# Patient Record
Sex: Male | Born: 1945 | Race: White | Hispanic: No | Marital: Married | Smoking: Never smoker
Health system: Southern US, Community
[De-identification: ages and names within clinical notes are randomized; demographics above are authoritative.]

## PROBLEM LIST (undated history)

## (undated) ENCOUNTER — Inpatient Hospital Stay: Admission: EM | Payer: Self-pay | Source: Home / Self Care

## (undated) DIAGNOSIS — M109 Gout, unspecified: Secondary | ICD-10-CM

## (undated) DIAGNOSIS — M1990 Unspecified osteoarthritis, unspecified site: Secondary | ICD-10-CM

## (undated) DIAGNOSIS — I499 Cardiac arrhythmia, unspecified: Secondary | ICD-10-CM

## (undated) DIAGNOSIS — M549 Dorsalgia, unspecified: Secondary | ICD-10-CM

## (undated) DIAGNOSIS — L0390 Cellulitis, unspecified: Secondary | ICD-10-CM

## (undated) DIAGNOSIS — C189 Malignant neoplasm of colon, unspecified: Secondary | ICD-10-CM

## (undated) DIAGNOSIS — N189 Chronic kidney disease, unspecified: Secondary | ICD-10-CM

## (undated) DIAGNOSIS — C9110 Chronic lymphocytic leukemia of B-cell type not having achieved remission: Secondary | ICD-10-CM

## (undated) DIAGNOSIS — I4891 Unspecified atrial fibrillation: Secondary | ICD-10-CM

## (undated) DIAGNOSIS — R7881 Bacteremia: Secondary | ICD-10-CM

## (undated) DIAGNOSIS — G8929 Other chronic pain: Secondary | ICD-10-CM

## (undated) DIAGNOSIS — I10 Essential (primary) hypertension: Secondary | ICD-10-CM

## (undated) DIAGNOSIS — M14679 Charcot's joint, unspecified ankle and foot: Secondary | ICD-10-CM

## (undated) DIAGNOSIS — M19079 Primary osteoarthritis, unspecified ankle and foot: Secondary | ICD-10-CM

## (undated) HISTORY — PX: CYSTOSCOPY WITH URETHRAL DILATATION: SHX5125

## (undated) HISTORY — PX: COLON SURGERY: SHX602

## (undated) HISTORY — DX: Chronic lymphocytic leukemia of B-cell type not having achieved remission: C91.10

## (undated) HISTORY — PX: BACK SURGERY: SHX140

## (undated) HISTORY — PX: TONSILLECTOMY: SUR1361

## (undated) HISTORY — PX: DIALYSIS FISTULA CREATION: SHX611

## (undated) HISTORY — DX: Gout, unspecified: M10.9

## (undated) HISTORY — PX: FOOT SURGERY: SHX648

---

## 2003-10-07 ENCOUNTER — Ambulatory Visit (HOSPITAL_COMMUNITY): Admission: RE | Admit: 2003-10-07 | Discharge: 2003-10-07 | Payer: Self-pay | Admitting: Internal Medicine

## 2004-03-05 ENCOUNTER — Encounter: Admission: RE | Admit: 2004-03-05 | Discharge: 2004-03-05 | Payer: Self-pay | Admitting: Orthopaedic Surgery

## 2004-03-17 ENCOUNTER — Encounter (INDEPENDENT_AMBULATORY_CARE_PROVIDER_SITE_OTHER): Payer: Self-pay | Admitting: Specialist

## 2004-03-17 ENCOUNTER — Inpatient Hospital Stay (HOSPITAL_COMMUNITY): Admission: RE | Admit: 2004-03-17 | Discharge: 2004-03-18 | Payer: Self-pay | Admitting: Neurosurgery

## 2005-11-04 ENCOUNTER — Ambulatory Visit (HOSPITAL_COMMUNITY): Admission: RE | Admit: 2005-11-04 | Discharge: 2005-11-04 | Payer: Self-pay | Admitting: Ophthalmology

## 2008-07-22 ENCOUNTER — Ambulatory Visit (HOSPITAL_COMMUNITY): Admission: RE | Admit: 2008-07-22 | Discharge: 2008-07-22 | Payer: Self-pay | Admitting: Nephrology

## 2010-05-29 NOTE — Op Note (Signed)
NAMELANDAN, FEDIE NO.:  [id number]   MEDICAL RECORD NO.:  [id number]          PATIENT TYPE:  INP   LOCATION:  2899                         FACILITY:  [facility]   PHYSICIAN:  Clydene Fake, M.D.  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   OPERATION PERFORMED:  Redo decompression of lamina, foraminotomy at L5-S1,  left side, microdissection with microscope, decompressive laminectomy at L3-  L4, microdissection with microscope.   SURGEON:  Clydene Fake, M.D.   ASSISTANT:  Danae Orleans. Venetia Maxon, M.D.   ANESTHESIA:  General endotracheal.   ESTIMATED BLOOD LOSS:  250 mL.   BLOOD REPLACED:  None.   DRAINS:  None.   COMPLICATIONS:  None.   INDICATIONS FOR PROCEDURE:  Patient is a 65 year old gentleman who has had a  few month history of severe back pain radiating to his legs worse on the  left side but also to the right associated with some weakness.  MRI was done  showing severe stenosis at 3-4 with a large disk herniation and spondylitic  changes and some enhancement around some of this disk.  With his history of  colon cancer and chronic intermittent cellulitis/__________  those  etiologies needed to be ruled out.  Also with chronic numbness top of the  foot on the left with severe left foraminal narrowing at 5-1.  Patient  brought in for decompression at all of the above levels.   DESCRIPTION OF PROCEDURE:  The patient was brought to the operating room.  General anesthesia induced.  The patient was placed in the prone position on  the Wilson frame and all pressure points padded.  The patient was prepped  and draped in  sterile fashion.  The site of incision was injected with 20 cc  of 1% lidocaine with epinephrine. An incision was then made at the side of  previous scar which was extended cephalad.  Incision was made on the fascia,  hemostasis was obtained with Bovie cauterization on the left side.  Subperiosteal dissection was done over the L3, 4, and 5 and ___________  facets markers were placed at the 3-4 space, one marker above and one marker  at level below that just to make sure of the markers and x-rays obtained  showing the middle marker was at the 3-4 interspace.  We then  did  subperiosteal dissection of the right side over L3 and 4 ___________  out to  facets.  Self-retaining retractor was placed exposing the 3-4 area.  The  retractor was placed over the 5-1 area on the left.  Laminectomy was then  performed with Leksell rongeurs, high speed drill and Kerrison punches at 3-  4 removing bottom half of the L3 spinous process and lamina, medial facets  and the top of the L4 lamina  and spinous process decompressing the central  canal.  Explored at the disk space.  Large disk herniation was seen.  Subligamentous herniation under a very thickened ligament  that went up  cephalad disk herniation.  The disk space was incised with a 15 blade.  Diskectomy was performed with pituitary rongeurs and curets.  We swept  fragments that were behind the body of L3 down into the disk space and  removed them and diskectomy done to both sides getting good decompression of  the central canal and the bilateral 3 and 4 roots.  Microdissection and  microscope was used during the diskectomy.  Attention was taken to the left  side at L5 and S1 and as we dissected the area we noted that there was a  pars defect at the facet and a loose piece of bone right at the 5 root.  This was removed with a high speed drill and Kerrison punches after  loosening up from scar tissue with nerve hooks.  Kerrison punches were used  to  continue the laminectomy removing the L5 lamina decompression of the  central canal.  The lamina over the 5 root and foraminotomy was done of the  5 root as it came out.  We explored down to the 5-1 disk space and removed  the medial facet at 5-1 and some scar tissue, loosened up scar tissue at S1  root out to the S1 pedicle.  We did decompression there.  When we were  finished, we had good decompression of the S1 and L5 roots.  Wound was  irrigated with antibiotic solution.  Hemostasis was obtained with Gelfoam  and thrombin.  This was then irrigated out and we had good hemostasis.  Retractors were removed. The fascia closed with 0 Vicryl interrupted suture  and the subcutaneous tissue closed with 0, 2-0 and 3-0 Vicryl interrupted  suture and skin closed with Benzoin and Steri-Strips.  Dressing was placed.  Patient placed back to supine position, awakened from anesthesia,  transferred to recovery room in stable condition.      JRH/MEDQ  D:  03/17/2004  T:  03/17/2004  Job:  [id number]

## 2010-05-29 NOTE — Op Note (Signed)
NAME:  Alex Petty, Alex Petty             ACCOUNT NO.:  [id number]   MEDICAL RECORD NO.:  [id number]          PATIENT TYPE:  AMB   LOCATION:  DAY                           FACILITY:  [facility]   PHYSICIAN:  R. Roetta Sessions, M.D. DATE OF BIRTH:  [birth year]   DATE OF PROCEDURE:  10/07/2003  DATE OF DISCHARGE:                                 OPERATIVE REPORT   PROCEDURE:  Colonoscopy with biopsy.   INDICATION FOR PROCEDURE:  The patient is a 65 year old gentleman referred  out of the courtesy of Dr. Sherril Croon in [place] for colorectal cancer screening.  Mr. Goldring has never had a colonoscopy.  There is no family history of  colorectal neoplasia.  He tells me he has passed a small amount of blood on  occasion.  Colonoscopy is now being done primarily as a screening maneuver.  This approach has been discussed with the patient at length.  Potential  risks, benefits, and alternatives have been reviewed, questions answered.  He is agreeable.  Please see documentation in the medical record.   PROCEDURE NOTE:  O2 saturation, blood pressure, pulse, and respirations were  monitored throughout the entire procedure.   CONSCIOUS SEDATION:  Versed 3 mg IV, Demerol 75 mg IV in divided doses.   INSTRUMENT USED:  Olympus video chip system.   FINDINGS:  Digital exam revealed no abnormalities.   ENDOSCOPIC FINDINGS:  Prep was good.   Rectum:  Examination of the rectal mucosa, including retroflexed view of the  anal verge, revealed no abnormalities.   Colon:  The colonic mucosa was surveyed from the rectosigmoid junction  through the left, transverse, right colon, to the area of the appendiceal  orifice, ileocecal valve, and cecum.  These structures were well-seen and  photographed for the record.  The scope was slowly withdrawn and all  previously-mentioned mucosal surfaces were once again seen.   BEGINNING AT 25 CM, THERE WAS A 5 X 6 CM SEMILUNAR EXOPHYTIC-APPEARING  NEOPLASTIC PROCESS (6 cm in longitudinal  length, please see photos).  It was  hard and biopsy pieces came off in large chunks.  The remainder of the  colonic mucosa appeared normal except for scattered sigmoid diverticula.  The patient tolerated the procedure well and was reacted in endoscopy.   IMPRESSION:  1.  Normal rectum.  2.  Sigmoid diverticula.  3.  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  4.  Remainder of colonic mucosa appeared normal.   RECOMMENDATIONS:  1.  CEA, CBC, CHEM-20.  Follow up on biopsies.  2.  Surgical consultation.      RMR/MEDQ  D:  10/07/2003  T:  10/07/2003  Job:  [id number]   cc:   Doreen Beam  [address]  [place]  [place] [address]  Fax: [phone]

## 2010-10-15 ENCOUNTER — Encounter: Payer: Self-pay | Admitting: *Deleted

## 2010-10-15 ENCOUNTER — Inpatient Hospital Stay (HOSPITAL_COMMUNITY)
Admission: EM | Admit: 2010-10-15 | Discharge: 2010-10-19 | DRG: 603 | Disposition: A | Discharge status: Home / Self Care | Payer: Medicare Other | Attending: Internal Medicine | Admitting: Internal Medicine

## 2010-10-15 DIAGNOSIS — N183 Chronic kidney disease, stage 3 unspecified: Secondary | ICD-10-CM | POA: Diagnosis present

## 2010-10-15 DIAGNOSIS — M1990 Unspecified osteoarthritis, unspecified site: Secondary | ICD-10-CM

## 2010-10-15 DIAGNOSIS — N179 Acute kidney failure, unspecified: Secondary | ICD-10-CM | POA: Diagnosis present

## 2010-10-15 DIAGNOSIS — Z85038 Personal history of other malignant neoplasm of large intestine: Secondary | ICD-10-CM

## 2010-10-15 DIAGNOSIS — L02419 Cutaneous abscess of limb, unspecified: Principal | ICD-10-CM | POA: Diagnosis present

## 2010-10-15 DIAGNOSIS — I890 Lymphedema, not elsewhere classified: Secondary | ICD-10-CM

## 2010-10-15 DIAGNOSIS — A5211 Tabes dorsalis: Secondary | ICD-10-CM | POA: Diagnosis present

## 2010-10-15 DIAGNOSIS — I872 Venous insufficiency (chronic) (peripheral): Secondary | ICD-10-CM | POA: Diagnosis present

## 2010-10-15 DIAGNOSIS — I129 Hypertensive chronic kidney disease with stage 1 through stage 4 chronic kidney disease, or unspecified chronic kidney disease: Secondary | ICD-10-CM | POA: Diagnosis present

## 2010-10-15 DIAGNOSIS — Z6841 Body Mass Index (BMI) 40.0 and over, adult: Secondary | ICD-10-CM

## 2010-10-15 DIAGNOSIS — N19 Unspecified kidney failure: Secondary | ICD-10-CM

## 2010-10-15 DIAGNOSIS — L03116 Cellulitis of left lower limb: Secondary | ICD-10-CM

## 2010-10-15 HISTORY — DX: Unspecified osteoarthritis, unspecified site: M19.90

## 2010-10-15 HISTORY — DX: Chronic kidney disease, unspecified: N18.9

## 2010-10-15 HISTORY — DX: Primary osteoarthritis, unspecified ankle and foot: M19.079

## 2010-10-15 HISTORY — DX: Malignant neoplasm of colon, unspecified: C18.9

## 2010-10-15 HISTORY — DX: Essential (primary) hypertension: I10

## 2010-10-15 HISTORY — DX: Cellulitis, unspecified: L03.90

## 2010-10-15 LAB — CBC
HCT: 40.3 % (ref 39.0–52.0)
MCV: 87 fL (ref 78.0–100.0)
Platelets: 140 10*3/uL — ABNORMAL LOW (ref 150–400)
RDW: 18.2 % — ABNORMAL HIGH (ref 11.5–15.5)

## 2010-10-15 LAB — BASIC METABOLIC PANEL
BUN: 38 mg/dL — ABNORMAL HIGH (ref 6–23)
CO2: 30 mEq/L (ref 19–32)
Creatinine, Ser: 2.68 mg/dL — ABNORMAL HIGH (ref 0.50–1.35)
GFR calc Af Amer: 27 mL/min — ABNORMAL LOW (ref >90)
Glucose, Bld: 134 mg/dL — ABNORMAL HIGH (ref 70–99)

## 2010-10-15 LAB — HEPATIC FUNCTION PANEL
Albumin: 3.6 g/dL (ref 3.5–5.2)
Bilirubin, Direct: 0.4 mg/dL — ABNORMAL HIGH (ref 0.0–0.3)
Total Bilirubin: 1.1 mg/dL (ref 0.3–1.2)

## 2010-10-15 LAB — DIFFERENTIAL
Eosinophils Relative: 0 % (ref 0–5)
Lymphocytes Relative: 15 % (ref 12–46)
Lymphs Abs: 1.9 10*3/uL (ref 0.7–4.0)
Monocytes Relative: 3 % (ref 3–12)
Neutro Abs: 10.6 10*3/uL — ABNORMAL HIGH (ref 1.7–7.7)
Neutrophils Relative %: 83 % — ABNORMAL HIGH (ref 43–77)

## 2010-10-15 LAB — URINALYSIS, ROUTINE W REFLEX MICROSCOPIC
Glucose, UA: NEGATIVE mg/dL
Hgb urine dipstick: NEGATIVE
Specific Gravity, Urine: >1.03 — ABNORMAL HIGH (ref 1.005–1.030)

## 2010-10-15 LAB — PHOSPHORUS: Phosphorus: 2.9 mg/dL (ref 2.3–4.6)

## 2010-10-15 LAB — PROTIME-INR: Prothrombin Time: 16.2 seconds — ABNORMAL HIGH (ref 11.6–15.2)

## 2010-10-15 LAB — APTT: aPTT: 35 seconds (ref 24–37)

## 2010-10-15 MED ORDER — VANCOMYCIN HCL IN DEXTROSE 1-5 GM/200ML-% IV SOLN
1000.0000 mg | Freq: Once | INTRAVENOUS | Status: AC
  Administered 2010-10-15: 1000 mg via INTRAVENOUS
  Filled 2010-10-15: qty 200

## 2010-10-15 MED ORDER — BISACODYL 10 MG RE SUPP: 10.0000 mg | RECTAL | Status: DC | PRN

## 2010-10-15 MED ORDER — SODIUM CHLORIDE 0.9 % IV SOLN
INTRAVENOUS | Status: DC
  Administered 2010-10-15 – 2010-10-17 (×3): via INTRAVENOUS
  Filled 2010-10-15 (×8): qty 1000

## 2010-10-15 MED ORDER — HYDROMORPHONE HCL 1 MG/ML IJ SOLN
0.5000 mg | INTRAMUSCULAR | Status: DC | PRN
  Administered 2010-10-15 (×2): 0.5 mg via INTRAVENOUS
  Filled 2010-10-15 (×2): qty 1

## 2010-10-15 MED ORDER — ENOXAPARIN SODIUM 30 MG/0.3ML ~~LOC~~ SOLN
30.0000 mg | Freq: Every day | SUBCUTANEOUS | Status: DC
  Administered 2010-10-15: 30 mg via SUBCUTANEOUS
  Filled 2010-10-15: qty 0.3

## 2010-10-15 MED ORDER — OXYCODONE HCL 5 MG PO TABS
5.0000 mg | ORAL_TABLET | ORAL | Status: DC | PRN
  Administered 2010-10-15 – 2010-10-18 (×3): 5 mg via ORAL
  Filled 2010-10-15 (×3): qty 1

## 2010-10-15 MED ORDER — FENOFIBRATE 54 MG PO TABS
54.0000 mg | ORAL_TABLET | Freq: Every day | ORAL | Status: DC
  Administered 2010-10-15 – 2010-10-19 (×5): 54 mg via ORAL
  Filled 2010-10-15 (×10): qty 1

## 2010-10-15 MED ORDER — FEBUXOSTAT 40 MG PO TABS
40.0000 mg | ORAL_TABLET | Freq: Every day | ORAL | Status: DC
  Administered 2010-10-15 – 2010-10-19 (×5): 40 mg via ORAL
  Filled 2010-10-15 (×7): qty 1

## 2010-10-15 MED ORDER — FOLIC ACID 1 MG PO TABS
1.0000 mg | ORAL_TABLET | Freq: Every day | ORAL | Status: DC
  Administered 2010-10-15 – 2010-10-19 (×5): 1 mg via ORAL
  Filled 2010-10-15 (×5): qty 1

## 2010-10-15 MED ORDER — POLYETHYLENE GLYCOL 3350 17 G PO PACK: 17.0000 g | PACK | Freq: Every day | ORAL | Status: DC | PRN

## 2010-10-15 MED ORDER — SODIUM CHLORIDE 0.9 % IJ SOLN
INTRAMUSCULAR | Status: AC
  Administered 2010-10-15: 15:00:00
  Filled 2010-10-15: qty 10

## 2010-10-15 MED ORDER — ACETAMINOPHEN 650 MG RE SUPP: 650.0000 mg | Freq: Four times a day (QID) | RECTAL | Status: DC | PRN

## 2010-10-15 MED ORDER — DILTIAZEM HCL ER COATED BEADS 120 MG PO CP24
120.0000 mg | ORAL_CAPSULE | Freq: Every day | ORAL | Status: DC
  Administered 2010-10-15 – 2010-10-17 (×3): 120 mg via ORAL
  Filled 2010-10-15 (×4): qty 1

## 2010-10-15 MED ORDER — TRAZODONE HCL 50 MG PO TABS: 25.0000 mg | ORAL_TABLET | Freq: Every evening | ORAL | Status: DC | PRN

## 2010-10-15 MED ORDER — DOXAZOSIN MESYLATE 2 MG PO TABS
4.0000 mg | ORAL_TABLET | Freq: Every day | ORAL | Status: DC
  Administered 2010-10-15 – 2010-10-18 (×4): 4 mg via ORAL
  Filled 2010-10-15 (×4): qty 2

## 2010-10-15 MED ORDER — ONDANSETRON HCL 4 MG PO TABS: 4.0000 mg | ORAL_TABLET | Freq: Four times a day (QID) | ORAL | Status: DC | PRN

## 2010-10-15 MED ORDER — ASPIRIN EC 81 MG PO TBEC
81.0000 mg | DELAYED_RELEASE_TABLET | Freq: Every day | ORAL | Status: DC
  Administered 2010-10-15 – 2010-10-19 (×5): 81 mg via ORAL
  Filled 2010-10-15 (×5): qty 1

## 2010-10-15 MED ORDER — ACETAMINOPHEN 325 MG PO TABS
650.0000 mg | ORAL_TABLET | Freq: Four times a day (QID) | ORAL | Status: DC | PRN
  Administered 2010-10-15 – 2010-10-19 (×2): 650 mg via ORAL
  Filled 2010-10-15 (×3): qty 2

## 2010-10-15 MED ORDER — PNEUMOCOCCAL VAC POLYVALENT 25 MCG/0.5ML IJ INJ
0.5000 mL | INJECTION | INTRAMUSCULAR | Status: AC
  Administered 2010-10-16: 0.5 mL via INTRAMUSCULAR
  Filled 2010-10-15: qty 0.5

## 2010-10-15 MED ORDER — PROMETHAZINE HCL 25 MG/ML IJ SOLN: 12.5000 mg | Freq: Four times a day (QID) | INTRAMUSCULAR | Status: DC | PRN

## 2010-10-15 MED ORDER — ONDANSETRON HCL 4 MG/2ML IJ SOLN
4.0000 mg | Freq: Four times a day (QID) | INTRAMUSCULAR | Status: DC | PRN
  Administered 2010-10-15 – 2010-10-18 (×3): 4 mg via INTRAVENOUS
  Filled 2010-10-15 (×3): qty 2

## 2010-10-15 MED ORDER — PIPERACILLIN-TAZOBACTAM 3.375 G IVPB
3.3750 g | Freq: Three times a day (TID) | INTRAVENOUS | Status: DC
  Administered 2010-10-15 – 2010-10-19 (×12): 3.375 g via INTRAVENOUS
  Filled 2010-10-15 (×13): qty 50

## 2010-10-15 MED ORDER — PROMETHAZINE HCL 12.5 MG PO TABS: 12.5000 mg | ORAL_TABLET | Freq: Four times a day (QID) | ORAL | Status: DC | PRN

## 2010-10-15 MED ORDER — TORSEMIDE 20 MG PO TABS
20.0000 mg | ORAL_TABLET | Freq: Two times a day (BID) | ORAL | Status: DC
  Administered 2010-10-15 – 2010-10-16 (×2): 20 mg via ORAL
  Filled 2010-10-15 (×2): qty 1

## 2010-10-15 MED ORDER — VANCOMYCIN HCL 1000 MG IV SOLR
1500.0000 mg | INTRAVENOUS | Status: DC
  Administered 2010-10-15 – 2010-10-18 (×4): 1500 mg via INTRAVENOUS
  Filled 2010-10-15 (×5): qty 1500

## 2010-10-15 MED ORDER — SODIUM CHLORIDE 0.9 % IV SOLN
INTRAVENOUS | Status: AC
  Administered 2010-10-15: 75 mL via INTRAVENOUS

## 2010-10-15 MED ORDER — FLORA-Q PO CAPS
2.0000 | ORAL_CAPSULE | Freq: Two times a day (BID) | ORAL | Status: DC
  Administered 2010-10-15 – 2010-10-19 (×9): 2 via ORAL
  Filled 2010-10-15 (×2): qty 2
  Filled 2010-10-15: qty 1
  Filled 2010-10-15 (×3): qty 2
  Filled 2010-10-15: qty 1
  Filled 2010-10-15 (×3): qty 2

## 2010-10-15 NOTE — ED Notes (Signed)
Pt has severe edema and redness on left lower leg.  Moderate edema on right.  Reports that he was to begin at wound care clinic, but PCP office hasn't scheduled yet.  Reports cramps in left leg, radiating up to groin area.  Reports only one episode of nausea, no vomiting.  Cap refill in toes <3 sec.  Pt did have one episode of diarrhea upon arriving to ED.  Reports he's been on Keflex for past 3 weeks.

## 2010-10-15 NOTE — ED Provider Notes (Signed)
History     CSN: [id number] Arrival date & time: 10/15/2010  4:34 AM  Chief Complaint  Patient presents with  . Leg Pain    (Consider location/radiation/quality/duration/timing/severity/associated sxs/prior treatment) HPI Comments: Patient is a pleasant 65 year old male with a history of hypertension, Charcot joints, recurrent cellulitis in his lower extremities who presents with several days of worsening redness and swelling in the left lower extremity. He states that he has just finished a prescription of Keflex medicine for an infection in that same leg. In the last 24 hours she has developed fevers and chills and has been sweating profusely. Symptoms are constant, gradually getting worse, nothing makes better or worse. He denies vomiting but has had nausea overnight.  Patient is a 65 y.o. male presenting with leg pain. The history is provided by the patient, the spouse and a relative.  Leg Pain     Past Medical History  Diagnosis Date  . Arthritis   . Hypertension   . Colon cancer     Past Surgical History  Procedure Date  . Back surgery   . Foot surgery     History reviewed. No pertinent family history.  History  Substance Use Topics  . Smoking status: Never Smoker   . Smokeless tobacco: Not on file  . Alcohol Use: No      Review of Systems  All other systems reviewed and are negative.    Allergies  Ace inhibitors; Beta adrenergic blockers; and Rocephin  Home Medications   Current Outpatient Rx  Name Route Sig Dispense Refill  . CHONDROITIN SULFATE 250 MG PO CAPS Oral Take by mouth.      . DICLOFENAC SODIUM 50 MG PO TBEC Oral Take 100 mg by mouth 2 (two) times daily.      Marland Kitchen DILTIAZEM HCL COATED BEADS 120 MG PO CP24 Oral Take 120 mg by mouth daily.      Marland Kitchen DOXAZOSIN MESYLATE 4 MG PO TABS Oral Take 4 mg by mouth at bedtime.      . FEBUXOSTAT 40 MG PO TABS Oral Take 40 mg by mouth daily.      . FENOFIBRATE 54 MG PO TABS Oral Take 54 mg by mouth daily.        Marland Kitchen GLUCOSAMINE 1500 COMPLEX PO Oral Take by mouth.      . INDOMETHACIN 50 MG PO CAPS Oral Take 50 mg by mouth 2 (two) times daily with a meal.      . MULTIVITAMINS PO CAPS Oral Take 1 capsule by mouth daily.      . TORSEMIDE 20 MG PO TABS Oral Take 20 mg by mouth daily.        BP 117/61  Pulse 87  Temp(Src) 99.1 F (37.3 C) (Oral)  Resp 20  Ht 6\' 3"  (1.905 m)  Wt 360 lb (163.295 kg)  BMI 45.00 kg/m2  SpO2 93%  Physical Exam  Nursing note and vitals reviewed. Constitutional: He appears well-developed and well-nourished.       Uncomfortable appearing  HENT:  Head: Normocephalic and atraumatic.  Mouth/Throat: Oropharynx is clear and moist.       Mucous membranes mildly dehydrated  Eyes: Conjunctivae and EOM are normal. Pupils are equal, round, and reactive to light. Right eye exhibits no discharge. Left eye exhibits no discharge. No scleral icterus.  Neck: Normal range of motion. Neck supple. No JVD present. No thyromegaly present.  Cardiovascular: Normal rate, regular rhythm, normal heart sounds and intact distal pulses.  Exam reveals no gallop  and no friction rub.   No murmur heard. Pulmonary/Chest: Effort normal and breath sounds normal. No respiratory distress. He has no wheezes. He has no rales.  Abdominal: Soft. Bowel sounds are normal. He exhibits no distension and no mass. There is no tenderness.  Musculoskeletal: He exhibits edema and tenderness (Left lower extremity redness, induration, swelling, warmth from the knee down. Erythema extends all the way to the hip).       Bilateral lower extremity edema left greater than right..  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  Neurological: He is alert. Coordination normal.  Skin: Skin is warm and dry. Rash noted. There is erythema.  Psychiatric: He has a normal mood and affect. His behavior is normal.    ED Course  Procedures (including critical care  time)     MDM  Significant cellulitis of the left lower extremity extending from the foot all the way up to the hip. Primarily involving the lower extremity below the knee. Temperature is 99.1 orally, pulse 87, blood pressure 117/61. Will start IV antibiotics and anticipate admission.  Discussed with Dr. Orvan Falconer of [facility] - the CBC shows elevated WBC and the BMP shows Cr of 2.5+.  Willa dmit.  Results for orders placed during the hospital encounter of 10/15/10  CBC      Component Value Range   WBC 12.8 (*) 4.0 - 10.5 (K/uL)   RBC 4.63  4.22 - 5.81 (MIL/uL)   Hemoglobin 12.8 (*) 13.0 - 17.0 (g/dL)   HCT 16.1  09.6 - 04.5 (%)   MCV 87.0  78.0 - 100.0 (fL)   MCH 27.6  26.0 - 34.0 (pg)   MCHC 31.8  30.0 - 36.0 (g/dL)   RDW 40.9 (*) 81.1 - 15.5 (%)   Platelets 140 (*) 150 - 400 (K/uL)  DIFFERENTIAL      Component Value Range   Neutrophils Relative 83 (*) 43 - 77 (%)   Neutro Abs 10.6 (*) 1.7 - 7.7 (K/uL)   Lymphocytes Relative 15  12 - 46 (%)   Lymphs Abs 1.9  0.7 - 4.0 (K/uL)   Monocytes Relative 3  3 - 12 (%)   Monocytes Absolute 0.3  0.1 - 1.0 (K/uL)   Eosinophils Relative 0  0 - 5 (%)   Eosinophils Absolute 0.0  0.0 - 0.7 (K/uL)   Basophils Relative 0  0 - 1 (%)   Basophils Absolute 0.0  0.0 - 0.1 (K/uL)  BASIC METABOLIC PANEL      Component Value Range   Sodium 138  135 - 145 (mEq/L)   Potassium 4.2  3.5 - 5.1 (mEq/L)   Chloride 98  96 - 112 (mEq/L)   CO2 30  19 - 32 (mEq/L)   Glucose, Bld 134 (*) 70 - 99 (mg/dL)   BUN 38 (*) 6 - 23 (mg/dL)   Creatinine, Ser 9.14 (*) 0.50 - 1.35 (mg/dL)   Calcium 8.9  8.4 - 78.2 (mg/dL)   GFR calc non Af Amer 23 (*) >90 (mL/min)   GFR calc Af Amer 27 (*) >90 (mL/min)   No results found.         Vida Roller, MD 10/15/10 [phone]

## 2010-10-15 NOTE — Consult Note (Signed)
Reason for Consult: Chronic renal failure Referring Physician: [facility]osptalist group  Alex Petty is an 65 y.o. male.  HPI: He is a patient with chronic renal failure baseline creatinine about 2.3 for the last 2 years and presently had came because of swelling of the legs and the also cellulitis. Patient was initially seen by his primary care physician and that he was treated with Keflex without much improvement. Presently patient complains of still swelling of the legs which has been there for many years secondary to live edema and also nonhealing cellulitis. At this moment patient doesn't have any nausea vomiting. He has occasional difficulty breathing mainly exertional. The etiology for his renal failure was thought to be a combination of nonsteroidal-induced renal failure class focal segmental nephrosclerosis related to kidney stone and possibly also obstructive uropathy related to urethral stricture. Since patient has elevated ANA double-stranded DNA was checked which was high however patient lost for followup he is now sure whether he has underlying lupus nephritis also.  Past Medical History  Diagnosis Date  . Arthritis   . Hypertension   . Colon cancer   . Chronic kidney disease   . DJD (degenerative joint disease)   . DJD (degenerative joint disease), ankle and foot   . Arthritis   . Cellulitis   . Arthritis     Past Surgical History  Procedure Date  . Back surgery   . Foot surgery   . Colon surgery   . Tonsillectomy   . Cystoscopy with urethral dilatation     History reviewed. No pertinent family history.  Social History:  reports that he has never smoked. He does not have any smokeless tobacco history on file. He reports that he does not drink alcohol or use illicit drugs.  Allergies:  Allergies  Allergen Reactions  . Ace Inhibitors Other (See Comments)    cough  . Beta Adrenergic Blockers Other (See Comments)    cough  . Rocephin (Ceftriaxone Sodium) Hives     Medications: I have reviewed the patient's current medications.  Results for orders placed during the hospital encounter of 10/15/10 (from the past 48 hour(s))  CBC     Status: Abnormal   Collection Time   10/15/10  4:54 AM      Component Value Range Comment   WBC 12.8 (*) 4.0 - 10.5 (K/uL)    RBC 4.63  4.22 - 5.81 (MIL/uL)    Hemoglobin 12.8 (*) 13.0 - 17.0 (g/dL)    HCT 82.9  56.2 - 13.0 (%)    MCV 87.0  78.0 - 100.0 (fL)    MCH 27.6  26.0 - 34.0 (pg)    MCHC 31.8  30.0 - 36.0 (g/dL)    RDW 86.5 (*) 78.4 - 15.5 (%)    Platelets 140 (*) 150 - 400 (K/uL)   DIFFERENTIAL     Status: Abnormal   Collection Time   10/15/10  4:54 AM      Component Value Range Comment   Neutrophils Relative 83 (*) 43 - 77 (%)    Neutro Abs 10.6 (*) 1.7 - 7.7 (K/uL)    Lymphocytes Relative 15  12 - 46 (%)    Lymphs Abs 1.9  0.7 - 4.0 (K/uL)    Monocytes Relative 3  3 - 12 (%)    Monocytes Absolute 0.3  0.1 - 1.0 (K/uL)    Eosinophils Relative 0  0 - 5 (%)    Eosinophils Absolute 0.0  0.0 - 0.7 (K/uL)  Basophils Relative 0  0 - 1 (%)    Basophils Absolute 0.0  0.0 - 0.1 (K/uL)   BASIC METABOLIC PANEL     Status: Abnormal   Collection Time   10/15/10  4:54 AM      Component Value Range Comment   Sodium 138  135 - 145 (mEq/L)    Potassium 4.2  3.5 - 5.1 (mEq/L)    Chloride 98  96 - 112 (mEq/L)    CO2 30  19 - 32 (mEq/L)    Glucose, Bld 134 (*) 70 - 99 (mg/dL)    BUN 38 (*) 6 - 23 (mg/dL)    Creatinine, Ser 1.61 (*) 0.50 - 1.35 (mg/dL)    Calcium 8.9  8.4 - 10.5 (mg/dL)    GFR calc non Af Amer 23 (*) >90 (mL/min)    GFR calc Af Amer 27 (*) >90 (mL/min)   HEPATIC FUNCTION PANEL     Status: Abnormal   Collection Time   10/15/10  4:54 AM      Component Value Range Comment   Total Protein 6.9  6.0 - 8.3 (g/dL)    Albumin 3.6  3.5 - 5.2 (g/dL)    AST 21  0 - 37 (U/L)    ALT 21  0 - 53 (U/L)    Alkaline Phosphatase 49  39 - 117 (U/L)    Total Bilirubin 1.1  0.3 - 1.2 (mg/dL)    Bilirubin,  Direct 0.4 (*) 0.0 - 0.3 (mg/dL)    Indirect Bilirubin 0.7  0.3 - 0.9 (mg/dL)   PHOSPHORUS     Status: Normal   Collection Time   10/15/10  4:54 AM      Component Value Range Comment   Phosphorus 2.9  2.3 - 4.6 (mg/dL)   APTT     Status: Normal   Collection Time   10/15/10  4:54 AM      Component Value Range Comment   aPTT 35  24 - 37 (seconds)   PROTIME-INR     Status: Abnormal   Collection Time   10/15/10  4:54 AM      Component Value Range Comment   Prothrombin Time 16.2 (*) 11.6 - 15.2 (seconds)    INR 1.27  0.00 - 1.49    MRSA PCR SCREENING     Status: Normal   Collection Time   10/15/10  8:18 AM      Component Value Range Comment   MRSA by PCR NEGATIVE  NEGATIVE      No results found.  Review of Systems  Constitutional: Positive for weight loss.  Respiratory: Negative for cough, sputum production and shortness of breath.   Cardiovascular: Positive for leg swelling. Negative for chest pain, orthopnea and PND.  Gastrointestinal: Negative for nausea, vomiting and abdominal pain.  Genitourinary: Positive for frequency. Negative for urgency.  Musculoskeletal: Positive for back pain.  Neurological: Positive for weakness.   Blood pressure 127/68, pulse 80, temperature 98.7 F (37.1 C), temperature source Oral, resp. rate 20, height 6\' 3"  (1.905 m), weight 163.295 kg (360 lb), SpO2 94.00%. Physical Exam  Constitutional: He is oriented to person, place, and time.  Eyes: No scleral icterus.  Neck: No JVD present.  Cardiovascular: Normal rate, regular rhythm and normal heart sounds.  Exam reveals no gallop.   No murmur heard. Respiratory: No respiratory distress. He has no wheezes.  GI: He exhibits distension. There is no guarding.  Neurological: He is alert and oriented to person, place, and time.  Skin: There is erythema. No pallor.    Assessment/Plan: Problem #1 renal failure chronic at this moment seems to be stable. Patient doesn't have any uremic sinus symptoms. The  etiology as stated above seems to be multifactorial including secondary to nonsteroidal[ Indocin] versus a focal segmental nephrosclerosis related to obstructive uropathy and kidney stone. Problem #2 history of cellulitis presently patient is on antibiotics Problem #3 history of  degenerative joint disease status post back surgery x2. Problem #4 history of gout patient has been treated with his disorder for many years and presently he has been taking indomethacin. 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  Recommendation I agree with the discontinuation of his indomethacin We'll repeat his ANA, anti-double-stranded DNA, and anti-Smith antibodies  We'll continue his diuretics We'll check her his present metabolic panel and follow patient. His renal function gets worse probably would do further workup.  Dayzha Pogosyan S 10/15/2010, 12:16 PM    and

## 2010-10-15 NOTE — Progress Notes (Signed)
Subjective: This man, who is morbidly obese, was admitted with left leg cellulitis and also renal failure. Unfortunately, we do not know his baseline creatinine but he does tell me he has seen Dr Alex Petty in the past.           Physical Exam: Blood pressure 127/68, pulse 80, temperature 98.7 F (37.1 C), temperature source Oral, resp. rate 20, height 6\' 3"  (1.905 m), weight 163.295 kg (360 lb), SpO2 94.00%. He looks systemically well. Is not toxic or septic. He is morbidly obese. He clearly has left leg cellulitis tracking up to his mid thigh on the left side. Heart sounds are present and normal without murmurs. Lung fields are clear. He is alert and orientated without any focal neurological signs.   Investigations: Results for orders placed during the hospital encounter of 10/15/10 (from the past 48 hour(s))  CBC     Status: Abnormal   Collection Time   10/15/10  4:54 AM      Component Value Range Comment   WBC 12.8 (*) 4.0 - 10.5 (K/uL)    RBC 4.63  4.22 - 5.81 (MIL/uL)    Hemoglobin 12.8 (*) 13.0 - 17.0 (g/dL)    HCT 19.1  47.8 - 29.5 (%)    MCV 87.0  78.0 - 100.0 (fL)    MCH 27.6  26.0 - 34.0 (pg)    MCHC 31.8  30.0 - 36.0 (g/dL)    RDW 62.1 (*) 30.8 - 15.5 (%)    Platelets 140 (*) 150 - 400 (K/uL)   DIFFERENTIAL     Status: Abnormal   Collection Time   10/15/10  4:54 AM      Component Value Range Comment   Neutrophils Relative 83 (*) 43 - 77 (%)    Neutro Abs 10.6 (*) 1.7 - 7.7 (K/uL)    Lymphocytes Relative 15  12 - 46 (%)    Lymphs Abs 1.9  0.7 - 4.0 (K/uL)    Monocytes Relative 3  3 - 12 (%)    Monocytes Absolute 0.3  0.1 - 1.0 (K/uL)    Eosinophils Relative 0  0 - 5 (%)    Eosinophils Absolute 0.0  0.0 - 0.7 (K/uL)    Basophils Relative 0  0 - 1 (%)    Basophils Absolute 0.0  0.0 - 0.1 (K/uL)   BASIC METABOLIC PANEL     Status: Abnormal   Collection Time   10/15/10  4:54 AM      Component Value Range Comment   Sodium 138  135 - 145 (mEq/L)    Potassium 4.2   3.5 - 5.1 (mEq/L)    Chloride 98  96 - 112 (mEq/L)    CO2 30  19 - 32 (mEq/L)    Glucose, Bld 134 (*) 70 - 99 (mg/dL)    BUN 38 (*) 6 - 23 (mg/dL)    Creatinine, Ser 6.57 (*) 0.50 - 1.35 (mg/dL)    Calcium 8.9  8.4 - 10.5 (mg/dL)    GFR calc non Af Amer 23 (*) >90 (mL/min)    GFR calc Af Amer 27 (*) >90 (mL/min)   HEPATIC FUNCTION PANEL     Status: Abnormal   Collection Time   10/15/10  4:54 AM      Component Value Range Comment   Total Protein 6.9  6.0 - 8.3 (g/dL)    Albumin 3.6  3.5 - 5.2 (g/dL)    AST 21  0 - 37 (U/L)    ALT 21  0 - 53 (U/L)    Alkaline Phosphatase 49  39 - 117 (U/L)    Total Bilirubin 1.1  0.3 - 1.2 (mg/dL)    Bilirubin, Direct 0.4 (*) 0.0 - 0.3 (mg/dL)    Indirect Bilirubin 0.7  0.3 - 0.9 (mg/dL)   PHOSPHORUS     Status: Normal   Collection Time   10/15/10  4:54 AM      Component Value Range Comment   Phosphorus 2.9  2.3 - 4.6 (mg/dL)   APTT     Status: Normal   Collection Time   10/15/10  4:54 AM      Component Value Range Comment   aPTT 35  24 - 37 (seconds)   PROTIME-INR     Status: Abnormal   Collection Time   10/15/10  4:54 AM      Component Value Range Comment   Prothrombin Time 16.2 (*) 11.6 - 15.2 (seconds)    INR 1.27  0.00 - 1.49    MRSA PCR SCREENING     Status: Normal   Collection Time   10/15/10  8:18 AM      Component Value Range Comment   MRSA by PCR NEGATIVE  NEGATIVE     Recent Results (from the past 240 hour(s))  MRSA PCR SCREENING     Status: Normal   Collection Time   10/15/10  8:18 AM      Component Value Range Status Comment   MRSA by PCR NEGATIVE  NEGATIVE  Final         Medications: I have reviewed the patient's current medications.  Impression: 1. Left leg cellulitis. 2. Acute on chronic kidney disease. 3. Morbid obesity. 4. Lymphedema of the lower extremity. 5. Remote history of colon cancer.     Plan: 1. Continue intravenous fluids and intravenous antibiotics. 2. Monitor renal function.     LOS: 0 days    Alex Petty C 10/15/2010, 11:33 AM

## 2010-10-15 NOTE — Progress Notes (Signed)
Physical Therapy Evaluation Patient Name: Alex Petty Date: 10/15/2010 Time: 13:05-13:29 Charges: 1 EV  Problem List:  Patient Active Problem List  Diagnoses  . Cellulitis of leg, left  . Acute on chronic kidney disease, stage 3  . Morbid obesity  . DJD (degenerative joint disease)  . Lymphedema of lower extremity   Past Medical History:  Past Medical History  Diagnosis Date  . Arthritis   . Hypertension   . Colon cancer   . Chronic kidney disease   . DJD (degenerative joint disease)   . DJD (degenerative joint disease), ankle and foot   . Arthritis   . Cellulitis   . Arthritis    Past Surgical History:  Past Surgical History  Procedure Date  . Back surgery   . Foot surgery   . Colon surgery   . Tonsillectomy   . Cystoscopy with urethral dilatation     Precautions/Restrictions    Prior Functioning  Home Living Lives With: Spouse Receives Help From: Family (wife) Prior Function Level of Independence: Requires assistive device for independence Cognition   Sensation/Coordination   Extremity Assessment RLE Assessment RLE Assessment: Exceptions to [facility] RLE Strength RLE Overall Strength: Deficits RLE Overall Strength Comments: 4/5 LLE Assessment LLE Assessment: Exceptions to [facility] LLE Strength LLE Overall Strength: Deficits LLE Overall Strength Comments: 4/5 Mobility (including Balance) Bed Mobility Bed Mobility: Yes Supine to Sit: 6: Modified independent (Device/Increase time) (using bedrail. ) Sitting - Scoot to Edge of Bed: 7: Independent Sit to Supine - Right: 6: Modified independent (Device/Increase time) (using bedrail.  ) Transfers Transfers: Yes Sit to Stand: 5: Supervision Sit to Stand Details (indicate cue type and reason): for safety secondary to patient using momentum to get up OOB.   Stand to Sit: 5: Supervision Stand to Sit Details: uncontrolled "plop" descent to sit.   Ambulation/Gait Ambulation/Gait: Yes Ambulation/Gait  Assistance: 4: Min assist Ambulation/Gait Assistance Details (indicate cue type and reason): min gard assist for safety secondary to patient using both his cane and the rail in the hallway for support.  He states that he can't use a RW because his feet which are both externally rotated hit the walker and trips him.   Ambulation Distance (Feet): 80 Feet Assistive device: Straight cane (and hallway rail  ) Gait Pattern:  (bilateral feet externally rotated, staggering gait pattern. )    Exercise     End of Session PT - End of Session Equipment Utilized During Treatment: Gait belt;Other (comment) (cane, and patient's orthopedic shoes.  ) Activity Tolerance: Patient limited by fatigue;Other (comment) (limited by fatigue and dyspnea.  ) Patient left: in bed;with call bell in reach;with family/visitor present (wife) Nurse Communication: Mobility status for ambulation General Behavior During Session: [facility] for tasks performed Cognition: [facility] for tasks performed PT Assessment/Plan/Recommendation PT Assessment Clinical Impression Statement: 65 y.o. male presents to [facility] with L leg cellulitis, fevers, chills, and cramps in the left leg.  The patient demonstrates decreased strength, endurance, balance and decreased mobililty.  He would benefit from skilled PT to maximize independence, functional mobility and safety so that he may return home safely at discharge with wife's supervision.   PT Recommendation/Assessment: Patient will need skilled PT in the acute care venue PT Problem List: Decreased strength;Decreased activity tolerance;Decreased balance;Decreased mobility;Decreased knowledge of use of DME PT Therapy Diagnosis : Difficulty walking;Abnormality of gait;Generalized weakness PT Plan PT Frequency: Min 3X/week PT Treatment/Interventions: DME instruction;Gait training;Stair training;Functional mobility training;Therapeutic exercise;Balance training;Neuromuscular re-education;Patient/family  education PT Recommendation Follow Up  Recommendations: None Equipment Recommended: None recommended by PT PT Goals  Acute Rehab PT Goals PT Goal Formulation: With patient/family Time For Goal Achievement: 2 weeks Pt will go Supine/Side to Sit: Independently Pt will go Sit to Supine/Side: Independently Pt will Transfer Sit to Stand/Stand to Sit: with modified independence Pt will Transfer Bed to Chair/Chair to Bed: with modified independence Pt will Ambulate: 51 - 150 feet;with supervision Alex Petty, Claretta Fraise 10/15/2010, 3:00 PM

## 2010-10-15 NOTE — ED Notes (Signed)
Patient here for leg wound and leg cramps

## 2010-10-15 NOTE — Plan of Care (Signed)
Problem: Phase II Progression Outcomes Goal: Progress activity as tolerated unless otherwise ordered Outcome: Completed/Met Date Met:  10/15/10 See PT evaluation and progress notes for details.    Goal: Discharge plan established No PT f/u recommended at d/c.  Patient is close to his normal baseline.

## 2010-10-15 NOTE — Consult Note (Signed)
ANTIBIOTIC CONSULT NOTE - INITIAL  Pharmacy Consult for vancomycin and zosyn Indication: cellulitis  Allergies  Allergen Reactions  . Ace Inhibitors   . Beta Adrenergic Blockers   . Rocephin (Ceftriaxone Sodium)    Patient Measurements: Height: 6\' 3"  (190.5 cm) Weight: 360 lb (163.295 kg) IBW/kg (Calculated) : 84.5   Vital Signs: Temp: 99.1 F (37.3 C) (10/04 0425) Temp src: Oral (10/04 0425) BP: 110/64 mmHg (10/04 0617) Pulse Rate: 78  (10/04 0617) Intake/Output from previous day:   Intake/Output from this shift:    Labs:  Basename 10/15/10 0454  WBC 12.8*  HGB 12.8*  PLT 140*  LABCREA --  CREATININE 2.68*   Estimated Creatinine Clearance: 45.1 ml/min (by C-G formula based on Cr of 2.68). No results found for this basename: VANCOTROUGH:2,VANCOPEAK:2,VANCORANDOM:2,GENTTROUGH:2,GENTPEAK:2,GENTRANDOM:2,TOBRATROUGH:2,TOBRAPEAK:2,TOBRARND:2,AMIKACINPEAK:2,AMIKACINTROU:2,AMIKACIN:2, in the last 72 hours   Microbiology: No results found for this or any previous visit (from the past 720 hour(s)).  Medical History: Past Medical History  Diagnosis Date  . Arthritis   . Hypertension   . Colon cancer   . Chronic kidney disease   . DJD (degenerative joint disease)   . DJD (degenerative joint disease), ankle and foot   . Arthritis   . Cellulitis   . Arthritis    Medications:  Scheduled:    . sodium chloride   Intravenous STAT  . aspirin EC  81 mg Oral Daily  . diltiazem  120 mg Oral Daily  . doxazosin  4 mg Oral QHS  . enoxaparin  30 mg Subcutaneous Daily  . febuxostat  40 mg Oral Daily  . fenofibrate  54 mg Oral Daily  . Flora-Q  2 capsule Oral BID  . folic acid  1 mg Oral Daily  . piperacillin-tazobactam (ZOSYN)  IV  3.375 g Intravenous Q8H  . vancomycin  1,500 mg Intravenous Q24H  . vancomycin  1,000 mg Intravenous Once   Assessment: Acute on chronic renal insufficiency obesity  Goal of Therapy:  Vancomycin trough level 10-15 mcg/ml Eradicate  infection.  Plan: Zosyn 3.375gm iv q8hrs Vancomycin 1500mg  iv q24hrs Check trough at steady state Monitor renal fxn and cultures   Valrie Hart A 10/15/2010,9:01 AM

## 2010-10-15 NOTE — ED Notes (Signed)
Report given to [facility], pt taken upstairs

## 2010-10-15 NOTE — H&P (Signed)
PCP:   Dwana Melena, MD   Chief Complaint:  Pain in the left leg for the past 2 days  HPI: Alex Petty is an 65 y.o. Caucasian gentleman with a history of morbid obesity, lymphedema of the lower extremity for the past 25 years, reports that she's had a weeping from the left leg for the past few eventually decided to visit his primary care physician a few weeks ago cultured and started him on Keflex. Despite this leg has become red and he is not having pain in the legs spreading up to his thigh and is having sweating and chills. Suspected cellulitis since he had a bolt of this in 2005 and came to the emergency room for further evaluation.  He denies chest pain shortness of breath headaches blurred vision. He denies nausea or vomiting. He denies an awareness of chronic kidney disease but says he does visit Dr.Befakadu regularly.  Denies tobacco alcohol or illicit drug use; he is a retired Engineer, petroleum; It is also hemicolectomy for colon cancer in 2005; status post lumbar disc surgery with Dr. Phoebe Perch in 1989 2006; assess for cellulitis of the lower extremity in 2005. 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  Past Medical History  Diagnosis Date  . Arthritis   . Hypertension   . Colon cancer     Past Surgical History  Procedure Date  . Back surgery   . Foot surgery     Medications: He is referred to the medical chart for his home medication which apparently include Voltaren tablets.  Allergies:  Allergies  Allergen Reactions  . Ace Inhibitors   . Beta Adrenergic Blockers   . Rocephin (Ceftriaxone Sodium)     Social History:   reports that he has never smoked. He does not have any smokeless tobacco history on file. He reports that he does not drink alcohol or use illicit drugs.  Family History: History reviewed. No pertinent family history.  Rewiew of  Systems:  The patient denies anorexia, fever, weight loss,, vision loss, decreased hearing, hoarseness, chest pain, syncope, dyspnea on exertion, peripheral edema, balance deficits, hemoptysis, abdominal pain, melena, hematochezia, severe indigestion/heartburn, hematuria, incontinence, genital sores, muscle weakness, suspicious skin lesions, transient blindness, difficulty walking, depression, unusual weight change, abnormal bleeding, enlarged lymph nodes, angioedema, and breast masses.  Physical Exam: Filed Vitals:   10/15/10 0425 10/15/10 0617  BP: 117/61 110/64  Pulse: 87 78  Temp: 99.1 F (37.3 C)   TempSrc: Oral   Resp: 20 20  Height: 6\' 3"  (1.905 m)   Weight: 163.295 kg (360 lb)   SpO2: 93% 91%   Blood pressure 110/64, pulse 78, temperature 99.1 F (37.3 C), temperature source Oral, resp. rate 20, height 6\' 3"  (1.905 m), weight 163.295 kg (360 lb), SpO2 91.00%.  GEN: Morbidly obese elderly Caucasian gentleman lying in the stretcher lying in the stretcher;cooperative with exam PSYCH: He is alert and oriented x4; does not appear anxious does not appear depressed; affect is normal HEENT: Mucous membranes pink, very dry and and anicteric; PERRLA; EOM intact; very thick neck, limiting the exam, no cervical lymphadenopathy nor thyromegaly or carotid bruit; no JVD; CHEST WALL: No tenderness CHEST: Normal respiration, clear to auscultation bilaterally HEART: Regular rate and rhythm; 3/6 systolic murmur no gallop ABDOMEN: Massively obese, soft non-tender; no masses, no organomegaly, normal abdominal bowel sounds; large pannus; intertriginous rash Rectal Exam:  Not done EXTREMITIES: Disorganized and her disorganization and deformity of both ankles bilateral external rotation deformity deformity of knees marked deformity of joints of the hands; ; 2-3+ edema of both no edema; vesicular erythematous rash in the left leg with weeping. He has extensive cellulitis of the left leg left thigh his home  and he has tender lymphadenopathy of the groin and femoral nodes on the left side. Toes are warm bilaterally Genitalia: not examined  SKIN: Other than the cellulitis described above,  no rash or ulceration CNS: Cranial nerves 2-12 grossly intact no focal neurologic deficit   Labs & Imaging Results for orders placed during the hospital encounter of 10/15/10 (from the past 48 hour(s))  CBC     Status: Abnormal   Collection Time   10/15/10  4:54 AM      Component Value Range Comment   WBC 12.8 (*) 4.0 - 10.5 (K/uL)    RBC 4.63  4.22 - 5.81 (MIL/uL)    Hemoglobin 12.8 (*) 13.0 - 17.0 (g/dL)    HCT 16.1  09.6 - 04.5 (%)    MCV 87.0  78.0 - 100.0 (fL)    MCH 27.6  26.0 - 34.0 (pg)    MCHC 31.8  30.0 - 36.0 (g/dL)    RDW 40.9 (*) 81.1 - 15.5 (%)    Platelets 140 (*) 150 - 400 (K/uL)   DIFFERENTIAL     Status: Abnormal   Collection Time   10/15/10  4:54 AM      Component Value Range Comment   Neutrophils Relative 83 (*) 43 - 77 (%)    Neutro Abs 10.6 (*) 1.7 - 7.7 (K/uL)    Lymphocytes Relative 15  12 - 46 (%)    Lymphs Abs 1.9  0.7 - 4.0 (K/uL)    Monocytes Relative 3  3 - 12 (%)    Monocytes Absolute 0.3  0.1 - 1.0 (K/uL)    Eosinophils Relative 0  0 - 5 (%)    Eosinophils Absolute 0.0  0.0 - 0.7 (K/uL)    Basophils Relative 0  0 - 1 (%)    Basophils Absolute 0.0  0.0 - 0.1 (K/uL)   BASIC METABOLIC PANEL     Status: Abnormal   Collection Time   10/15/10  4:54 AM      Component Value Range Comment   Sodium 138  135 - 145 (mEq/L)    Potassium 4.2  3.5 - 5.1 (mEq/L)    Chloride 98  96 - 112 (mEq/L)    CO2 30  19 - 32 (mEq/L)    Glucose, Bld 134 (*) 70 - 99 (mg/dL)    BUN 38 (*) 6 - 23 (mg/dL)    Creatinine, Ser 9.14 (*) 0.50 - 1.35 (mg/dL)    Calcium 8.9  8.4 - 10.5 (mg/dL)    GFR calc non Af Amer 23 (*) >90 (mL/min)    GFR calc Af Amer 27 (*) >90 (mL/min)    No results found.    Assessment Present on Admission:  .Cellulitis of leg, left .Acute on chronic kidney disease,  stage 3 .Morbid obesity .DJD (degenerative joint disease) .Lymphedema of lower extremity   PLAN: Admit this gentleman for hydration and intravenous antibiotic therapy; discontinued the use of any seeds in this gentleman who likely has chronic kidney disease and will consult his nephrologist for assistance with management of his kidney disease since his baseline his truly not known to [place].  His blood glucose is slightly elevated we'll  check a hemoglobin A1c and monitor at the hospital  Other plans as per orders.   Sony Schlarb 10/15/2010, 6:29 AM

## 2010-10-16 ENCOUNTER — Inpatient Hospital Stay (HOSPITAL_COMMUNITY): Payer: Medicare Other

## 2010-10-16 LAB — CBC
HCT: 37.6 % — ABNORMAL LOW (ref 39.0–52.0)
Hemoglobin: 11.7 g/dL — ABNORMAL LOW (ref 13.0–17.0)
RBC: 4.21 MIL/uL — ABNORMAL LOW (ref 4.22–5.81)
RDW: 18.7 % — ABNORMAL HIGH (ref 11.5–15.5)
WBC: 13.7 10*3/uL — ABNORMAL HIGH (ref 4.0–10.5)

## 2010-10-16 LAB — COMPREHENSIVE METABOLIC PANEL
Albumin: 2.9 g/dL — ABNORMAL LOW (ref 3.5–5.2)
Alkaline Phosphatase: 71 U/L (ref 39–117)
BUN: 44 mg/dL — ABNORMAL HIGH (ref 6–23)
Chloride: 100 mEq/L (ref 96–112)
Potassium: 4.4 mEq/L (ref 3.5–5.1)
Total Bilirubin: 0.8 mg/dL (ref 0.3–1.2)

## 2010-10-16 LAB — ANA: Anti Nuclear Antibody(ANA): NEGATIVE

## 2010-10-16 LAB — C3 COMPLEMENT: C3 Complement: 111 mg/dL (ref 90–180)

## 2010-10-16 MED ORDER — ENOXAPARIN SODIUM 80 MG/0.8ML ~~LOC~~ SOLN
80.0000 mg | Freq: Every day | SUBCUTANEOUS | Status: DC
  Administered 2010-10-16 – 2010-10-19 (×4): 80 mg via SUBCUTANEOUS
  Filled 2010-10-16 (×4): qty 0.8

## 2010-10-16 MED ORDER — FUROSEMIDE 10 MG/ML IJ SOLN
40.0000 mg | Freq: Every day | INTRAMUSCULAR | Status: DC
  Administered 2010-10-16 – 2010-10-17 (×2): 40 mg via INTRAVENOUS
  Filled 2010-10-16 (×2): qty 4

## 2010-10-16 NOTE — Progress Notes (Signed)
Subjective: This man, who is morbidly obese, was admitted with left leg cellulitis and also renal failure. Unfortunately, we do not know his baseline creatinine but he does tell me he has seen Dr Fausto Skillern in the past. Dr Fausto Skillern has seen him now in the hospital and he is adjusting his medications and ordering further investigations. His left leg seems to be improving as far as his cellulitis is concerned.           Physical Exam: Blood pressure 100/58, pulse 73, temperature 98.1 F (36.7 C), temperature source Oral, resp. rate 18, height 6\' 3"  (1.905 m), weight 163.9 kg (361 lb 5.3 oz), SpO2 91.00%. He looks systemically well. Is not toxic or septic. He is morbidly obese. He clearly has left leg cellulitis which is clinically improving also. Heart sounds are present and normal without murmurs. Lung fields are clear. He is alert and orientated without any focal neurological signs.   Investigations: Results for orders placed during the hospital encounter of 10/15/10 (from the past 48 hour(s))  CBC     Status: Abnormal   Collection Time   10/15/10  4:54 AM      Component Value Range Comment   WBC 12.8 (*) 4.0 - 10.5 (K/uL)    RBC 4.63  4.22 - 5.81 (MIL/uL)    Hemoglobin 12.8 (*) 13.0 - 17.0 (g/dL)    HCT 16.1  09.6 - 04.5 (%)    MCV 87.0  78.0 - 100.0 (fL)    MCH 27.6  26.0 - 34.0 (pg)    MCHC 31.8  30.0 - 36.0 (g/dL)    RDW 40.9 (*) 81.1 - 15.5 (%)    Platelets 140 (*) 150 - 400 (K/uL)   DIFFERENTIAL     Status: Abnormal   Collection Time   10/15/10  4:54 AM      Component Value Range Comment   Neutrophils Relative 83 (*) 43 - 77 (%)    Neutro Abs 10.6 (*) 1.7 - 7.7 (K/uL)    Lymphocytes Relative 15  12 - 46 (%)    Lymphs Abs 1.9  0.7 - 4.0 (K/uL)    Monocytes Relative 3  3 - 12 (%)    Monocytes Absolute 0.3  0.1 - 1.0 (K/uL)    Eosinophils Relative 0  0 - 5 (%)    Eosinophils Absolute 0.0  0.0 - 0.7 (K/uL)    Basophils Relative 0  0 - 1 (%)    Basophils Absolute 0.0  0.0 -  0.1 (K/uL)   BASIC METABOLIC PANEL     Status: Abnormal   Collection Time   10/15/10  4:54 AM      Component Value Range Comment   Sodium 138  135 - 145 (mEq/L)    Potassium 4.2  3.5 - 5.1 (mEq/L)    Chloride 98  96 - 112 (mEq/L)    CO2 30  19 - 32 (mEq/L)    Glucose, Bld 134 (*) 70 - 99 (mg/dL)    BUN 38 (*) 6 - 23 (mg/dL)    Creatinine, Ser 9.14 (*) 0.50 - 1.35 (mg/dL)    Calcium 8.9  8.4 - 10.5 (mg/dL)    GFR calc non Af Amer 23 (*) >90 (mL/min)    GFR calc Af Amer 27 (*) >90 (mL/min)   HEPATIC FUNCTION PANEL     Status: Abnormal   Collection Time   10/15/10  4:54 AM      Component Value Range Comment   Total Protein 6.9  6.0 - 8.3 (g/dL)    Albumin 3.6  3.5 - 5.2 (g/dL)    AST 21  0 - 37 (U/L)    ALT 21  0 - 53 (U/L)    Alkaline Phosphatase 49  39 - 117 (U/L)    Total Bilirubin 1.1  0.3 - 1.2 (mg/dL)    Bilirubin, Direct 0.4 (*) 0.0 - 0.3 (mg/dL)    Indirect Bilirubin 0.7  0.3 - 0.9 (mg/dL)   PHOSPHORUS     Status: Normal   Collection Time   10/15/10  4:54 AM      Component Value Range Comment   Phosphorus 2.9  2.3 - 4.6 (mg/dL)   APTT     Status: Normal   Collection Time   10/15/10  4:54 AM      Component Value Range Comment   aPTT 35  24 - 37 (seconds)   PROTIME-INR     Status: Abnormal   Collection Time   10/15/10  4:54 AM      Component Value Range Comment   Prothrombin Time 16.2 (*) 11.6 - 15.2 (seconds)    INR 1.27  0.00 - 1.49    TSH     Status: Normal   Collection Time   10/15/10  4:54 AM      Component Value Range Comment   TSH 2.311     HEMOGLOBIN A1C     Status: Abnormal   Collection Time   10/15/10  4:54 AM      Component Value Range Comment   Hemoglobin A1C 5.7 (*) <5.7 (%)    Mean Plasma Glucose 117 (*) <117 (mg/dL)   MRSA PCR SCREENING     Status: Normal   Collection Time   10/15/10  8:18 AM      Component Value Range Comment   MRSA by PCR NEGATIVE  NEGATIVE    C3 COMPLEMENT     Status: Normal   Collection Time   10/15/10 12:58 PM      Component  Value Range Comment   C3 Complement 111  90 - 180 (mg/dL)   C4 COMPLEMENT     Status: Normal   Collection Time   10/15/10 12:58 PM      Component Value Range Comment   Complement C4, Body Fluid 29  10 - 40 (mg/dL)   ANA     Status: Normal   Collection Time   10/15/10 12:58 PM      Component Value Range Comment   ANA NEGATIVE  NEGATIVE    URINALYSIS, ROUTINE W REFLEX MICROSCOPIC     Status: Abnormal   Collection Time   10/15/10 10:30 PM      Component Value Range Comment   Color, Urine YELLOW  YELLOW     Appearance CLEAR  CLEAR     Specific Gravity, Urine >1.030 (*) 1.005 - 1.030     pH 6.0  5.0 - 8.0     Glucose, UA NEGATIVE  NEGATIVE (mg/dL)    Hgb urine dipstick NEGATIVE  NEGATIVE     Bilirubin Urine NEGATIVE  NEGATIVE     Ketones, ur NEGATIVE  NEGATIVE (mg/dL)    Protein, ur NEGATIVE  NEGATIVE (mg/dL)    Urobilinogen, UA 0.2  0.0 - 1.0 (mg/dL)    Nitrite NEGATIVE  NEGATIVE     Leukocytes, UA NEGATIVE  NEGATIVE  MICROSCOPIC NOT DONE ON URINES WITH NEGATIVE PROTEIN, BLOOD, LEUKOCYTES, NITRITE, OR GLUCOSE <1000 mg/dL.  CBC     Status: Abnormal  Collection Time   10/16/10  3:54 AM      Component Value Range Comment   WBC 13.7 (*) 4.0 - 10.5 (K/uL)    RBC 4.21 (*) 4.22 - 5.81 (MIL/uL)    Hemoglobin 11.7 (*) 13.0 - 17.0 (g/dL)    HCT 16.1 (*) 09.6 - 52.0 (%)    MCV 89.3  78.0 - 100.0 (fL)    MCH 27.8  26.0 - 34.0 (pg)    MCHC 31.1  30.0 - 36.0 (g/dL)    RDW 04.5 (*) 40.9 - 15.5 (%)    Platelets 134 (*) 150 - 400 (K/uL)   COMPREHENSIVE METABOLIC PANEL     Status: Abnormal   Collection Time   10/16/10  3:54 AM      Component Value Range Comment   Sodium 137  135 - 145 (mEq/L)    Potassium 4.4  3.5 - 5.1 (mEq/L)    Chloride 100  96 - 112 (mEq/L)    CO2 30  19 - 32 (mEq/L)    Glucose, Bld 142 (*) 70 - 99 (mg/dL)    BUN 44 (*) 6 - 23 (mg/dL)    Creatinine, Ser 8.11 (*) 0.50 - 1.35 (mg/dL)    Calcium 8.3 (*) 8.4 - 10.5 (mg/dL)    Total Protein 6.3  6.0 - 8.3 (g/dL)    Albumin  2.9 (*) 3.5 - 5.2 (g/dL)    AST 13  0 - 37 (U/L)    ALT 15  0 - 53 (U/L)    Alkaline Phosphatase 71  39 - 117 (U/L)    Total Bilirubin 0.8  0.3 - 1.2 (mg/dL)    GFR calc non Af Amer 20 (*) >90 (mL/min)    GFR calc Af Amer 23 (*) >90 (mL/min)    Recent Results (from the past 240 hour(s))  MRSA PCR SCREENING     Status: Normal   Collection Time   10/15/10  8:18 AM      Component Value Range Status Comment   MRSA by PCR NEGATIVE  NEGATIVE  Final         Medications: I have reviewed the patient's current medications.  Impression: 1. Left leg cellulitis. 2. Acute on chronic kidney disease. Worsening creatinine. 3. Morbid obesity. 4. Lymphedema of the lower extremity. 5. Remote history of colon cancer.     Plan: 1. Continue intravenous fluids and intravenous antibiotics. 2. Monitor renal function. Adjustments to diuretics are being made by Dr Fausto Skillern. He has also ordered an ultrasound of the kidneys.     LOS: 1 day   Ioana Louks C 10/16/2010, 11:47 AM

## 2010-10-16 NOTE — Progress Notes (Signed)
Physical Therapy Treatment Patient Name: Alex Petty [id number] Date: 10/16/2010  TIME: 1200-1220/ 1 TE Problem List:  Patient Active Problem List  Diagnoses  . Cellulitis of leg, left  . Acute on chronic kidney disease, stage 3  . Morbid obesity  . DJD (degenerative joint disease)  . Lymphedema of lower extremity   Past Medical History:  Past Medical History  Diagnosis Date  . Arthritis   . Hypertension   . Colon cancer   . Chronic kidney disease   . DJD (degenerative joint disease)   . DJD (degenerative joint disease), ankle and foot   . Arthritis   . Cellulitis   . Arthritis    Past Surgical History:  Past Surgical History  Procedure Date  . Back surgery   . Foot surgery   . Colon surgery   . Tonsillectomy   . Cystoscopy with urethral dilatation    Precautions/Restrictions  Restrictions Weight Bearing Restrictions: No Mobility (including Balance) Bed Mobility Supine to Sit: 6: Modified independent (Device/Increase time) Sitting - Scoot to Edge of Bed: 6: Modified independent (Device/Increase time) Transfers Transfers: Yes Sit to Stand: 5: Supervision Ambulation/Gait Ambulation/Gait: Yes Ambulation/Gait Assistance: 5: Supervision Ambulation/Gait Assistance Details (indicate cue type and reason): pt using cane and furniture in room from bed to bathroom Ambulation Distance (Feet): 10 Feet Assistive device: Straight cane Gait velocity: slow Stairs: No Wheelchair Mobility Wheelchair Mobility: No    Exercise  Total Joint Exercises Ankle Circles/Pumps: Both;20 reps Quad Sets: Both;10 reps Heel Slides: Both;10 reps Hip ABduction/ADduction: Both;10 reps Long Arc Quad: Both;10 reps General Exercises - Lower Extremity Ankle Circles/Pumps: Both;20 reps Quad Sets: Both;10 reps Long Arc Quad: Both;10 reps Heel Slides: Both;10 reps Hip ABduction/ADduction: Both;10 reps Toe Raises: Both;10 reps Heel Raises: 10 reps;Both Low Level/ICU Exercises Ankle  Circles/Pumps: Both;20 reps Quad Sets: Both;10 reps Hip ABduction/ADduction: Both;10 reps Heel Slides: Both;10 reps  End of Session PT - End of Session Equipment Utilized During Treatment: Gait belt (cane) Activity Tolerance: Patient limited by fatigue;Treatment limited secondary to medical complications (Comment) (nausea) Patient left: with family/visitor present (pt with daughter in bathrrom) General Behavior During Session: [facility] for tasks performed Cognition: [facility] for tasks performed PT Assessment/Plan  PT - Assessment/Plan Comments on Treatment Session: Pt complete and tolerate all bed/EOB exercises today;pt experience nausea at EOB but was able to amb to bathroom 10' using cane and room furniture for support;pt became too nauseous to continue with PT and nursing nofited for meds PT Goals  Acute Rehab PT Goals PT Transfer Goal: Sit to Stand/Stand to Sit - Progress: Progressing toward goal PT Goal: Ambulate - Progress: Progressing toward goal  Ariauna Farabee ATKINSO 10/16/2010, 12:34 PM

## 2010-10-16 NOTE — Progress Notes (Signed)
Subjective: Interval History: has no complaint of nausea. Patient states that his appetite is not that good. He denies any chest pain but he has exertional dyspnea and some orthopnea. He claims has no significant difference for him since he came to the hospital..  Objective: Vital signs in last 24 hours: Temp:  [97.2 F (36.2 C)-99.1 F (37.3 C)] 98.1 F (36.7 C) (10/05 0548) Pulse Rate:  [67-91] 73  (10/05 0548) Resp:  [18-24] 18  (10/05 0548) BP: (97-118)/(58-68) 100/58 mmHg (10/05 0548) SpO2:  [91 %-95 %] 91 % (10/05 0548) Weight:  [163.295 kg (360 lb)-163.9 kg (361 lb 5.3 oz)] 361 lb 5.3 oz (163.9 kg) (10/05 0548) Weight change: 0 kg (0 lb)  Intake/Output from previous day: 10/04 0701 - 10/05 0700 In: 1373.3 [P.O.:240; I.V.:581.3; IV Piggyback:552] Out: 350 [Urine:350] Intake/Output this shift:    General appearance: alert and no distress Resp: diminished breath sounds bibasilar and bilaterally Cardio: regular rate and rhythm, S1, S2 normal, no murmur, click, rub or gallop GI: soft, non-tender; bowel sounds normal; no masses,  no organomegaly Extremities: venous stasis dermatitis noted and He has bilateral edema and the also her some cellulitis of her his left leg.  Lab Results:  Basename 10/16/10 0354 10/15/10 0454  WBC 13.7* 12.8*  HGB 11.7* 12.8*  HCT 37.6* 40.3  PLT 134* 140*   BMET:  Basename 10/16/10 0354 10/15/10 0454  NA 137 138  K 4.4 4.2  CL 100 98  CO2 30 30  GLUCOSE 142* 134*  BUN 44* 38*  CREATININE 3.10* 2.68*  CALCIUM 8.3* 8.9   No results found for this basename: PTH:2 in the last 72 hours Iron Studies: No results found for this basename: IRON,TIBC,TRANSFERRIN,FERRITIN in the last 72 hours  Studies/Results: No results found.  I have reviewed the patient's current medications.  Assessment/Plan: Problem #1 renal failure at this moment  BUN and creatinine seems to be increasing. Patient has underlying chronic renal failure stage III. The  present increasing BUN and creatinine could be secondary to ATN versus interstitial nephritis versus progression of her his kidney disease. Problem #2 history of anemia mild Problem #3 history of cellulitis is on antibiotics his a febrile his white blood cell count seems to be increasing. Problem #4 history of her pedis pulses and lymphedema overall seems to be better than before but still he has significant edema. Problem #5 history of degenerative joint disease with back surgery complaints of back pain Problem #6 history of morbid obesity Problem #7 history of hypertension blood pressure seems to be reasonable Problem #8 history of  proteinuria Problem #9 history of  exertional dyspnea multifactorial possibly from sleep apnea versus CHF versus possibly cardiac. Recommendation we'll DC her Demadex We'll start him on Lasix 100 mg IV twice a day We'll follow his basic metabolic panel and phosphorus in the morning We'll do ultrasound of the kidneys. His renal function continued to tolerate patient may possibly require dialysis in the future.     LOS: 1 day   Audi Conover S 10/16/2010,8:42 AM

## 2010-10-17 LAB — URINE CULTURE
Colony Count: NO GROWTH
Culture  Setup Time: 201210051324
Culture: NO GROWTH

## 2010-10-17 LAB — BASIC METABOLIC PANEL
BUN: 50 mg/dL — ABNORMAL HIGH (ref 6–23)
Calcium: 7.7 mg/dL — ABNORMAL LOW (ref 8.4–10.5)
Chloride: 97 mEq/L (ref 96–112)
Creatinine, Ser: 3.06 mg/dL — ABNORMAL HIGH (ref 0.50–1.35)
GFR calc Af Amer: 23 mL/min — ABNORMAL LOW (ref >90)

## 2010-10-17 MED ORDER — DILTIAZEM HCL 60 MG PO TABS
60.0000 mg | ORAL_TABLET | Freq: Two times a day (BID) | ORAL | Status: DC
  Administered 2010-10-17 – 2010-10-19 (×4): 60 mg via ORAL
  Filled 2010-10-17 (×4): qty 1

## 2010-10-17 NOTE — Progress Notes (Signed)
Subjective: This man, who is morbidly obese, was admitted with left leg cellulitis and also renal failure. Dr Fausto Skillern, nephrology, is following. His left leg is much improved today and the redness is now from the knee downwards as opposed to being in the thigh also.          Physical Exam: Blood pressure 117/70, pulse 55, temperature 97.9 F (36.6 C), temperature source Oral, resp. rate 24, height 6\' 3"  (1.905 m), weight 163.9 kg (361 lb 5.3 oz), SpO2 93.00%. He looks systemically well. Is not toxic or septic. He is morbidly obese. He clearly has left leg cellulitis which is clinically improving also. Heart sounds are present and normal without murmurs. Lung fields are clear. He is alert and orientated without any focal neurological signs.   Investigations: Results for orders placed during the hospital encounter of 10/15/10 (from the past 48 hour(s))  CBC     Status: Abnormal   Collection Time   10/15/10  4:54 AM      Component Value Range Comment   WBC 12.8 (*) 4.0 - 10.5 (K/uL)    RBC 4.63  4.22 - 5.81 (MIL/uL)    Hemoglobin 12.8 (*) 13.0 - 17.0 (g/dL)    HCT 16.1  09.6 - 04.5 (%)    MCV 87.0  78.0 - 100.0 (fL)    MCH 27.6  26.0 - 34.0 (pg)    MCHC 31.8  30.0 - 36.0 (g/dL)    RDW 40.9 (*) 81.1 - 15.5 (%)    Platelets 140 (*) 150 - 400 (K/uL)   DIFFERENTIAL     Status: Abnormal   Collection Time   10/15/10  4:54 AM      Component Value Range Comment   Neutrophils Relative 83 (*) 43 - 77 (%)    Neutro Abs 10.6 (*) 1.7 - 7.7 (K/uL)    Lymphocytes Relative 15  12 - 46 (%)    Lymphs Abs 1.9  0.7 - 4.0 (K/uL)    Monocytes Relative 3  3 - 12 (%)    Monocytes Absolute 0.3  0.1 - 1.0 (K/uL)    Eosinophils Relative 0  0 - 5 (%)    Eosinophils Absolute 0.0  0.0 - 0.7 (K/uL)    Basophils Relative 0  0 - 1 (%)    Basophils Absolute 0.0  0.0 - 0.1 (K/uL)   BASIC METABOLIC PANEL     Status: Abnormal   Collection Time   10/15/10  4:54 AM      Component Value Range Comment   Sodium  138  135 - 145 (mEq/L)    Potassium 4.2  3.5 - 5.1 (mEq/L)    Chloride 98  96 - 112 (mEq/L)    CO2 30  19 - 32 (mEq/L)    Glucose, Bld 134 (*) 70 - 99 (mg/dL)    BUN 38 (*) 6 - 23 (mg/dL)    Creatinine, Ser 9.14 (*) 0.50 - 1.35 (mg/dL)    Calcium 8.9  8.4 - 10.5 (mg/dL)    GFR calc non Af Amer 23 (*) >90 (mL/min)    GFR calc Af Amer 27 (*) >90 (mL/min)   HEPATIC FUNCTION PANEL     Status: Abnormal   Collection Time   10/15/10  4:54 AM      Component Value Range Comment   Total Protein 6.9  6.0 - 8.3 (g/dL)    Albumin 3.6  3.5 - 5.2 (g/dL)    AST 21  0 - 37 (U/L)  ALT 21  0 - 53 (U/L)    Alkaline Phosphatase 49  39 - 117 (U/L)    Total Bilirubin 1.1  0.3 - 1.2 (mg/dL)    Bilirubin, Direct 0.4 (*) 0.0 - 0.3 (mg/dL)    Indirect Bilirubin 0.7  0.3 - 0.9 (mg/dL)   PHOSPHORUS     Status: Normal   Collection Time   10/15/10  4:54 AM      Component Value Range Comment   Phosphorus 2.9  2.3 - 4.6 (mg/dL)   APTT     Status: Normal   Collection Time   10/15/10  4:54 AM      Component Value Range Comment   aPTT 35  24 - 37 (seconds)   PROTIME-INR     Status: Abnormal   Collection Time   10/15/10  4:54 AM      Component Value Range Comment   Prothrombin Time 16.2 (*) 11.6 - 15.2 (seconds)    INR 1.27  0.00 - 1.49    TSH     Status: Normal   Collection Time   10/15/10  4:54 AM      Component Value Range Comment   TSH 2.311     HEMOGLOBIN A1C     Status: Abnormal   Collection Time   10/15/10  4:54 AM      Component Value Range Comment   Hemoglobin A1C 5.7 (*) <5.7 (%)    Mean Plasma Glucose 117 (*) <117 (mg/dL)   MRSA PCR SCREENING     Status: Normal   Collection Time   10/15/10  8:18 AM      Component Value Range Comment   MRSA by PCR NEGATIVE  NEGATIVE    C3 COMPLEMENT     Status: Normal   Collection Time   10/15/10 12:58 PM      Component Value Range Comment   C3 Complement 111  90 - 180 (mg/dL)   C4 COMPLEMENT     Status: Normal   Collection Time   10/15/10 12:58 PM       Component Value Range Comment   Complement C4, Body Fluid 29  10 - 40 (mg/dL)   ANA     Status: Normal   Collection Time   10/15/10 12:58 PM      Component Value Range Comment   ANA NEGATIVE  NEGATIVE    URINALYSIS, ROUTINE W REFLEX MICROSCOPIC     Status: Abnormal   Collection Time   10/15/10 10:30 PM      Component Value Range Comment   Color, Urine YELLOW  YELLOW     Appearance CLEAR  CLEAR     Specific Gravity, Urine >1.030 (*) 1.005 - 1.030     pH 6.0  5.0 - 8.0     Glucose, UA NEGATIVE  NEGATIVE (mg/dL)    Hgb urine dipstick NEGATIVE  NEGATIVE     Bilirubin Urine NEGATIVE  NEGATIVE     Ketones, ur NEGATIVE  NEGATIVE (mg/dL)    Protein, ur NEGATIVE  NEGATIVE (mg/dL)    Urobilinogen, UA 0.2  0.0 - 1.0 (mg/dL)    Nitrite NEGATIVE  NEGATIVE     Leukocytes, UA NEGATIVE  NEGATIVE  MICROSCOPIC NOT DONE ON URINES WITH NEGATIVE PROTEIN, BLOOD, LEUKOCYTES, NITRITE, OR GLUCOSE <1000 mg/dL.  CBC     Status: Abnormal   Collection Time   10/16/10  3:54 AM      Component Value Range Comment   WBC 13.7 (*) 4.0 - 10.5 (K/uL)  RBC 4.21 (*) 4.22 - 5.81 (MIL/uL)    Hemoglobin 11.7 (*) 13.0 - 17.0 (g/dL)    HCT 16.1 (*) 09.6 - 52.0 (%)    MCV 89.3  78.0 - 100.0 (fL)    MCH 27.8  26.0 - 34.0 (pg)    MCHC 31.1  30.0 - 36.0 (g/dL)    RDW 04.5 (*) 40.9 - 15.5 (%)    Platelets 134 (*) 150 - 400 (K/uL)   COMPREHENSIVE METABOLIC PANEL     Status: Abnormal   Collection Time   10/16/10  3:54 AM      Component Value Range Comment   Sodium 137  135 - 145 (mEq/L)    Potassium 4.4  3.5 - 5.1 (mEq/L)    Chloride 100  96 - 112 (mEq/L)    CO2 30  19 - 32 (mEq/L)    Glucose, Bld 142 (*) 70 - 99 (mg/dL)    BUN 44 (*) 6 - 23 (mg/dL)    Creatinine, Ser 8.11 (*) 0.50 - 1.35 (mg/dL)    Calcium 8.3 (*) 8.4 - 10.5 (mg/dL)    Total Protein 6.3  6.0 - 8.3 (g/dL)    Albumin 2.9 (*) 3.5 - 5.2 (g/dL)    AST 13  0 - 37 (U/L)    ALT 15  0 - 53 (U/L)    Alkaline Phosphatase 71  39 - 117 (U/L)    Total Bilirubin  0.8  0.3 - 1.2 (mg/dL)    GFR calc non Af Amer 20 (*) >90 (mL/min)    GFR calc Af Amer 23 (*) >90 (mL/min)    Recent Results (from the past 240 hour(s))  MRSA PCR SCREENING     Status: Normal   Collection Time   10/15/10  8:18 AM      Component Value Range Status Comment   MRSA by PCR NEGATIVE  NEGATIVE  Final   URINE CULTURE     Status: Normal   Collection Time   10/15/10 10:30 PM      Component Value Range Status Comment   Specimen Description URINE, CLEAN CATCH   Final    Special Requests NONE   Final    Setup Time [id number]   Final    Colony Count NO GROWTH   Final    Culture NO GROWTH   Final    Report Status 10/17/2010 FINAL   Final         Medications: I have reviewed the patient's current medications.  Impression: 1. Left leg cellulitis. 2. Acute on chronic kidney disease. Ultrasound of the kidneys shows findings consistent with medical renal disease. 3. Morbid obesity. 4. Lymphedema of the lower extremity. 5. Remote history of colon cancer.     Plan: 1. Continue intravenous fluids and intravenous antibiotics. 2. Monitor renal function. Adjustments to diuretics are being made by Dr Fausto Skillern.  3. Possible discharge home soon based on improving cellulitis. I suspect he will need a further one to 2 days of intravenous antibiotics. The rest of his treatment can be carried out as an outpatient with oral antibiotics.     LOS: 2 days   GOSRANI,NIMISH C 10/17/2010, 12:33 PM

## 2010-10-17 NOTE — Progress Notes (Signed)
Subjective: Interval History: has no complaint of shortness of breath or nausea or vomiting..  Objective: Vital signs in last 24 hours: Temp:  [97.6 F (36.4 C)-98.8 F (37.1 C)] 97.9 F (36.6 C) (10/06 0547) Pulse Rate:  [55-71] 55  (10/06 0547) Resp:  [16-24] 24  (10/06 0547) BP: (111-117)/(70-72) 117/70 mmHg (10/06 0547) SpO2:  [91 %-93 %] 93 % (10/06 0811) Weight change:   Intake/Output from previous day: 10/05 0701 - 10/06 0700 In: 2996.3 [P.O.:460; I.V.:2286.3; IV Piggyback:250] Out: 300 [Urine:300] Intake/Output this shift:    General appearance: alert Resp: clear to auscultation bilaterally Cardio: regular rate and rhythm, S1, S2 normal, no murmur, click, rub or gallop GI: Obese difficult to palpate organomegaly nontender Extremities: venous stasis dermatitis noted and Erythema  Lab Results:  [facility] 10/16/10 0354 10/15/10 0454  WBC 13.7* 12.8*  HGB 11.7* 12.8*  HCT 37.6* 40.3  PLT 134* 140*   BMET:  Basename 10/17/10 0622 10/16/10 0354  NA 136 137  K 4.2 4.4  CL 97 100  CO2 30 30  GLUCOSE 127* 142*  BUN 50* 44*  CREATININE 3.06* 3.10*  CALCIUM 7.7* 8.3*   No results found for this basename: PTH:2 in the last 72 hours Iron Studies: No results found for this basename: IRON,TIBC,TRANSFERRIN,FERRITIN in the last 72 hours  Studies/Results: US Renal  10/16/2010  *RADIOLOGY REPORT*  Clinical Data: Elevated BUN and creatinine.  Renal failure.  RENAL/URINARY TRACT ULTRASOUND COMPLETE  Comparison:  07/22/2008.  Findings:  Right Kidney:  13 cm.  Echogenic renal cortex with renal cortical thinning.  No obstruction or hydronephrosis.  No calculi.  Left Kidney:  11.6 cm.  Echogenic renal cortex with renal cortical thinning.  Anechoic simple cyst is present in the inferior pole measuring 80 mm x 17 mm x 20 mm.  Bladder:  Decompressed urinary bladder.  As such, not evaluated.  IMPRESSION:  1.  Bilateral renal cortical thinning with echogenic cortex compatible with  medical renal disease. 2.  Simple left inferior pole renal cyst measuring 20 mm. 3.  Decompressed urinary bladder.  Original Report Authenticated By: Andreas Newport, M.D.    I have reviewed the patient's current medications.  Assessment/Plan: Problem #1 acute kidney injury superimposed on chronic pending creatinine at this moment seems to be stable. Patient has some nausea but no vomiting. Today is feeling better. Problem #2 history of anemia mild Problem #3 history of cellulitis of his left leg at this moment seems to be improving Problem #4 history of  lymphedema of the lower leg that also seems to be looking better. Problem #5 history of obesity Problem #6 history of degenerative joint disease. Problem #7 history of hypertension blood pressure seems to be controlled very well Recommendation we'll continue his present management We'll check his basic metabolic panel in the morning. And will continue his other medication as before.    LOS: 2 days   Sisto Granillo S 10/17/2010,9:48 AM

## 2010-10-17 NOTE — Progress Notes (Signed)
ANTIBIOTIC CONSULT NOTE - FOLLOW UP  Pharmacy Consult for Vancomycin and Zosyn  Indication: cellulitis  Allergies  Allergen Reactions  . Ace Inhibitors Other (See Comments)    cough  . Beta Adrenergic Blockers Other (See Comments)    cough  . Rocephin (Ceftriaxone Sodium) Hives    Patient Measurements: Height: 6\' 3"  (190.5 cm) Weight: 361 lb 5.3 oz (163.9 kg) IBW/kg (Calculated) : 84.5    Vital Signs: Temp: 97.9 F (36.6 C) (10/06 0547) Temp src: Oral (10/06 0547) BP: 117/70 mmHg (10/06 0547) Pulse Rate: 55  (10/06 0547) Intake/Output from previous day: 10/05 0701 - 10/06 0700 In: 2996.3 [P.O.:460; I.V.:2286.3; IV Piggyback:250] Out: 300 [Urine:300] Intake/Output from this shift: Total I/O In: -  Out: 600 [Urine:600]  Labs:  [facility] 10/17/10 0622 10/16/10 0354 10/15/10 0454  WBC -- 13.7* 12.8*  HGB -- 11.7* 12.8*  PLT -- 134* 140*  LABCREA -- -- --  CREATININE 3.06* 3.10* 2.68*   Estimated Creatinine Clearance: 39.6 ml/min (by C-G formula based on Cr of 3.06).  Basename 10/17/10 0900  VANCOTROUGH 17.1  VANCOPEAK --  VANCORANDOM --  GENTTROUGH --  GENTPEAK --  GENTRANDOM --  TOBRATROUGH --  TOBRAPEAK --  TOBRARND --  AMIKACINPEAK --  AMIKACINTROU --  AMIKACIN --     Microbiology: Recent Results (from the past 720 hour(s))  MRSA PCR SCREENING     Status: Normal   Collection Time   10/15/10  8:18 AM      Component Value Range Status Comment   MRSA by PCR NEGATIVE  NEGATIVE  Final   URINE CULTURE     Status: Normal   Collection Time   10/15/10 10:30 PM      Component Value Range Status Comment   Specimen Description URINE, CLEAN CATCH   Final    Special Requests NONE   Final    Setup Time [id number]   Final    Colony Count NO GROWTH   Final    Culture NO GROWTH   Final    Report Status 10/17/2010 FINAL   Final     Anti-infectives     Start     Dose/Rate Route Frequency Ordered Stop   10/15/10 0500   vancomycin (VANCOCIN) IVPB 1000 mg/200  mL premix        1,000 mg 200 mL/hr over 60 Minutes Intravenous  Once 10/15/10 0454 10/15/10 9562          Assessment: Vancomycin and Zosyn protocol   Goal of Therapy:  Vancomycin trough level 10-15 mcg/ml  Plan:  Vancomycin trough acceptable Continue Vancomycin 1500 mg every 24 hours Continue Zosyn 3.375 GM every 8 hours  Akeira Lahm [employer] 10/17/2010,10:32 AM

## 2010-10-18 LAB — BASIC METABOLIC PANEL
BUN: 48 mg/dL — ABNORMAL HIGH (ref 6–23)
Chloride: 100 mEq/L (ref 96–112)
GFR calc non Af Amer: 25 mL/min — ABNORMAL LOW (ref >90)
Glucose, Bld: 131 mg/dL — ABNORMAL HIGH (ref 70–99)
Potassium: 3.8 mEq/L (ref 3.5–5.1)

## 2010-10-18 MED ORDER — TORSEMIDE 20 MG PO TABS
50.0000 mg | ORAL_TABLET | Freq: Every day | ORAL | Status: DC
  Administered 2010-10-18 – 2010-10-19 (×2): 50 mg via ORAL
  Filled 2010-10-18: qty 2
  Filled 2010-10-18: qty 3
  Filled 2010-10-18: qty 1

## 2010-10-18 NOTE — Progress Notes (Signed)
Subjective: Interval History: has no complaint of shortness of breath or orthopnea . He denies any nausea vomiting. Overall  patient seems to be getting better..  Objective: Vital signs in last 24 hours: Temp:  [98 F (36.7 C)-98.9 F (37.2 C)] 98 F (36.7 C) (10/07 0600) Pulse Rate:  [66-76] 67  (10/07 0600) Resp:  [16-20] 20  (10/07 0600) BP: (91-120)/(55-66) 112/63 mmHg (10/07 0600) SpO2:  [89 %-94 %] 92 % (10/07 0807) Weight change:   Intake/Output from previous day: 10/06 0701 - 10/07 0700 In: 2061.3 [P.O.:720; I.V.:1191.3; IV Piggyback:150] Out: 1850 [Urine:1850] Intake/Output this shift:    patient  generally alert in no apparent distress. Chest had decreased breath sound otherwise seems to be clear no rales or rhonchi or egophony. His heart exam revealed a regular rate and rhythm no murmur S3 Abdomen obese difficult to palpate organomegaly Extremities patient with lymphedema seems iproving    Lab Results:  Basename 10/16/10 0354  WBC 13.7*  HGB 11.7*  HCT 37.6*  PLT 134*   BMET:  Basename 10/18/10 0626 10/17/10 0622  NA 138 136  K 3.8 4.2  CL 100 97  CO2 30 30  GLUCOSE 131* 127*  BUN 48* 50*  CREATININE 2.54* 3.06*  CALCIUM 7.5* 7.7*   No results found for this basename: PTH:2 in the last 72 hours Iron Studies: No results found for this basename: IRON,TIBC,TRANSFERRIN,FERRITIN in the last 72 hours  Studies/Results: US Renal  10/16/2010  *RADIOLOGY REPORT*  Clinical Data: Elevated BUN and creatinine.  Renal failure.  RENAL/URINARY TRACT ULTRASOUND COMPLETE  Comparison:  07/22/2008.  Findings:  Right Kidney:  13 cm.  Echogenic renal cortex with renal cortical thinning.  No obstruction or hydronephrosis.  No calculi.  Left Kidney:  11.6 cm.  Echogenic renal cortex with renal cortical thinning.  Anechoic simple cyst is present in the inferior pole measuring 80 mm x 17 mm x 20 mm.  Bladder:  Decompressed urinary bladder.  As such, not evaluated.  IMPRESSION:  1.   Bilateral renal cortical thinning with echogenic cortex compatible with medical renal disease. 2.  Simple left inferior pole renal cyst measuring 20 mm. 3.  Decompressed urinary bladder.  Original Report Authenticated By: Andreas Newport, M.D.    I have reviewed the patient's current medications.  Assessment/Plan: Problem #1 acute kidney injury superimposed on chronic pending creatinine this moment seems to be improving. BUN and  creatinine has returned to baseline. Problem #2 history of cellulitis leg swelling and erythema seems to be improving Her problem #3  history of  morbid obesity Her problem #4 history of  degenerative joint disease  Her problem #5  history of lymph edema long-standing her husband has seems to be looking better.  Her problem #6 history of  anemia and this is anemia of chronic disease H&H is stable . Problem #7 history of hypertension blood pressure seems to be controlled very well. Recommendation we'll DC IV Lasix and will put him on the Demadex 40 mg by mouth once a day We'll decrease his IV fluid to 2 by mouth We'll check his basic metabolic panel in the morning. We'll continue his other medications and we'll follow patient.    LOS: 3 days   Alex Petty S 10/18/2010,9:05 AM

## 2010-10-18 NOTE — Progress Notes (Signed)
Subjective: Cellulitis is improving. He has no acute complaints today.  Objective: Vital signs in last 24 hours: Temp:  [98 F (36.7 C)-98.9 F (37.2 C)] 98 F (36.7 C) (10/07 0600) Pulse Rate:  [66-76] 67  (10/07 0600) Resp:  [16-20] 20  (10/07 0600) BP: (91-120)/(55-66) 112/63 mmHg (10/07 0600) SpO2:  [89 %-94 %] 92 % (10/07 0807) Weight change:  Last BM Date: 10/18/10  Intake/Output from previous day: 10/06 0701 - 10/07 0700 In: 2061.3 [P.O.:720; I.V.:1191.3; IV Piggyback:150] Out: 1850 [Urine:1850]     Physical Exam: General: Alert, awake, oriented x3, in no acute distress. HEENT: No bruits, no goiter. Heart: Regular rate and rhythm, without murmurs, rubs, gallops. Lungs: Clear to auscultation bilaterally. Abdomen: Soft, nontender, nondistended, positive bowel sounds. Extremities: Bilateral lymphedema. On the left he has erythema to about 2 inches below his knee down to his foot. Neuro: Grossly intact, nonfocal.    Lab Results: Basic Metabolic Panel:  Basename 10/18/10 0626 10/17/10 0622  NA 138 136  K 3.8 4.2  CL 100 97  CO2 30 30  GLUCOSE 131* 127*  BUN 48* 50*  CREATININE 2.54* 3.06*  CALCIUM 7.5* 7.7*  MG -- --  PHOS -- 4.3   Liver Function Tests:  Basename 10/16/10 0354  AST 13  ALT 15  ALKPHOS 71  BILITOT 0.8  PROT 6.3  ALBUMIN 2.9*     CBC:  Basename 10/16/10 0354  WBC 13.7*  NEUTROABS --  HGB 11.7*  HCT 37.6*  MCV 89.3  PLT 134*    Recent Results (from the past 240 hour(s))  MRSA PCR SCREENING     Status: Normal   Collection Time   10/15/10  8:18 AM      Component Value Range Status Comment   MRSA by PCR NEGATIVE  NEGATIVE  Final   URINE CULTURE     Status: Normal   Collection Time   10/15/10 10:30 PM      Component Value Range Status Comment   Specimen Description URINE, CLEAN CATCH   Final    Special Requests NONE   Final    Setup Time [id number]   Final    Colony Count NO GROWTH   Final    Culture NO GROWTH   Final      Report Status 10/17/2010 FINAL   Final     Studies/Results: US Renal  10/16/2010  *RADIOLOGY REPORT*  Clinical Data: Elevated BUN and creatinine.  Renal failure.  RENAL/URINARY TRACT ULTRASOUND COMPLETE  Comparison:  07/22/2008.  Findings:  Right Kidney:  13 cm.  Echogenic renal cortex with renal cortical thinning.  No obstruction or hydronephrosis.  No calculi.  Left Kidney:  11.6 cm.  Echogenic renal cortex with renal cortical thinning.  Anechoic simple cyst is present in the inferior pole measuring 80 mm x 17 mm x 20 mm.  Bladder:  Decompressed urinary bladder.  As such, not evaluated.  IMPRESSION:  1.  Bilateral renal cortical thinning with echogenic cortex compatible with medical renal disease. 2.  Simple left inferior pole renal cyst measuring 20 mm. 3.  Decompressed urinary bladder.  Original Report Authenticated By: Andreas Newport, M.D.    Medications: Scheduled Meds:   . aspirin EC  81 mg Oral Daily  . diltiazem  60 mg Oral BID  . doxazosin  4 mg Oral QHS  . enoxaparin (LOVENOX) injection  80 mg Subcutaneous Daily  . febuxostat  40 mg Oral Daily  . fenofibrate  54 mg Oral Daily  .  Flora-Q  2 capsule Oral BID  . folic acid  1 mg Oral Daily  . piperacillin-tazobactam (ZOSYN)  IV  3.375 g Intravenous Q8H  . torsemide  50 mg Oral Daily  . vancomycin  1,500 mg Intravenous Q24H  . DISCONTD: diltiazem  120 mg Oral Daily  . DISCONTD: furosemide  40 mg Intravenous Daily   Continuous Infusions:   . DISCONTD: sodium chloride 0.9 % 1,000 mL infusion 75 mL/hr at 10/17/10 2205   PRN Meds:.acetaminophen, acetaminophen, bisacodyl, HYDROmorphone, ondansetron (ZOFRAN) IV, ondansetron, oxyCODONE, polyethylene glycol, promethazine, promethazine, traZODone  Assessment/Plan:  Principal Problem:  *Cellulitis of leg, left Active Problems:  Acute on chronic kidney disease, stage 3  Morbid obesity  DJD (degenerative joint disease)  Lymphedema of lower extremity  #1 left leg cellulitis:  Improving. We'll give 24-48 more hours of IV antibiotics. Then consider transitioning to an oral agent such as doxycycline for an extra 10-14 days upon discharge. #2 acute on chronic kidney disease stage III: Creatinine is now back at baseline. Dr. Fausto Skillern is following and adjusting diuretics. He has been transitioned to oral Demadex today. #3 disposition: We'll plan on going home possibly tomorrow if no overnight events and cellulitis appears improved.   LOS: 3 days   Alex Petty,Alex Petty 10/18/2010, 9:59 AM

## 2010-10-19 LAB — CBC
HCT: 34.4 % — ABNORMAL LOW (ref 39.0–52.0)
MCH: 27.5 pg (ref 26.0–34.0)
MCHC: 30.8 g/dL (ref 30.0–36.0)
MCV: 89.4 fL (ref 78.0–100.0)
RDW: 18.4 % — ABNORMAL HIGH (ref 11.5–15.5)

## 2010-10-19 LAB — BASIC METABOLIC PANEL
BUN: 43 mg/dL — ABNORMAL HIGH (ref 6–23)
CO2: 28 mEq/L (ref 19–32)
Chloride: 98 mEq/L (ref 96–112)
Creatinine, Ser: 2.35 mg/dL — ABNORMAL HIGH (ref 0.50–1.35)

## 2010-10-19 MED ORDER — ASPIRIN 81 MG PO TBEC: 81.0000 mg | DELAYED_RELEASE_TABLET | Freq: Every day | ORAL | Status: AC

## 2010-10-19 MED ORDER — OXYCODONE HCL 5 MG PO TABS: 5.0000 mg | ORAL_TABLET | ORAL | Status: AC | PRN

## 2010-10-19 MED ORDER — TORSEMIDE 10 MG PO TABS: 50.0000 mg | ORAL_TABLET | Freq: Every day | ORAL | Status: DC

## 2010-10-19 MED ORDER — DOXYCYCLINE HYCLATE 100 MG PO TABS
100.0000 mg | ORAL_TABLET | Freq: Two times a day (BID) | ORAL | Status: DC
  Administered 2010-10-19: 100 mg via ORAL
  Filled 2010-10-19: qty 1

## 2010-10-19 MED ORDER — DOXYCYCLINE HYCLATE 100 MG PO TABS: 100.0000 mg | ORAL_TABLET | Freq: Two times a day (BID) | ORAL | Status: AC

## 2010-10-19 NOTE — Progress Notes (Signed)
#  1. Left leg cellulitis: much improved. No weeping, less pain. Vanc and Zosyn day #5. Will dc vanc and zosyn and start doxycycline for 14 days.  #2. Acute on chronic kidney disease stage III. Creatinine at baseline. Dr. Fausto Skillern following.  #3. Pt. Probable discharge to home today.

## 2010-10-19 NOTE — Progress Notes (Signed)
Pt d/c'd with instructions, prescriptions, and carenotes.  Pt and wife verbalizes understanding and voices no further complaints or concerns at this time. Pt left the floor with staff and family in stable condition.

## 2010-10-19 NOTE — Discharge Summary (Signed)
Physician Discharge Summary  Patient ID: Alex Petty MRN: [id number] DOB/AGE: 66-Nov-1947 66 y.o.  Admit date: 10/15/2010 Discharge date: 10/19/2010  Primary Care Physician:  Dwana Melena, MD   Discharge Diagnoses:    Present on Admission:  .Cellulitis of leg, left .Acute on chronic kidney disease, stage 3 .Morbid obesity .DJD (degenerative joint disease) .Lymphedema of lower extremity  Current Discharge Medication List    START taking these medications   Details  aspirin EC 81 MG EC tablet Take 1 tablet (81 mg total) by mouth daily. Qty: 30 tablet, Refills: 3    doxycycline (VIBRA-TABS) 100 MG tablet Take 1 tablet (100 mg total) by mouth every 12 (twelve) hours. Qty: 28 tablet, Refills: 0    oxyCODONE (OXY IR/ROXICODONE) 5 MG immediate release tablet Take 1 tablet (5 mg total) by mouth every 4 (four) hours as needed. Qty: 30 tablet, Refills: 0      CONTINUE these medications which have CHANGED   Details  torsemide (DEMADEX) 10 MG tablet Take 5 tablets (50 mg total) by mouth daily. Qty: 150 tablet, Refills: 0      CONTINUE these medications which have NOT CHANGED   Details  Chondroitin Sulfate 250 MG CAPS Take 1 capsule by mouth daily.      diltiazem (CARDIZEM CD) 120 MG 24 hr capsule Take 120 mg by mouth daily.      doxazosin (CARDURA) 4 MG tablet Take 4 mg by mouth at bedtime.      febuxostat (ULORIC) 40 MG tablet Take 40 mg by mouth daily.      fenofibrate 54 MG tablet Take 54 mg by mouth daily.      Glucosamine-Chondroit-Vit C-Mn (GLUCOSAMINE 1500 COMPLEX PO) Take by mouth.      Multiple Vitamins-Minerals (MULTIVITAMINS THER. W/MINERALS) TABS Take 1 tablet by mouth daily.        STOP taking these medications     diclofenac (VOLTAREN) 50 MG EC tablet      Glucosamine-Chondroit-Vit C-Mn (GLUCOSAMINE CHONDR 1500 COMPLX PO)      indomethacin (INDOCIN) 50 MG capsule      indomethacin (INDOCIN) 50 MG capsule      Multiple Vitamin (MULTIVITAMIN)  capsule      naproxen sodium (ANAPROX) 220 MG tablet          Disposition and Follow-up: Patient will be discharged home today in stable and improved condition. He has been asked to call his primary care physician Dr. Margo Aye and schedule followup appointment in no more than 2 weeks.  Consults:  nephrology Dr. Fausto Skillern   Significant Diagnostic Studies:  No results found.  Brief H and P: For complete details please refer to admission H and P, but in brief patient is a pleasant morbidly obese 65 year old white man with a history of chronic kidney disease who presented to the hospital with left leg pain and discharge. Had been treated with an outpatient course of Keflex and failed because of this was admitted to our service for further evaluation and management.    Hospital Course:  Principal Problem:  *Cellulitis of leg, left Active Problems:  Acute on chronic kidney disease, stage 3  Morbid obesity  DJD (degenerative joint disease)  Lymphedema of lower extremity   #1 left leg cellulitis: Placed on IV vancomycin and Zosyn. He has responded well to antibiotic therapy. I believe he is ready for discharge home today. We'll give him a 14 day course of doxycycline and he will need to followup with his PCP in 2 weeks. #2  acute on chronic kidney disease stage III:  He follows up outpatient with Dr. Fausto Skillern.we have asked him to refrain from NSAID use, his creatinine is back to his baseline at time of discharge.   Time spent on Discharge: Greater than 30 minutes   Signed: HERNANDEZ ACOSTA,ESTELA 10/19/2010, 11:02 AM

## 2010-10-19 NOTE — Progress Notes (Signed)
Subjective: Interval History: has no complaint of nausea or vomiting. He denies also any shortness of breath. Overall he's feeling better..  Objective: Vital signs in last 24 hours: Temp:  [97.9 F (36.6 C)-98.8 F (37.1 C)] 98.2 F (36.8 C) (10/08 0545) Pulse Rate:  [60-70] 60  (10/08 0545) Resp:  [18-20] 20  (10/08 0545) BP: (113-129)/(63-75) 122/63 mmHg (10/08 0545) SpO2:  [86 %-96 %] 86 % (10/08 0918) Weight change:   Intake/Output from previous day: 10/07 0701 - 10/08 0700 In: 2724 [P.O.:1120; IV Piggyback:1604] Out: 1600 [Urine:1600] Intake/Output this shift:    General appearance: alert Resp: clear to auscultation bilaterally Cardio: regular rate and rhythm, S1, S2 normal, no murmur, click, rub or gallop GI: soft, non-tender; bowel sounds normal; no masses,  no organomegaly Extremities: extremities normal, atraumatic, no cyanosis or edema  Lab Results:  [facility] 10/19/10 0548  WBC 9.6  HGB 10.6*  HCT 34.4*  PLT 139*   BMET:  Basename 10/19/10 0548 10/18/10 0626  NA 135 138  K 3.8 3.8  CL 98 100  CO2 28 30  GLUCOSE 121* 131*  BUN 43* 48*  CREATININE 2.35* 2.54*  CALCIUM 7.7* 7.5*   No results found for this basename: PTH:2 in the last 72 hours Iron Studies: No results found for this basename: IRON,TIBC,TRANSFERRIN,FERRITIN in the last 72 hours  Studies/Results: No results found.  I have reviewed the patient's current medications.  Assessment/Plan: Problem #1 acute kidney injury superimposed on chronic his pending creatinine has returned to his baseline. And patient doesn't have any uremic sinus symptoms. Problem #2 history of anemia H&H stable Problem #3 history of cellulitis of the leg patient febrile with normal white blood cell count Problem #4 history of hypertension blood pressures seems to be controlled very well Problem 5 history of obesity Problem #6 history of degenerative joint disease Recommendation we'll follow patient in 3 to 4  weeks    LOS: 4 days   Alex Petty S 10/19/2010,11:09 AM

## 2010-12-28 ENCOUNTER — Encounter (HOSPITAL_COMMUNITY): Payer: Self-pay | Admitting: Pharmacy Technician

## 2011-01-06 ENCOUNTER — Other Ambulatory Visit: Payer: Self-pay

## 2011-01-06 ENCOUNTER — Encounter (HOSPITAL_COMMUNITY)
Admission: RE | Admit: 2011-01-06 | Discharge: 2011-01-06 | Disposition: A | Discharge status: Home / Self Care | Payer: Medicare Other | Source: Ambulatory Visit | Attending: Ophthalmology | Admitting: Ophthalmology

## 2011-01-06 ENCOUNTER — Encounter (HOSPITAL_COMMUNITY): Payer: Self-pay

## 2011-01-06 HISTORY — DX: Dorsalgia, unspecified: M54.9

## 2011-01-06 HISTORY — DX: Other chronic pain: G89.29

## 2011-01-06 LAB — BASIC METABOLIC PANEL
Calcium: 9.6 mg/dL (ref 8.4–10.5)
GFR calc non Af Amer: 32 mL/min — ABNORMAL LOW (ref >90)
Glucose, Bld: 105 mg/dL — ABNORMAL HIGH (ref 70–99)
Sodium: 141 mEq/L (ref 135–145)

## 2011-01-06 LAB — CBC
MCH: 27.5 pg (ref 26.0–34.0)
MCHC: 31.2 g/dL (ref 30.0–36.0)
Platelets: 158 10*3/uL (ref 150–400)
RDW: 17.8 % — ABNORMAL HIGH (ref 11.5–15.5)

## 2011-01-06 NOTE — Patient Instructions (Addendum)
Cataract A cataract is a clouding of the lens of the eye. It is most often related to aging. A cataract is not a "film" over the surface of the eye. The lens is inside the eye and changes size of the pupil. The lens can enlarge to let more light enter the eye in dark environments and contract the size of the pupil to let in bright light. The lens is the part of the eye that helps focus light on the retina. The retina is the eye's light-sensitive layer. It is in the back of the eye that sends visual signals to the brain. In a normal eye, light passes through the lens and gets focused on the retina. To help produce a sharp image, the lens must remain clear. When a lens becomes cloudy, vision is compromised by the degree and nature of the clouding. Certain cataracts make people more near-sighted as they develop, others increase glare, and all reduce vision to some degree or another. A cataract that is so dense that it becomes milky white and a white opacity can be seen through the pupil. When the white color is seen, it is called a "mature" or "hyper-mature cataract." Such cataracts cause total blindness in the affected eye. The cataract must be removed to prevent damage to the eye itself. Some types of cataracts can cause a secondary disease of the eye, such as certain types of glaucoma. In the early stages, better lighting and eyeglasses may lessen vision problems caused by cataracts. At a certain point, surgery may be needed to improve vision. CAUSES   Aging. However, cataracts may occur at any age, even in newborns.   Certain drugs.   Trauma to the eye.   Certain diseases (such as diabetes).   Inherited or acquired medical syndromes.  SYMPTOMS   Gradual, progressive drop in vision in the affected eye. Cataracts may develop at different rates in each eye. Cataracts may even be in just one eye with the other unaffected.   Cataracts due to trauma may develop quickly, sometimes over a matter or days  or even hours. The result is severe and rapid visual loss.  DIAGNOSIS  To detect a cataract, an eye doctor examines the lens. A well developed cataract can be diagnosed without dilating the pupil. Early cataracts and others of a specific nature are best diagnosed with an exam of the eyes with the pupils dilated by drops. TREATMENT   For an early cataract, vision may improve by using different eyeglasses or stronger lighting.   If the above measures do not help, surgery is the only effective treatment. This treatment removes the cloudy lens and replaces it with a substitute lens (Intraocular lens, or IOL). Newly developed IOL technology allows the implanted lens to improve vision both at a distance and up close. Discuss with your eye surgeon about the possibility of still needing glasses. Also discuss how visual coordination between both eyes will be affected.  A cataract needs to be removed only when vision loss interferes with your everyday activities such as driving, reading or watching TV. You and your eye doctor can make that decision together. In most cases, waiting until you are ready to have cataract surgery will not harm your eye. If you have cataracts in both eyes, only one should be removed at a time. This allows the operated eye to heal and be out of danger from serious problems (such as infection or poor wound healing) before having the other eye undergo surgery.  Sometimes, a cataract should be removed even if it does not cause problems with your vision. For example, a cataract should be removed if it prevents examination or treatment of another eye problem. Just as you cannot see out of the affected eye well, your doctor cannot see into your eye well through a cataract. The vast majority of people who have cataract surgery have better vision afterward. CATARACT REMOVAL There are two primary ways to remove a cataract. Your doctor can explain the differences and help determine which is best  for you:  Phacoemulsification (small incision cataract surgery). This involves making a small cut (incision) on the edge of the clear, dome-shaped surface that covers the front of the eye (the cornea). An injection behind the eye or eye drops are given to make this a painless procedure. The doctor then inserts a tiny probe into the eye. This device emits ultrasound waves that soften and break up the cloudy center of the lens so it can be removed by suction. Most cataract surgery is done this way. The cuts are usually so small and performed in such a manner that often no sutures are needed to keep it closed.   Extracapsular surgery. Your doctor makes a slightly longer incision on the side of the cornea. The doctor removes the hard center of the lens. The remainder of the lens is then removed by suction. In some cases, extremely fine sutures are needed which the doctor may, or may not remove in the office after the surgery.  When an IOL is implanted, it needs no care. It becomes a permanent part of your eye and cannot be seen or felt.  Some people cannot have an IOL. They may have problems during surgery, or maybe they have another eye disease. For these people, a soft contact lens may be suggested. If an IOL or contact lens cannot be used, very powerful and thick glasses are required after surgery. Since vision is very different through such thick glasses, it is important to have your doctor discuss the impact on20 Alex Petty  01/06/2011   Your procedure is scheduled on:  01/14/2011  Report to [facility] at  1030  AM.  Call this number if you have problems the morning of surgery: [phone]   Remember:   Do not eat food:After Midnight.  May have clear liquids:until Midnight .  Clear liquids include soda, tea, black coffee, apple or grape juice, broth.  Take these medicines the morning of surgery with A SIP OF WATER: diltizem,doxizosin,indomethacin,uloric   Do not wear jewelry, make-up or nail  polish.  Do not wear lotions, powders, or perfumes. You may wear deodorant.  Do not shave 48 hours prior to surgery.  Do not bring valuables to the hospital.  Contacts, dentures or bridgework may not be worn into surgery.  Leave suitcase in the car. After surgery it may be brought to your room.  For patients admitted to the hospital, checkout time is 11:00 AM the day of discharge.   Patients discharged the day of surgery will not be allowed to drive home.  Name and phone number of your driver: family  Special Instructions: N/A   Please read over the following fact sheets that you were given: Pain Booklet, Surgical Site Infection Prevention, Anesthesia Post-op Instructions and Care and Recovery After Surgery  your vision after any cataract surgery where there is no plan to implant an IOL. The normal lens of the eye is covered by a clear capsule. Both  phacoemulsification and extracapsular surgery require that the back surface of this lens capsule be left in place. This helps support IOLs and prevents the IOL from dislocating and falling back into the deeper interior of the eye. Right after surgery, and often permanently this "posterior capsule" remains clear. In some cases however, it can become cloudy, presenting the same type of visual compromise that the original cataract did since light is again obstructed as it passes through the clear IOL. This condition is often referred to as an "after-cataract." Fortunately, after-cataracts are easily treated using a painless and very fast laser treatment that is performed without anesthesia or incisions. It is done in a matter of minutes in an outpatient environment. Visual improvement is often immediate.  HOME CARE INSTRUCTIONS   Your surgeon will discuss pre and post operative care with you prior to surgery. The majority of people are able to do almost all normal activities right away. Although, it is often advised to avoid strenuous activity for a period  of time.   Postoperative drops and careful avoidance of infection will be needed. Many surgeons suggest the use of a protective shield during the first few days after surgery.   There is a very small incidence of complication from modern cataract surgery, but it can happen. Infection that spreads to the inside of the eye (endophthalmitis) can result in total visual loss and even loss of the eye itself. In extremely rare instances, the inflammation of endophthalmitis can spread to both eyes (sympathetic ophthalmia). Appropriate post-operative care under the close observation of your surgeon is essential to a successful outcome.  SEEK IMMEDIATE MEDICAL CARE IF:   You have any sudden drop of vision in the operated eye.   You have pain in the operated eye.   You see a large number of floating dots in the field of vision in the operated eye.   You see flashing lights, or if a portion of your side vision in any direction appears black (like a curtain being drawn into your field of vision) in the operated eye.  Document Released: 12/28/2004 Document Revised: 09/09/2010 Document Reviewed: 02/13/2007 [facility] Patient Information 2012 [place], [place].PATIENT INSTRUCTIONS POST-ANESTHESIA  IMMEDIATELY FOLLOWING SURGERY:  Do not drive or operate machinery for the first twenty four hours after surgery.  Do not make any important decisions for twenty four hours after surgery or while taking narcotic pain medications or sedatives.  If you develop intractable nausea and vomiting or a severe headache please notify your doctor immediately.  FOLLOW-UP:  Please make an appointment with your surgeon as instructed. You do not need to follow up with anesthesia unless specifically instructed to do so.  WOUND CARE INSTRUCTIONS (if applicable):  Keep a dry clean dressing on the anesthesia/puncture wound site if there is drainage.  Once the wound has quit draining you may leave it open to air.  Generally you should leave  the bandage intact for twenty four hours unless there is drainage.  If the epidural site drains for more than 36-48 hours please call the anesthesia department.  QUESTIONS?:  Please feel free to call your physician or the hospital operator if you have any questions, and they will be happy to assist you.     [facility] Anesthesia Department [address] [place] [phone]

## 2011-01-14 ENCOUNTER — Encounter (HOSPITAL_COMMUNITY): Payer: Self-pay | Admitting: Ophthalmology

## 2011-01-14 ENCOUNTER — Encounter (HOSPITAL_COMMUNITY)
Admission: RE | Disposition: A | Discharge status: Home / Self Care | Payer: Self-pay | Source: Ambulatory Visit | Attending: Ophthalmology

## 2011-01-14 ENCOUNTER — Encounter (HOSPITAL_COMMUNITY): Payer: Self-pay | Admitting: Anesthesiology

## 2011-01-14 ENCOUNTER — Ambulatory Visit (HOSPITAL_COMMUNITY)
Admission: RE | Admit: 2011-01-14 | Discharge: 2011-01-14 | Disposition: A | Discharge status: Home / Self Care | Payer: Medicare Other | Source: Ambulatory Visit | Attending: Ophthalmology | Admitting: Ophthalmology

## 2011-01-14 ENCOUNTER — Ambulatory Visit (HOSPITAL_COMMUNITY): Payer: Medicare Other | Admitting: Anesthesiology

## 2011-01-14 ENCOUNTER — Encounter (HOSPITAL_COMMUNITY): Payer: Self-pay | Admitting: *Deleted

## 2011-01-14 DIAGNOSIS — H2589 Other age-related cataract: Secondary | ICD-10-CM | POA: Insufficient documentation

## 2011-01-14 DIAGNOSIS — I10 Essential (primary) hypertension: Secondary | ICD-10-CM | POA: Insufficient documentation

## 2011-01-14 DIAGNOSIS — Z01812 Encounter for preprocedural laboratory examination: Secondary | ICD-10-CM | POA: Insufficient documentation

## 2011-01-14 DIAGNOSIS — Z79899 Other long term (current) drug therapy: Secondary | ICD-10-CM | POA: Insufficient documentation

## 2011-01-14 DIAGNOSIS — Z01810 Encounter for preprocedural cardiovascular examination: Secondary | ICD-10-CM | POA: Insufficient documentation

## 2011-01-14 HISTORY — PX: CATARACT EXTRACTION W/PHACO: SHX586

## 2011-01-14 SURGERY — PHACOEMULSIFICATION, CATARACT, WITH IOL INSERTION
Anesthesia: Monitor Anesthesia Care | Site: Eye | Laterality: Left | Wound class: Clean

## 2011-01-14 MED ORDER — LIDOCAINE HCL 3.5 % OP GEL
1.0000 "application " | Freq: Once | OPHTHALMIC | Status: AC
  Administered 2011-01-14: 1 via OPHTHALMIC

## 2011-01-14 MED ORDER — LIDOCAINE HCL (PF) 1 % IJ SOLN
INTRAMUSCULAR | Status: AC
  Filled 2011-01-14: qty 2

## 2011-01-14 MED ORDER — STERILE WATER FOR IRRIGATION IR SOLN
Status: DC | PRN
  Administered 2011-01-14: 100 mL

## 2011-01-14 MED ORDER — PHENYLEPHRINE HCL 2.5 % OP SOLN
1.0000 [drp] | OPHTHALMIC | Status: AC
  Administered 2011-01-14 (×3): 1 [drp] via OPHTHALMIC

## 2011-01-14 MED ORDER — BSS IO SOLN
INTRAOCULAR | Status: DC | PRN
  Administered 2011-01-14: 15 mL via INTRAOCULAR

## 2011-01-14 MED ORDER — LIDOCAINE HCL (PF) 1 % IJ SOLN
INTRAOCULAR | Status: DC | PRN
  Administered 2011-01-14: 12:00:00 via OPHTHALMIC

## 2011-01-14 MED ORDER — ONDANSETRON HCL 4 MG/2ML IJ SOLN: 4.0000 mg | Freq: Once | INTRAMUSCULAR | Status: DC | PRN

## 2011-01-14 MED ORDER — EPINEPHRINE HCL 1 MG/ML IJ SOLN
INTRAOCULAR | Status: DC | PRN
  Administered 2011-01-14: 13:00:00

## 2011-01-14 MED ORDER — FENTANYL CITRATE 0.05 MG/ML IJ SOLN: 25.0000 ug | INTRAMUSCULAR | Status: DC | PRN

## 2011-01-14 MED ORDER — TETRACAINE HCL 0.5 % OP SOLN
1.0000 [drp] | OPHTHALMIC | Status: AC
  Administered 2011-01-14 (×3): 1 [drp] via OPHTHALMIC

## 2011-01-14 MED ORDER — LACTATED RINGERS IV SOLN
INTRAVENOUS | Status: DC
  Administered 2011-01-14: 1000 mL via INTRAVENOUS

## 2011-01-14 MED ORDER — TETRACAINE HCL 0.5 % OP SOLN
OPHTHALMIC | Status: AC
  Administered 2011-01-14: 1 [drp] via OPHTHALMIC
  Filled 2011-01-14: qty 2

## 2011-01-14 MED ORDER — MIDAZOLAM HCL 2 MG/2ML IJ SOLN
1.0000 mg | INTRAMUSCULAR | Status: DC | PRN
Start: 2011-01-14 — End: 2011-01-14
  Administered 2011-01-14: 2 mg via INTRAVENOUS

## 2011-01-14 MED ORDER — CYCLOPENTOLATE-PHENYLEPHRINE 0.2-1 % OP SOLN
OPHTHALMIC | Status: AC
  Administered 2011-01-14: 1 [drp] via OPHTHALMIC
  Filled 2011-01-14: qty 2

## 2011-01-14 MED ORDER — LIDOCAINE HCL 3.5 % OP GEL
OPHTHALMIC | Status: AC
  Administered 2011-01-14: 1 via OPHTHALMIC
  Filled 2011-01-14: qty 5

## 2011-01-14 MED ORDER — NEOMYCIN-POLYMYXIN-DEXAMETH 0.1 % OP OINT
TOPICAL_OINTMENT | OPHTHALMIC | Status: DC | PRN
  Administered 2011-01-14: 1 via OPHTHALMIC

## 2011-01-14 MED ORDER — PROVISC 10 MG/ML IO SOLN
INTRAOCULAR | Status: DC | PRN
  Administered 2011-01-14: 8.5 mg via INTRAOCULAR

## 2011-01-14 MED ORDER — LIDOCAINE 3.5 % OP GEL OPTIME - NO CHARGE
OPHTHALMIC | Status: DC | PRN
  Administered 2011-01-14: 1 [drp] via OPHTHALMIC

## 2011-01-14 MED ORDER — MIDAZOLAM HCL 2 MG/2ML IJ SOLN
INTRAMUSCULAR | Status: AC
  Administered 2011-01-14: 2 mg via INTRAVENOUS
  Filled 2011-01-14: qty 2

## 2011-01-14 MED ORDER — NEOMYCIN-POLYMYXIN-DEXAMETH 3.5-10000-0.1 OP OINT
TOPICAL_OINTMENT | OPHTHALMIC | Status: AC
  Filled 2011-01-14: qty 3.5

## 2011-01-14 MED ORDER — POVIDONE-IODINE 5 % OP SOLN
OPHTHALMIC | Status: DC | PRN
  Administered 2011-01-14: 1 via OPHTHALMIC

## 2011-01-14 MED ORDER — CYCLOPENTOLATE-PHENYLEPHRINE 0.2-1 % OP SOLN
1.0000 [drp] | OPHTHALMIC | Status: AC
  Administered 2011-01-14 (×3): 1 [drp] via OPHTHALMIC

## 2011-01-14 MED ORDER — PHENYLEPHRINE HCL 2.5 % OP SOLN
OPHTHALMIC | Status: AC
  Administered 2011-01-14: 1 [drp] via OPHTHALMIC
  Filled 2011-01-14: qty 2

## 2011-01-14 MED ORDER — EPINEPHRINE HCL 1 MG/ML IJ SOLN
INTRAMUSCULAR | Status: AC
  Filled 2011-01-14: qty 1

## 2011-01-14 SURGICAL SUPPLY — 32 items

## 2011-01-14 NOTE — Transfer of Care (Signed)
Immediate Anesthesia Transfer of Care Note  Patient: Alex Petty  Procedure(s) Performed:  CATARACT EXTRACTION PHACO AND INTRAOCULAR LENS PLACEMENT (IOC) - CDE=16.27  Patient Location: PACU and Short Stay  Anesthesia Type: MAC  Level of Consciousness: awake, alert , oriented and patient cooperative  Airway & Oxygen Therapy: Patient Spontanous Breathing  Post-op Assessment: Report given to PACU RN, Post -op Vital signs reviewed and stable and Patient moving all extremities  Post vital signs: Reviewed and stable  Complications: No apparent anesthesia complications

## 2011-01-14 NOTE — Anesthesia Postprocedure Evaluation (Signed)
  Anesthesia Post-op Note  Patient: Alex Petty  Procedure(s) Performed:  CATARACT EXTRACTION PHACO AND INTRAOCULAR LENS PLACEMENT (IOC) - CDE=16.27  Patient Location: PACU and Short Stay  Anesthesia Type: MAC  Level of Consciousness: awake, alert , oriented and patient cooperative  Airway and Oxygen Therapy: Patient Spontanous Breathing  Post-op Pain: none  Post-op Assessment: Post-op Vital signs reviewed, Patient's Cardiovascular Status Stable, Respiratory Function Stable, Patent Airway and No signs of Nausea or vomiting  Post-op Vital Signs: Reviewed and stable  Complications: No apparent anesthesia complications

## 2011-01-14 NOTE — Brief Op Note (Signed)
Pre-Op Dx: Cataract OS Post-Op Dx: Cataract OS Surgeon: Amiyrah Lamere Anesthesia: Topical with MAC Implant: Alcon [id number] Specimen: None Complications: None 

## 2011-01-14 NOTE — H&P (Signed)
I have reviewed the H&P, the patient was re-examined, and I have identified no interval changes in medical condition and plan of care since the history and physical of record  

## 2011-01-14 NOTE — Anesthesia Preprocedure Evaluation (Addendum)
Anesthesia Evaluation  Patient identified by MRN, date of birth, ID band Patient awake    Reviewed: Allergy & Precautions, H&P , NPO status , Patient's Chart, lab work & pertinent test results  Airway Mallampati: III      Dental  (+) Teeth Intact   Pulmonary  clear to auscultation        Cardiovascular hypertension, + dysrhythmias (irreg HR hx) Regular Normal    Neuro/Psych    GI/Hepatic GERD-  ,  Endo/Other    Renal/GU      Musculoskeletal  (+) Arthritis - (Gout),   Abdominal   Peds  Hematology   Anesthesia Other Findings   Reproductive/Obstetrics                           Anesthesia Physical Anesthesia Plan  ASA: III  Anesthesia Plan: MAC   Post-op Pain Management:    Induction: Intravenous  Airway Management Planned: Nasal Cannula  Additional Equipment:   Intra-op Plan:   Post-operative Plan:   Informed Consent: I have reviewed the patients History and Physical, chart, labs and discussed the procedure including the risks, benefits and alternatives for the proposed anesthesia with the patient or authorized representative who has indicated his/her understanding and acceptance.     Plan Discussed with:   Anesthesia Plan Comments:         Anesthesia Quick Evaluation

## 2011-01-15 NOTE — Op Note (Signed)
NAME:  Alex Petty, Alex Petty             ACCOUNT NO.:  [id number]  MEDICAL RECORD NO.:  [id number]  LOCATION:  [facility]                          FACILITY:  [facility]  PHYSICIAN:  Susanne Greenhouse, MD       DATE OF BIRTH:  [birth year]  DATE OF PROCEDURE:  01/14/2011 DATE OF DISCHARGE:  01/14/2011                              OPERATIVE REPORT   PREOPERATIVE DIAGNOSIS:  Combined cataract, left eye, diagnosis code 366.19.  POSTOPERATIVE DIAGNOSIS:  Combined cataract, left eye, diagnosis code 366.19.  OPERATION PERFORMED:  Phacoemulsification with posterior chamber intraocular lens implantation, left eye.  SURGEON:  Bonne Dolores. Darek Eifler, MD  ANESTHESIA:  General endotracheal anesthesia.  OPERATIVE SUMMARY:  In the preoperative area, dilating drops were placed into the left eye.  The patient was then brought into the operating room where he was placed under general anesthesia.  The eye was then prepped and draped.  Beginning with a 75 blade, a paracentesis port was made at the surgeon's 2 o'clock position.  The anterior chamber was then filled with a 1% nonpreserved lidocaine solution with epinephrine.  This was followed by Viscoat to deepen the chamber.  A small fornix-based peritomy was performed superiorly.  Next, a single iris hook was placed through the limbus superiorly.  A 2.4-mm keratome blade was then used to make a clear corneal incision over the iris hook.  A bent cystotome needle and Utrata forceps were used to create a continuous tear capsulotomy.  Hydrodissection was performed using balanced salt solution on a fine cannula.  The lens nucleus was then removed using phacoemulsification in a quadrant cracking technique.  The cortical material was then removed with irrigation and aspiration.  The capsular bag and anterior chamber were refilled with Provisc.  The wound was widened to approximately 3 mm and a posterior chamber intraocular lens was placed into the capsular bag without difficulty using  an [employer] lens injecting system.  A single 10-0 nylon suture was then used to close the incision as well as stromal hydration.  The Provisc was removed from the anterior chamber and capsular bag with irrigation and aspiration.  At this point, the wounds were tested for leak, which were negative.  The anterior chamber remained deep and stable.  The patient tolerated the procedure well.  There were no operative complications, and he awoke from general anesthesia without problem.  No surgical specimens.  Prosthetic device used is an [employer]con AcrySof, posterior chamber lens, model SN60WS, power of 22.0, serial number is [id number].          ______________________________ Susanne Greenhouse, MD     KEH/MEDQ  D:  01/14/2011  T:  01/15/2011  Job:  [id number]

## 2011-01-18 ENCOUNTER — Encounter (HOSPITAL_COMMUNITY): Payer: Self-pay | Admitting: Ophthalmology

## 2012-05-30 ENCOUNTER — Other Ambulatory Visit (HOSPITAL_COMMUNITY): Payer: Self-pay | Admitting: Internal Medicine

## 2012-05-31 ENCOUNTER — Ambulatory Visit (HOSPITAL_COMMUNITY): Payer: Medicare Other

## 2012-06-01 ENCOUNTER — Ambulatory Visit (HOSPITAL_COMMUNITY)
Admit: 2012-06-01 | Discharge: 2012-06-01 | Disposition: A | Discharge status: Home / Self Care | Payer: Medicare Other | Source: Ambulatory Visit | Attending: Internal Medicine | Admitting: Internal Medicine

## 2012-06-01 ENCOUNTER — Ambulatory Visit (HOSPITAL_COMMUNITY): Admit: 2012-06-01 | Payer: Medicare Other

## 2012-06-01 DIAGNOSIS — R220 Localized swelling, mass and lump, head: Secondary | ICD-10-CM | POA: Insufficient documentation

## 2012-06-01 DIAGNOSIS — R599 Enlarged lymph nodes, unspecified: Secondary | ICD-10-CM | POA: Insufficient documentation

## 2012-06-09 ENCOUNTER — Other Ambulatory Visit (HOSPITAL_COMMUNITY): Payer: Self-pay | Admitting: Internal Medicine

## 2012-06-09 ENCOUNTER — Encounter (HOSPITAL_COMMUNITY): Payer: Medicare Other | Attending: Oncology | Admitting: Oncology

## 2012-06-09 ENCOUNTER — Encounter (HOSPITAL_COMMUNITY): Payer: Self-pay | Admitting: Oncology

## 2012-06-09 ENCOUNTER — Telehealth (HOSPITAL_COMMUNITY): Payer: Self-pay

## 2012-06-09 VITALS — BP 113/70 | HR 97 | Temp 97.6°F | Resp 18 | Ht 69.25 in | Wt 320.3 lb

## 2012-06-09 DIAGNOSIS — R591 Generalized enlarged lymph nodes: Secondary | ICD-10-CM

## 2012-06-09 DIAGNOSIS — R599 Enlarged lymph nodes, unspecified: Secondary | ICD-10-CM | POA: Insufficient documentation

## 2012-06-09 DIAGNOSIS — R161 Splenomegaly, not elsewhere classified: Secondary | ICD-10-CM | POA: Insufficient documentation

## 2012-06-09 DIAGNOSIS — D72829 Elevated white blood cell count, unspecified: Secondary | ICD-10-CM

## 2012-06-09 LAB — CBC WITH DIFFERENTIAL/PLATELET
Band Neutrophils: 0 % (ref 0–10)
Basophils Absolute: 0 10*3/uL (ref 0.0–0.1)
Basophils Relative: 0 % (ref 0–1)
HCT: 33.9 % — ABNORMAL LOW (ref 39.0–52.0)
Hemoglobin: 10.5 g/dL — ABNORMAL LOW (ref 13.0–17.0)
Lymphocytes Relative: 99 % — ABNORMAL HIGH (ref 12–46)
Lymphs Abs: 67.1 10*3/uL — ABNORMAL HIGH (ref 0.7–4.0)
MCHC: 31 g/dL (ref 30.0–36.0)
Monocytes Absolute: 0 10*3/uL — ABNORMAL LOW (ref 0.1–1.0)
Monocytes Relative: 0 % — ABNORMAL LOW (ref 3–12)
WBC: 67.8 10*3/uL (ref 4.0–10.5)

## 2012-06-09 LAB — COMPREHENSIVE METABOLIC PANEL
ALT: 5 U/L (ref 0–53)
AST: 13 U/L (ref 0–37)
Calcium: 9 mg/dL (ref 8.4–10.5)
Sodium: 143 mEq/L (ref 135–145)
Total Protein: 6.6 g/dL (ref 6.0–8.3)

## 2012-06-09 LAB — PROTIME-INR: INR: 1.04 (ref 0.00–1.49)

## 2012-06-09 LAB — LACTATE DEHYDROGENASE: LDH: 183 U/L (ref 94–250)

## 2012-06-09 NOTE — Addendum Note (Signed)
Addended by: Glenford Peers S on: 06/09/2012 02:06 PM   Modules accepted: Orders

## 2012-06-09 NOTE — Patient Instructions (Addendum)
[facility] Discharge Instructions  RECOMMENDATIONS MADE BY THE CONSULTANT AND ANY TEST RESULTS WILL BE SENT TO YOUR REFERRING PHYSICIAN.  EXAM FINDINGS BY THE PHYSICIAN TODAY AND SIGNS OR SYMPTOMS TO REPORT TO CLINIC OR PRIMARY PHYSICIAN: Exam and discussion by MD.  We need to get a biopsy & PET scan to see what's going on.  MEDICATIONS PRESCRIBED:  none  INSTRUCTIONS GIVEN AND DISCUSSED: PET scan will be at [facility].  Don't eat or drink anything 6 hours prior to the scan.  Nothing with sugar - no chewing gum, mints, hard candy etc.  SPECIAL INSTRUCTIONS/FOLLOW-UP: Follow-up in 3 weeks with PA.  Thank you for choosing [facility] to provide your oncology and hematology care.  To afford each patient quality time with our providers, please arrive at least 15 minutes before your scheduled appointment time.  With your help, our goal is to use those 15 minutes to complete the necessary work-up to ensure our physicians have the information they need to help with your evaluation and healthcare recommendations.    Effective January 1st, 2014, we ask that you re-schedule your appointment with our physicians should you arrive 10 or more minutes late for your appointment.  We strive to give you quality time with our providers, and arriving late affects you and other patients whose appointments are after yours.    Again, thank you for choosing [facility].  Our hope is that these requests will decrease the amount of time that you wait before being seen by our physicians.       _____________________________________________________________  Should you have questions after your visit to [facility], please contact our office at [phone] between the hours of 8:30 a.m. and 5:00 p.m.  Voicemails left after 4:30 p.m. will not be returned until the following business day.  For prescription refill requests, have your pharmacy contact  our office with your prescription refill request.

## 2012-06-09 NOTE — Progress Notes (Signed)
#1 diffuse adenopathy in the left cervical, left supra-clavicular, right cervical, right supraclavicular, right and left axillary areas as well as a palpably enlarged spleen.  #2 massive obesity #3 chronic bilateral leg lymphedema with significant skin changes on the left leg in particular. 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 He has not had night sweats, fevers, chills, or 10% weight loss within the last 6 months. This enlargement in his left neck does not cause pain. He is not aware swelling anywhere else. He does have a recent boil in the right axilla treated locally. He is accompanied today by his daughter Foye Clock who works here and his wife. He has only 1 child. He is not a smoker. He is not a drinker.  He is on disability because of his lymphedema and severe degenerative joint disease of his feet for which he wears very special shoes and can hardly walk.  He is retired from working in [employer].  BP 113/70  Pulse 97  Temp(Src) 97.6 F (36.4 C) (Oral)  Resp 18  Ht 5' 9.25" (1.759 m)  Wt 320 lb 4.8 oz (145.287 kg)  BMI 46.96 kg/m2  He has excessive weight. He is in no acute distress. He is alert and oriented. Facial symmetry is intact his left neck is more protuberant in the supraclavicular area in particular but the right also is slightly prominent. He has a large 5 cm to 6 cm cystic-like lesion on his left elbow. He has clear lung fields. His heart is not reveal a murmur rub or  gallop. He does not have to gynecomastia. His massive edema of both legs with significant skin changes of chronic edema in the left leg. He has crepitations over his right knee joint and some decreased range of motion of his left knee joint as well as the right knee joint. He does not have inguinal adenopathy but he does have bilateral axillary, cervical, and supraclavicular adenopathy. The largest masses on the left which is at least 5 cm at the base of the left neck in the supraclavicular fossa but he has other lymph nodes extend up the left neck towards the posterior portion of the ear which measure approximately 3 cm in size. In the right neck there at least 3 cm to 2 cm lymph nodes in several areas including cervical and subclavicular areas. He has 2 right axilla nodes and a 2 x 2 centimeters and 3 x 3 cm respectively. He has at least 2 nodes in the left axilla one of which is at least 3 x 2 cm. He has a palpable spleen 2-3 status post costal margin on the left. I cannot feel his liver. Bowel sounds are diminished but present. His lungs are clear. He does not have any obvious skin lesions that are suspicious for tumor. He has upper and lower dental plates. Throat is clear without enlargement of a tonsillar tissue. I said show changes of cataract surgery bilaterally. He has joint dysfunction and disfigurement and both pains more  the right than the left. These are either DJD in nature or possibly a combination DJD and possibly uric acid disease.  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 He needs a biopsy of the lymph node or 2 with excision rather than needle biopsy. He needs a PET scan and extensive laboratory work. He will probably need rasburicase should he require therapy. With his massive obesity if we can avoid steroids potentially we should.  We of course need to see histology first to determine therapy and the need for therapy but I suspect he will require it in the near future

## 2012-06-09 NOTE — Telephone Encounter (Signed)
CRITICAL VALUE ALERT Critical value received:  WBC 67.8 Date of notification:  06/09/12 Time of notification: 11:10 Critical value read back:  yes Nurse who received alert:  Tobie Lords, RN MD notified :  Dr. Mariel Sleet @ 11:15am

## 2012-06-09 NOTE — Progress Notes (Signed)
ADD: His PBS shows somewhat intermediate sized lymphocytes that are numerous and perhaps shows "Butt" cells suggestive of a CLL picture with more worrisome differentiation or this is a NHL with leukemic involvement. He also has nucleated RBCs We will send off for Flow.

## 2012-06-12 ENCOUNTER — Encounter (HOSPITAL_COMMUNITY): Payer: Medicare Other | Attending: Oncology

## 2012-06-12 ENCOUNTER — Other Ambulatory Visit (HOSPITAL_COMMUNITY)
Admission: RE | Admit: 2012-06-12 | Discharge: 2012-06-12 | Disposition: A | Discharge status: Home / Self Care | Payer: Medicare Other | Source: Ambulatory Visit | Attending: Oncology | Admitting: Oncology

## 2012-06-12 DIAGNOSIS — R599 Enlarged lymph nodes, unspecified: Secondary | ICD-10-CM

## 2012-06-12 DIAGNOSIS — R591 Generalized enlarged lymph nodes: Secondary | ICD-10-CM

## 2012-06-12 DIAGNOSIS — D72829 Elevated white blood cell count, unspecified: Secondary | ICD-10-CM | POA: Insufficient documentation

## 2012-06-12 LAB — BETA 2 MICROGLOBULIN, SERUM: Beta-2 Microglobulin: 9.14 mg/L — ABNORMAL HIGH (ref 1.01–1.73)

## 2012-06-12 NOTE — Progress Notes (Signed)
Labs drawn today for flow cytometry

## 2012-06-13 ENCOUNTER — Ambulatory Visit (HOSPITAL_COMMUNITY): Payer: Medicare Other

## 2012-06-13 ENCOUNTER — Encounter (HOSPITAL_COMMUNITY): Payer: Self-pay

## 2012-06-13 ENCOUNTER — Encounter (HOSPITAL_COMMUNITY)
Admission: RE | Admit: 2012-06-13 | Discharge: 2012-06-13 | Disposition: A | Discharge status: Home / Self Care | Payer: Medicare Other | Source: Ambulatory Visit | Attending: Oncology | Admitting: Oncology

## 2012-06-13 DIAGNOSIS — C8588 Other specified types of non-Hodgkin lymphoma, lymph nodes of multiple sites: Secondary | ICD-10-CM | POA: Insufficient documentation

## 2012-06-13 DIAGNOSIS — Z85038 Personal history of other malignant neoplasm of large intestine: Secondary | ICD-10-CM | POA: Insufficient documentation

## 2012-06-13 DIAGNOSIS — D72829 Elevated white blood cell count, unspecified: Secondary | ICD-10-CM | POA: Insufficient documentation

## 2012-06-13 DIAGNOSIS — R599 Enlarged lymph nodes, unspecified: Secondary | ICD-10-CM | POA: Insufficient documentation

## 2012-06-13 DIAGNOSIS — R591 Generalized enlarged lymph nodes: Secondary | ICD-10-CM

## 2012-06-13 LAB — IMMUNOFIXATION ELECTROPHORESIS
IgA: 75 mg/dL (ref 68–379)
IgM, Serum: 44 mg/dL — ABNORMAL LOW (ref 41–251)

## 2012-06-13 LAB — PROTEIN ELECTROPHORESIS, SERUM
Beta Globulin: 5.9 % (ref 4.7–7.2)
M-Spike, %: NOT DETECTED g/dL
Total Protein ELP: 6.4 g/dL (ref 6.0–8.3)

## 2012-06-13 MED ORDER — FLUDEOXYGLUCOSE F - 18 (FDG) INJECTION
18.2000 | Freq: Once | INTRAVENOUS | Status: AC | PRN
  Administered 2012-06-13: 18.2 via INTRAVENOUS

## 2012-06-14 ENCOUNTER — Ambulatory Visit (HOSPITAL_COMMUNITY): Payer: Medicare Other | Admitting: Oncology

## 2012-06-27 ENCOUNTER — Encounter: Payer: Self-pay | Admitting: *Deleted

## 2012-06-28 ENCOUNTER — Encounter: Payer: Self-pay | Admitting: Cardiovascular Disease

## 2012-06-28 ENCOUNTER — Ambulatory Visit (INDEPENDENT_AMBULATORY_CARE_PROVIDER_SITE_OTHER): Payer: Medicare Other | Admitting: Cardiovascular Disease

## 2012-06-28 VITALS — BP 121/82 | HR 90 | Ht 69.0 in | Wt 323.0 lb

## 2012-06-28 DIAGNOSIS — R609 Edema, unspecified: Secondary | ICD-10-CM

## 2012-06-28 DIAGNOSIS — I4891 Unspecified atrial fibrillation: Secondary | ICD-10-CM | POA: Insufficient documentation

## 2012-06-28 DIAGNOSIS — I10 Essential (primary) hypertension: Secondary | ICD-10-CM

## 2012-06-28 DIAGNOSIS — Z01810 Encounter for preprocedural cardiovascular examination: Secondary | ICD-10-CM | POA: Insufficient documentation

## 2012-06-28 NOTE — Patient Instructions (Addendum)
Your physician recommends that you schedule a follow-up appointment in: 3-4 WEEKS AFTER BIOPSY WITH [facility]  Your physician has requested that you have an echocardiogram. Echocardiography is a painless test that uses sound waves to create images of your heart. It provides your doctor with information about the size and shape of your heart and how well your heart's chambers and valves are working. This procedure takes approximately one hour. There are no restrictions for this procedure.  Your physician has recommended you make the following change in your medication:   1) RE-START TAKING YOUR CARDIZEM 120MG  ONCE DAILY

## 2012-06-28 NOTE — Assessment & Plan Note (Signed)
Resume cardizem for rate control Will need consideration for anticoagulation after biopsy but will depend on tissue diagnosis and need for chemo that could make him cytopenic

## 2012-06-28 NOTE — Assessment & Plan Note (Signed)
Will clear for biopsy so long as EF normal by echo.  Instructed to take cardizem even the morning of biopsy.  No chest pain or history of CAD and I think the biggest issue regarding afib is rate control and establishing an EF before biopsy

## 2012-06-28 NOTE — Assessment & Plan Note (Signed)
Continue lasix and f/u wound care center.  Stable

## 2012-06-28 NOTE — Progress Notes (Signed)
Alex Pizza, MD Alex Pet[place] [address]  Lymphadenopathy  CURRENT THERAPY: Work-up for diagnosis  INTERVAL HISTORY: Alex Petty 67 y.o. male returns for  regular  visit for followup of diffuse adenopathy in the left cervical, left supra-clavicular, right cervical, right supraclavicular, right and left axillary areas as well as a palpably enlarged spleen.  The patient saw a cardiologist on 06/28/2012 for cardiac clearance.  This referral was placed by Dr. Malvin Johns who will perform an excisional lymph node biopsy once cardiac clearance is ascertained.  A 2-D echocardiogram was performed today and that report is pending.  Once we have a diagnosis, we will need to discuss future therapy and intervention.    We spent a lot of time discussing general things such as chemotherapy in general side effects of chemotherapy. He asked me what kind of chemotherapy we were considering and at this point time I will wait until we have diagnosis to discuss specific chemotherapy regimens and specific side effects associated with chemotherapy.  I provided education regarding cancer in elementary terms. He was thankful this education.  They're planning a family vacation the weekend/week of July 19. It will require a nonrefundable down payment. I've recommended that a avoid this vacation at this point time because I suspect he will be on therapy by then and potentially approaching a nadir point during this time.   I personally reviewed and went over laboratory results with the patient.  I reviewed the patient's laboratory work with him.  I personally reviewed and went over radiographic studies with the patient.  I reviewed the patient's PET scan report with him. I printed a copy of the report for him to go home with. I also brought the images up in [facility] and we reviewed the images together.  All questions were answered. The patient is call the clinic with any problems, questions, or concerns.  Our visit  today was consumed with discussion and therefore physical examination was deferred.   Past Medical History  Diagnosis Date  . Arthritis   . Hypertension   . Colon cancer     2005  . Chronic kidney disease   . DJD (degenerative joint disease)   . DJD (degenerative joint disease), ankle and foot   . Arthritis   . Cellulitis   . Arthritis   . Chronic back pain   . Gout     has Cellulitis of leg, left; Acute on chronic kidney disease, stage 3; Morbid obesity; DJD (degenerative joint disease); Lymphedema of lower extremity; A-fib; Edema; and Preop cardiovascular exam on his problem list.     is allergic to Petty inhibitors; rocephin; and beta adrenergic blockers.  Alex Petty does not currently have medications on file.  Past Surgical History  Procedure Laterality Date  . Foot surgery      right foot-  . Colon surgery    . Tonsillectomy    . Cystoscopy with urethral dilatation    . Back surgery      x2  . Cataract extraction w/phaco  01/14/2011    Procedure: CATARACT EXTRACTION PHACO AND INTRAOCULAR LENS PLACEMENT (IOC);  Surgeon: Alex Petty;  Location: [facility];  Service: Ophthalmology;  Laterality: Left;  CDE=16.27    Denies any headaches, dizziness, double vision, fevers, chills, night sweats, nausea, vomiting, diarrhea, constipation, chest pain, heart palpitations, shortness of breath, blood in stool, black tarry stool, urinary pain, urinary burning, urinary frequency, hematuria.   PHYSICAL EXAMINATION  ECOG PERFORMANCE STATUS: 3 - Symptomatic, >50% confined  to bed  Filed Vitals:   06/29/12 1401  BP: 105/71  Pulse: 76  Temp: 97.9 F (36.6 C)  Resp: 18    GENERAL:alert, no distress, well nourished, well developed, comfortable, cooperative and obese SKIN: skin color, texture, turgor are normal, no rashes or significant lesions HEAD: Normocephalic, No masses, lesions, tenderness or abnormalities EYES: normal, Conjunctiva are pink and non-injected EARS: External ears  normal OROPHARYNX:mucous membranes are moist  NECK: Obvious adenopathy appreciated NEURO: alert & oriented x 3 with fluent speech, no focal motor/sensory deficits, in wheelchair   LABORATORY DATA: CBC    Component Value Date/Time   WBC 67.8* 06/09/2012 1044   RBC 3.45* 06/09/2012 1044   HGB 10.5* 06/09/2012 1044   HCT 33.9* 06/09/2012 1044   PLT 104* 06/09/2012 1044   MCV 98.3 06/09/2012 1044   MCH 30.4 06/09/2012 1044   MCHC 31.0 06/09/2012 1044   RDW 19.2* 06/09/2012 1044   LYMPHSABS 67.1* 06/09/2012 1044   MONOABS 0.0* 06/09/2012 1044   EOSABS 0.0 06/09/2012 1044   BASOSABS 0.0 06/09/2012 1044      Chemistry      Component Value Date/Time   NA 143 06/09/2012 1044   K 4.7 06/09/2012 1044   CL 104 06/09/2012 1044   CO2 28 06/09/2012 1044   BUN 41* 06/09/2012 1044   CREATININE 2.35* 06/09/2012 1044      Component Value Date/Time   CALCIUM 9.0 06/09/2012 1044   ALKPHOS 65 06/09/2012 1044   AST 13 06/09/2012 1044   ALT 5 06/09/2012 1044   BILITOT 0.4 06/09/2012 1044     Results for Alex Petty, Alex Petty (MRN [id number]) as of 06/28/2012 11:22  Ref. Range 06/09/2012 10:44  LDH Latest Range: 94-250 U/L 183   Results for Alex Petty, Alex Petty (MRN [id number]) as of 06/28/2012 11:22  Ref. Range 06/09/2012 10:44  Sed Rate Latest Range: 0-16 mm/hr 12   Results for Alex Petty, Alex Petty (MRN [id number]) as of 06/28/2012 11:22  Ref. Range 06/09/2012 10:43  Beta 2 Latest Range: 3.2-6.5 % 3.3     RADIOGRAPHIC STUDIES:  06/13/2012  *RADIOLOGY REPORT*  Clinical Data: Initial treatment strategy for lymphoma. Patient  with diffuse cervical and supraclavicular adenopathy. Massive  obesity. History of gout and therapy.  NUCLEAR MEDICINE PET SKULL BASE TO THIGH  Fasting Blood Glucose: 93  Technique: 18.2 mCi F-18 FDG was injected intravenously. CT data  was obtained and used for attenuation correction and anatomic  localization only. (This was not acquired as a diagnostic CT  examination.) Additional exam  technical data entered on  technologist worksheet.  Comparison: None  Findings:  Neck: There is bulky bilateral cervical adenopathy along the  jugular chains involving level II, level III, level IV, level V  lymph nodes. For example 3.5 cm left level III lymph node (image  53) with SUV max = 8.0  Chest: There is bulky hypermetabolic axillary lymphadenopathy on  the left and right. Exemplary 2.8 cm node on the left with SUV max  = 6.6. There is smaller mediastinal and hilar lymphadenopathy  which are also hypermetabolic. No pericardial effusion. No  pulmonary parenchymal lesions.  Abdomen/Pelvis: The spleen is enlarged with a calculated volume of  1300 ml. The spleen measures 17 cm in craniocaudad dimension and  is mildly hypermetabolic with SUV max = 6.8.  There are hypermetabolic periaortic retroperitoneal lymph nodes.  For example lymph node between the IVC and aorta measures 3.6 cm  (image 180) with SUV max = 9.4.  There is  bulky hypermetabolic bilateral internal and external iliac  adenopathy as well as inguinal adenopathy. Right inguinal lymph  node measures 2.8 cm ( SUV max = 5.9).  Skeleton: No focal hypermetabolic activity to suggest skeletal  metastasis.  IMPRESSION:  1. Hypermetabolic bulky adenopathy involving the cervical lymph  nodes, axillary lymph nodes, mediastinal lymph nodes,  retroperitoneal periaortic lymph nodes and pelvic lymph nodes  consistent with lymphoma.  2. Enlarged hypermetabolic spleen.  3. No evidence of bone metastasis.  Original Report Authenticated By: Genevive Bi, M.D.    PATHOLOGY:  06/12/2012  Interpretation Peripheral Blood Flow Cytometry - MONOCLONAL B-CELL POPULATION IDENTIFIED, SEE COMMENT. Diagnosis Comment: Examination of peripheral blood shows predominance of small and medium sized lymphoid cells with irregular / clefted nuclei, dense chromatin and inconspicuous nucleoli. Based on the morphologic features and the phenotypic  pattern, particularly the CD5 positivity, the differential diagnosis includes chronic lymphocytic leukemia and leukemic phase of mantle cell lymphoma. FISH studies may be helpful in differentiating the two entities. Clinical correlation is recommended. (BNS:caf 06/13/12) Guerry Bruin MD Pathologist, Electronic Signature (Case signed 06/20/2012)     ASSESSMENT:  1. Diffuse adenopathy in the left cervical, left supra-clavicular, right cervical, right supraclavicular, right and left axillary areas as well as a palpably enlarged spleen. The lymph nodes and spleen are hypermetabolic on PET scan on 06/13/2012.  Flow cytometry via Peripheral blood shows a monoclonal B-Cell Population and the differential diagnosis inclued CLL and leukemic phase of mantel cell lymphoma.  Patient Active Problem List   Diagnosis Date Noted  . A-fib 06/28/2012  . Edema 06/28/2012  . Preop cardiovascular exam 06/28/2012  . Cellulitis of leg, left 10/15/2010  . Acute on chronic kidney disease, stage 3 10/15/2010  . Morbid obesity 10/15/2010  . DJD (degenerative joint disease) 10/15/2010  . Lymphedema of lower extremity 10/15/2010     PLAN:  1. I personally reviewed and went over laboratory results with the patient. 2. I personally reviewed and went over radiographic studies with the patient. 3. Patient asked to contact our office Monday if he has not heard from Dr. Malvin Johns.  4. Prior to chemotherapy, he will need a port placed and will require chemotherapy teaching once development of chemotherapy regimen takes place.  5. Patient education regarding cancer provided.  This was very elementary. 6. Return in 2 weeks for follow-up.   THERAPY PLAN:  The patient will undergo an excisional lymph node biopsy by Dr. Malvin Johns and the very near future. As we have a diagnosis, he will need a port placed and chemotherapy teaching. Once we have a definitive diagnosis, we can develop a treatment plan and we will discuss this in  much more detail in the future with the patient.  All questions were answered. The patient knows to call the clinic with any problems, questions or concerns. We can certainly see the patient much sooner if necessary.  Patient and plan will be discussed with Dr. Mariel Sleet within the next 24 hours.    Norissa Bartee

## 2012-06-28 NOTE — Progress Notes (Signed)
Patient ID: Alex Petty, male   DOB: [birth year], 67 y.o.   MRN: [id number] 67 yo referred by Dr Malvin Johns for preop clearence. Noted lymphadenopathy starting last Fall but ignored it. Has multiple areas of bulky cervical adenopathy and needs open biopsy.  ECG noted to be in afib.  Started on cardizem but not compliant. Has chronic obesity, LE edema and LE ulcers that are frequently cared for at wound center. No history of cardiac issues, SSCP , CHF palpitations or syncope. Does not know he is in afib.  Colon resection 2005 and back surgery 1989 and 2006 with no complications   ROS: Denies fever, malais, weight loss, blurry vision, decreased visual acuity, cough, sputum, SOB, hemoptysis, pleuritic pain, palpitaitons, heartburn, abdominal pain, melena, lower extremity edema, claudication, or rash.  All other systems reviewed and negative   General: Affect appropriate Healthy:  appears stated age HEENT: normal Neck supple with no adenopathy JVP normal no bruits no thyromegaly Lungs clear with no wheezing and good diaphragmatic motion Heart:  S1/S2 no murmur,rub, gallop or click PMI normal Abdomen: benighn, BS positve, no tenderness, no AAA no bruit.  No HSM or HJR Distal pulses intact with no bruits No edema Neuro non-focal Skin warm and dry No muscular weakness  Medications Current Outpatient Prescriptions  Medication Sig Dispense Refill  . BOSWELLIA SERRATA PO Take 1 capsule by mouth continuous dialysis. With Vitamin D3      . doxazosin (CARDURA) 4 MG tablet Take 4 mg by mouth every morning.       . febuxostat (ULORIC) 40 MG tablet Take 40 mg by mouth every morning.       . fenofibrate 54 MG tablet Take 54 mg by mouth every morning.       . Glucosamine-Chondroit-Vit C-Mn (GLUCOSAMINE 1500 COMPLEX PO) Take 1 tablet by mouth every morning.       Marland Kitchen HYDROcodone-acetaminophen (NORCO) 7.5-325 MG per tablet Take 1 tablet by mouth every 6 (six) hours as needed for pain.      . indomethacin  (INDOCIN) 50 MG capsule Take 50 mg by mouth every 8 (eight) hours as needed. For gout       . Multiple Vitamins-Minerals (MULTIVITAMINS THER. W/MINERALS) TABS Take 1 tablet by mouth every morning.       . torsemide (DEMADEX) 20 MG tablet Take 20-40 mg by mouth daily. Take 2 tablets if fluid retention is severe       . diltiazem (CARDIZEM CD) 120 MG 24 hr capsule Take 120 mg by mouth every morning.        No current facility-administered medications for this visit.    Allergies Ace inhibitors; Rocephin; and Beta adrenergic blockers  Family History: Family History  Problem Relation Age of Onset  . Anesthesia problems Neg Hx   . Hypotension Neg Hx   . Malignant hyperthermia Neg Hx   . Pseudochol deficiency Neg Hx     Social History: History   Social History  . Marital Status: Married    Spouse Name: N/A    Number of Children: N/A  . Years of Education: N/A   Occupational History  . Not on file.   Social History Main Topics  . Smoking status: Never Smoker   . Smokeless tobacco: Never Used  . Alcohol Use: No  . Drug Use: No  . Sexually Active: Yes    Birth Control/ Protection: None   Other Topics Concern  . Not on file   Social History Narrative  . No  narrative on file    Electrocardiogram:  afib rate 84 LAD low voltage PVC  Assessment and Plan

## 2012-06-29 ENCOUNTER — Encounter (HOSPITAL_BASED_OUTPATIENT_CLINIC_OR_DEPARTMENT_OTHER): Payer: Medicare Other | Admitting: Oncology

## 2012-06-29 ENCOUNTER — Encounter (HOSPITAL_COMMUNITY): Payer: Self-pay | Admitting: Oncology

## 2012-06-29 ENCOUNTER — Ambulatory Visit (HOSPITAL_COMMUNITY)
Admission: RE | Admit: 2012-06-29 | Discharge: 2012-06-29 | Disposition: A | Discharge status: Home / Self Care | Payer: Medicare Other | Source: Ambulatory Visit | Attending: Cardiovascular Disease | Admitting: Cardiovascular Disease

## 2012-06-29 VITALS — BP 105/71 | HR 76 | Temp 97.9°F | Resp 18

## 2012-06-29 DIAGNOSIS — I319 Disease of pericardium, unspecified: Secondary | ICD-10-CM

## 2012-06-29 DIAGNOSIS — R599 Enlarged lymph nodes, unspecified: Secondary | ICD-10-CM

## 2012-06-29 DIAGNOSIS — R591 Generalized enlarged lymph nodes: Secondary | ICD-10-CM

## 2012-06-29 DIAGNOSIS — I4891 Unspecified atrial fibrillation: Secondary | ICD-10-CM | POA: Insufficient documentation

## 2012-06-29 DIAGNOSIS — Z01810 Encounter for preprocedural cardiovascular examination: Secondary | ICD-10-CM | POA: Insufficient documentation

## 2012-06-29 NOTE — Progress Notes (Signed)
*  PRELIMINARY RESULTS* Echocardiogram 2D Echocardiogram has been performed.  Alex Petty 06/29/2012, 2:49 PM

## 2012-06-29 NOTE — Patient Instructions (Signed)
[facility] Discharge Instructions  RECOMMENDATIONS MADE BY THE CONSULTANT AND ANY TEST RESULTS WILL BE SENT TO YOUR REFERRING PHYSICIAN.  EXAM FINDINGS BY THE PHYSICIAN TODAY AND SIGNS OR SYMPTOMS TO REPORT TO CLINIC OR PRIMARY PHYSICIAN: Exam findings as  SPECIAL INSTRUCTIONS/FOLLOW-UP: 1.  Please proceed as your biopsy as discussed with Dr. Malvin Johns, not that Cardiology has cleared you. 2.  Pending your biopsy results, we will proceed with treatment from there. 3.  Return in 2-3 weeks to see T. Kefalas, PA-C or Dr. Mariel Sleet to discuss biopsy findings and plan of care moving forward.  Thank you for choosing [facility] to provide your oncology and hematology care.  To afford each patient quality time with our providers, please arrive at least 15 minutes before your scheduled appointment time.  With your help, our goal is to use those 15 minutes to complete the necessary work-up to ensure our physicians have the information they need to help with your evaluation and healthcare recommendations.    Effective January 1st, 2014, we ask that you re-schedule your appointment with our physicians should you arrive 10 or more minutes late for your appointment.  We strive to give you quality time with our providers, and arriving late affects you and other patients whose appointments are after yours.    Again, thank you for choosing [facility].  Our hope is that these requests will decrease the amount of time that you wait before being seen by our physicians.       _____________________________________________________________  Should you have questions after your visit to [facility], please contact our office at [phone] between the hours of 8:30 a.m. and 5:00 p.m.  Voicemails left after 4:30 p.m. will not be returned until the following business day.  For prescription refill requests, have your pharmacy contact our office with your  prescription refill request.

## 2012-07-03 ENCOUNTER — Other Ambulatory Visit (HOSPITAL_COMMUNITY): Payer: Self-pay | Admitting: Oncology

## 2012-07-03 ENCOUNTER — Telehealth (HOSPITAL_COMMUNITY): Payer: Self-pay

## 2012-07-03 DIAGNOSIS — Z9189 Other specified personal risk factors, not elsewhere classified: Secondary | ICD-10-CM

## 2012-07-03 NOTE — Telephone Encounter (Signed)
Message left for patient to call back regarding status of biopsy.

## 2012-07-04 ENCOUNTER — Telehealth: Payer: Self-pay | Admitting: *Deleted

## 2012-07-04 NOTE — Telephone Encounter (Signed)
Please advise 

## 2012-07-04 NOTE — Telephone Encounter (Signed)
PT WIFE STOPPED IN TO CHECK ON CLEARANCE FOR DR BRADFORD'S OFFICE. PT WAS SEEN BY DR Eden Emms AND SENT FOR ECHO-RESULTS COME BACK AND PT IS SET UP FOR F/U WITH [facility] ON 07/19/12.  DR ZOXWRUEA'V OFFICE ALSO CALLED TO SEE IF CLEARANCE HAD BEEN GIVEN YET.  CAN WE DO CLEARANCE LETTER YET OR ARE WE WAITING ON NEXT F/U BEFORE WE CLEAR PATIENT?

## 2012-07-05 ENCOUNTER — Encounter (HOSPITAL_COMMUNITY): Payer: Self-pay | Admitting: Pharmacy Technician

## 2012-07-06 ENCOUNTER — Encounter: Payer: Self-pay | Admitting: *Deleted

## 2012-07-06 ENCOUNTER — Telehealth: Payer: Self-pay | Admitting: Cardiovascular Disease

## 2012-07-06 NOTE — Telephone Encounter (Signed)
New problem  Pt said he received a call from you and he is returning your call.

## 2012-07-06 NOTE — Telephone Encounter (Signed)
Follow-up: ° ° ° °Patient called in returning your call.  Please call back. °

## 2012-07-06 NOTE — Telephone Encounter (Signed)
EF normal OK for open lymph node biopsy

## 2012-07-06 NOTE — Telephone Encounter (Signed)
SEE OTHER PHONE NOTE./CY 

## 2012-07-06 NOTE — Telephone Encounter (Signed)
MAY PT PROCEED WITH SURGERY BASED ON ECHO RESULTS .Zack Seal

## 2012-07-06 NOTE — Telephone Encounter (Signed)
CLEARANCE NOTE FAXED TO DR Malvin Johns  AT   [phone]  PT AWARE .Alex Petty

## 2012-07-10 ENCOUNTER — Encounter (HOSPITAL_COMMUNITY)
Admission: RE | Admit: 2012-07-10 | Discharge: 2012-07-10 | Disposition: A | Discharge status: Home / Self Care | Payer: Medicare Other | Source: Ambulatory Visit | Attending: General Surgery | Admitting: General Surgery

## 2012-07-10 ENCOUNTER — Encounter (HOSPITAL_COMMUNITY): Payer: Self-pay

## 2012-07-10 HISTORY — DX: Cardiac arrhythmia, unspecified: I49.9

## 2012-07-10 LAB — CBC WITH DIFFERENTIAL/PLATELET
Band Neutrophils: 0 % (ref 0–10)
Basophils Absolute: 0 10*3/uL (ref 0.0–0.1)
Basophils Relative: 0 % (ref 0–1)
Eosinophils Absolute: 0 10*3/uL (ref 0.0–0.7)
Eosinophils Relative: 0 % (ref 0–5)
HCT: 31.4 % — ABNORMAL LOW (ref 39.0–52.0)
Hemoglobin: 9.6 g/dL — ABNORMAL LOW (ref 13.0–17.0)
MCH: 30.5 pg (ref 26.0–34.0)
MCHC: 30.6 g/dL (ref 30.0–36.0)
MCV: 99.7 fL (ref 78.0–100.0)
Myelocytes: 0 %
Neutro Abs: 2.1 10*3/uL (ref 1.7–7.7)
Neutrophils Relative %: 5 % — ABNORMAL LOW (ref 43–77)
Promyelocytes Absolute: 0 %
RBC: 3.15 MIL/uL — ABNORMAL LOW (ref 4.22–5.81)

## 2012-07-10 LAB — BASIC METABOLIC PANEL
CO2: 28 mEq/L (ref 19–32)
Calcium: 9.1 mg/dL (ref 8.4–10.5)
Creatinine, Ser: 2.27 mg/dL — ABNORMAL HIGH (ref 0.50–1.35)
GFR calc non Af Amer: 28 mL/min — ABNORMAL LOW (ref >90)
Glucose, Bld: 102 mg/dL — ABNORMAL HIGH (ref 70–99)
Sodium: 143 mEq/L (ref 135–145)

## 2012-07-10 MED ORDER — CIPROFLOXACIN IN D5W 200 MG/100ML IV SOLN: 200.0000 mg | Freq: Two times a day (BID) | INTRAVENOUS | Status: DC

## 2012-07-10 NOTE — Progress Notes (Signed)
07/10/12 1421  OBSTRUCTIVE SLEEP APNEA  Have you ever been diagnosed with sleep apnea through a sleep study? No  Do you snore loudly (loud enough to be heard through closed doors)?  0  Do you often feel tired, fatigued, or sleepy during the daytime? 1  Has anyone observed you stop breathing during your sleep? 0  Do you have, or are you being treated for high blood pressure? 1  BMI more than 35 kg/m2? 1  Age over 68 years old? 1  Neck circumference greater than 40 cm/18 inches? 1  Gender: 1  Obstructive Sleep Apnea Score 6  Score 4 or greater  Results sent to PCP

## 2012-07-10 NOTE — Patient Instructions (Addendum)
    Alex Petty  07/10/2012   Your procedure is scheduled on:   07/11/2012  Report to [facility] at 730  AM.  Call this number if you have problems the morning of surgery: [phone]   Remember:   Do not eat food or drink liquids after midnight.   Take these medicines the morning of surgery with A SIP OF WATER: diltiazem, doxazosin, uloric, hydrocodone   Do not wear jewelry, make-up or nail polish.  Do not wear lotions, powders, or perfumes.   Do not shave 48 hours prior to surgery. Men may shave face and neck.  Do not bring valuables to the hospital.  [facility] is not responsible for any belongings or valuables.  Contacts, dentures or bridgework may not be worn into surgery.  Leave suitcase in the car. After surgery it may be brought to your room.  For patients admitted to the hospital, checkout time is 11:00 AM the day of discharge.   Patients discharged the day of surgery will not be allowed to drive  home.  Name and phone number of your driver: family  Special Instructions: Shower using CHG 2 nights before surgery and the night before surgery.  If you shower the day of surgery use CHG.  Use special wash - you have one bottle of CHG for all showers.  You should use approximately 1/3 of the bottle for each shower.   Please read over the following fact sheets that you were given: Pain Booklet, Coughing and Deep Breathing, MRSA Information, Surgical Site Infection Prevention, Anesthesia Post-op Instructions and Care and Recovery After Surgery PATIENT INSTRUCTIONS POST-ANESTHESIA  IMMEDIATELY FOLLOWING SURGERY:  Do not drive or operate machinery for the first twenty four hours after surgery.  Do not make any important decisions for twenty four hours after surgery or while taking narcotic pain medications or sedatives.  If you develop intractable nausea and vomiting or a severe headache please notify your doctor immediately.  FOLLOW-UP:  Please make an appointment with your surgeon as  instructed. You do not need to follow up with anesthesia unless specifically instructed to do so.  WOUND CARE INSTRUCTIONS (if applicable):  Keep a dry clean dressing on the anesthesia/puncture wound site if there is drainage.  Once the wound has quit draining you may leave it open to air.  Generally you should leave the bandage intact for twenty four hours unless there is drainage.  If the epidural site drains for more than 36-48 hours please call the anesthesia department.  QUESTIONS?:  Please feel free to call your physician or the hospital operator if you have any questions, and they will be happy to assist you.

## 2012-07-11 ENCOUNTER — Ambulatory Visit (HOSPITAL_COMMUNITY)
Admission: RE | Admit: 2012-07-11 | Discharge: 2012-07-11 | Disposition: A | Discharge status: Home / Self Care | Payer: Medicare Other | Source: Ambulatory Visit | Attending: General Surgery | Admitting: General Surgery

## 2012-07-11 ENCOUNTER — Encounter (HOSPITAL_COMMUNITY): Payer: Self-pay | Admitting: Anesthesiology

## 2012-07-11 ENCOUNTER — Ambulatory Visit (HOSPITAL_COMMUNITY): Payer: Medicare Other | Admitting: Anesthesiology

## 2012-07-11 ENCOUNTER — Encounter (HOSPITAL_COMMUNITY): Payer: Self-pay | Admitting: *Deleted

## 2012-07-11 ENCOUNTER — Ambulatory Visit (HOSPITAL_COMMUNITY): Payer: Medicare Other

## 2012-07-11 ENCOUNTER — Encounter (HOSPITAL_COMMUNITY)
Admission: RE | Disposition: A | Discharge status: Home / Self Care | Payer: Self-pay | Source: Ambulatory Visit | Attending: General Surgery

## 2012-07-11 DIAGNOSIS — E669 Obesity, unspecified: Secondary | ICD-10-CM | POA: Insufficient documentation

## 2012-07-11 DIAGNOSIS — Z881 Allergy status to other antibiotic agents status: Secondary | ICD-10-CM | POA: Insufficient documentation

## 2012-07-11 DIAGNOSIS — I4891 Unspecified atrial fibrillation: Secondary | ICD-10-CM | POA: Insufficient documentation

## 2012-07-11 DIAGNOSIS — K219 Gastro-esophageal reflux disease without esophagitis: Secondary | ICD-10-CM | POA: Insufficient documentation

## 2012-07-11 DIAGNOSIS — N289 Disorder of kidney and ureter, unspecified: Secondary | ICD-10-CM | POA: Insufficient documentation

## 2012-07-11 DIAGNOSIS — Z888 Allergy status to other drugs, medicaments and biological substances status: Secondary | ICD-10-CM | POA: Insufficient documentation

## 2012-07-11 DIAGNOSIS — G4730 Sleep apnea, unspecified: Secondary | ICD-10-CM | POA: Insufficient documentation

## 2012-07-11 DIAGNOSIS — Z6841 Body Mass Index (BMI) 40.0 and over, adult: Secondary | ICD-10-CM | POA: Insufficient documentation

## 2012-07-11 DIAGNOSIS — M109 Gout, unspecified: Secondary | ICD-10-CM | POA: Insufficient documentation

## 2012-07-11 DIAGNOSIS — I10 Essential (primary) hypertension: Secondary | ICD-10-CM | POA: Insufficient documentation

## 2012-07-11 DIAGNOSIS — C9110 Chronic lymphocytic leukemia of B-cell type not having achieved remission: Secondary | ICD-10-CM | POA: Insufficient documentation

## 2012-07-11 DIAGNOSIS — M129 Arthropathy, unspecified: Secondary | ICD-10-CM | POA: Insufficient documentation

## 2012-07-11 DIAGNOSIS — I890 Lymphedema, not elsewhere classified: Secondary | ICD-10-CM | POA: Insufficient documentation

## 2012-07-11 HISTORY — PX: LYMPH NODE BIOPSY: SHX201

## 2012-07-11 HISTORY — PX: PORTACATH PLACEMENT: SHX2246

## 2012-07-11 SURGERY — LYMPH NODE BIOPSY
Anesthesia: General | Site: Neck | Laterality: Right | Wound class: Clean

## 2012-07-11 MED ORDER — FENTANYL CITRATE 0.05 MG/ML IJ SOLN
INTRAMUSCULAR | Status: AC
  Filled 2012-07-11: qty 2

## 2012-07-11 MED ORDER — EPHEDRINE SULFATE 50 MG/ML IJ SOLN
INTRAMUSCULAR | Status: AC
  Filled 2012-07-11: qty 1

## 2012-07-11 MED ORDER — ROCURONIUM BROMIDE 100 MG/10ML IV SOLN
INTRAVENOUS | Status: DC | PRN
  Administered 2012-07-11: 10 mg via INTRAVENOUS

## 2012-07-11 MED ORDER — CIPROFLOXACIN IN D5W 200 MG/100ML IV SOLN
200.0000 mg | Freq: Once | INTRAVENOUS | Status: DC
  Administered 2012-07-11: 200 mg via INTRAVENOUS

## 2012-07-11 MED ORDER — FENTANYL CITRATE 0.05 MG/ML IJ SOLN: 25.0000 ug | INTRAMUSCULAR | Status: DC | PRN

## 2012-07-11 MED ORDER — ONDANSETRON HCL 4 MG/2ML IJ SOLN
INTRAMUSCULAR | Status: AC
  Filled 2012-07-11: qty 2

## 2012-07-11 MED ORDER — PROPOFOL 10 MG/ML IV BOLUS
INTRAVENOUS | Status: DC | PRN
  Administered 2012-07-11: 200 mg via INTRAVENOUS

## 2012-07-11 MED ORDER — ONDANSETRON HCL 4 MG/2ML IJ SOLN: 4.0000 mg | Freq: Once | INTRAMUSCULAR | Status: DC | PRN

## 2012-07-11 MED ORDER — EPHEDRINE SULFATE 50 MG/ML IJ SOLN
INTRAMUSCULAR | Status: DC | PRN
  Administered 2012-07-11 (×2): 10 mg via INTRAVENOUS
  Administered 2012-07-11: 5 mg via INTRAVENOUS

## 2012-07-11 MED ORDER — FENTANYL CITRATE 0.05 MG/ML IJ SOLN
INTRAMUSCULAR | Status: DC | PRN
  Administered 2012-07-11: 50 ug via INTRAVENOUS
  Administered 2012-07-11: 25 ug via INTRAVENOUS
  Administered 2012-07-11: 50 ug via INTRAVENOUS
  Administered 2012-07-11: 25 ug via INTRAVENOUS

## 2012-07-11 MED ORDER — LIDOCAINE HCL (PF) 1 % IJ SOLN
INTRAMUSCULAR | Status: AC
  Filled 2012-07-11: qty 5

## 2012-07-11 MED ORDER — LACTATED RINGERS IV SOLN
INTRAVENOUS | Status: DC
  Administered 2012-07-11 (×2): via INTRAVENOUS

## 2012-07-11 MED ORDER — PHENYLEPHRINE HCL 10 MG/ML IJ SOLN
INTRAMUSCULAR | Status: AC
  Filled 2012-07-11: qty 1

## 2012-07-11 MED ORDER — SUCCINYLCHOLINE CHLORIDE 20 MG/ML IJ SOLN
INTRAMUSCULAR | Status: AC
  Filled 2012-07-11: qty 1

## 2012-07-11 MED ORDER — SODIUM CHLORIDE 0.9 % IR SOLN
Status: DC | PRN
  Administered 2012-07-11: 500 mL

## 2012-07-11 MED ORDER — PHENYLEPHRINE HCL 10 MG/ML IJ SOLN
INTRAMUSCULAR | Status: DC | PRN
  Administered 2012-07-11: 100 ug via INTRAVENOUS
  Administered 2012-07-11: 50 ug via INTRAVENOUS
  Administered 2012-07-11 (×3): 100 ug via INTRAVENOUS

## 2012-07-11 MED ORDER — LIDOCAINE HCL (CARDIAC) 20 MG/ML IV SOLN
INTRAVENOUS | Status: DC | PRN
  Administered 2012-07-11: 30 mg via INTRAVENOUS

## 2012-07-11 MED ORDER — HEPARIN SOD (PORK) LOCK FLUSH 100 UNIT/ML IV SOLN
INTRAVENOUS | Status: DC | PRN
  Administered 2012-07-11: 500 [IU] via INTRAVENOUS

## 2012-07-11 MED ORDER — GLYCOPYRROLATE 0.2 MG/ML IJ SOLN
INTRAMUSCULAR | Status: AC
  Filled 2012-07-11: qty 1

## 2012-07-11 MED ORDER — BACITRACIN-NEOMYCIN-POLYMYXIN 400-5-5000 EX OINT
TOPICAL_OINTMENT | CUTANEOUS | Status: DC | PRN
  Administered 2012-07-11: 1 via TOPICAL

## 2012-07-11 MED ORDER — CIPROFLOXACIN IN D5W 200 MG/100ML IV SOLN
INTRAVENOUS | Status: AC
  Filled 2012-07-11: qty 100

## 2012-07-11 MED ORDER — MIDAZOLAM HCL 2 MG/2ML IJ SOLN
1.0000 mg | INTRAMUSCULAR | Status: DC | PRN
  Administered 2012-07-11: 2 mg via INTRAVENOUS

## 2012-07-11 MED ORDER — PROPOFOL 10 MG/ML IV EMUL
INTRAVENOUS | Status: AC
  Filled 2012-07-11: qty 20

## 2012-07-11 MED ORDER — MIDAZOLAM HCL 2 MG/2ML IJ SOLN
INTRAMUSCULAR | Status: AC
  Filled 2012-07-11: qty 2

## 2012-07-11 MED ORDER — GLYCOPYRROLATE 0.2 MG/ML IJ SOLN
0.2000 mg | Freq: Once | INTRAMUSCULAR | Status: AC
  Administered 2012-07-11: 0.2 mg via INTRAVENOUS

## 2012-07-11 MED ORDER — ONDANSETRON HCL 4 MG/2ML IJ SOLN
4.0000 mg | Freq: Once | INTRAMUSCULAR | Status: AC
  Administered 2012-07-11: 4 mg via INTRAVENOUS

## 2012-07-11 MED ORDER — ROCURONIUM BROMIDE 50 MG/5ML IV SOLN
INTRAVENOUS | Status: AC
  Filled 2012-07-11: qty 1

## 2012-07-11 MED ORDER — 0.9 % SODIUM CHLORIDE (POUR BTL) OPTIME
TOPICAL | Status: DC | PRN
  Administered 2012-07-11: 1000 mL

## 2012-07-11 MED ORDER — SUCCINYLCHOLINE CHLORIDE 20 MG/ML IJ SOLN
INTRAMUSCULAR | Status: DC | PRN
  Administered 2012-07-11: 140 mg via INTRAVENOUS

## 2012-07-11 MED ORDER — HEPARIN SOD (PORK) LOCK FLUSH 100 UNIT/ML IV SOLN
INTRAVENOUS | Status: AC
  Filled 2012-07-11: qty 5

## 2012-07-11 MED ORDER — BUPIVACAINE HCL (PF) 0.5 % IJ SOLN
INTRAMUSCULAR | Status: AC
  Filled 2012-07-11: qty 30

## 2012-07-11 MED ORDER — HEPARIN SODIUM (PORCINE) 1000 UNIT/ML IJ SOLN
INTRAMUSCULAR | Status: AC
  Filled 2012-07-11: qty 1

## 2012-07-11 MED ORDER — STERILE WATER FOR IRRIGATION IR SOLN
Status: DC | PRN
  Administered 2012-07-11: 2000 mL

## 2012-07-11 MED ORDER — BACITRACIN-NEOMYCIN-POLYMYXIN 400-5-5000 EX OINT
TOPICAL_OINTMENT | CUTANEOUS | Status: AC
  Filled 2012-07-11: qty 2

## 2012-07-11 SURGICAL SUPPLY — 63 items
APPLIER CLIP 9.375 SM OPEN (CLIP) ×3
APR CLP SM 9.3 20 MLT OPN (CLIP) ×2
ATTRACTOMAT 16X20 MAGNETIC DRP (DRAPES) ×3 IMPLANT
BAG DECANTER FOR FLEXI CONT (MISCELLANEOUS) ×3 IMPLANT
BAG HAMPER (MISCELLANEOUS) ×3 IMPLANT
CLIP APPLIE 9.375 SM OPEN (CLIP) IMPLANT
CLOTH BEACON ORANGE TIMEOUT ST (SAFETY) ×3 IMPLANT
COVER LIGHT HANDLE STERIS (MISCELLANEOUS) ×6 IMPLANT
DECANTER SPIKE VIAL GLASS SM (MISCELLANEOUS) ×3 IMPLANT
DRAPE C-ARM FOLDED MOBILE STRL (DRAPES) ×3 IMPLANT
DRAPE PROXIMA HALF (DRAPES) ×3 IMPLANT
DRSG TEGADERM 2-3/8X2-3/4 SM (GAUZE/BANDAGES/DRESSINGS) ×4 IMPLANT
DURAPREP 26ML APPLICATOR (WOUND CARE) ×3 IMPLANT
ELECT REM PT RETURN 9FT ADLT (ELECTROSURGICAL) ×3
ELECTRODE REM PT RTRN 9FT ADLT (ELECTROSURGICAL) ×2 IMPLANT
FORMALIN 10 PREFIL 480ML (MISCELLANEOUS) ×2 IMPLANT
GLOVE BIOGEL PI IND STRL 7.0 (GLOVE) IMPLANT
GLOVE BIOGEL PI IND STRL 7.5 (GLOVE) IMPLANT
GLOVE BIOGEL PI INDICATOR 7.0 (GLOVE) ×1
GLOVE BIOGEL PI INDICATOR 7.5 (GLOVE) ×1
GLOVE ECLIPSE 6.5 STRL STRAW (GLOVE) ×3 IMPLANT
GLOVE ECLIPSE 7.0 STRL STRAW (GLOVE) ×1 IMPLANT
GLOVE SKINSENSE NS SZ7.0 (GLOVE) ×1
GLOVE SKINSENSE STRL SZ7.0 (GLOVE) ×2 IMPLANT
GOWN STRL REIN XL XLG (GOWN DISPOSABLE) ×8 IMPLANT
IV NS 500ML (IV SOLUTION) ×3
IV NS 500ML BAXH (IV SOLUTION) ×2 IMPLANT
KIT PORT POWER 8FR ISP MRI (CATHETERS) ×3 IMPLANT
KIT ROOM TURNOVER APOR (KITS) ×3 IMPLANT
MANIFOLD NEPTUNE II (INSTRUMENTS) ×3 IMPLANT
MARKER SKIN DUAL TIP RULER LAB (MISCELLANEOUS) ×3 IMPLANT
NDL HYPO 18GX1.5 BLUNT FILL (NEEDLE) ×2 IMPLANT
NDL HYPO 25X1 1.5 SAFETY (NEEDLE) ×2 IMPLANT
NEEDLE HYPO 18GX1.5 BLUNT FILL (NEEDLE) ×3 IMPLANT
NEEDLE HYPO 25X1 1.5 SAFETY (NEEDLE) ×3 IMPLANT
PACK MINOR (CUSTOM PROCEDURE TRAY) ×4 IMPLANT
PAD ARMBOARD 7.5X6 YLW CONV (MISCELLANEOUS) ×3 IMPLANT
PAD TELFA 3X4 1S STER (GAUZE/BANDAGES/DRESSINGS) ×3 IMPLANT
SET BASIN LINEN APH (SET/KITS/TRAYS/PACK) ×4 IMPLANT
SHEATH COOK PEEL AWAY SET 8F (SHEATH) IMPLANT
SOL PREP PROV IODINE SCRUB 4OZ (MISCELLANEOUS) ×3 IMPLANT
SPONGE GAUZE 2X2 8PLY STRL LF (GAUZE/BANDAGES/DRESSINGS) ×4 IMPLANT
SPONGE GAUZE 4X4 12PLY (GAUZE/BANDAGES/DRESSINGS) ×3 IMPLANT
SPONGE INTESTINAL PEANUT (DISPOSABLE) ×3 IMPLANT
STAPLER VISISTAT 35W (STAPLE) IMPLANT
STRIP CLOSURE SKIN 1/4X3 (GAUZE/BANDAGES/DRESSINGS) ×3 IMPLANT
SUT ETHILON NAB P3 5-0 18IN (SUTURE) ×1 IMPLANT
SUT SILK 2 0 (SUTURE) ×3
SUT SILK 2-0 18XBRD TIE 12 (SUTURE) IMPLANT
SUT SILK 3 0 (SUTURE)
SUT SILK 3-0 18XBRD TIE 12 (SUTURE) IMPLANT
SUT VIC AB 3-0 SH 27 (SUTURE) ×3
SUT VIC AB 3-0 SH 27X BRD (SUTURE) ×4 IMPLANT
SUT VIC AB 4-0 SH 27 (SUTURE) ×6
SUT VIC AB 4-0 SH 27XBRD (SUTURE) ×2 IMPLANT
SUT VIC AB 5-0 P-3 18X BRD (SUTURE) ×2 IMPLANT
SUT VIC AB 5-0 P3 18 (SUTURE) ×6
SYR 20CC LL (SYRINGE) ×3 IMPLANT
SYR 5ML LL (SYRINGE) ×6 IMPLANT
SYR BULB IRRIGATION 50ML (SYRINGE) ×3 IMPLANT
SYR CONTROL 10ML LL (SYRINGE) ×3 IMPLANT
TOWEL OR 17X26 4PK STRL BLUE (TOWEL DISPOSABLE) ×4 IMPLANT
WATER STERILE IRR 1000ML POUR (IV SOLUTION) ×6 IMPLANT

## 2012-07-11 NOTE — Consult Note (Signed)
NAME:  Alex Petty, Alex Petty NO.:  [id number]  MEDICAL RECORD NO.:  [id number]  LOCATION:  DOIB                          FACILITY:  [facility]  PHYSICIAN:  Barbaraann Barthel, M.D. DATE OF BIRTH:  [birth year]  DATE OF CONSULTATION:  07/10/2012 DATE OF DISCHARGE:                                CONSULTATION   REFERRING PHYSICIAN:  [name], MD.  HISTORY OF PRESENT ILLNESS:  This is a 67 year old white male who presented to the Oncology Department with massive lymphadenopathy in the left neck and other adenopathy in the right neck as well.  He was referred for biopsy for the presumptive diagnosis of lymphoma.  He has multiple medical problems, history of gout,  history of severe lymphedema, and a history of hypertension.  He was seen preoperatively by the Cardiology Department and cleared for surgery.  PAST MEDICAL HISTORY:  T and A in childhood and a colon resection in 2005, back surgery in 1989 and then in 2006 he had foot surgery for hammertoe, and right foot surgery in 2011, and then he had bilateral cataract surgery.  ALLERGIES:  He has some intolerance to beta-blockers, ACE inhibitors, and Rocephin which causes a rash.  MEDICATIONS:  For his medication list, consult medication list.  SOCIAL HISTORY:  He is a nondrinker and nonsmoker.  PHYSICAL EXAMINATION:  GENERAL:  He is in no acute distress.  He has an obvious asymmetry of his neck.  He is 5 feet and 9 inches, weighs 320 pounds.  His temperature is 98.5, his pulse is 80 per minute, respirations 14, blood pressure 113/70. HEENT:  Head is normocephalic.  Eyes, extraocular movements are intact. Pupils were round react to light and accommodation.  There is noted bilateral conjunctive pallor.  Nose, oral mucosa are moist.  Pupils are equal and reactive to light and accommodation.  He has bilateral cervical adenopathy.  It is greater on the left side.  There no bruits are appreciated. CHEST:  Fairly clear both  anterior and posterior on auscultation. HEART:  Regular rhythm. ABDOMEN:  Soft.  Bowel sounds are normoactive.  There is no splenomegaly.  No obvious hernias are appreciated. EXTREMITIES:  He has extreme bilateral lymphadenopathy and he has bilateral bursitis with fluid in the both elbows, this is likely related to his gout.  REVIEW OF SYSTEMS:  NEURO SYSTEM:  No history of migraines or seizures. The patient walks with a cane.  ENDOCRINE SYSTEM:  No history of diabetes or thyroid disease or adrenal problems.  CARDIOPULMONARY SYSTEM:  History of hypertension.  He has he has been cleared for surgery by Dr. Eden Emms.  MUSCULOSKELETAL SYSTEM:  Obesity, gout, gouty bursitis of both elbows, severe lymphedema in previous foot surgeries bilaterally.  The patient uses corrective shoes and walks with a cane. GI SYSTEM:  History of GERD.  No history of hepatitis.  No recent history of constipation or diarrhea or bright red rectal bleeding or black, tarry stools.  No history of inflammatory bowel disease or irritable bowel syndrome.  No recent history of weight loss.  He had a colonoscopy done in 2006.  GU SYSTEM:  No history of frequency, dysuria, or kidney stones.  ASSESSMENT AND PLAN:  Mr. Meiner  has been referred for his massive lymphadenopathy, which is likely related to lymphoma.  We will biopsy this and send this as a fresh specimen, so that the Pathology can make the appropriate studies and diagnostic tests.  We will also try to place a Port-A-Cath at the same surgery.  We discussed complications not limited to, but including bleeding, infection, and also with the added complications of pneumothorax and thrombosis and catheter embolization was also discussed.  Informed consent was obtained.  We will pre proceed with surgery as soon as possible.  We will repeat blood work, etc. and make sure that he is cleared for surgery.     Barbaraann Barthel, M.D.     WB/MEDQ  D:  07/10/2012  T:   07/11/2012  Job:  [id number]  cc:   Noralyn Pick. Eden Emms, MD, [facility]  [name], M.D. Fax: [phone]

## 2012-07-11 NOTE — Progress Notes (Signed)
67  yr old W. Male for biopsy of cervical lymph node and placement of porta cath for lymphoma.  Procedure and risks explained and informed consent obtained.  Pt. Has been cleared for surgery by cardiology service pre Op.  No clinical changes since H&P.  Filed Vitals:   07/11/12 0955  BP: 106/71  Pulse:   Temp:   Resp: 20  pulse 89/min, temp 97.8, O2 sat 100 on 15% non re breather mask

## 2012-07-11 NOTE — Anesthesia Procedure Notes (Addendum)
Procedure Name: Intubation Date/Time: 07/11/2012 10:06 AM Performed by: Glynn Octave E Pre-anesthesia Checklist: Patient identified, Patient being monitored, Timeout performed, Emergency Drugs available and Suction available Patient Re-evaluated:Patient Re-evaluated prior to inductionOxygen Delivery Method: Circle System Utilized Preoxygenation: Pre-oxygenation with 100% oxygen Intubation Type: IV induction Ventilation: Mask ventilation without difficulty and Oral airway inserted - appropriate to patient size Laryngoscope Size: Mac and 3 Grade View: Grade I Tube type: Oral Tube size: 8.0 mm Number of attempts: 1 Airway Equipment and Method: stylet Placement Confirmation: ETT inserted through vocal cords under direct vision,  positive ETCO2 and breath sounds checked- equal and bilateral Secured at: 24 cm Tube secured with: Tape Dental Injury: Teeth and Oropharynx as per pre-operative assessment

## 2012-07-11 NOTE — OR Nursing (Deleted)
Surgery cancelled due to abnormal blood pressures, HTN.  By dr. Jayme Cloud and Dr. Emelda Fear.  Pt. Rescheduled for 07-18-2012  0900, to arrive to preop at 0730. Hibiclens wash given to patient. Reinforced that she will need to redo the Mag Citrate prep as prescribed by Dr. Emelda Fear. Instructed on how to use wash. To take meds listed on preop instructions on morning of surgery with a sip of water. Verbalized understanding . IV discontinued , pressure dressing Applied . Escorted at time of discharge.

## 2012-07-11 NOTE — Progress Notes (Signed)
Post OP Check  Pt. awake  And extubated and breathing well.  Dressings dry and in tact.  CXR shows catheter tip in SVC without pneumothorax.  Discharge and follow up will be arranged.  Filed Vitals:   07/11/12 1209  BP: 111/76  Pulse: 69  Temp: 97.8 F (36.6 C)  Resp: 18  O2 sat 98% on 8 L/ min non re breather.  Pt will be discharged when stable from anesthesia point of view.Marland Kitchen

## 2012-07-11 NOTE — Anesthesia Postprocedure Evaluation (Signed)
  Anesthesia Post-op Note  Patient: Alex Petty  Procedure(s) Performed: Procedure(s) with comments: CERVICAL LYMPH NODE BIOPSY (Right) - end @ 1101 INSERTION PORT-A-CATH (Right)  Patient Location: PACU  Anesthesia Type:General  Level of Consciousness: awake and oriented  Airway and Oxygen Therapy: Patient Spontanous Breathing and Patient connected to face mask oxygen  Post-op Pain: none  Post-op Assessment: Post-op Vital signs reviewed, Patient's Cardiovascular Status Stable, Respiratory Function Stable, Patent Airway and No signs of Nausea or vomiting  Post-op Vital Signs: Reviewed and stable  Complications: No apparent anesthesia complications

## 2012-07-11 NOTE — OR Nursing (Signed)
sats keep dropping from 2 versed  o2 placed at 2 liters per min. Continues to drop, placed pt. On nonrebreather mask at 15 %  Still dropping sat  And goes back up to 100%.  Dr. Jayme Cloud  Notified.

## 2012-07-11 NOTE — Brief Op Note (Signed)
07/11/2012  11:58 AM  PATIENT:  Alex Petty  67 y.o. male  PRE-OPERATIVE DIAGNOSIS:  lymphadenopathy  POST-OPERATIVE DIAGNOSIS:  lymphadenopathy  PROCEDURE:  Procedure(s) with comments: CERVICAL LYMPH NODE BIOPSY (Right) - end @ 1101 INSERTION PORT-A-CATH (Right)  SURGEON:  Surgeon(s) and Role:    * Marlane Hatcher, MD - Primary  PHYSICIAN ASSISTANT:   ASSISTANTS: none   ANESTHESIA:   general  EBL:  Total I/O In: 1000 [I.V.:1000] Out: 20 [Blood:20]  BLOOD ADMINISTERED:none  DRAINS: none   LOCAL MEDICATIONS USED:  NONE  SPECIMEN:  Source of Specimen:  Right neck cervical nodes X 2.  DISPOSITION OF SPECIMEN:  PATHOLOGY  COUNTS:  YES  TOURNIQUET:  * No tourniquets in log *  DICTATION: .Other Dictation: Dictation Number OR dict. #  [id number].  PLAN OF CARE: Discharge to home after PACU  PATIENT DISPOSITION:  PACU - hemodynamically stable.   Delay start of Pharmacological VTE agent (>24hrs) due to surgical blood loss or risk of bleeding: not applicable

## 2012-07-11 NOTE — Anesthesia Preprocedure Evaluation (Addendum)
Anesthesia Evaluation  Patient identified by MRN, date of birth, ID band Patient awake    Reviewed: Allergy & Precautions, H&P , NPO status , Patient's Chart, lab work & pertinent test results  Airway Mallampati: III     Comment: Large neck Dental  (+) Teeth Intact   Pulmonary sleep apnea (undiagnosed) ,  breath sounds clear to auscultation        Cardiovascular hypertension, + dysrhythmias Atrial Fibrillation Rhythm:Regular Rate:Normal     Neuro/Psych    GI/Hepatic GERD-  ,  Endo/Other  Morbid obesity  Renal/GU Renal InsufficiencyRenal disease     Musculoskeletal  (+) Arthritis - (Gout   chronic LBP),   Abdominal   Peds  Hematology   Anesthesia Other Findings   Reproductive/Obstetrics                         Anesthesia Physical Anesthesia Plan  ASA: III  Anesthesia Plan: General   Post-op Pain Management:    Induction: Intravenous, Rapid sequence and Cricoid pressure planned  Airway Management Planned: Oral ETT  Additional Equipment:   Intra-op Plan:   Post-operative Plan: Extubation in OR  Informed Consent: I have reviewed the patients History and Physical, chart, labs and discussed the procedure including the risks, benefits and alternatives for the proposed anesthesia with the patient or authorized representative who has indicated his/her understanding and acceptance.     Plan Discussed with:   Anesthesia Plan Comments:       Anesthesia Quick Evaluation

## 2012-07-11 NOTE — Transfer of Care (Signed)
Immediate Anesthesia Transfer of Care Note  Patient: Alex Petty  Procedure(s) Performed: Procedure(s) with comments: CERVICAL LYMPH NODE BIOPSY (Right) - end @ 1101 INSERTION PORT-A-CATH (Right)  Patient Location: PACU  Anesthesia Type:General  Level of Consciousness: awake, alert  and oriented  Airway & Oxygen Therapy: Patient Spontanous Breathing  Post-op Assessment: Report given to PACU RN  Post vital signs: Reviewed and stable  Complications: No apparent anesthesia complications

## 2012-07-12 NOTE — Op Note (Signed)
NAME:  Alex Petty, Alex Petty NO.:  [id number]  MEDICAL RECORD NO.:  [id number]  LOCATION:  APPO                          FACILITY:  [facility]  PHYSICIAN:  Barbaraann Barthel, M.D. DATE OF BIRTH:  1945-09-16  DATE OF PROCEDURE:  07/11/2012 DATE OF DISCHARGE:                              OPERATIVE REPORT   SURGEON:  Barbaraann Barthel, MD.  PREOPERATIVE DIAGNOSIS:  Massive cervical adenopathy (presumptive diagnosis of lymphoma).  POSTOPERATIVE DIAGNOSIS:  Massive cervical adenopathy (presumptive diagnosis of lymphoma).  PROCEDURE: 1. Right cervical lymph node biopsy x2. 2. Placement of right subclavian Port-A-Cath under fluoroscopy.  NOTE:  This is a 67 year old white male with multiple medical problems, who was noted to have massive lymphadenopathy in his left neck.  He was cleared for surgery by the Cardiology Department and referred from the Oncology Department for lymph node biopsy.  These lymph nodes will be sent in saline soaked gauze only, as a fresh specimen.  GROSS OPERATIVE FINDINGS: 1. The patient had large hard enlarged lymph nodes in the right neck. 2. The Port-A-Cath insertion under fluoro was uneventful.  WOUND CLASSIFICATION:  Clean.  TECHNIQUE:  The patient was placed in the supine position.  After adequate administration of general anesthesia by endotracheal intubation, his right neck and upper torso were prepped with Betadine solution and draped in usual manner.  A transverse incision was carried out over some palpated lymph nodes that had been previously marked with a sterile marking pen.  Skin, subcutaneous tissue, cervical fascia, and platysma were incised.  The large lymph nodes were approximately the size of a quarter were dissected free and their blood supply was clipped with hemoclips.  We then irrigated with normal saline solution and closed the incision in this area with 3-0 Polysorb for the subcutaneous space and the skin was closed with  5-0 nylon vertical sutures in the neck and sterile dressing with OpSite and Tegaderm was applied.  We then re-prepped and draped and I chose to stay on the right side the subclavian vein placement for the Port-A-Cath as this patient had very massive adenopathy on the left side.  An 18-gauge needle then was placed in the subclavian vein and then with a guidewire and then the venous dilator was used and then we were able to place the elastic catheter of the silastic catheter over the guidewire and under fluoroscopy this was advanced in appropriate position to what appeared to be the second intercostal space in the superior vena cava.  After checking for hemostasis, the wound was then irrigated and we then attached the infusion device which we first aspirated and then flushed with heparinized saline.  We then placed this in the subcutaneous pocket and then closed the subcutaneous space with 4-0 Polysorb and the skin with a subcuticular suture of 5-0 Polysorb with Steri-Strips, Neosporin, 2x2, and Tegaderm as a dressing.  No drains were placed.  No complications. Total blood loss was maybe 25 mL.  No drains were placed and there were no complications.     Barbaraann Barthel, M.D.     WB/MEDQ  D:  07/11/2012  T:  07/11/2012  Job:  [id number]  cc:   [name], M.D. Fax: [phone]  Gaston Islam. Tressie Stalker, MD Fax: [phone]

## 2012-07-13 ENCOUNTER — Encounter (HOSPITAL_COMMUNITY): Payer: Self-pay | Admitting: General Surgery

## 2012-07-14 NOTE — Addendum Note (Signed)
Addendum created 07/14/12 8295 by Moshe Salisbury, CRNA   Modules edited: Anesthesia Medication Administration

## 2012-07-19 ENCOUNTER — Ambulatory Visit (INDEPENDENT_AMBULATORY_CARE_PROVIDER_SITE_OTHER): Payer: Medicare Other | Admitting: Adult Health

## 2012-07-19 ENCOUNTER — Other Ambulatory Visit (HOSPITAL_COMMUNITY): Payer: Self-pay | Admitting: Oncology

## 2012-07-19 ENCOUNTER — Encounter (HOSPITAL_COMMUNITY): Payer: Self-pay | Admitting: Oncology

## 2012-07-19 ENCOUNTER — Encounter: Payer: Self-pay | Admitting: Adult Health

## 2012-07-19 ENCOUNTER — Encounter (HOSPITAL_COMMUNITY): Payer: Medicare Other | Attending: Oncology | Admitting: Oncology

## 2012-07-19 VITALS — BP 128/76 | HR 80 | Ht 69.0 in | Wt 326.0 lb

## 2012-07-19 DIAGNOSIS — C9110 Chronic lymphocytic leukemia of B-cell type not having achieved remission: Secondary | ICD-10-CM | POA: Insufficient documentation

## 2012-07-19 DIAGNOSIS — N189 Chronic kidney disease, unspecified: Secondary | ICD-10-CM | POA: Insufficient documentation

## 2012-07-19 DIAGNOSIS — L02419 Cutaneous abscess of limb, unspecified: Secondary | ICD-10-CM | POA: Insufficient documentation

## 2012-07-19 DIAGNOSIS — Z85038 Personal history of other malignant neoplasm of large intestine: Secondary | ICD-10-CM | POA: Insufficient documentation

## 2012-07-19 DIAGNOSIS — M549 Dorsalgia, unspecified: Secondary | ICD-10-CM | POA: Insufficient documentation

## 2012-07-19 DIAGNOSIS — R21 Rash and other nonspecific skin eruption: Secondary | ICD-10-CM | POA: Insufficient documentation

## 2012-07-19 DIAGNOSIS — D72829 Elevated white blood cell count, unspecified: Secondary | ICD-10-CM | POA: Insufficient documentation

## 2012-07-19 DIAGNOSIS — M109 Gout, unspecified: Secondary | ICD-10-CM | POA: Insufficient documentation

## 2012-07-19 DIAGNOSIS — I129 Hypertensive chronic kidney disease with stage 1 through stage 4 chronic kidney disease, or unspecified chronic kidney disease: Secondary | ICD-10-CM | POA: Insufficient documentation

## 2012-07-19 DIAGNOSIS — N179 Acute kidney failure, unspecified: Secondary | ICD-10-CM

## 2012-07-19 DIAGNOSIS — R110 Nausea: Secondary | ICD-10-CM | POA: Insufficient documentation

## 2012-07-19 DIAGNOSIS — I4891 Unspecified atrial fibrillation: Secondary | ICD-10-CM | POA: Insufficient documentation

## 2012-07-19 DIAGNOSIS — R161 Splenomegaly, not elsewhere classified: Secondary | ICD-10-CM | POA: Insufficient documentation

## 2012-07-19 HISTORY — DX: Chronic lymphocytic leukemia of B-cell type not having achieved remission: C91.10

## 2012-07-19 LAB — CBC WITH DIFFERENTIAL/PLATELET
Band Neutrophils: 0 % (ref 0–10)
Basophils Absolute: 0 10*3/uL (ref 0.0–0.1)
Basophils Relative: 0 % (ref 0–1)
Blasts: 0 %
HCT: 29 % — ABNORMAL LOW (ref 39.0–52.0)
Hemoglobin: 9 g/dL — ABNORMAL LOW (ref 13.0–17.0)
Lymphs Abs: 30.1 10*3/uL — ABNORMAL HIGH (ref 0.7–4.0)
MCH: 30.9 pg (ref 26.0–34.0)
MCHC: 31 g/dL (ref 30.0–36.0)
MCV: 99.7 fL (ref 78.0–100.0)
Metamyelocytes Relative: 0 %
Myelocytes: 0 %
Promyelocytes Absolute: 0 %
RDW: 18.6 % — ABNORMAL HIGH (ref 11.5–15.5)

## 2012-07-19 LAB — COMPREHENSIVE METABOLIC PANEL
ALT: 5 U/L (ref 0–53)
Albumin: 3.8 g/dL (ref 3.5–5.2)
Alkaline Phosphatase: 73 U/L (ref 39–117)
Calcium: 9.1 mg/dL (ref 8.4–10.5)
GFR calc Af Amer: 31 mL/min — ABNORMAL LOW (ref >90)
Glucose, Bld: 84 mg/dL (ref 70–99)
Potassium: 4.5 mEq/L (ref 3.5–5.1)
Sodium: 140 mEq/L (ref 135–145)
Total Protein: 6.2 g/dL (ref 6.0–8.3)

## 2012-07-19 MED ORDER — ACYCLOVIR 400 MG PO TABS: 400.0000 mg | ORAL_TABLET | Freq: Two times a day (BID) | ORAL | Status: DC

## 2012-07-19 MED ORDER — DEXAMETHASONE 4 MG PO TABS: ORAL_TABLET | ORAL | Status: DC

## 2012-07-19 MED ORDER — LIDOCAINE-PRILOCAINE 2.5-2.5 % EX CREA: TOPICAL_CREAM | CUTANEOUS | Status: DC

## 2012-07-19 MED ORDER — ALLOPURINOL 300 MG PO TABS: 300.0000 mg | ORAL_TABLET | Freq: Every day | ORAL | Status: DC

## 2012-07-19 MED ORDER — WARFARIN SODIUM 5 MG PO TABS: 5.0000 mg | ORAL_TABLET | Freq: Every day | ORAL | Status: DC

## 2012-07-19 MED ORDER — ONDANSETRON HCL 8 MG PO TABS
ORAL_TABLET | ORAL | Status: DC
Start: 2012-07-19 — End: 2012-08-17

## 2012-07-19 MED ORDER — SULFAMETHOXAZOLE-TRIMETHOPRIM 800-160 MG PO TABS: 1.0000 | ORAL_TABLET | ORAL | Status: DC

## 2012-07-19 NOTE — Progress Notes (Signed)
Alex Petty presented for labwork. Labs per MD order drawn via Peripheral Line 25 gauge needle inserted in lt ac.  Good blood return present. Procedure without incident.  Needle removed intact. Patient tolerated procedure well.

## 2012-07-19 NOTE — Progress Notes (Signed)
HPI: Alex Petty is a 67 year old patient of Dr. Charlton Haws we are following for ongoing assessment and management of atrial fibrillation, hypertension, who was last seen in the office on 06/28/2012 for preoperative evaluation for open biopsy of a cervical adenopathy. He has a history of chronic obesity and medical noncompliance. I last visit Cardizem was resumed for rate control, anticoagulation was not restarted until after biopsy was completed.    Since last visit he has had the biopsy with no results discussed with the patient at this time. Appt today. He has had a port-a-cath placed.  Will repeat EKG on this visit He is without complaints at this time. He has chronic LEE. Last creatinine on 07/11/2012 2.27.   Allergies  Allergen Reactions  . Ace Inhibitors Other (See Comments)    cough  . Rocephin (Ceftriaxone Sodium) Hives  . Beta Adrenergic Blockers Rash    Current Outpatient Prescriptions  Medication Sig Dispense Refill  . BOSWELLIA SERRATA PO Take 1 capsule by mouth daily. With Vitamin D3      . diltiazem (CARDIZEM CD) 120 MG 24 hr capsule Take 120 mg by mouth every morning.       Marland Kitchen doxazosin (CARDURA) 4 MG tablet Take 4 mg by mouth every morning.       . febuxostat (ULORIC) 40 MG tablet Take 40 mg by mouth every morning.       . fenofibrate 54 MG tablet Take 54 mg by mouth every morning.       . Glucosamine-Chondroit-Vit C-Mn (GLUCOSAMINE 1500 COMPLEX PO) Take 1 tablet by mouth every morning.       Marland Kitchen HYDROcodone-acetaminophen (NORCO) 7.5-325 MG per tablet Take 1 tablet by mouth every 6 (six) hours as needed for pain.      . indomethacin (INDOCIN) 50 MG capsule Take 50 mg by mouth every 8 (eight) hours as needed. For gout      . Multiple Vitamins-Minerals (MULTIVITAMINS THER. W/MINERALS) TABS Take 1 tablet by mouth every morning.       . torsemide (DEMADEX) 20 MG tablet Take 20-40 mg by mouth daily. Take 2 tablets if fluid retention is severe       . vitamin C (ASCORBIC ACID) 500  MG tablet Take 500 mg by mouth daily.       No current facility-administered medications for this visit.    Past Medical History  Diagnosis Date  . Arthritis   . Hypertension   . Colon cancer     2005  . Chronic kidney disease   . DJD (degenerative joint disease)   . DJD (degenerative joint disease), ankle and foot   . Arthritis   . Cellulitis   . Arthritis   . Chronic back pain   . Gout   . Dysrhythmia     afibrillation    Past Surgical History  Procedure Laterality Date  . Foot surgery      right foot-  . Colon surgery    . Tonsillectomy    . Cystoscopy with urethral dilatation    . Back surgery      x2  . Cataract extraction w/phaco  01/14/2011    Procedure: CATARACT EXTRACTION PHACO AND INTRAOCULAR LENS PLACEMENT (IOC);  Surgeon: Gemma Payor;  Location: [facility];  Service: Ophthalmology;  Laterality: Left;  CDE=16.27  . Lymph node biopsy Right 07/11/2012    Procedure: CERVICAL LYMPH NODE BIOPSY;  Surgeon: Marlane Hatcher, MD;  Location: [facility];  Service: General;  Laterality: Right;  end @  1101  . Portacath placement Right 07/11/2012    Procedure: INSERTION PORT-A-CATH;  Surgeon: Marlane Hatcher, MD;  Location: [facility];  Service: General;  Laterality: Right;    AVW:UJWJXB of systems complete and found to be negative unless listed above  PHYSICAL EXAM BP 128/76  Pulse 80  Ht 5\' 9"  (1.753 m)  Wt 326 lb (147.873 kg)  BMI 48.12 kg/m2  . General: Well developed, well nourished, morbidly obese, in no acute distress Head: Eyes PERRLA, No xanthomas.   Normal cephalic and atramatic  Lungs: Clear bilaterally to auscultation . Heart: HRRR S1 S2, with occasional extra systole. .  Pulses are 2+ & equal.            No carotid bruit. No JVD.  No abdominal bruits. Abdomen: Bowel sounds are positive, abdomen soft, obese, and non-tender without masses or                  Hernia's noted. Msk:  Back normal, normal lumbering  gait. Normal strength and tone for age. Extremities:  No clubbing, cyanosis, he has 2+ edema with woody skin thickening, dressing to the left leg due to mild skin irritation.  DP diminished due to edema. Neuro: Alert and oriented X 3. Psych:  Good affect, responds appropriately  JYN:WGNFAO fibrillation rate of 85 bpm.   ASSESSMENT AND PLAN

## 2012-07-19 NOTE — Progress Notes (Deleted)
Name: Alex Petty    DOB: [birth year]  Age: 67 y.o.  MR#: [id number]       PCP:  [name], MD      Insurance: Payor: MEDICARE / Plan: MEDICARE PART A AND B / Product Type: *No Product type* /   CC:    Chief Complaint  Patient presents with  . Hypertension  . Atrial Fibrillation    VS Filed Vitals:   07/19/12 1330  BP: 128/76  Pulse: 80  Height: 5\' 9"  (1.753 m)  Weight: 326 lb (147.873 kg)    Weights Current Weight  07/19/12 326 lb (147.873 kg)  07/11/12 322 lb (146.058 kg)  07/11/12 322 lb (146.058 kg)    Blood Pressure  BP Readings from Last 3 Encounters:  07/19/12 128/76  07/11/12 101/67  07/11/12 101/67     Admit date:  (Not on file) Last encounter with RMR:  Visit date not found   Allergy Ace inhibitors; Rocephin; and Beta adrenergic blockers  Current Outpatient Prescriptions  Medication Sig Dispense Refill  . BOSWELLIA SERRATA PO Take 1 capsule by mouth daily. With Vitamin D3      . diltiazem (CARDIZEM CD) 120 MG 24 hr capsule Take 120 mg by mouth every morning.       Marland Kitchen doxazosin (CARDURA) 4 MG tablet Take 4 mg by mouth every morning.       . febuxostat (ULORIC) 40 MG tablet Take 40 mg by mouth every morning.       . fenofibrate 54 MG tablet Take 54 mg by mouth every morning.       . Glucosamine-Chondroit-Vit C-Mn (GLUCOSAMINE 1500 COMPLEX PO) Take 1 tablet by mouth every morning.       Marland Kitchen HYDROcodone-acetaminophen (NORCO) 7.5-325 MG per tablet Take 1 tablet by mouth every 6 (six) hours as needed for pain.      . indomethacin (INDOCIN) 50 MG capsule Take 50 mg by mouth every 8 (eight) hours as needed. For gout      . Multiple Vitamins-Minerals (MULTIVITAMINS THER. W/MINERALS) TABS Take 1 tablet by mouth every morning.       . torsemide (DEMADEX) 20 MG tablet Take 20-40 mg by mouth daily. Take 2 tablets if fluid retention is severe       . vitamin C (ASCORBIC ACID) 500 MG tablet Take 500 mg by mouth daily.       No current facility-administered medications  for this visit.    Discontinued Meds:   There are no discontinued medications.  Patient Active Problem List   Diagnosis Date Noted  . A-fib 06/28/2012  . Edema 06/28/2012  . Preop cardiovascular exam 06/28/2012  . Cellulitis of leg, left 10/15/2010  . Acute on chronic kidney disease, stage 3 10/15/2010  . Morbid obesity 10/15/2010  . DJD (degenerative joint disease) 10/15/2010  . Lymphedema of lower extremity 10/15/2010    LABS    Component Value Date/Time   NA 143 07/10/2012 1418   NA 143 06/09/2012 1044   NA 141 01/06/2011 0900   K 4.6 07/10/2012 1418   K 4.7 06/09/2012 1044   K 4.5 01/06/2011 0900   CL 102 07/10/2012 1418   CL 104 06/09/2012 1044   CL 102 01/06/2011 0900   CO2 28 07/10/2012 1418   CO2 28 06/09/2012 1044   CO2 32 01/06/2011 0900   GLUCOSE 102* 07/10/2012 1418   GLUCOSE 97 06/09/2012 1044   GLUCOSE 105* 01/06/2011 0900   BUN 33* 07/10/2012 1418   BUN  41* 06/09/2012 1044   BUN 25* 01/06/2011 0900   CREATININE 2.27* 07/10/2012 1418   CREATININE 2.35* 06/09/2012 1044   CREATININE 2.06* 01/06/2011 0900   CALCIUM 9.1 07/10/2012 1418   CALCIUM 9.0 06/09/2012 1044   CALCIUM 9.6 01/06/2011 0900   GFRNONAA 28* 07/10/2012 1418   GFRNONAA 27* 06/09/2012 1044   GFRNONAA 32* 01/06/2011 0900   GFRAA 33* 07/10/2012 1418   GFRAA 31* 06/09/2012 1044   GFRAA 37* 01/06/2011 0900   CMP     Component Value Date/Time   NA 143 07/10/2012 1418   K 4.6 07/10/2012 1418   CL 102 07/10/2012 1418   CO2 28 07/10/2012 1418   GLUCOSE 102* 07/10/2012 1418   BUN 33* 07/10/2012 1418   CREATININE 2.27* 07/10/2012 1418   CALCIUM 9.1 07/10/2012 1418   PROT 6.6 06/09/2012 1044   ALBUMIN 4.0 06/09/2012 1044   AST 13 06/09/2012 1044   ALT 5 06/09/2012 1044   ALKPHOS 65 06/09/2012 1044   BILITOT 0.4 06/09/2012 1044   GFRNONAA 28* 07/10/2012 1418   GFRAA 33* 07/10/2012 1418       Component Value Date/Time   WBC 41.5* 07/10/2012 1418   WBC 67.8* 06/09/2012 1044   WBC 6.8 01/06/2011 0900   HGB 9.6*  07/10/2012 1418   HGB 10.5* 06/09/2012 1044   HGB 12.4* 01/06/2011 0900   HCT 31.4* 07/10/2012 1418   HCT 33.9* 06/09/2012 1044   HCT 39.8 01/06/2011 0900   MCV 99.7 07/10/2012 1418   MCV 98.3 06/09/2012 1044   MCV 88.2 01/06/2011 0900    Lipid Panel  No results found for this basename: chol, trig, hdl, cholhdl, vldl, ldlcalc    ABG No results found for this basename: phart, pco2, pco2art, po2, po2art, hco3, tco2, acidbasedef, o2sat     Lab Results  Component Value Date   TSH 2.311 10/15/2010   BNP (last 3 results) No results found for this basename: PROBNP,  in the last 8760 hours Cardiac Panel (last 3 results) No results found for this basename: CKTOTAL, CKMB, TROPONINI, RELINDX,  in the last 72 hours  Iron/TIBC/Ferritin No results found for this basename: iron, tibc, ferritin     EKG Orders placed in visit on 06/28/12  . EKG 12-LEAD     Prior Assessment and Plan Problem List as of 07/19/2012     Cardiovascular and Mediastinum   A-fib   Last Assessment & Plan   06/28/2012 Office Visit Written 06/28/2012  3:00 PM by Wendall Stade, MD     Resume cardizem for rate control Will need consideration for anticoagulation after biopsy but will depend on tissue diagnosis and need for chemo that could make him cytopenic      Musculoskeletal and Integument   DJD (degenerative joint disease)     Genitourinary   Acute on chronic kidney disease, stage 3     Other   Morbid obesity   Lymphedema of lower extremity   Cellulitis of leg, left   Edema   Last Assessment & Plan   06/28/2012 Office Visit Written 06/28/2012  3:01 PM by Wendall Stade, MD     Continue lasix and f/u wound care center.  Stable    Preop cardiovascular exam   Last Assessment & Plan   06/28/2012 Office Visit Written 06/28/2012  3:02 PM by Wendall Stade, MD     Will clear for biopsy so long as EF normal by echo.  Instructed to take cardizem even the morning of biopsy.  No chest pain or history of CAD and I think  the biggest issue regarding afib is rate control and establishing an EF before biopsy        Imaging: Dg Chest Portable 1 View  07/11/2012   *RADIOLOGY REPORT*  Clinical Data: Port-A-Cath placement, lymphoma  PORTABLE CHEST - 1 VIEW  Comparison: Portable exam 1205 hours without priors for comparison  Findings: Right subclavian Port-A-Cath with tip projecting over SVC. Marked enlargement of cardiac silhouette. Tortuous aorta. Pulmonary vascularity normal. Atelectasis mid-to-lower left lung. No gross pleural effusion or pneumothorax. Right glenohumeral degenerative changes.  IMPRESSION: No pneumothorax following right subclavian Port-A-Cath insertion. Enlargement of cardiac silhouette. Atelectasis at mid-to-lower left lung.  Findings discussed with Dr. Malvin Johns at 1220 hours on 07/11/2012.   Original Report Authenticated By: Ulyses Southward, M.D.   Dg C-arm 1-60 Min-no Report  07/11/2012   CLINICAL DATA: port-a-cath insertion   C-ARM 1-60 MINUTES  Fluoroscopy was utilized by the requesting physician.  No radiographic  interpretation.

## 2012-07-19 NOTE — Progress Notes (Signed)
[name], MD [name] [address]  CLL (chronic lymphocytic leukemia) - Plan: dexamethasone (DECADRON) 4 MG tablet, ondansetron (ZOFRAN) 8 MG tablet, lidocaine-prilocaine (EMLA) cream, acetaminophen (TYLENOL) 325 MG tablet, diphenhydrAMINE (BENADRYL) 25 MG tablet, PHYSICIAN COMMUNICATION ORDER, CBC with Differential, Comprehensive metabolic panel, Lactate dehydrogenase, Sedimentation rate, C-reactive protein, CBC with Differential, Comprehensive metabolic panel, Lactate dehydrogenase, Sedimentation rate, Zap-70, CBC with Differential, Comprehensive metabolic panel, Lactate dehydrogenase, Sedimentation rate, C-reactive protein, sulfamethoxazole-trimethoprim (BACTRIM DS,SEPTRA DS) 800-160 MG per tablet, allopurinol (ZYLOPRIM) 300 MG tablet, acyclovir (ZOVIRAX) 400 MG tablet  CURRENT THERAPY: To start therapy consisting of Bendamustine/Rituxan shortly.  INTERVAL HISTORY: OBE AHLERS 67 y.o. male returns for  regular  visit for followup of newly diagnosed CD20+ CLL with bulky lymphadenopathy in the left cervical, left supra-clavicular, right cervical, right supraclavicular, right and left axillary areas as well as a palpably enlarged spleen.  I personally reviewed and went over pathology results with the patient.  Pathology shows a CLL with CD 20 positivity.  I personally reviewed and went over radiographic studies with the patient.  PET scan last month on 06/13/2012 showed hypermetabolic off he adenopathy involving the cervical lymph nodes, axillary lymph nodes, mediastinal lymph nodes, retroperitoneal periaortic lymph nodes and pelvic lymph nodes.  Also noted was a large hypermetabolic spleen. There is no evidence of bone metastasis.  I spent some time with the patient reviewing his pathology and scan results. We discussed the next intervention which would be systemic chemotherapy.  He has a port placed by Dr. Malvin Johns. As result, we are able to move forward with chemotherapy.  The bulk  of our time visit discussing chemotherapy. We have recommended a regimen of bendamustine and Rituxan days 1 and 2. I spent some time discussing the risks, benefits, alternatives, and side effects of the aforementioned therapy. Day 1 of therapy would consist of bendamustine and Rituxan and date 2 would be bendamustine alone. This will be on an every 28 day cycle.  We discussed some of the side effects including nausea, vomiting, diarrhea, constipation, decreasing blood counts, increased risk for infection, death, etc.  Patient education was provided regarding his diagnosis of CLL. He understands that CLL stance for chronic lymphocytic leukemia and is an abnormality of the lymphocyte cells. As a result, he has a predominance of lymphocytes causing an elevated white blood cell count. Because of his bulky adenopathy, he will require treatment as mentioned above.  We broached topic of tumor lysis syndrome. He is presently on antigout medicine, but this is not allopurinol. As a result, we will discontinue his antigout medicine and switch him to allopurinol to prevent tumor lysis syndrome. This is to be started tomorrow. This medication was E. scribed to his pharmacy. As his adenopathy decreases in size, we can discontinue the allopurinol and he may go on his original antigout medication. As result of the bulkiness of his disease and lack of initiation of anti-tumor lysis syndrome treatment, rasburicase was added to day 1 of cycle 1 of chemotherapy.  I broached topic of antiviral prophylaxis which would require a cycle of your. This medication was also E. scribed to his pharmacy.  We also discussed PCP prophylaxis with Bactrim DS on Mondays, Wednesdays, and Fridays. Review of patient's allergies does not reveal allergies or intolerances to sulfonamide.  The patient denies any complaints and ROS questioning is negative. Today was mostly spent discussing diagnosis and treatment options. Patient education was  provided today as well  Past Medical History  Diagnosis Date  .  Arthritis   . Hypertension   . Colon cancer     2005  . Chronic kidney disease   . DJD (degenerative joint disease)   . DJD (degenerative joint disease), ankle and foot   . Arthritis   . Cellulitis   . Arthritis   . Chronic back pain   . Gout   . Dysrhythmia     afibrillation  . CLL (chronic lymphocytic leukemia) 07/19/2012    has Cellulitis of leg, left; Acute on chronic kidney disease, stage 3; Morbid obesity; DJD (degenerative joint disease); Lymphedema of lower extremity; A-fib; Edema; Preop cardiovascular exam; and CLL (chronic lymphocytic leukemia) on his problem list.     is allergic to ace inhibitors; rocephin; and beta adrenergic blockers.  Mr. Halley does not currently have medications on file.  Past Surgical History  Procedure Laterality Date  . Foot surgery      right foot-  . Colon surgery    . Tonsillectomy    . Cystoscopy with urethral dilatation    . Back surgery      x2  . Cataract extraction w/phaco  01/14/2011    Procedure: CATARACT EXTRACTION PHACO AND INTRAOCULAR LENS PLACEMENT (IOC);  Surgeon: Gemma Payor;  Location: [facility];  Service: Ophthalmology;  Laterality: Left;  CDE=16.27  . Lymph node biopsy Right 07/11/2012    Procedure: CERVICAL LYMPH NODE BIOPSY;  Surgeon: Marlane Hatcher, MD;  Location: [facility];  Service: General;  Laterality: Right;  end @ 1101  . Portacath placement Right 07/11/2012    Procedure: INSERTION PORT-A-CATH;  Surgeon: Marlane Hatcher, MD;  Location: [facility];  Service: General;  Laterality: Right;    Denies any headaches, dizziness, double vision, fevers, chills, night sweats, nausea, vomiting, diarrhea, constipation, chest pain, heart palpitations, shortness of breath, blood in stool, black tarry stool, urinary pain, urinary burning, urinary frequency, hematuria.   PHYSICAL EXAMINATION  ECOG PERFORMANCE STATUS: 2 - Symptomatic, <50% confined to bed  There were  no vitals filed for this visit.  GENERAL:alert, no distress, well nourished, well developed, comfortable, cooperative, obese, smiling and in wheelchair SKIN: skin color, texture, turgor are normal, no rashes or significant lesions HEAD: Normocephalic, No masses, lesions, tenderness or abnormalities EYES: normal, PERRLA, EOMI, Conjunctiva are pink and non-injected EARS: External ears normal OROPHARYNX:mucous membranes are moist  NECK: supple, trachea midline, large bulky adenopathy LYMPH:  not examined BREAST:not examined LUNGS: not examined HEART: not examined ABDOMEN:obese BACK: No CVA tenderness EXTREMITIES:less then 2 second capillary refill, no skin discoloration, no cyanosis, positive findings:  Deformities associated with gout  NEURO: alert & oriented x 3 with fluent speech, no focal motor/sensory deficits, in wheelchair   LABORATORY DATA: CBC    Component Value Date/Time   WBC 41.5* 07/10/2012 1418   RBC 3.15* 07/10/2012 1418   HGB 9.6* 07/10/2012 1418   HCT 31.4* 07/10/2012 1418   PLT 114* 07/10/2012 1418   MCV 99.7 07/10/2012 1418   MCH 30.5 07/10/2012 1418   MCHC 30.6 07/10/2012 1418   RDW 18.8* 07/10/2012 1418   LYMPHSABS 34.0* 07/10/2012 1418   MONOABS 5.4* 07/10/2012 1418   EOSABS 0.0 07/10/2012 1418   BASOSABS 0.0 07/10/2012 1418      Chemistry      Component Value Date/Time   NA 143 07/10/2012 1418   K 4.6 07/10/2012 1418   CL 102 07/10/2012 1418   CO2 28 07/10/2012 1418   BUN 33* 07/10/2012 1418   CREATININE 2.27* 07/10/2012 1418  Component Value Date/Time   CALCIUM 9.1 07/10/2012 1418   ALKPHOS 65 06/09/2012 1044   AST 13 06/09/2012 1044   ALT 5 06/09/2012 1044   BILITOT 0.4 06/09/2012 1044       PENDING LABS: CBC diff, CMET, LDH, ESR, CRP, Hep B testing   RADIOGRAPHIC STUDIES:  06/13/2012  *RADIOLOGY REPORT*  Clinical Data: Initial treatment strategy for lymphoma. Patient  with diffuse cervical and supraclavicular adenopathy. Massive  obesity. History  of gout and therapy.  NUCLEAR MEDICINE PET SKULL BASE TO THIGH  Fasting Blood Glucose: 93  Technique: 18.2 mCi F-18 FDG was injected intravenously. CT data  was obtained and used for attenuation correction and anatomic  localization only. (This was not acquired as a diagnostic CT  examination.) Additional exam technical data entered on  technologist worksheet.  Comparison: None  Findings:  Neck: There is bulky bilateral cervical adenopathy along the  jugular chains involving level II, level III, level IV, level V  lymph nodes. For example 3.5 cm left level III lymph node (image  53) with SUV max = 8.0  Chest: There is bulky hypermetabolic axillary lymphadenopathy on  the left and right. Exemplary 2.8 cm node on the left with SUV max  = 6.6. There is smaller mediastinal and hilar lymphadenopathy  which are also hypermetabolic. No pericardial effusion. No  pulmonary parenchymal lesions.  Abdomen/Pelvis: The spleen is enlarged with a calculated volume of  1300 ml. The spleen measures 17 cm in craniocaudad dimension and  is mildly hypermetabolic with SUV max = 6.8.  There are hypermetabolic periaortic retroperitoneal lymph nodes.  For example lymph node between the IVC and aorta measures 3.6 cm  (image 180) with SUV max = 9.4.  There is bulky hypermetabolic bilateral internal and external iliac  adenopathy as well as inguinal adenopathy. Right inguinal lymph  node measures 2.8 cm ( SUV max = 5.9).  Skeleton: No focal hypermetabolic activity to suggest skeletal  metastasis.  IMPRESSION:  1. Hypermetabolic bulky adenopathy involving the cervical lymph  nodes, axillary lymph nodes, mediastinal lymph nodes,  retroperitoneal periaortic lymph nodes and pelvic lymph nodes  consistent with lymphoma.  2. Enlarged hypermetabolic spleen.  3. No evidence of bone metastasis.  Original Report Authenticated By: Genevive Bi, M.D.    PATHOLOGY:  07/11/2012  Diagnosis Lymph node for  lymphoma, right cervical - CHRONIC LYMPHOCYTIC LEUKEMIA/SMALL LYMPHOCYTIC LYMPHOMA - PLEASE SEE COMMENT. Microscopic Comment LYMPHOMA Histologic type: Chronic lymphocytic leukemia/small lymphocytic lymphoma Grade (if applicable): Low grade Flow cytometry: Please correlate with concurrent flow cytometry report [id number] Immunohistochemical stains: Immunohistochemical stains were performed and the neoplastic cells are strongly positive for CD20, CD79a, CD5, CD43, and negative for CD3, CD10, with appropriate controls. Touch preps/imprints: Chronic lymphocytic leukemia/small lymphocytic lymphoma Comments: The sections of the lymph node show completely effaced lymph node with sheets of small uniform atypical lymphocytes. The flow cytometry analysis demonstrates a monoclonal B cell population pan B cell markers CD19, CD20, CD22 and CD23 with coexpression of CD5. Immunohistochemical stains demonstrate that the tumor cells are completely negative for Cyclin D-1. The overall morphologic features and immunohistochemical pattern are diagnostic for chronic lymphocytic leukemia/small lymphocytic lymphoma. Clinical correlation is highly recommended. (HCL:kh 07-13-12) Abigail Miyamoto MD Pathologist, Electronic Signature (Case signed 07/13/2012)      Interpretation Tissue-Flow Cytometry FLOW CYTOMETRY ANALYSIS DEMONSTRATES A KAPPA LIGHT CHAIN RESTRICTED MONOCLONAL B CELL POPULATION EXPRESSING PAN B CELL MARKERS CD19, CD20, CD21, CD22 AND CD23 WITH CO-EXPRESSION OF CD5. THE OVERALL MORPHOLOGIC FEATURES ARE MOSTLY  CONSISTENT WITH CHRONIC LYMPHOCYTIC LEUKEMIA/SMALL LYMPHOCYTIC LYMPHOMA. Gwendolyn Grant LI MD Pathologist, Electronic Signature (Case signed 07/13/2012)     ASSESSMENT:  1. Newly diagnosed CD20+ CLL with bulky lymphadenopathy in the left cervical, left supra-clavicular, right cervical, right supraclavicular, right and left axillary areas as well as a palpably enlarged spleen. 2. Gout 3.  Atrial fibrillation, on Coumadin managed by Cardiology  Patient Active Problem List   Diagnosis Date Noted  . 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 A-fib 06/28/2012  . Edema 06/28/2012  . Preop cardiovascular exam 06/28/2012  . Cellulitis of leg, left 10/15/2010  . Acute on chronic kidney disease, stage 3 10/15/2010  . Morbid obesity 10/15/2010  . DJD (degenerative joint disease) 10/15/2010  . Lymphedema of lower extremity 10/15/2010    PLAN:  1. I personally reviewed and went over laboratory results with the patient. 2. I personally reviewed and went over radiographic studies with the patient. 3. I personally reviewed and went over pathology results with the patient. 4. Pre-chemo labs today: CBC diff, CMET, LDH, ESR, CRP, Hep B testing 5. Treatment plan created: Bendamustine D1 + D2, Rituxan D1 every 28 days. 6. Ulorik, on hold x 6-8 weeks or until adenopathy has decreased significantly.  May be restarted at that time. 7. Allopurinol 300 mg daily to prevent tumor lysis syndrome.  May be discontinued and Ulorik restarted after bulky adenopathy improves.  This medication is escribed 8. Bactrim DS for PCP prophylaxis.  M-W-F.  This is escribed to pharmacy 9. Acyclovir for anti-viral prophylaxis.  400 mg BID 10. Rasburicase added to Day 1 of Cycle 1. 11. Patient education regarding diagnosis 12. Patient education regarding treatment recommendations. 13. Risks, benefits, alternatives, and side effects of therapy discussed. 14. Return in 3-4 weeks for follow-up.   THERAPY PLAN:  We will push on with therapy and began that tomorrow. Goal is to perform 3 cycles of chemotherapy and restage him with repeat scans. In that we would push onto 6 cycles total with the opportunity to add 2 more cycles of needed. Could develop a maintenance program following completion of aforementioned chemotherapy plan consisting of Rituxan every 90 days depending on attendings treatment plan/opinion.  We'll get started with chemotherapy, his first cycle, and see him back following.  All questions were answered. The patient knows to call the clinic with any problems, questions or concerns. We can certainly see the patient much sooner if necessary.  Patient and plan discussed with Dr. Gerarda Fraction and he is in agreement with the aforementioned.  Cheray Pardi

## 2012-07-19 NOTE — Assessment & Plan Note (Signed)
His last labs demonstrate a creatinine of 2.27.  He is on demedex 20 mg daily and continues to have dependent edema of the lower extremities. It is not worse than on previous labs. Will change him to demedex 20 mg every other day. Follow up labs will be completed by oncology.

## 2012-07-19 NOTE — Patient Instructions (Addendum)
Your physician recommends that you schedule a follow-up appointment in: 1 month with Dr Eden Emms  Your physician has recommended you make the following change in your medication: 1. Start Coumadin 5 mg daily.

## 2012-07-19 NOTE — Assessment & Plan Note (Addendum)
He remains in atrial fibrillation rate controlled on diltiazem. He is not on anticoagulation at this time. He has had recent biopsy with lymph node evaluation in June with pathology pending results. I am reluctant to begin anticoagulation until speaking with oncology to ascertain if they are planning more surgery or other treatments that would prohibit use of anticoagulation. I am contacting Jenita Seashore PA for oncology to discuss. He is fine with use of coumadin. He will be started on 5 mg daily. He will need to be followed in one week for PT-INR. The patient wishes to follow with oncology for dosing as he is having so many lab draws done during chemo. If he cannot have labs completed in one week per oncology we will make appt with our coumadin clinic for one week. Most recent INR 1.09 in May of 2014.

## 2012-07-19 NOTE — Patient Instructions (Addendum)
[facility]   CHEMOTHERAPY INSTRUCTIONS  Rituxan - Before taking Rituxan you need to take Tylenol 650mg  and Benadryl 50mg  1 hour before the Rituxan. You can take this at home. This reduces your risk of having an allergic reaction to the Rituxan. You will do this each time prior to Rituxan. Side Effects: during infusion - itching, low blood pressure, low oxygen, bronchospasm, rash, trouble breathing - we need to know immediately if any of this happens. The first time you receive this drug, it takes a long time to infuse because we titrate the drug very slowly. With each Rituxan infusion, the likelihood of developing an infusion reaction decreases. You may also experience fever, chills, shaking chills, headaches, muscle aches, nausea, rash, and a low white blood cell count. We need to be sure that you are drinking plenty of fluids - preferably 64oz of decaff fluids/water daily. It is best to start drinking fluids 2 days prior to treatment and for up to 4-5 days after treatment. As your tumor breaks down, it leaves behind uric acid and the extra fluid that you drink helps to flush this out of your body. You will also be on a medication called Allopurinol while taking Rituxan which help rid your body of the uric acid. It is important that you take this medication daily as prescribed.   Bendamustine - low white blood cell count, low platelet count, fever, nausea, vomiting. This infuses over 30-60 minutes.   Rasburicase - given to protect the kidneys from the breakdown of the tumor in the body. The tumor breaks down into uric acid. The Rasburicase protects the kidneys from uric acid accumulation which can lead to kidney failure. You will only receive this before the first cycle of chemo.   POTENTIAL SIDE EFFECTS OF TREATMENT: Increased Susceptibility to Infection, Constipation, Hair Thinning, Changes in Character of Skin and Nails (brittleness, dryness,etc.), Bone Marrow  Suppression and Nausea   EDUCATIONAL MATERIALS GIVEN AND REVIEWED: Chemotherapy and You  Specific Instructions Sheets on Rituxan, Bendamustine, Rasburicase, Zofran, Dexamethasone, Bactrim, Allopurinol, Acyclovir, EMLA cream   SELF CARE ACTIVITIES WHILE ON CHEMOTHERAPY: Increase your fluid intake 48 hours prior to treatment and drink at least 2 quarts per day after treatment., No alcohol intake., No aspirin or other medications unless approved by your oncologist., Eat foods that are light and easy to digest., Eat foods at cold or room temperature., No fried, fatty, or spicy foods immediately before or after treatment., Have teeth cleaned professionally before starting treatment. Keep dentures and partial plates clean., Use soft toothbrush and do not use mouthwashes that contain alcohol. Biotene is a good mouthwash that is available at most pharmacies or may be ordered by calling [phone]., Use warm salt water gargles (1 teaspoon salt per 1 quart warm water) before and after meals and at bedtime. Or you may rinse with 2 tablespoons of three -percent hydrogen peroxide mixed in eight ounces of water., Always use sunscreen with SPF (Sun Protection Factor) of 30 or higher., Use your nausea medication as directed to prevent nausea., Use your stool softener or laxative as directed to prevent constipation. and Use your anti-diarrheal medication as directed to stop diarrhea.  Please wash your hands for at least 30 seconds using warm soapy water. Handwashing is the #1 way to prevent the spread of germs. Stay away from sick people or people who are getting over a cold. If you develop respiratory systems such as green/yellow mucus production or productive cough or  persistent cough let us know and we will see if you need an antibiotic. It is a good idea to keep a pair of gloves on when going into grocery stores/[employer] to decrease your risk of coming into contact with germs on the carts, etc. Carry alcohol hand  gel with you at all times and use it frequently if out in public. All foods need to be cooked thoroughly. No raw foods. No medium or undercooked meats, eggs. If your food is cooked medium well, it does not need to be hot pink or saturated with bloody liquid at all. Vegetables and fruits need to be washed/rinsed under the faucet with a dish detergent before being consumed. You can eat raw fruits and vegetables unless we tell you otherwise but it would be best if you cooked them or bought frozen. Do not eat off of salad bars or hot bars unless you really trust the cleanliness of the restaurant. If you need dental work, please let Dr. Mariel Sleet know before you go for your appointment so that we can coordinate the best possible time for you in regards to your chemo regimen. You need to also let your dentist know that you are actively taking chemo. We may need to do labs prior to your dental appointment. We also want your bowels moving at least every other day. If this is not happening, we need to know so that we can get you on a bowel regimen to help you go.    MEDICATIONS: You have been given prescriptions for the following medications:  Dexamethasone 4mg  tablet. Starting the day after chemo, take 2 tabs in the am and 2 tabs in the pm for 2 days. Then Stop. Take med with food.   Zofran/Ondansetron 8mg  tablet. Starting the day after chemo, take 1 tab in the am and 1 tab in the pm for 2 days. Then may take 1 tab two times a day IF needed for nausea/vomiting.   EMLA Cream.  Apply quarter size glob to port site 1 hour prior to chemotherapy. Do NOT rub in. Cover with plastic wrap.  Acyclovir 400mg  tablet. Take 1 tablet two times a day throughout length of chemotherapy.  Bactrim DS. Take 1 tablet on Mondays, Wednesdays, and Fridays throughout length of chemotherapy.   Allopurinol 300mg  tablet. Take 1 tablet daily throughout length of chemotherapy.  Tylenol 325mg  tablet. Take 2 tablets 1 hour prior to  Rituxan treatment.   Benadryl 25mg  tablet. Take 2 tablets 1 hour prior to Rituxan treatment.   Stop Urolic for now (while taking allopurinol). We will tell you when to stop taking Allopurinol and start taking Uloric again.    Over-the-Counter Meds:  Colace - this is a stool softener. Take 100mg  capsule 2-6 times a day as needed. If you have to take more than 6 capsules of Colace a day call the Cancer Center.  Senna - this is a mild laxative used to treat mild constipation. May take 2 tabs by mouth daily or up to twice a day as needed for mild constipation.  Milk of Magnesia - this is a laxative used to treat moderate to severe constipation. May take 2-4 tablespoons every 8 hours as needed. May increase to 8 tablespoons x 1 dose and if no bowel movement call the Cancer Center.  Imodium - this is for diarrhea. Take 2 tabs after 1st loose stool and then 1 tab after each loose stool until you go a total of 12 hours without a loose stool. Call  Cancer Center if loose stools continue.    SYMPTOMS TO REPORT AS SOON AS POSSIBLE AFTER TREATMENT:  FEVER GREATER THAN 100.5 F  CHILLS WITH OR WITHOUT FEVER  NAUSEA AND VOMITING THAT IS NOT CONTROLLED WITH YOUR NAUSEA MEDICATION  UNUSUAL SHORTNESS OF BREATH  UNUSUAL BRUISING OR BLEEDING  TENDERNESS IN MOUTH AND THROAT WITH OR WITHOUT PRESENCE OF ULCERS  URINARY PROBLEMS  BOWEL PROBLEMS  UNUSUAL RASH    Wear comfortable clothing and clothing appropriate for easy access to any Portacath or PICC line. Let us know if there is anything that we can do to make your therapy better!      I have been informed and understand all of the instructions given to me and have received a copy. I have been instructed to call the clinic [phone] or my family physician as soon as possible for continued medical care, if indicated. I do not have any more questions at this time but understand that I may call the Cancer Center or the Patient Navigator at  [phone] during office hours should I have questions or need assistance in obtaining follow-up care.      _________________________________________      _______________     __________ Signature of Patient or Authorized Representative        Date                            Time      _________________________________________ Nurse's Signature

## 2012-07-20 ENCOUNTER — Telehealth: Payer: Self-pay | Admitting: Cardiology

## 2012-07-20 ENCOUNTER — Encounter (HOSPITAL_BASED_OUTPATIENT_CLINIC_OR_DEPARTMENT_OTHER): Payer: Medicare Other

## 2012-07-20 VITALS — BP 104/61 | HR 95 | Temp 97.1°F | Resp 20 | Wt 326.0 lb

## 2012-07-20 DIAGNOSIS — Z5111 Encounter for antineoplastic chemotherapy: Secondary | ICD-10-CM

## 2012-07-20 DIAGNOSIS — T7840XA Allergy, unspecified, initial encounter: Secondary | ICD-10-CM

## 2012-07-20 DIAGNOSIS — C9110 Chronic lymphocytic leukemia of B-cell type not having achieved remission: Secondary | ICD-10-CM

## 2012-07-20 DIAGNOSIS — Z7901 Long term (current) use of anticoagulants: Secondary | ICD-10-CM

## 2012-07-20 LAB — C-REACTIVE PROTEIN: CRP: 1.3 mg/dL — ABNORMAL HIGH (ref <0.60)

## 2012-07-20 MED ORDER — SODIUM CHLORIDE 0.9 % IV SOLN
Freq: Once | INTRAVENOUS | Status: AC
  Administered 2012-07-20: 8 mg via INTRAVENOUS
  Filled 2012-07-20: qty 4

## 2012-07-20 MED ORDER — SODIUM CHLORIDE 0.9 % IV SOLN
Freq: Once | INTRAVENOUS | Status: AC
  Administered 2012-07-20: 09:00:00 via INTRAVENOUS

## 2012-07-20 MED ORDER — SODIUM CHLORIDE 0.9 % IV SOLN
375.0000 mg/m2 | Freq: Once | INTRAVENOUS | Status: AC
  Administered 2012-07-20: 1000 mg via INTRAVENOUS
  Filled 2012-07-20: qty 100

## 2012-07-20 MED ORDER — SODIUM CHLORIDE 0.9 % IV SOLN: 8.0000 mg | Freq: Once | INTRAVENOUS | Status: DC

## 2012-07-20 MED ORDER — HEPARIN SOD (PORK) LOCK FLUSH 100 UNIT/ML IV SOLN
500.0000 [IU] | Freq: Once | INTRAVENOUS | Status: AC | PRN
  Administered 2012-07-20: 500 [IU]
  Filled 2012-07-20: qty 5

## 2012-07-20 MED ORDER — HYDROCORTISONE SOD SUCCINATE 100 MG IJ SOLR
100.0000 mg | Freq: Once | INTRAMUSCULAR | Status: AC
  Administered 2012-07-20: 100 mg via INTRAVENOUS

## 2012-07-20 MED ORDER — DIPHENHYDRAMINE HCL 50 MG/ML IJ SOLN
INTRAMUSCULAR | Status: AC
  Filled 2012-07-20: qty 1

## 2012-07-20 MED ORDER — SODIUM CHLORIDE 0.9 % IJ SOLN
10.0000 mL | INTRAMUSCULAR | Status: DC | PRN
  Administered 2012-07-20: 10 mL
  Filled 2012-07-20: qty 10

## 2012-07-20 MED ORDER — HYDROCORTISONE SOD SUCCINATE 100 MG IJ SOLR
INTRAMUSCULAR | Status: AC
  Filled 2012-07-20: qty 2

## 2012-07-20 MED ORDER — SODIUM CHLORIDE 0.9 % IV SOLN
6.0000 mg | Freq: Once | INTRAVENOUS | Status: AC
  Administered 2012-07-20: 6 mg via INTRAVENOUS
  Filled 2012-07-20: qty 4

## 2012-07-20 MED ORDER — SODIUM CHLORIDE 0.9 % IV SOLN
100.0000 mg/m2 | Freq: Once | INTRAVENOUS | Status: AC
  Administered 2012-07-20: 270 mg via INTRAVENOUS
  Filled 2012-07-20: qty 54

## 2012-07-20 MED ORDER — FAMOTIDINE IN NACL 20-0.9 MG/50ML-% IV SOLN
INTRAVENOUS | Status: AC
  Filled 2012-07-20: qty 50

## 2012-07-20 MED ORDER — DEXAMETHASONE SODIUM PHOSPHATE 10 MG/ML IJ SOLN: 10.0000 mg | Freq: Once | INTRAMUSCULAR | Status: DC

## 2012-07-20 NOTE — Progress Notes (Signed)
1152 patient c/o tightness in chest and trouble breathing. rituxan stopped. vss and Dr. Doree Barthel notified. Some relief of symptoms noted. Solu cortef given for some residual shortness of breath and complete resolution of symptoms obtained. Rituxan restarted at 100 mg/hr.  1255 tolerating without problems.  1610 tolerated remainder of rituxan without problems.  1805 escorted out per w/. No complaints voiced.

## 2012-07-20 NOTE — Telephone Encounter (Signed)
Please advise if this can be done for pt per policy with our office in absence of PN

## 2012-07-20 NOTE — Telephone Encounter (Signed)
[facility] is [facility].  Can we put in order for them to draw weekly PT's with this blood? / tgs

## 2012-07-21 ENCOUNTER — Encounter (HOSPITAL_BASED_OUTPATIENT_CLINIC_OR_DEPARTMENT_OTHER): Payer: Medicare Other

## 2012-07-21 VITALS — BP 119/67 | HR 84 | Temp 98.2°F | Resp 18

## 2012-07-21 DIAGNOSIS — Z5111 Encounter for antineoplastic chemotherapy: Secondary | ICD-10-CM

## 2012-07-21 DIAGNOSIS — C9110 Chronic lymphocytic leukemia of B-cell type not having achieved remission: Secondary | ICD-10-CM

## 2012-07-21 MED ORDER — HEPARIN SOD (PORK) LOCK FLUSH 100 UNIT/ML IV SOLN
INTRAVENOUS | Status: AC
  Filled 2012-07-21: qty 5

## 2012-07-21 MED ORDER — SODIUM CHLORIDE 0.9 % IJ SOLN
10.0000 mL | INTRAMUSCULAR | Status: DC | PRN
  Administered 2012-07-21: 10 mL
  Filled 2012-07-21: qty 10

## 2012-07-21 MED ORDER — SODIUM CHLORIDE 0.9 % IV SOLN
Freq: Once | INTRAVENOUS | Status: AC
  Administered 2012-07-21: 8 mg via INTRAVENOUS
  Filled 2012-07-21: qty 4

## 2012-07-21 MED ORDER — HEPARIN SOD (PORK) LOCK FLUSH 100 UNIT/ML IV SOLN
500.0000 [IU] | Freq: Once | INTRAVENOUS | Status: AC | PRN
  Administered 2012-07-21: 500 [IU]
  Filled 2012-07-21: qty 5

## 2012-07-21 MED ORDER — SODIUM CHLORIDE 0.9 % IV SOLN
100.0000 mg/m2 | Freq: Once | INTRAVENOUS | Status: AC
  Administered 2012-07-21: 270 mg via INTRAVENOUS
  Filled 2012-07-21: qty 54

## 2012-07-21 MED ORDER — SODIUM CHLORIDE 0.9 % IV SOLN
Freq: Once | INTRAVENOUS | Status: AC
  Administered 2012-07-21: 10:00:00 via INTRAVENOUS

## 2012-07-21 NOTE — Telephone Encounter (Signed)
.  left message to have patient return my call.  

## 2012-07-21 NOTE — Telephone Encounter (Signed)
Yes, PT INR can be drawn with other labs.

## 2012-07-21 NOTE — Progress Notes (Signed)
Tolerated chemo well. 

## 2012-07-23 LAB — ZAP-70: ZAP-70: 25 % — ABNORMAL HIGH (ref <10)

## 2012-07-24 ENCOUNTER — Encounter (HOSPITAL_BASED_OUTPATIENT_CLINIC_OR_DEPARTMENT_OTHER): Payer: Medicare Other

## 2012-07-24 ENCOUNTER — Other Ambulatory Visit: Payer: Self-pay | Admitting: *Deleted

## 2012-07-24 DIAGNOSIS — I4891 Unspecified atrial fibrillation: Secondary | ICD-10-CM

## 2012-07-24 DIAGNOSIS — C9110 Chronic lymphocytic leukemia of B-cell type not having achieved remission: Secondary | ICD-10-CM

## 2012-07-24 LAB — CBC
Hemoglobin: 8 g/dL — ABNORMAL LOW (ref 13.0–17.0)
Platelets: 85 10*3/uL — ABNORMAL LOW (ref 150–400)
RBC: 2.53 MIL/uL — ABNORMAL LOW (ref 4.22–5.81)
WBC: 2.2 10*3/uL — ABNORMAL LOW (ref 4.0–10.5)

## 2012-07-24 LAB — ABO/RH: ABO/RH(D): A POS

## 2012-07-24 LAB — PROTIME-INR
INR: 1.29 (ref 0.00–1.49)
Prothrombin Time: 15.8 seconds — ABNORMAL HIGH (ref 11.6–15.2)

## 2012-07-24 LAB — PREPARE RBC (CROSSMATCH)

## 2012-07-24 NOTE — Progress Notes (Signed)
Alex Petty's reason for visit today are for labs as scheduled per MD orders.  Venipuncture performed with a 23 gauge butterfly needle to L Antecubital.  Alex Petty tolerated venipuncture well and without incident; questions were answered and patient was discharged.

## 2012-07-24 NOTE — Addendum Note (Signed)
Addended by: Dennie Maizes on: 07/24/2012 01:44 PM   Modules accepted: Orders, SmartSet

## 2012-07-25 ENCOUNTER — Other Ambulatory Visit (HOSPITAL_COMMUNITY): Payer: Self-pay | Admitting: Oncology

## 2012-07-25 ENCOUNTER — Encounter (HOSPITAL_BASED_OUTPATIENT_CLINIC_OR_DEPARTMENT_OTHER): Payer: Medicare Other

## 2012-07-25 VITALS — BP 125/71 | HR 75 | Temp 97.7°F | Resp 20

## 2012-07-25 DIAGNOSIS — I4891 Unspecified atrial fibrillation: Secondary | ICD-10-CM

## 2012-07-25 DIAGNOSIS — C9110 Chronic lymphocytic leukemia of B-cell type not having achieved remission: Secondary | ICD-10-CM

## 2012-07-25 MED ORDER — SODIUM CHLORIDE 0.9 % IJ SOLN
10.0000 mL | INTRAMUSCULAR | Status: AC | PRN
  Administered 2012-07-25: 10 mL
  Filled 2012-07-25: qty 10

## 2012-07-25 MED ORDER — SODIUM CHLORIDE 0.9 % IV SOLN
250.0000 mL | Freq: Once | INTRAVENOUS | Status: AC
  Administered 2012-07-25: 250 mL via INTRAVENOUS

## 2012-07-25 MED ORDER — HEPARIN SOD (PORK) LOCK FLUSH 100 UNIT/ML IV SOLN
500.0000 [IU] | Freq: Every day | INTRAVENOUS | Status: AC | PRN
  Administered 2012-07-25: 500 [IU]
  Filled 2012-07-25: qty 5

## 2012-07-25 MED ORDER — HEPARIN SOD (PORK) LOCK FLUSH 100 UNIT/ML IV SOLN
INTRAVENOUS | Status: AC
  Filled 2012-07-25: qty 5

## 2012-07-25 NOTE — Progress Notes (Signed)
Tolerated  RBC transfusion well.

## 2012-07-26 ENCOUNTER — Other Ambulatory Visit (HOSPITAL_COMMUNITY): Payer: Medicare Other

## 2012-07-26 LAB — TYPE AND SCREEN
ABO/RH(D): A POS
Antibody Screen: NEGATIVE
Unit division: 0
Unit division: 0

## 2012-07-27 NOTE — Telephone Encounter (Signed)
Ok

## 2012-07-27 NOTE — Telephone Encounter (Signed)
Called pt and he advised that the cancer center informed him that he needs to have his Pt/INR checked weekly while they are drawing his other labs they can do this for him at the same time, standing order was placed in chart per notation from [facility] NP, advised pt that I will inform DR PN about the instructions from chemo concerning PT checks and that we will contact him back about the blood draw, pt understood, please advise per noted pt was started on coumadin at last OV on 07-19-12

## 2012-07-28 ENCOUNTER — Encounter (HOSPITAL_BASED_OUTPATIENT_CLINIC_OR_DEPARTMENT_OTHER): Payer: Medicare Other | Admitting: Oncology

## 2012-07-28 ENCOUNTER — Telehealth (HOSPITAL_COMMUNITY): Payer: Self-pay

## 2012-07-28 VITALS — BP 94/56 | HR 93 | Temp 98.3°F | Resp 20

## 2012-07-28 DIAGNOSIS — R110 Nausea: Secondary | ICD-10-CM

## 2012-07-28 DIAGNOSIS — M549 Dorsalgia, unspecified: Secondary | ICD-10-CM

## 2012-07-28 DIAGNOSIS — R21 Rash and other nonspecific skin eruption: Secondary | ICD-10-CM

## 2012-07-28 LAB — CBC WITH DIFFERENTIAL/PLATELET
Basophils Relative: 0 % (ref 0–1)
Eosinophils Relative: 1 % (ref 0–5)
Hemoglobin: 8.6 g/dL — ABNORMAL LOW (ref 13.0–17.0)
Lymphocytes Relative: 12 % (ref 12–46)
MCH: 31.9 pg (ref 26.0–34.0)
Monocytes Absolute: 0 10*3/uL — ABNORMAL LOW (ref 0.1–1.0)
Neutrophils Relative %: 85 % — ABNORMAL HIGH (ref 43–77)
Platelets: 97 10*3/uL — ABNORMAL LOW (ref 150–400)
RBC: 2.7 MIL/uL — ABNORMAL LOW (ref 4.22–5.81)
WBC: 1.1 10*3/uL — CL (ref 4.0–10.5)

## 2012-07-28 LAB — BASIC METABOLIC PANEL
BUN: 54 mg/dL — ABNORMAL HIGH (ref 6–23)
Chloride: 98 mEq/L (ref 96–112)
Creatinine, Ser: 2.23 mg/dL — ABNORMAL HIGH (ref 0.50–1.35)
GFR calc Af Amer: 33 mL/min — ABNORMAL LOW (ref >90)
GFR calc non Af Amer: 29 mL/min — ABNORMAL LOW (ref >90)
Potassium: 5.2 mEq/L — ABNORMAL HIGH (ref 3.5–5.1)

## 2012-07-28 MED ORDER — HEPARIN SOD (PORK) LOCK FLUSH 100 UNIT/ML IV SOLN
INTRAVENOUS | Status: AC
  Filled 2012-07-28: qty 5

## 2012-07-28 MED ORDER — MORPHINE SULFATE 2 MG/ML IJ SOLN: 2.0000 mg | Freq: Once | INTRAMUSCULAR | Status: DC

## 2012-07-28 MED ORDER — SODIUM CHLORIDE 0.9 % IJ SOLN
10.0000 mL | INTRAMUSCULAR | Status: DC | PRN
  Administered 2012-07-28: 10 mL via INTRAVENOUS
  Filled 2012-07-28: qty 10

## 2012-07-28 MED ORDER — SODIUM CHLORIDE 0.9 % IV SOLN
INTRAVENOUS | Status: DC
  Administered 2012-07-28: 13:00:00 via INTRAVENOUS

## 2012-07-28 MED ORDER — DEXAMETHASONE SODIUM PHOSPHATE 10 MG/ML IJ SOLN
INTRAMUSCULAR | Status: AC
  Filled 2012-07-28: qty 1

## 2012-07-28 MED ORDER — MUPIROCIN 2 % EX OINT: TOPICAL_OINTMENT | Freq: Three times a day (TID) | CUTANEOUS | Status: AC

## 2012-07-28 MED ORDER — MORPHINE SULFATE 2 MG/ML IJ SOLN
INTRAMUSCULAR | Status: AC
  Filled 2012-07-28: qty 1

## 2012-07-28 MED ORDER — CYCLOBENZAPRINE HCL 10 MG PO TABS: 10.0000 mg | ORAL_TABLET | Freq: Three times a day (TID) | ORAL | Status: DC | PRN

## 2012-07-28 MED ORDER — SODIUM CHLORIDE 0.9 % IV SOLN
8.0000 mg | Freq: Once | INTRAVENOUS | Status: AC
  Administered 2012-07-28: 8 mg via INTRAVENOUS
  Filled 2012-07-28: qty 4

## 2012-07-28 MED ORDER — MORPHINE SULFATE 2 MG/ML IJ SOLN
2.0000 mg | Freq: Once | INTRAMUSCULAR | Status: AC
  Administered 2012-07-28: 2 mg via INTRAVENOUS

## 2012-07-28 MED ORDER — DEXAMETHASONE SODIUM PHOSPHATE 10 MG/ML IJ SOLN
10.0000 mg | Freq: Once | INTRAMUSCULAR | Status: AC
  Administered 2012-07-28: 10 mg via INTRAVENOUS

## 2012-07-28 MED ORDER — HEPARIN SOD (PORK) LOCK FLUSH 100 UNIT/ML IV SOLN
500.0000 [IU] | Freq: Once | INTRAVENOUS | Status: AC
  Administered 2012-07-28: 500 [IU] via INTRAVENOUS
  Filled 2012-07-28: qty 5

## 2012-07-28 MED ORDER — VALACYCLOVIR HCL 1 G PO TABS: 1000.0000 mg | ORAL_TABLET | Freq: Three times a day (TID) | ORAL | Status: AC

## 2012-07-28 NOTE — Patient Instructions (Signed)
[facility] Discharge Instructions  RECOMMENDATIONS MADE BY THE CONSULTANT AND ANY TEST RESULTS WILL BE SENT TO YOUR REFERRING PHYSICIAN.  EXAM FINDINGS BY THE PHYSICIAN TODAY AND SIGNS OR SYMPTOMS TO REPORT TO CLINIC OR PRIMARY PHYSICIAN: Exam and discussion by PA.  Think you could either have shingles or bacterial infections  MEDICATIONS PRESCRIBED:  Acyclovir 400 mg take twice daily Flexeril 10 mg every 8 hours as needed for muscle spasm Bactroban ointment - apply to area three times daily.  INSTRUCTIONS GIVEN AND DISCUSSED: Report fevers, chills itc.  SPECIAL INSTRUCTIONS/FOLLOW-UP: As scheduled.  Thank you for choosing [facility] to provide your oncology and hematology care.  To afford each patient quality time with our providers, please arrive at least 15 minutes before your scheduled appointment time.  With your help, our goal is to use those 15 minutes to complete the necessary work-up to ensure our physicians have the information they need to help with your evaluation and healthcare recommendations.    Effective January 1st, 2014, we ask that you re-schedule your appointment with our physicians should you arrive 10 or more minutes late for your appointment.  We strive to give you quality time with our providers, and arriving late affects you and other patients whose appointments are after yours.    Again, thank you for choosing [facility].  Our hope is that these requests will decrease the amount of time that you wait before being seen by our physicians.       _____________________________________________________________  Should you have questions after your visit to [facility], please contact our office at [phone] between the hours of 8:30 a.m. and 5:00 p.m.  Voicemails left after 4:30 p.m. will not be returned until the following business day.  For prescription refill requests, have your pharmacy contact our  office with your prescription refill request.

## 2012-07-28 NOTE — Progress Notes (Signed)
Patient is dizzy, lightheaded, nauseated, and has a back pain level 10. Painful rash to rt groin.

## 2012-07-28 NOTE — Telephone Encounter (Signed)
CRITICAL VALUE ALERT Critical value received:  WBC 1.1 Date of notification:  07/28/12  Time of notification: 1545 Critical value read back:  yes Nurse who received alert:  Tobie Lords, RN Dellis Anes, PA-C notified.

## 2012-07-28 NOTE — Progress Notes (Signed)
Patient called with a rash of legs and therefore I asked the nurses to bring him into the clinic for potential drug rash.   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  He also reported nausea.  Unfortunately, his back pain precludes [place] from evaluating his rash because his rash is on the right thigh because he is unable to stand to lower his pants.  As a result, I will give him 2 mg of IV morphine.  Hopefully this will ease his pain and thus allow me to examine his rash.  He reports that the rash began yesterday.  It is tender.  Nonpruritic. He explains that it has a burning and tingling pain.   As a result of his back pain, I have asked the nurse to take him to the chemo area and administer 2 mg IV morphine plus 8 mg IV Zofran.   Once his pain was better controlled, he was escorted to a chemo bed and his pants were able to be lowered.   BP 94/56  Pulse 93  Temp(Src) 98.3 F (36.8 C) (Oral)  Resp 20 Gen: Obviously in pain due to back discomfort.  Nauseated HEENT: Atraumatic, normocephalic Port: Intact, no erythema Extremities: Disfigured from gout.  + B/L LE edema\ Skin: Right L2 dermatome rash that has pustular lesions measuring less than 3 mm with erythematous base.  Erythema in the area is noted medially as well. Pustules noted in lines and is limited to the anterior section of thigh.   Neck: Less bulky from adenopathy on inspection Neuro: A and O x 3.   CBC    Component Value Date/Time   WBC 1.1* 07/28/2012 1334   RBC 2.70* 07/28/2012 1334   HGB 8.6* 07/28/2012 1334   HCT 26.1* 07/28/2012 1334   PLT 97* 07/28/2012 1334   MCV 96.7 07/28/2012 1334   MCH 31.9 07/28/2012 1334   MCHC 33.0 07/28/2012 1334   RDW 18.4* 07/28/2012 1334   LYMPHSABS 0.1* 07/28/2012 1334   MONOABS 0.0* 07/28/2012 1334   EOSABS 0.0 07/28/2012 1334   BASOSABS 0.0 07/28/2012 1334      Chemistry      Component Value Date/Time   NA  133* 07/28/2012 1334   K 5.2* 07/28/2012 1334   CL 98 07/28/2012 1334   CO2 25 07/28/2012 1334   BUN 54* 07/28/2012 1334   CREATININE 2.23* 07/28/2012 1334      Component Value Date/Time   CALCIUM 8.0* 07/28/2012 1334   ALKPHOS 73 07/19/2012 1611   AST 10 07/19/2012 1611   ALT 5 07/19/2012 1611   BILITOT 0.5 07/19/2012 1611       Assessment: 1. Bacterial infection versus Herpes Zoster 2. CLL, S/P cycle 1 of Bendamustine/Rituxan on 07/26/2012 3. Leukopenia, ANC 1.0  Plan: 1. 2 mg IV Morphine 2. 8 mg IV Zofran 3. 10 mg Of IV Dexamethasone 4. Rx for Flexeril 10 mg TID PRN #30 5. Bactoban ointment 2% apply TID 6. Valtrex 1000 mg TID x 14 days 7. CBC diff, BMET 8. Patient informed to increase PO fluids.  9. Labs on Monday as scheduled 10. Return as scheduled.   Patient seen and examined by Dr. Erline Hau as well.  He believes the infection is bacterial.  I tend to believe it appears like shingles with tingling and burning.  Will treat patient for both bacteria and as shingles.   Lizanne Erker

## 2012-07-30 ENCOUNTER — Encounter (HOSPITAL_COMMUNITY): Payer: Self-pay | Admitting: *Deleted

## 2012-07-30 ENCOUNTER — Emergency Department (HOSPITAL_COMMUNITY)
Admission: EM | Admit: 2012-07-30 | Discharge: 2012-07-30 | Disposition: A | Discharge status: Home / Self Care | Payer: Medicare Other | Source: Home / Self Care | Attending: Emergency Medicine | Admitting: Emergency Medicine

## 2012-07-30 DIAGNOSIS — K560 Paralytic ileus: Secondary | ICD-10-CM | POA: Diagnosis not present

## 2012-07-30 DIAGNOSIS — I129 Hypertensive chronic kidney disease with stage 1 through stage 4 chronic kidney disease, or unspecified chronic kidney disease: Secondary | ICD-10-CM | POA: Diagnosis present

## 2012-07-30 DIAGNOSIS — Z7901 Long term (current) use of anticoagulants: Secondary | ICD-10-CM

## 2012-07-30 DIAGNOSIS — G8929 Other chronic pain: Secondary | ICD-10-CM | POA: Insufficient documentation

## 2012-07-30 DIAGNOSIS — R6521 Severe sepsis with septic shock: Secondary | ICD-10-CM | POA: Diagnosis present

## 2012-07-30 DIAGNOSIS — T451X5A Adverse effect of antineoplastic and immunosuppressive drugs, initial encounter: Secondary | ICD-10-CM | POA: Diagnosis present

## 2012-07-30 DIAGNOSIS — R609 Edema, unspecified: Secondary | ICD-10-CM | POA: Diagnosis not present

## 2012-07-30 DIAGNOSIS — I4891 Unspecified atrial fibrillation: Secondary | ICD-10-CM | POA: Diagnosis present

## 2012-07-30 DIAGNOSIS — N189 Chronic kidney disease, unspecified: Secondary | ICD-10-CM | POA: Insufficient documentation

## 2012-07-30 DIAGNOSIS — G92 Toxic encephalopathy: Secondary | ICD-10-CM | POA: Diagnosis present

## 2012-07-30 DIAGNOSIS — A419 Sepsis, unspecified organism: Secondary | ICD-10-CM | POA: Diagnosis present

## 2012-07-30 DIAGNOSIS — Z7902 Long term (current) use of antithrombotics/antiplatelets: Secondary | ICD-10-CM | POA: Insufficient documentation

## 2012-07-30 DIAGNOSIS — A4101 Sepsis due to Methicillin susceptible Staphylococcus aureus: Principal | ICD-10-CM | POA: Diagnosis present

## 2012-07-30 DIAGNOSIS — Z85038 Personal history of other malignant neoplasm of large intestine: Secondary | ICD-10-CM | POA: Insufficient documentation

## 2012-07-30 DIAGNOSIS — Z8679 Personal history of other diseases of the circulatory system: Secondary | ICD-10-CM | POA: Insufficient documentation

## 2012-07-30 DIAGNOSIS — Z856 Personal history of leukemia: Secondary | ICD-10-CM | POA: Insufficient documentation

## 2012-07-30 DIAGNOSIS — M79609 Pain in unspecified limb: Secondary | ICD-10-CM | POA: Insufficient documentation

## 2012-07-30 DIAGNOSIS — G929 Unspecified toxic encephalopathy: Secondary | ICD-10-CM | POA: Diagnosis present

## 2012-07-30 DIAGNOSIS — N179 Acute kidney failure, unspecified: Secondary | ICD-10-CM | POA: Diagnosis present

## 2012-07-30 DIAGNOSIS — C9110 Chronic lymphocytic leukemia of B-cell type not having achieved remission: Secondary | ICD-10-CM | POA: Diagnosis present

## 2012-07-30 DIAGNOSIS — L02419 Cutaneous abscess of limb, unspecified: Secondary | ICD-10-CM | POA: Diagnosis present

## 2012-07-30 DIAGNOSIS — L738 Other specified follicular disorders: Secondary | ICD-10-CM | POA: Diagnosis present

## 2012-07-30 DIAGNOSIS — M109 Gout, unspecified: Secondary | ICD-10-CM | POA: Insufficient documentation

## 2012-07-30 DIAGNOSIS — C189 Malignant neoplasm of colon, unspecified: Secondary | ICD-10-CM | POA: Diagnosis present

## 2012-07-30 DIAGNOSIS — E872 Acidosis, unspecified: Secondary | ICD-10-CM | POA: Diagnosis present

## 2012-07-30 DIAGNOSIS — E876 Hypokalemia: Secondary | ICD-10-CM | POA: Diagnosis not present

## 2012-07-30 DIAGNOSIS — Z9221 Personal history of antineoplastic chemotherapy: Secondary | ICD-10-CM

## 2012-07-30 DIAGNOSIS — M1990 Unspecified osteoarthritis, unspecified site: Secondary | ICD-10-CM | POA: Diagnosis present

## 2012-07-30 DIAGNOSIS — R791 Abnormal coagulation profile: Secondary | ICD-10-CM | POA: Diagnosis present

## 2012-07-30 DIAGNOSIS — E875 Hyperkalemia: Secondary | ICD-10-CM | POA: Diagnosis not present

## 2012-07-30 DIAGNOSIS — I872 Venous insufficiency (chronic) (peripheral): Secondary | ICD-10-CM | POA: Diagnosis present

## 2012-07-30 DIAGNOSIS — K226 Gastro-esophageal laceration-hemorrhage syndrome: Secondary | ICD-10-CM | POA: Diagnosis present

## 2012-07-30 DIAGNOSIS — R6520 Severe sepsis without septic shock: Secondary | ICD-10-CM | POA: Diagnosis present

## 2012-07-30 DIAGNOSIS — Y849 Medical procedure, unspecified as the cause of abnormal reaction of the patient, or of later complication, without mention of misadventure at the time of the procedure: Secondary | ICD-10-CM | POA: Diagnosis present

## 2012-07-30 DIAGNOSIS — R5381 Other malaise: Secondary | ICD-10-CM | POA: Insufficient documentation

## 2012-07-30 DIAGNOSIS — Z79899 Other long term (current) drug therapy: Secondary | ICD-10-CM | POA: Insufficient documentation

## 2012-07-30 DIAGNOSIS — D703 Neutropenia due to infection: Secondary | ICD-10-CM | POA: Diagnosis present

## 2012-07-30 DIAGNOSIS — M519 Unspecified thoracic, thoracolumbar and lumbosacral intervertebral disc disorder: Secondary | ICD-10-CM | POA: Diagnosis present

## 2012-07-30 DIAGNOSIS — N183 Chronic kidney disease, stage 3 unspecified: Secondary | ICD-10-CM | POA: Diagnosis present

## 2012-07-30 DIAGNOSIS — M545 Low back pain, unspecified: Secondary | ICD-10-CM | POA: Insufficient documentation

## 2012-07-30 DIAGNOSIS — T80218A Other infection due to central venous catheter, initial encounter: Secondary | ICD-10-CM | POA: Diagnosis present

## 2012-07-30 DIAGNOSIS — Z872 Personal history of diseases of the skin and subcutaneous tissue: Secondary | ICD-10-CM | POA: Insufficient documentation

## 2012-07-30 DIAGNOSIS — M549 Dorsalgia, unspecified: Secondary | ICD-10-CM

## 2012-07-30 DIAGNOSIS — K6812 Psoas muscle abscess: Secondary | ICD-10-CM | POA: Diagnosis present

## 2012-07-30 DIAGNOSIS — Z8739 Personal history of other diseases of the musculoskeletal system and connective tissue: Secondary | ICD-10-CM | POA: Insufficient documentation

## 2012-07-30 DIAGNOSIS — R5081 Fever presenting with conditions classified elsewhere: Secondary | ICD-10-CM | POA: Diagnosis present

## 2012-07-30 DIAGNOSIS — B029 Zoster without complications: Secondary | ICD-10-CM | POA: Diagnosis present

## 2012-07-30 DIAGNOSIS — R21 Rash and other nonspecific skin eruption: Secondary | ICD-10-CM | POA: Insufficient documentation

## 2012-07-30 HISTORY — DX: Unspecified atrial fibrillation: I48.91

## 2012-07-30 MED ORDER — HYDROMORPHONE HCL PF 2 MG/ML IJ SOLN
2.0000 mg | Freq: Once | INTRAMUSCULAR | Status: AC
  Administered 2012-07-30: 2 mg via INTRAMUSCULAR
  Filled 2012-07-30: qty 1

## 2012-07-30 MED ORDER — HYDROMORPHONE HCL 4 MG PO TABS: 4.0000 mg | ORAL_TABLET | Freq: Four times a day (QID) | ORAL | Status: DC | PRN

## 2012-07-30 NOTE — ED Notes (Signed)
Pt pulse ox would drop to 89-90%, pt placed on oxygen at 2 lpm with increase in oxygen to 97%

## 2012-07-30 NOTE — ED Notes (Signed)
Pt presents to er with family with c/o lower back, bilateral leg pain, decreased white cell count, rash to right thigh area, pt was seen at oncology clinic on Friday, given hydrocodone, flexeril with no improvement in pain, pt was diagnosed with ?shingles vs infection on Friday, area with some improvement in rash., family also reports that pt';s white count was 1.1 on Friday.

## 2012-07-30 NOTE — ED Notes (Signed)
Dr Zammit at bedside,  

## 2012-07-30 NOTE — ED Provider Notes (Signed)
History    This chart was scribed for Alex Lennert, MD by Leone Payor, ED Scribe. This patient was seen in room APA07/APA07 and the patient's care was started 10:53 AM.  CSN: [id number] Arrival date & time 07/30/12  0959  None    Chief Complaint  Patient presents with  . Fatigue  . Back Pain  . Leg Pain    Patient is a 67 y.o. male presenting with back pain. The history is provided by the patient. No language interpreter was used.  Back Pain Location:  Lumbar spine Quality:  Unable to specify Radiates to:  Does not radiate Pain severity:  Moderate Pain is:  Same all the time Onset quality:  Gradual Duration:  2 days Timing:  Constant Progression:  Unchanged Chronicity:  Chronic Associated symptoms: no abdominal pain, no chest pain and no headaches     HPI Comments: Alex Petty is a 67 y.o. male who presents to the Emergency Department complaining of chronic back pain that worsened 2 days ago. Pt was seen at an oncology clinic on Friday 07/28/12 and was given a pain injection and prescribed pain medication and flexeril. States the medications have not helped him which brings him to the ED today. Pt was initially being seen for a rash to the right inner thigh area that was diagnosed as shingles. Per pt, the rash is improving and no longer burns as badly.   Past Medical History  Diagnosis Date  . Arthritis   . Hypertension   . Colon cancer     2005  . Chronic kidney disease   . DJD (degenerative joint disease)   . DJD (degenerative joint disease), ankle and foot   . Arthritis   . Cellulitis   . Arthritis   . Chronic back pain   . Gout   . Dysrhythmia     afibrillation  . 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 Atrial fibrillation    Past Surgical History  Procedure Laterality Date  . Foot surgery      right foot-  . Colon surgery    . Tonsillectomy    . Cystoscopy with urethral dilatation    . Back surgery      x2  . Cataract extraction  w/phaco  01/14/2011    Procedure: CATARACT EXTRACTION PHACO AND INTRAOCULAR LENS PLACEMENT (IOC);  Surgeon: Gemma Payor;  Location: [facility];  Service: Ophthalmology;  Laterality: Left;  CDE=16.27  . Lymph node biopsy Right 07/11/2012    Procedure: CERVICAL LYMPH NODE BIOPSY;  Surgeon: Marlane Hatcher, MD;  Location: [facility];  Service: General;  Laterality: Right;  end @ 1101  . Portacath placement Right 07/11/2012    Procedure: INSERTION PORT-A-CATH;  Surgeon: Marlane Hatcher, MD;  Location: [facility];  Service: General;  Laterality: Right;   Family History  Problem Relation Age of Onset  . Anesthesia problems Neg Hx   . Hypotension Neg Hx   . Malignant hyperthermia Neg Hx   . Pseudochol deficiency Neg Hx    History  Substance Use Topics  . Smoking status: Never Smoker   . Smokeless tobacco: Never Used  . Alcohol Use: No    Review of Systems  Constitutional: Negative for appetite change.  HENT: Negative for congestion, sinus pressure and ear discharge.   Eyes: Negative for discharge.  Respiratory: Negative for cough.   Cardiovascular: Negative for chest pain.  Gastrointestinal: Negative for abdominal pain and diarrhea.  Genitourinary: Negative for frequency and hematuria.  Musculoskeletal: Positive for back pain.  Skin: Positive for rash.  Neurological: Negative for seizures and headaches.  Psychiatric/Behavioral: Negative for hallucinations.    Allergies  Ace inhibitors; Rocephin; and Beta adrenergic blockers  Home Medications   Current Outpatient Rx  Name  Route  Sig  Dispense  Refill  . acetaminophen (TYLENOL) 325 MG tablet   Oral   Take by mouth. Take 2 (325mg  tabs) 1 hour prior to Rituxan treatment.         Marland Kitchen acyclovir (ZOVIRAX) 400 MG tablet   Oral   Take 1 tablet (400 mg total) by mouth 2 (two) times daily.   60 tablet   2   . allopurinol (ZYLOPRIM) 300 MG tablet   Oral   Take 1 tablet (300 mg total) by mouth daily.   300 tablet   2   . BOSWELLIA SERRATA  PO   Oral   Take 1 capsule by mouth daily. With Vitamin D3         . cyclobenzaprine (FLEXERIL) 10 MG tablet   Oral   Take 1 tablet (10 mg total) by mouth 3 (three) times daily as needed for muscle spasms.   30 tablet   0   . dexamethasone (DECADRON) 4 MG tablet      Starting the day after chemo, take 2 tabs in the am and 2 tabs in the pm for 2 days. Then Stop. Take with food.   24 tablet   1   . diltiazem (CARDIZEM CD) 120 MG 24 hr capsule   Oral   Take 120 mg by mouth every morning.          . diphenhydrAMINE (BENADRYL) 25 MG tablet   Oral   Take 50 mg by mouth. Take 2 (25mg ) tablets 1 hour prior to Rituxan treatment.         . doxazosin (CARDURA) 4 MG tablet   Oral   Take 4 mg by mouth every morning.          . febuxostat (ULORIC) 40 MG tablet   Oral   Take 40 mg by mouth every morning.          . fenofibrate 54 MG tablet   Oral   Take 54 mg by mouth every morning.          Marland Kitchen HYDROcodone-acetaminophen (NORCO) 7.5-325 MG per tablet   Oral   Take 1 tablet by mouth every 6 (six) hours as needed for pain.         Marland Kitchen torsemide (DEMADEX) 20 MG tablet   Oral   Take 20-40 mg by mouth daily. Take 2 tablets if fluid retention is severe          . valACYclovir (VALTREX) 1000 MG tablet   Oral   Take 1 tablet (1,000 mg total) by mouth 3 (three) times daily.   42 tablet   0   . vitamin C (ASCORBIC ACID) 500 MG tablet   Oral   Take 500 mg by mouth daily.         Marland Kitchen warfarin (COUMADIN) 5 MG tablet   Oral   Take 1 tablet (5 mg total) by mouth daily.   30 tablet   1   . Glucosamine-Chondroit-Vit C-Mn (GLUCOSAMINE 1500 COMPLEX PO)   Oral   Take 1 tablet by mouth every morning.          . indomethacin (INDOCIN) 50 MG capsule   Oral   Take 50 mg by mouth every 8 (  eight) hours as needed. For gout         . lidocaine-prilocaine (EMLA) cream      Apply a quarter size amount to port site 1 hour prior to chemo. Do not rub in. Cover with plastic.    30 g   3   . Multiple Vitamins-Minerals (MULTIVITAMINS THER. W/MINERALS) TABS   Oral   Take 1 tablet by mouth every morning.          . mupirocin ointment (BACTROBAN) 2 %   Topical   Apply topically 3 (three) times daily. To affected area   22 g   0   . ondansetron (ZOFRAN) 8 MG tablet      Starting the day after chemo, take 1 tab in the am and 1 tab in the pm for 2 days. Then may take 1 tab two times a day IF needed for nausea/vomiting.   30 tablet   2   . sulfamethoxazole-trimethoprim (BACTRIM DS,SEPTRA DS) 800-160 MG per tablet   Oral   Take 1 tablet by mouth every Monday, Wednesday, and Friday.   30 tablet   1    BP 127/53  Pulse 110  Temp(Src) 99.3 F (37.4 C)  Resp 24  SpO2 97% Physical Exam  Nursing note and vitals reviewed. Constitutional: He is oriented to person, place, and time. He appears well-developed.  HENT:  Head: Normocephalic.  Eyes: Conjunctivae are normal.  Neck: No tracheal deviation present.  Cardiovascular:  No murmur heard. Musculoskeletal: Normal range of motion. He exhibits tenderness.  Tenderness to lumbar spine.   Neurological: He is oriented to person, place, and time.  Skin: Skin is warm.  Rash to inner R thigh that appears to be healing.   Psychiatric: He has a normal mood and affect.    ED Course  Procedures (including critical care time)  DIAGNOSTIC STUDIES: Oxygen Saturation is 97% on RA, adequate by my interpretation.    COORDINATION OF CARE: 11:06 AM Discussed treatment plan with pt at bedside and pt agreed to plan.   11:52 AM Upon recheck, pt states the back pain has eased after receiving the dilaudid injection. He rates it as a 3-4/10 currently.   Labs Reviewed - No data to display No results found. No diagnosis found.  MDM   Exacerbation of chronic back pain    The chart was scribed for me under my direct supervision.  I personally performed the history, physical, and medical decision making and all  procedures in the evaluation of this patient.Alex Lennert, MD 07/30/12 1158

## 2012-07-31 ENCOUNTER — Inpatient Hospital Stay (HOSPITAL_COMMUNITY)
Admission: EM | Admit: 2012-07-31 | Discharge: 2012-08-17 | DRG: 871 | Disposition: A | Discharge status: Other Acute Inpatient Hospital | Payer: Medicare Other | Attending: Internal Medicine | Admitting: Internal Medicine

## 2012-07-31 ENCOUNTER — Encounter (HOSPITAL_COMMUNITY): Payer: Medicare Other

## 2012-07-31 ENCOUNTER — Emergency Department (HOSPITAL_COMMUNITY): Payer: Medicare Other

## 2012-07-31 ENCOUNTER — Encounter (HOSPITAL_COMMUNITY): Payer: Self-pay

## 2012-07-31 DIAGNOSIS — M4640 Discitis, unspecified, site unspecified: Secondary | ICD-10-CM

## 2012-07-31 DIAGNOSIS — Z113 Encounter for screening for infections with a predominantly sexual mode of transmission: Secondary | ICD-10-CM

## 2012-07-31 DIAGNOSIS — A4101 Sepsis due to Methicillin susceptible Staphylococcus aureus: Secondary | ICD-10-CM

## 2012-07-31 DIAGNOSIS — C9110 Chronic lymphocytic leukemia of B-cell type not having achieved remission: Secondary | ICD-10-CM

## 2012-07-31 DIAGNOSIS — R7881 Bacteremia: Secondary | ICD-10-CM

## 2012-07-31 DIAGNOSIS — N179 Acute kidney failure, unspecified: Secondary | ICD-10-CM

## 2012-07-31 DIAGNOSIS — M4646 Discitis, unspecified, lumbar region: Secondary | ICD-10-CM

## 2012-07-31 DIAGNOSIS — R5081 Fever presenting with conditions classified elsewhere: Secondary | ICD-10-CM | POA: Diagnosis present

## 2012-07-31 DIAGNOSIS — K6812 Psoas muscle abscess: Secondary | ICD-10-CM

## 2012-07-31 DIAGNOSIS — A419 Sepsis, unspecified organism: Secondary | ICD-10-CM

## 2012-07-31 DIAGNOSIS — R609 Edema, unspecified: Secondary | ICD-10-CM

## 2012-07-31 DIAGNOSIS — L0102 Bockhart's impetigo: Secondary | ICD-10-CM

## 2012-07-31 DIAGNOSIS — M009 Pyogenic arthritis, unspecified: Secondary | ICD-10-CM

## 2012-07-31 DIAGNOSIS — I4891 Unspecified atrial fibrillation: Secondary | ICD-10-CM

## 2012-07-31 DIAGNOSIS — D61810 Antineoplastic chemotherapy induced pancytopenia: Secondary | ICD-10-CM | POA: Diagnosis present

## 2012-07-31 DIAGNOSIS — T80219A Unspecified infection due to central venous catheter, initial encounter: Secondary | ICD-10-CM

## 2012-07-31 DIAGNOSIS — Z01810 Encounter for preprocedural cardiovascular examination: Secondary | ICD-10-CM

## 2012-07-31 DIAGNOSIS — M549 Dorsalgia, unspecified: Secondary | ICD-10-CM

## 2012-07-31 DIAGNOSIS — D709 Neutropenia, unspecified: Secondary | ICD-10-CM

## 2012-07-31 DIAGNOSIS — N183 Chronic kidney disease, stage 3 unspecified: Secondary | ICD-10-CM

## 2012-07-31 DIAGNOSIS — I8311 Varicose veins of right lower extremity with inflammation: Secondary | ICD-10-CM

## 2012-07-31 DIAGNOSIS — L03116 Cellulitis of left lower limb: Secondary | ICD-10-CM | POA: Diagnosis present

## 2012-07-31 DIAGNOSIS — I872 Venous insufficiency (chronic) (peripheral): Secondary | ICD-10-CM

## 2012-07-31 DIAGNOSIS — R6521 Severe sepsis with septic shock: Secondary | ICD-10-CM | POA: Diagnosis present

## 2012-07-31 DIAGNOSIS — N189 Chronic kidney disease, unspecified: Secondary | ICD-10-CM | POA: Diagnosis present

## 2012-07-31 DIAGNOSIS — T451X5A Adverse effect of antineoplastic and immunosuppressive drugs, initial encounter: Secondary | ICD-10-CM

## 2012-07-31 DIAGNOSIS — M1990 Unspecified osteoarthritis, unspecified site: Secondary | ICD-10-CM

## 2012-07-31 DIAGNOSIS — M109 Gout, unspecified: Secondary | ICD-10-CM

## 2012-07-31 DIAGNOSIS — I8312 Varicose veins of left lower extremity with inflammation: Secondary | ICD-10-CM

## 2012-07-31 HISTORY — DX: Charcot's joint, unspecified ankle and foot: M14.679

## 2012-07-31 LAB — COMPREHENSIVE METABOLIC PANEL
AST: 14 U/L (ref 0–37)
BUN: 62 mg/dL — ABNORMAL HIGH (ref 6–23)
CO2: 22 mEq/L (ref 19–32)
Calcium: 8.5 mg/dL (ref 8.4–10.5)
Creatinine, Ser: 3.06 mg/dL — ABNORMAL HIGH (ref 0.50–1.35)
GFR calc Af Amer: 23 mL/min — ABNORMAL LOW (ref >90)
GFR calc non Af Amer: 20 mL/min — ABNORMAL LOW (ref >90)

## 2012-07-31 LAB — URINALYSIS, ROUTINE W REFLEX MICROSCOPIC
Leukocytes, UA: NEGATIVE
Nitrite: NEGATIVE
Specific Gravity, Urine: 1.025 (ref 1.005–1.030)
Urobilinogen, UA: 0.2 mg/dL (ref 0.0–1.0)

## 2012-07-31 LAB — CBC WITH DIFFERENTIAL/PLATELET
Basophils Absolute: 0 10*3/uL (ref 0.0–0.1)
Eosinophils Absolute: 0 10*3/uL (ref 0.0–0.7)
Eosinophils Relative: 0 % (ref 0–5)
Lymphocytes Relative: 5 % — ABNORMAL LOW (ref 12–46)
Lymphs Abs: 0 10*3/uL — ABNORMAL LOW (ref 0.7–4.0)
Lymphs Abs: 0 10*3/uL — ABNORMAL LOW (ref 0.7–4.0)
MCH: 31.5 pg (ref 26.0–34.0)
MCV: 96.2 fL (ref 78.0–100.0)
Monocytes Relative: 5 % (ref 3–12)
Neutrophils Relative %: 90 % — ABNORMAL HIGH (ref 43–77)
Platelets: 70 10*3/uL — ABNORMAL LOW (ref 150–400)
Platelets: 67 10*3/uL — ABNORMAL LOW (ref 150–400)
RBC: 2.13 MIL/uL — ABNORMAL LOW (ref 4.22–5.81)
RDW: 18.1 % — ABNORMAL HIGH (ref 11.5–15.5)
RDW: 18.8 % — ABNORMAL HIGH (ref 11.5–15.5)
Smear Review: DECREASED
WBC: 0.4 10*3/uL — CL (ref 4.0–10.5)

## 2012-07-31 LAB — BASIC METABOLIC PANEL
BUN: 65 mg/dL — ABNORMAL HIGH (ref 6–23)
Calcium: 7.9 mg/dL — ABNORMAL LOW (ref 8.4–10.5)
GFR calc non Af Amer: 20 mL/min — ABNORMAL LOW (ref >90)
Glucose, Bld: 125 mg/dL — ABNORMAL HIGH (ref 70–99)
Potassium: 4.9 mEq/L (ref 3.5–5.1)

## 2012-07-31 LAB — PROTIME-INR
INR: 2.32 — ABNORMAL HIGH (ref 0.00–1.49)
Prothrombin Time: 24.7 seconds — ABNORMAL HIGH (ref 11.6–15.2)

## 2012-07-31 LAB — CBC
MCHC: 33.3 g/dL (ref 30.0–36.0)
Platelets: 74 10*3/uL — ABNORMAL LOW (ref 150–400)
RDW: 18.1 % — ABNORMAL HIGH (ref 11.5–15.5)

## 2012-07-31 LAB — CARBOXYHEMOGLOBIN
Carboxyhemoglobin: 2.8 % — ABNORMAL HIGH (ref 0.5–1.5)
O2 Saturation: 62.8 %

## 2012-07-31 LAB — ABO/RH: ABO/RH(D): A POS

## 2012-07-31 LAB — URINE MICROSCOPIC-ADD ON

## 2012-07-31 LAB — PROCALCITONIN: Procalcitonin: 7.39 ng/mL

## 2012-07-31 MED ORDER — SODIUM CHLORIDE 0.9 % IV SOLN
250.0000 mg | Freq: Four times a day (QID) | INTRAVENOUS | Status: DC
  Administered 2012-07-31 – 2012-08-01 (×3): 250 mg via INTRAVENOUS
  Filled 2012-07-31 (×6): qty 250

## 2012-07-31 MED ORDER — ACETAMINOPHEN 500 MG PO TABS
1000.0000 mg | ORAL_TABLET | Freq: Once | ORAL | Status: AC
  Administered 2012-07-31: 1000 mg via ORAL
  Filled 2012-07-31: qty 2

## 2012-07-31 MED ORDER — SODIUM CHLORIDE 0.9 % IV SOLN: 1000.0000 mL | INTRAVENOUS | Status: DC

## 2012-07-31 MED ORDER — NOREPINEPHRINE BITARTRATE 1 MG/ML IJ SOLN
2.0000 ug/min | INTRAVENOUS | Status: DC
  Filled 2012-07-31: qty 4

## 2012-07-31 MED ORDER — SODIUM CHLORIDE 0.9 % IV SOLN
1000.0000 mL | Freq: Once | INTRAVENOUS | Status: AC
  Administered 2012-07-31: 1000 mL via INTRAVENOUS

## 2012-07-31 MED ORDER — PANTOPRAZOLE SODIUM 40 MG PO TBEC
40.0000 mg | DELAYED_RELEASE_TABLET | Freq: Every day | ORAL | Status: DC
  Administered 2012-08-01 – 2012-08-06 (×4): 40 mg via ORAL
  Filled 2012-07-31 (×4): qty 1

## 2012-07-31 MED ORDER — NOREPINEPHRINE BITARTRATE 1 MG/ML IJ SOLN: 2.0000 ug/min | INTRAVENOUS | Status: DC

## 2012-07-31 MED ORDER — VANCOMYCIN HCL IN DEXTROSE 1-5 GM/200ML-% IV SOLN
1000.0000 mg | INTRAVENOUS | Status: AC
  Administered 2012-07-31: 1000 mg via INTRAVENOUS
  Filled 2012-07-31: qty 200

## 2012-07-31 MED ORDER — DEXTROSE 5 % IV SOLN
1000.0000 mg | INTRAVENOUS | Status: DC
  Administered 2012-07-31: 1000 mg via INTRAVENOUS
  Filled 2012-07-31 (×2): qty 20

## 2012-07-31 MED ORDER — VANCOMYCIN HCL 10 G IV SOLR: 2000.0000 mg | INTRAVENOUS | Status: DC

## 2012-07-31 MED ORDER — ACETAMINOPHEN 325 MG PO TABS: 650.0000 mg | ORAL_TABLET | Freq: Four times a day (QID) | ORAL | Status: DC | PRN

## 2012-07-31 MED ORDER — ONDANSETRON HCL 4 MG/2ML IJ SOLN: 4.0000 mg | Freq: Three times a day (TID) | INTRAMUSCULAR | Status: DC | PRN

## 2012-07-31 MED ORDER — SODIUM CHLORIDE 0.9 % IV SOLN
INTRAVENOUS | Status: DC
  Administered 2012-07-31: 21:00:00 via INTRAVENOUS

## 2012-07-31 MED ORDER — SODIUM CHLORIDE 0.9 % IV SOLN
500.0000 mg | Freq: Once | INTRAVENOUS | Status: AC
  Administered 2012-07-31: 500 mg via INTRAVENOUS
  Filled 2012-07-31: qty 500

## 2012-07-31 MED ORDER — ONDANSETRON HCL 4 MG/2ML IJ SOLN
4.0000 mg | Freq: Four times a day (QID) | INTRAMUSCULAR | Status: DC | PRN
  Administered 2012-08-01 – 2012-08-06 (×10): 4 mg via INTRAVENOUS
  Filled 2012-07-31 (×10): qty 2

## 2012-07-31 MED ORDER — SODIUM CHLORIDE 0.9 % IV SOLN
150.0000 mg | Freq: Every day | INTRAVENOUS | Status: DC
  Administered 2012-07-31 – 2012-08-01 (×2): 150 mg via INTRAVENOUS
  Filled 2012-07-31 (×2): qty 150

## 2012-07-31 MED ORDER — BIOTENE DRY MOUTH MT LIQD
15.0000 mL | Freq: Two times a day (BID) | OROMUCOSAL | Status: DC
  Administered 2012-08-01 – 2012-08-17 (×32): 15 mL via OROMUCOSAL

## 2012-07-31 MED ORDER — CHLORHEXIDINE GLUCONATE 0.12 % MT SOLN
15.0000 mL | Freq: Two times a day (BID) | OROMUCOSAL | Status: DC
  Administered 2012-07-31 – 2012-08-17 (×35): 15 mL via OROMUCOSAL
  Filled 2012-07-31 (×37): qty 15

## 2012-07-31 MED ORDER — FILGRASTIM 480 MCG/1.6ML IJ SOLN
480.0000 ug | Freq: Once | INTRAVENOUS | Status: AC
  Administered 2012-07-31: 480 ug via INTRAVENOUS
  Filled 2012-07-31: qty 1.6

## 2012-07-31 MED ORDER — INSULIN ASPART 100 UNIT/ML ~~LOC~~ SOLN
0.0000 [IU] | Freq: Three times a day (TID) | SUBCUTANEOUS | Status: DC
  Administered 2012-08-01 – 2012-08-02 (×3): 1 [IU] via SUBCUTANEOUS
  Administered 2012-08-04: 2 [IU] via SUBCUTANEOUS

## 2012-07-31 MED ORDER — VANCOMYCIN HCL IN DEXTROSE 1-5 GM/200ML-% IV SOLN
1000.0000 mg | Freq: Once | INTRAVENOUS | Status: AC
  Administered 2012-07-31: 1000 mg via INTRAVENOUS
  Filled 2012-07-31: qty 200

## 2012-07-31 NOTE — H&P (Signed)
PULMONARY  / CRITICAL CARE MEDICINE  Name: Alex Petty MRN: [id number] DOB: 1945/08/10    ADMISSION DATE:  07/31/2012  REFERRING MD :  [facility]  CHIEF COMPLAINT:  Hypotension  BRIEF PATIENT DESCRIPTION:  67 yo male with hx of CLL s/p chemotherapy (bendamustine/rituxan) 07/26/12 had progressive confusion, weakness, and fever 103F.  Found to have rash on Rt leg.  Presented to [facility], and transferred to [facility] for treatment of septic shock with neutropenia.  SIGNIFICANT EVENTS: 7/21 Transfer to [facility], PRBC transfusion  STUDIES:    LINES / TUBES: Rt IJ CVL 7/21 >>   CULTURES: Blood 7/21 >>  Urine 7/21 >>   ANTIBIOTICS: Vancomycin 7/21 >> Primaxin 7/21 >> Acyclovir 7/21 >> Micafungin 7/21 >>   HISTORY OF PRESENT ILLNESS:   67 yo male started on chemo for CLL.  He developed rash on right thigh and was concern for zoster.  He was started on zovirax as outpt.  He developed progressive weakness to the point he had trouble walking.  He was becoming more confused.  He had fever 103F.  Family brought him to ED.  He was found to be hypotensive, febrile, anemia, and neutropenic.  He had CVL placed, given IV fluid, transfuse 1 unit PRBC, and started on ABx.  He was transfer to [facility] for further assessment.  He had one episode of vomiting.  He denies headache, sore throat, sinus congestion, cough, sputum, chest pain, hemoptysis, abdominal pain, diarrhea, or dysuria.  His daughter works as a Engineer, civil (consulting), and assisted with providing medical history.  PAST MEDICAL HISTORY :  Past Medical History  Diagnosis Date  . Arthritis   . Hypertension   . DJD (degenerative joint disease)   . DJD (degenerative joint disease), ankle and foot   . Arthritis   . Cellulitis   . Arthritis   . Chronic back pain   . Gout   . Dysrhythmia     afibrillation  . Atrial fibrillation   . Chronic kidney disease   . Colon cancer     2005  . 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 Foot surgery      right foot-  . Colon surgery    . Tonsillectomy    . Cystoscopy with urethral dilatation    . Back surgery      x2  . Cataract extraction w/phaco  01/14/2011    Procedure: CATARACT EXTRACTION PHACO AND INTRAOCULAR LENS PLACEMENT (IOC);  Surgeon: Gemma Payor;  Location: [facility];  Service: Ophthalmology;  Laterality: Left;  CDE=16.27  . Lymph node biopsy Right 07/11/2012    Procedure: CERVICAL LYMPH NODE BIOPSY;  Surgeon: Marlane Hatcher, MD;  Location: [facility];  Service: General;  Laterality: Right;  end @ 1101  . Portacath placement Right 07/11/2012    Procedure: INSERTION PORT-A-CATH;  Surgeon: Marlane Hatcher, MD;  Location: [facility];  Service: General;  Laterality: Right;   Prior to Admission medications   Medication Sig Start Date End Date Taking? Authorizing Provider  acetaminophen (TYLENOL) 325 MG tablet Take by mouth. Take 2 (325mg  tabs) 1 hour prior to Rituxan treatment.   Yes Historical Provider, MD  acyclovir (ZOVIRAX) 400 MG tablet Take 1 tablet (400 mg total) by mouth 2 (two) times daily. 07/19/12  Yes Ellouise Newer, PA-C  allopurinol (ZYLOPRIM) 300 MG tablet Take 1 tablet (300 mg total) by mouth daily. 07/19/12  Yes Ellouise Newer, PA-C  BOSWELLIA  SERRATA PO Take 1 capsule by mouth daily. With Vitamin D3   Yes Historical Provider, MD  cyclobenzaprine (FLEXERIL) 10 MG tablet Take 1 tablet (10 mg total) by mouth 3 (three) times daily as needed for muscle spasms. 07/28/12  Yes Maurine Minister Kefalas, PA-C  dexamethasone (DECADRON) 4 MG tablet Starting the day after chemo, take 2 tabs in the am and 2 tabs in the pm for 2 days. Then Stop. Take with food. 07/19/12  Yes Maurine Minister Kefalas, PA-C  diltiazem (CARDIZEM CD) 120 MG 24 hr capsule Take 120 mg by mouth every morning.    Yes Historical Provider, MD  diphenhydrAMINE (BENADRYL) 25 MG tablet Take 50 mg by mouth. Take 2 (25mg ) tablets 1 hour prior to Rituxan treatment.   Yes Historical Provider, MD   doxazosin (CARDURA) 4 MG tablet Take 4 mg by mouth every morning.    Yes Historical Provider, MD  febuxostat (ULORIC) 40 MG tablet Take 40 mg by mouth every morning.    Yes Historical Provider, MD  fenofibrate 54 MG tablet Take 54 mg by mouth every morning.    Yes Historical Provider, MD  Glucosamine-Chondroit-Vit C-Mn (GLUCOSAMINE 1500 COMPLEX PO) Take 1 tablet by mouth every morning.    Yes Historical Provider, MD  HYDROcodone-acetaminophen (NORCO) 7.5-325 MG per tablet Take 1 tablet by mouth every 6 (six) hours as needed for pain.   Yes Historical Provider, MD  HYDROmorphone (DILAUDID) 4 MG tablet Take 1 tablet (4 mg total) by mouth every 6 (six) hours as needed for pain. 07/30/12  Yes [name], MD  indomethacin (INDOCIN) 50 MG capsule Take 50 mg by mouth every 8 (eight) hours as needed. For gout   Yes Historical Provider, MD  lidocaine-prilocaine (EMLA) cream Apply a quarter size amount to port site 1 hour prior to chemo. Do not rub in. Cover with plastic. 07/19/12  Yes Ellouise Newer, PA-C  Multiple Vitamins-Minerals (MULTIVITAMINS THER. W/MINERALS) TABS Take 1 tablet by mouth every morning.    Yes Historical Provider, MD  mupirocin ointment (BACTROBAN) 2 % Apply topically 3 (three) times daily. To affected area 07/28/12 08/14/12 Yes Maurine Minister Kefalas, PA-C  ondansetron (ZOFRAN) 8 MG tablet Starting the day after chemo, take 1 tab in the am and 1 tab in the pm for 2 days. Then may take 1 tab two times a day IF needed for nausea/vomiting. 07/19/12  Yes Ellouise Newer, PA-C  sulfamethoxazole-trimethoprim (BACTRIM DS,SEPTRA DS) 800-160 MG per tablet Take 1 tablet by mouth every Monday, Wednesday, and Friday. 07/19/12  Yes Maurine Minister Kefalas, PA-C  torsemide (DEMADEX) 20 MG tablet Take 20-40 mg by mouth daily. Take 2 tablets if fluid retention is severe    Yes Historical Provider, MD  valACYclovir (VALTREX) 1000 MG tablet Take 1 tablet (1,000 mg total) by mouth 3 (three) times daily. 07/28/12 08/14/12 Yes  Maurine Minister Kefalas, PA-C  vitamin C (ASCORBIC ACID) 500 MG tablet Take 500 mg by mouth daily.   Yes Historical Provider, MD  warfarin (COUMADIN) 5 MG tablet Take 1 tablet (5 mg total) by mouth daily. 07/19/12  Yes Jodelle Gross, NP   Allergies  Allergen Reactions  . Ace Inhibitors Other (See Comments)    cough  . Rocephin (Ceftriaxone Sodium) Hives  . Beta Adrenergic Blockers Rash    FAMILY HISTORY:  Family History  Problem Relation Age of Onset  . Anesthesia problems Neg Hx   . Hypotension Neg Hx   . Malignant hyperthermia Neg Hx   .  Pseudochol deficiency Neg Hx    SOCIAL HISTORY:  reports that he has never smoked. He has never used smokeless tobacco. He reports that he does not drink alcohol or use illicit drugs.  REVIEW OF SYSTEMS:   Negative except above.  SUBJECTIVE:   VITAL SIGNS: Temp:  [101.3 F (38.5 C)-101.7 F (38.7 C)] 101.3 F (38.5 C) (07/21 1638) Pulse Rate:  [105-120] 109 (07/21 1700) Resp:  [13-21] 13 (07/21 1700) BP: (63-107)/(42-70) 107/50 mmHg (07/21 1819) SpO2:  [91 %-98 %] 98 % (07/21 1700) Weight:  [326 lb (147.873 kg)-328 lb 7.8 oz (149 kg)] 328 lb 7.8 oz (149 kg) (07/21 1819) HEMODYNAMICS:   VENTILATOR SETTINGS:   INTAKE / OUTPUT: Intake/Output     07/20 0701 - 07/21 0700 07/21 0701 - 07/22 0700   I.V. (mL/kg)  400 (2.7)   Blood  350   Other  100   Total Intake(mL/kg)  850 (5.7)   Urine (mL/kg/hr)  300   Total Output   300   Net   +550          PHYSICAL EXAMINATION: General: Ill appearing Neuro: Alert, oriented to person, follows commands, moves extremities, CN intact HEENT:  No sinus tenderness, no oral lesions, no thyromegaly, no LAN Cardiovascular:  s1s2 irregular, no murmur   Lungs:  No wheeze/rales Abdomen:  Soft, decreased bowel sounds, no tender Musculoskeletal:  2+ non-pitting edema lower extremities b/l Skin:  Vesicular lesions Rt medial thigh, chronic lymphedema changes lower legs, erythema between 4th and 5th toe on  Rt  LABS: CBC Recent Labs     07/31/12  1056  07/31/12  1207  WBC  0.5*  0.5*  HGB  7.2*  6.7*  HCT  21.6*  20.5*  PLT  74*  70*    Coag's Recent Labs     07/31/12  1207  INR  2.32*    BMET Recent Labs     07/31/12  1207  NA  132*  K  5.1  CL  97  CO2  22  BUN  62*  CREATININE  3.06*  GLUCOSE  122*    Electrolytes Recent Labs     07/31/12  1207  CALCIUM  8.5    Sepsis Markers Recent Labs     07/31/12  1224  PROCALCITON  7.39    ABG No results found for this basename: PHART, PCO2ART, PO2ART,  in the last 72 hours  Liver Enzymes Recent Labs     07/31/12  1207  AST  14  ALT  10  ALKPHOS  35*  BILITOT  1.2  ALBUMIN  2.6*    Cardiac Enzymes No results found for this basename: TROPONINI, PROBNP,  in the last 72 hours  Glucose Recent Labs     07/31/12  1801  GLUCAP  128*    Imaging Dg Chest Portable 1 View  07/31/2012   *RADIOLOGY REPORT*  Clinical Data: Central line placement  PORTABLE CHEST - 1 VIEW  Comparison: Portable exam 1401 hours compared to 1214 hours  Findings: New right jugular central venous catheter with tip projecting over SVC. Right subclavian Port-A-Cath again identified. Enlargement of cardiac silhouette with pulmonary vascular congestion. Lungs grossly clear. No pleural effusion, pneumothorax or acute osseous findings. Lateral left costophrenic angle excluded.  IMPRESSION: Enlargement of cardiac silhouette with pulmonary vascular congestion. No pneumothorax following right jugular line placement.   Original Report Authenticated By: Ulyses Southward, M.D.   Dg Chest Port 1 View  07/31/2012   *RADIOLOGY  REPORT*  Clinical Data: Sepsis, hypotension, history colon cancer, CLL, hypertension, chronic kidney disease, atrial fibrillation  PORTABLE CHEST - 1 VIEW  Comparison: Portable exam 1214 hours compared to 07/11/2012  Findings: Right subclavian Port-A-Cath stable tip projecting over SVC. Enlargement of cardiac silhouette with pulmonary  vascular congestion. No gross infiltrate, pleural effusion or pneumothorax. Minimal peribronchial thickening. Pronounced left glenohumeral degenerative changes.  IMPRESSION: Enlargement of cardiac silhouette with pulmonary vascular congestion. No acute infiltrate.   Original Report Authenticated By: Ulyses Southward, M.D.       ASSESSMENT / PLAN:  PULMONARY A: No acute issues. P:   -monitor oxygenation  CARDIOVASCULAR A: Septic shock. Hx of HTN, A fib. P:  -pressors as needed for goal SBP > 90, MAP > 65 -continue IV fluids -f/u cortisol level -f/u lactic acid level  RENAL A:  Acute on chronic renal failure. Hyperkalemia. P:   -monitor renal fx, urine outpt electrolytes -optimize hemodynamics  GASTROINTESTINAL A:  Nausea/vomiting >> abdominal exam benign. Nutrition. P:   -clear liquid diet -prn zofran -protonix for SUP  HEMATOLOGIC A:  Pancytopenia 2nd to chemotherapy for CLL. Coagulopathy 2nd to chronic coumadin for A fib. P:  -neupogen x 1 on 7/21 -will need oncology follow up -f/u CBC -f/u INR >> hold coumadin for now  INFECTIOUS A:  Neutropenic fever with septic shock >> possible sources are Rt thigh vesicular lesions (bacterial vs viral), and right foot (bacterial versus fungal). P:   -continue vancomycin, primaxin -add IV acyclovir, micafungin  ENDOCRINE A: Hyperglycemia. P:   -SSI  NEUROLOGIC A:  Acute encephalopathy 2nd to sepsis. P:   -monitor clinically  Updated family at bedside about plan.  CC time 40 minutes.  Coralyn Helling, MD [facility] Pulmonary/Critical Care 07/31/2012, 7:22 PM Pager:  [phone] After 3pm call: [phone]

## 2012-07-31 NOTE — ED Notes (Signed)
CRITICAL VALUE ALERT  Critical value received:  WBC 0.5 and Hgb 6.7 Date of notification: 07/31/2012 Time of notification:  1241 Critical value read back:yes  Nurse who received alert:  LRT  MD notified (1st page):  Eber Hong Time of first page: 1241 MD notified (2nd page):  Time of second page:  Responding MD: Eber Hong  Time MD responded:  [phone]

## 2012-07-31 NOTE — Progress Notes (Addendum)
ANTIBIOTIC CONSULT NOTE - INITIAL  Pharmacy Consult for Micafungin and acyclovir, vancomycin and primaxin Indication: septic shock with neutropenia  Allergies  Allergen Reactions  . Ace Inhibitors Other (See Comments)    cough  . Rocephin (Ceftriaxone Sodium) Hives  . Beta Adrenergic Blockers Rash    Patient Measurements: Height: 4\' 11"  (149.9 cm) Weight: 328 lb 7.8 oz (149 kg) IBW/kg (Calculated) : 47.7   Vital Signs: Temp: 101.3 F (38.5 C) (07/21 1638) Temp src: Core (Comment) (07/21 1819) BP: 107/50 mmHg (07/21 1819) Pulse Rate: 109 (07/21 1700) Intake/Output from previous day:   Intake/Output from this shift:    Labs:  Recent Labs  07/31/12 1056 07/31/12 1207  WBC 0.5* 0.5*  HGB 7.2* 6.7*  PLT 74* 70*  CREATININE  --  3.06*   Estimated Creatinine Clearance: 29.2 ml/min (by C-G formula based on Cr of 3.06). No results found for this basename: VANCOTROUGH, VANCOPEAK, VANCORANDOM, GENTTROUGH, GENTPEAK, GENTRANDOM, TOBRATROUGH, TOBRAPEAK, TOBRARND, AMIKACINPEAK, AMIKACINTROU, AMIKACIN,  in the last 72 hours   Microbiology: No results found for this or any previous visit (from the past 720 hour(s)).  Medical History: Past Medical History  Diagnosis Date  . Arthritis   . Hypertension   . DJD (degenerative joint disease)   . DJD (degenerative joint disease), ankle and foot   . Arthritis   . Cellulitis   . Arthritis   . Chronic back pain   . Gout   . Dysrhythmia     afibrillation  . Atrial fibrillation   . Chronic kidney disease   . Colon cancer     2005  . CLL (chronic lymphocytic leukemia) 07/19/2012    Assessment: 67 y.o morbidly obese male admitted for septic shock with neutropenia.  Transferred from [facility] where he received Vancomycin 1gm and Primaxin 500mg  x 1 dose each in ED at [facility].Marland Kitchen He has a history of CLL s/p chemotherapy bendamustine/rituxan) 07/26/12 had progressive confusion, weakness, and fever 103F. Found to have rash on right leg. History of  CKD. Scr = 3.06 on admit. CrCl ~ 29 ml/min.   Goal of therapy: Appropriate dosage for renal function.  Treat infection   Plan:  1.  Micafungin 150 mg IV q24h 2. Acyclovir 1000 mg IV q24h  Noah Delaine, RPh Clinical Pharmacist Pager: [phone] 07/31/2012,7:03 PM  Addendum:  Vancomycin and Primaxin to continue.  Vancomycin 1g IV given @ 14: 15 and primaxin 500 mg IV given @ 15:03 at [facility] ED.  Patient has h/o CKD.  Scr = 3.06 on admit. CrCl ~ 29 ml/min.  Weight 149 kg.   Goal:  Vancomycin trough = 15-20 mcg/ml   Plan:  Primaxin 250mg  IV  q6h Give addition vancomycin 1 g IV tonight (for total of 2 g tonight) then 2 g IV q48h.  F/u renal function and adjust dose as needed.   Noah Delaine, RPh Clinical Pharmacist Pager: [phone] 07/31/2012,  8:36 PM

## 2012-07-31 NOTE — ED Provider Notes (Signed)
History  This chart was scribed for Vida Roller, MD, by Yevette Edwards, ED Scribe. This patient was seen in room APA07/APA07 and the patient's care was started at 11:57 AM  CSN: [id number] Arrival date & time 07/31/12  1117  First MD Initiated Contact with Patient 07/31/12 1142     Chief Complaint  Patient presents with  . Back Pain  . Fatigue  . Emesis    The history is provided by the patient, the spouse and a relative. No language interpreter was used.   HPI Comments: Alex Petty is a 67 y.o. male who presents to the Emergency Department complaining of gradual onset lethargy. The pt has also experienced pain to his lumbar back and emesis. He reports that his emesis began last night and has continued this morning. The pt's daughter reports that he has experienced confusion recently. The symptoms are severe, persistent, and nothing makes them better or worse.  Additionally, he has shingles which are currently crusted. He denies SOB, coughing, chills, dysuria, and hematochezia. 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 He also had a blood transfusion last week.   He has a h/o of A fib, diagnosed three weeks ago. He also has a chronic h/o of bilateral LLE swelling. He denies a h/o of DM. The pt does have a h/o of HTN, chronic back pain, and cellulitis. He denies both smoking and using alcohol.   Dr. [name] is his PCP.  Past Medical History  Diagnosis Date  . Arthritis   . Hypertension   . Colon cancer     2005  . Chronic kidney disease   . DJD (degenerative joint disease)   . DJD (degenerative joint disease), ankle and foot   . Arthritis   . Cellulitis   . Arthritis   . Chronic back pain   . Gout   . Dysrhythmia     afibrillation  . 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 Atrial fibrillation    Past Surgical History  Procedure Laterality Date  . Foot surgery      right foot-  . Colon surgery    . Tonsillectomy    . Cystoscopy with  urethral dilatation    . Back surgery      x2  . Cataract extraction w/phaco  01/14/2011    Procedure: CATARACT EXTRACTION PHACO AND INTRAOCULAR LENS PLACEMENT (IOC);  Surgeon: Gemma Payor;  Location: [facility];  Service: Ophthalmology;  Laterality: Left;  CDE=16.27  . Lymph node biopsy Right 07/11/2012    Procedure: CERVICAL LYMPH NODE BIOPSY;  Surgeon: Marlane Hatcher, MD;  Location: [facility];  Service: General;  Laterality: Right;  end @ 1101  . Portacath placement Right 07/11/2012    Procedure: INSERTION PORT-A-CATH;  Surgeon: Marlane Hatcher, MD;  Location: [facility];  Service: General;  Laterality: Right;   Family History  Problem Relation Age of Onset  . Anesthesia problems Neg Hx   . Hypotension Neg Hx   . Malignant hyperthermia Neg Hx   . Pseudochol deficiency Neg Hx    History  Substance Use Topics  . Smoking status: Never Smoker   . Smokeless tobacco: Never Used  . Alcohol Use: No    Review of Systems A complete 10 system review of systems was obtained, and all systems were negative except where indicated in the HPI and PE.   Allergies  Ace inhibitors; Rocephin; and Beta adrenergic blockers  Home Medications  Current Outpatient Rx  Name  Route  Sig  Dispense  Refill  . acetaminophen (TYLENOL) 325 MG tablet   Oral   Take by mouth. Take 2 (325mg  tabs) 1 hour prior to Rituxan treatment.         Marland Kitchen acyclovir (ZOVIRAX) 400 MG tablet   Oral   Take 1 tablet (400 mg total) by mouth 2 (two) times daily.   60 tablet   2   . allopurinol (ZYLOPRIM) 300 MG tablet   Oral   Take 1 tablet (300 mg total) by mouth daily.   300 tablet   2   . BOSWELLIA SERRATA PO   Oral   Take 1 capsule by mouth daily. With Vitamin D3         . cyclobenzaprine (FLEXERIL) 10 MG tablet   Oral   Take 1 tablet (10 mg total) by mouth 3 (three) times daily as needed for muscle spasms.   30 tablet   0   . dexamethasone (DECADRON) 4 MG tablet      Starting the day after chemo, take 2 tabs in  the am and 2 tabs in the pm for 2 days. Then Stop. Take with food.   24 tablet   1   . diltiazem (CARDIZEM CD) 120 MG 24 hr capsule   Oral   Take 120 mg by mouth every morning.          . diphenhydrAMINE (BENADRYL) 25 MG tablet   Oral   Take 50 mg by mouth. Take 2 (25mg ) tablets 1 hour prior to Rituxan treatment.         . doxazosin (CARDURA) 4 MG tablet   Oral   Take 4 mg by mouth every morning.          . febuxostat (ULORIC) 40 MG tablet   Oral   Take 40 mg by mouth every morning.          . fenofibrate 54 MG tablet   Oral   Take 54 mg by mouth every morning.          . Glucosamine-Chondroit-Vit C-Mn (GLUCOSAMINE 1500 COMPLEX PO)   Oral   Take 1 tablet by mouth every morning.          Marland Kitchen HYDROcodone-acetaminophen (NORCO) 7.5-325 MG per tablet   Oral   Take 1 tablet by mouth every 6 (six) hours as needed for pain.         Marland Kitchen HYDROmorphone (DILAUDID) 4 MG tablet   Oral   Take 1 tablet (4 mg total) by mouth every 6 (six) hours as needed for pain.   20 tablet   0   . indomethacin (INDOCIN) 50 MG capsule   Oral   Take 50 mg by mouth every 8 (eight) hours as needed. For gout         . lidocaine-prilocaine (EMLA) cream      Apply a quarter size amount to port site 1 hour prior to chemo. Do not rub in. Cover with plastic.   30 g   3   . Multiple Vitamins-Minerals (MULTIVITAMINS THER. W/MINERALS) TABS   Oral   Take 1 tablet by mouth every morning.          . mupirocin ointment (BACTROBAN) 2 %   Topical   Apply topically 3 (three) times daily. To affected area   22 g   0   . ondansetron (ZOFRAN) 8 MG tablet      Starting the day after chemo,  take 1 tab in the am and 1 tab in the pm for 2 days. Then may take 1 tab two times a day IF needed for nausea/vomiting.   30 tablet   2   . sulfamethoxazole-trimethoprim (BACTRIM DS,SEPTRA DS) 800-160 MG per tablet   Oral   Take 1 tablet by mouth every Monday, Wednesday, and Friday.   30 tablet   1   .  torsemide (DEMADEX) 20 MG tablet   Oral   Take 20-40 mg by mouth daily. Take 2 tablets if fluid retention is severe          . valACYclovir (VALTREX) 1000 MG tablet   Oral   Take 1 tablet (1,000 mg total) by mouth 3 (three) times daily.   42 tablet   0   . vitamin C (ASCORBIC ACID) 500 MG tablet   Oral   Take 500 mg by mouth daily.         Marland Kitchen warfarin (COUMADIN) 5 MG tablet   Oral   Take 1 tablet (5 mg total) by mouth daily.   30 tablet   1    BP 106/55  Pulse 113  Temp(Src) 101.7 F (38.7 C) (Rectal)  Resp 19  Ht 5\' 8"  (1.727 m)  Wt 326 lb (147.873 kg)  BMI 49.58 kg/m2  SpO2 97% Physical Exam  Nursing note and vitals reviewed. Constitutional: He appears well-developed and well-nourished. He appears distressed.  HENT:  Head: Normocephalic and atraumatic.  Mouth/Throat: No oropharyngeal exudate.  Dry mucus membranes.   Eyes: Conjunctivae and EOM are normal. Pupils are equal, round, and reactive to light. Right eye exhibits no discharge. Left eye exhibits no discharge. No scleral icterus.  Neck: Normal range of motion. Neck supple. No JVD present. No thyromegaly present.  Cardiovascular: Normal heart sounds and intact distal pulses.  Exam reveals no gallop and no friction rub.   No murmur heard. Tachycardic and irregular rhythm.   Pulmonary/Chest: Effort normal and breath sounds normal. No respiratory distress. He has no wheezes. He has no rales.  Lungs sound clear.   Abdominal: Soft. Bowel sounds are normal. He exhibits no distension and no mass. There is no tenderness.  Tympanic sounds to percussion acorss the upper abdomen.  Musculoskeletal: Normal range of motion. He exhibits edema. He exhibits no tenderness.  Diffuse bilateral edema/lymphadema.  Lymphadenopathy:    He has no cervical adenopathy.  Neurological: He is alert. Coordination normal.  Somnolent, but arouseble. Follows commands. No focal deficits.   Skin: Skin is warm and dry. No rash noted. No  erythema.  Psychiatric: He has a normal mood and affect. His behavior is normal.    ED Course  CENTRAL LINE Date/Time: 07/31/2012 2:15 PM Performed by: Eber Hong D Authorized by: Eber Hong D Consent: Verbal consent obtained. written consent obtained. Risks and benefits: risks, benefits and alternatives were discussed Consent given by: patient Patient understanding: patient states understanding of the procedure being performed Patient consent: the patient's understanding of the procedure matches consent given Procedure consent: procedure consent matches procedure scheduled Relevant documents: relevant documents present and verified Test results: test results available and properly labeled Site marked: the operative site was marked Imaging studies: imaging studies available Required items: required blood products, implants, devices, and special equipment available Patient identity confirmed: verbally with patient Time out: Immediately prior to procedure a "time out" was called to verify the correct patient, procedure, equipment, support staff and site/side marked as required. Indications: vascular access Anesthesia: local infiltration Local anesthetic:  lidocaine 1% without epinephrine Anesthetic total: 5 ml Patient sedated: no Preparation: skin prepped with ChloraPrep Skin prep agent dried: skin prep agent completely dried prior to procedure Sterile barriers: all five maximum sterile barriers used - cap, mask, sterile gown, sterile gloves, and large sterile sheet Hand hygiene: hand hygiene performed prior to central venous catheter insertion Location details: right internal jugular Patient position: Trendelenburg Catheter type: triple lumen Pre-procedure: landmarks identified Ultrasound guidance: yes Number of attempts: 2 Successful placement: yes Post-procedure: line sutured and dressing applied Assessment: blood return through all ports, free fluid flow, placement  verified by x-ray and no pneumothorax on x-ray Patient tolerance: Patient tolerated the procedure well with no immediate complications.   (including critical care time) DIAGNOSTIC STUDIES: Oxygen Saturation is 93% on room air, low by my interpretation.    COORDINATION OF CARE:  12:03 PM-Discussed treatment plan with patient includes IV fluids, imaging, blood work. The patient agreed to the plan.   Labs Reviewed  CBC WITH DIFFERENTIAL - Abnormal; Notable for the following:    WBC 0.5 (*)    RBC 2.13 (*)    Hemoglobin 6.7 (*)    HCT 20.5 (*)    RDW 18.1 (*)    Platelets 70 (*)    Neutrophils Relative % 89 (*)    Neutro Abs 0.4 (*)    Lymphocytes Relative 2 (*)    Lymphs Abs 0.0 (*)    Monocytes Absolute 0.0 (*)    All other components within normal limits  COMPREHENSIVE METABOLIC PANEL - Abnormal; Notable for the following:    Sodium 132 (*)    Glucose, Bld 122 (*)    BUN 62 (*)    Creatinine, Ser 3.06 (*)    Total Protein 5.5 (*)    Albumin 2.6 (*)    Alkaline Phosphatase 35 (*)    GFR calc non Af Amer 20 (*)    GFR calc Af Amer 23 (*)    All other components within normal limits  URINALYSIS, ROUTINE W REFLEX MICROSCOPIC - Abnormal; Notable for the following:    Protein, ur 30 (*)    All other components within normal limits  LACTIC ACID, PLASMA - Abnormal; Notable for the following:    Lactic Acid, Venous 3.3 (*)    All other components within normal limits  PROTIME-INR - Abnormal; Notable for the following:    Prothrombin Time 24.7 (*)    INR 2.32 (*)    All other components within normal limits  URINE MICROSCOPIC-ADD ON - Abnormal; Notable for the following:    Casts GRANULAR CAST (*)    All other components within normal limits  CULTURE, BLOOD (ROUTINE X 2)  CULTURE, BLOOD (ROUTINE X 2)  URINE CULTURE  PROCALCITONIN  CORTISOL  PREPARE RBC (CROSSMATCH)  TYPE AND SCREEN   Dg Chest Portable 1 View  07/31/2012   *RADIOLOGY REPORT*  Clinical Data: Central line  placement  PORTABLE CHEST - 1 VIEW  Comparison: Portable exam 1401 hours compared to 1214 hours  Findings: New right jugular central venous catheter with tip projecting over SVC. Right subclavian Port-A-Cath again identified. Enlargement of cardiac silhouette with pulmonary vascular congestion. Lungs grossly clear. No pleural effusion, pneumothorax or acute osseous findings. Lateral left costophrenic angle excluded.  IMPRESSION: Enlargement of cardiac silhouette with pulmonary vascular congestion. No pneumothorax following right jugular line placement.   Original Report Authenticated By: Ulyses Southward, M.D.   Dg Chest Port 1 View  07/31/2012   *RADIOLOGY REPORT*  Clinical Data:  Sepsis, hypotension, history colon cancer, CLL, hypertension, chronic kidney disease, atrial fibrillation  PORTABLE CHEST - 1 VIEW  Comparison: Portable exam 1214 hours compared to 07/11/2012  Findings: Right subclavian Port-A-Cath stable tip projecting over SVC. Enlargement of cardiac silhouette with pulmonary vascular congestion. No gross infiltrate, pleural effusion or pneumothorax. Minimal peribronchial thickening. Pronounced left glenohumeral degenerative changes.  IMPRESSION: Enlargement of cardiac silhouette with pulmonary vascular congestion. No acute infiltrate.   Original Report Authenticated By: Ulyses Southward, M.D.   1. Sepsis   2. Febrile neutropenia     MDM  The patient has been persistently hypotensive. He has a severe anemia, a lactic acidosis and an elevated pro-calcitonin.  He has a febrile neutropenia and appears critically ill. I have discussed with the patient and family members the indications for central line placement as well as intubation and they agreed to all of these interventions as needed. The patient is persistently tachycardic, he has been given IV fluid boluses, he has responded mildly today's boluses and his last blood pressure was 98 systolic. His mental status remains normal, he has been started on broad  spectrum antibiotics to cover febrile neutropenia.   I have consented the patient for a central line, this has been placed excessively and he is currently getting IV fluids through. I reviewed the x-ray both pre-central line and postcentral line, there is no signs of pulmonary infiltrates, no signs of pneumothorax and a central line appears to be in SVC.  And discussed with the critical care attending at [facility] in the intensive care unit Dr. Marchelle Gearing who agrees to admit.   The patient has been rechecked multiple times, he continues to have borderline but improved blood pressure after IV fluids. Levophed is being held at this time. He has also been receiving broad-spectrum antibiotics.  CRITICAL CARE Performed by: Vida Roller Total critical care time: 60 Critical care time was exclusive of separately billable procedures and treating other patients. Critical care was necessary to treat or prevent imminent or life-threatening deterioration. Critical care was time spent personally by me on the following activities: development of treatment plan with patient and/or surrogate as well as nursing, discussions with consultants, evaluation of patient's response to treatment, examination of patient, obtaining history from patient or surrogate, ordering and performing treatments and interventions, ordering and review of laboratory studies, ordering and review of radiographic studies, pulse oximetry and re-evaluation of patient's condition.  Meds given in ED:  Medications  0.9 %  sodium chloride infusion (1,000 mLs Intravenous New Bag/Given 07/31/12 1215)    Followed by  0.9 %  sodium chloride infusion (1,000 mLs Intravenous New Bag/Given 07/31/12 1330)    Followed by  0.9 %  sodium chloride infusion (not administered)  norepinephrine (LEVOPHED) 4 mg in dextrose 5 % 250 mL infusion (not administered)  vancomycin (VANCOCIN) IVPB 1000 mg/200 mL premix (1,000 mg Intravenous New Bag/Given  07/31/12 1415)  imipenem-cilastatin (PRIMAXIN) 500 mg in sodium chloride 0.9 % 100 mL IVPB (500 mg Intravenous New Bag/Given 07/31/12 1503)  acetaminophen (TYLENOL) tablet 1,000 mg (1,000 mg Oral Given 07/31/12 1528)      I personally performed the services described in this documentation, which was scribed in my presence. The recorded information has been reviewed and is accurate.      Vida Roller, MD 07/31/12 1537

## 2012-07-31 NOTE — ED Notes (Signed)
Report called to Maralyn Sago, RN on 2100 at [facility]

## 2012-07-31 NOTE — Progress Notes (Signed)
Pt. Admitted to 2109 by [facility]. Pt. Is alert and oriented x3, however at times answers questions incorrectly. Pressures are improving. Dr. Craige Cotta in to see patient and spoke with family, discussed plan to continue antibiotics and give fluids and continue to monitor the patient. They verbalized understanding. Contact information obtained.

## 2012-07-31 NOTE — Progress Notes (Signed)
Name: [name] MRN: [id number] DOB: Nov 30, 1945  ELECTRONIC ICU PHYSICIAN NOTE  Problem:  Anemia   Recent Labs Lab 07/31/12 1056 07/31/12 1207 07/31/12 2000  HGB 7.2* 6.7* 6.4*      Intervention:  Transfuse two  Units  now with goal > 9 since ? sepsis  [name] 07/31/2012, 9:02 PM

## 2012-07-31 NOTE — ED Notes (Signed)
Daughter reports pt diagnosed with leukemia and lymphoma.  Received first chemo treatment on July 10 and 11.  On July 15th, pt received 2 units of prbc.   Was evaluated here yesterday for c/o back pain and shingles.   Today pt still c/o back pain but is much weaker and lethargic.

## 2012-08-01 DIAGNOSIS — L0102 Bockhart's impetigo: Secondary | ICD-10-CM | POA: Diagnosis present

## 2012-08-01 DIAGNOSIS — T451X5A Adverse effect of antineoplastic and immunosuppressive drugs, initial encounter: Secondary | ICD-10-CM | POA: Diagnosis present

## 2012-08-01 DIAGNOSIS — M549 Dorsalgia, unspecified: Secondary | ICD-10-CM | POA: Diagnosis present

## 2012-08-01 DIAGNOSIS — A419 Sepsis, unspecified organism: Secondary | ICD-10-CM

## 2012-08-01 DIAGNOSIS — N189 Chronic kidney disease, unspecified: Secondary | ICD-10-CM | POA: Diagnosis present

## 2012-08-01 DIAGNOSIS — R7881 Bacteremia: Secondary | ICD-10-CM | POA: Diagnosis present

## 2012-08-01 LAB — COMPREHENSIVE METABOLIC PANEL
ALT: 10 U/L (ref 0–53)
AST: 11 U/L (ref 0–37)
Alkaline Phosphatase: 36 U/L — ABNORMAL LOW (ref 39–117)
CO2: 22 mEq/L (ref 19–32)
Chloride: 101 mEq/L (ref 96–112)
GFR calc non Af Amer: 22 mL/min — ABNORMAL LOW (ref >90)
Potassium: 4.7 mEq/L (ref 3.5–5.1)
Sodium: 135 mEq/L (ref 135–145)
Total Bilirubin: 1.2 mg/dL (ref 0.3–1.2)

## 2012-08-01 LAB — CORTISOL: Cortisol, Plasma: 49.6 ug/dL

## 2012-08-01 LAB — CBC
MCHC: 33 g/dL (ref 30.0–36.0)
MCV: 93.1 fL (ref 78.0–100.0)
Platelets: 65 10*3/uL — ABNORMAL LOW (ref 150–400)
RDW: 19.4 % — ABNORMAL HIGH (ref 11.5–15.5)
WBC: 0.6 10*3/uL — CL (ref 4.0–10.5)

## 2012-08-01 LAB — GLUCOSE, CAPILLARY: Glucose-Capillary: 107 mg/dL — ABNORMAL HIGH (ref 70–99)

## 2012-08-01 LAB — PROTIME-INR: INR: 2.28 — ABNORMAL HIGH (ref 0.00–1.49)

## 2012-08-01 MED ORDER — DEXTROSE 5 % IV SOLN
750.0000 mg | Freq: Two times a day (BID) | INTRAVENOUS | Status: DC
  Administered 2012-08-01: 750 mg via INTRAVENOUS
  Filled 2012-08-01 (×2): qty 15

## 2012-08-01 MED ORDER — VANCOMYCIN HCL 10 G IV SOLR
2000.0000 mg | INTRAVENOUS | Status: DC
  Administered 2012-08-01 – 2012-08-02 (×2): 2000 mg via INTRAVENOUS
  Filled 2012-08-01 (×4): qty 2000

## 2012-08-01 MED ORDER — HYDROCODONE-ACETAMINOPHEN 5-325 MG PO TABS
1.0000 | ORAL_TABLET | Freq: Four times a day (QID) | ORAL | Status: DC | PRN
  Administered 2012-08-01: 1 via ORAL
  Administered 2012-08-01 – 2012-08-02 (×2): 2 via ORAL
  Filled 2012-08-01 (×2): qty 2
  Filled 2012-08-01: qty 1

## 2012-08-01 MED ORDER — CEFAZOLIN SODIUM-DEXTROSE 2-3 GM-% IV SOLR
2.0000 g | Freq: Three times a day (TID) | INTRAVENOUS | Status: DC
  Administered 2012-08-01 – 2012-08-16 (×44): 2 g via INTRAVENOUS
  Filled 2012-08-01 (×54): qty 50

## 2012-08-01 MED ORDER — SODIUM CHLORIDE 0.9 % IJ SOLN
3.0000 mL | INTRAMUSCULAR | Status: AC | PRN
  Administered 2012-08-09: 3 mL

## 2012-08-01 MED ORDER — HEPARIN SOD (PORK) LOCK FLUSH 100 UNIT/ML IV SOLN
250.0000 [IU] | INTRAVENOUS | Status: DC | PRN
Start: 2012-08-01 — End: 2012-08-16
  Filled 2012-08-01: qty 3

## 2012-08-01 NOTE — Consult Note (Signed)
[facility]    Date of Admission:  07/31/2012   Day 2 vancomycin        Day 2 imipenem        Day 2 acyclovir        Day 2 micafungin       Reason for Consult: Bacteremia    Referring Physician: Dr. Cyril Mourning  Principal Problem:   Bacteremia Active Problems:   Cellulitis of leg, left   Pustular folliculitis   Acute on chronic kidney disease, stage 3   Venous stasis dermatitis   A-fib   CLL (chronic lymphocytic leukemia)   Septic shock   Neutropenic fever   Antineoplastic chemotherapy induced pancytopenia   Acute back pain   . acyclovir  750 mg Intravenous Q12H  . antiseptic oral rinse  15 mL Mouth Rinse q12n4p  . chlorhexidine  15 mL Mouth Rinse BID  . imipenem-cilastatin  250 mg Intravenous Q6H  . insulin aspart  0-9 Units Subcutaneous TID WC  . micafungin (MYCAMINE) IV  150 mg Intravenous Daily  . pantoprazole  40 mg Oral Q1200  . vancomycin  2,000 mg Intravenous Q24H    Recommendations: 1. Continue vancomycin 2. Add cefazolin pending susceptibility results of blood cultures 3. Discontinue imipenem, acyclovir and micafungin  4. Discontinue airborne precautions 5. Repeat blood cultures tomorrow  Assessment: He probably has staph bacteremia as the cause of his sepsis. I do not think he is likely to have shingles given the pustular appearance of his right groin rash from the very start. I will focus therapy on staph with IV vancomycin and cefazolin pending final blood culture results. He will need repeat blood cultures tomorrow and will probably need to have his Port-A-Cath removed. When he is more alert and I can get more history about his back pain I will determine if he needs imaging to look for the possibility of vertebral infection. When he is more stable I will make some recommendations about echocardiography for evaluation of his bacteremia.    HPI: Alex Petty is a 67 y.o. male who is currently undergoing  chemotherapy for chronic lymphocytic leukemia. He has a Port-A-Cath. Last week he appears to developed acute on chronic low back pain and a pustular rash in his right groin. He is seen in started on topical antibacterial therapy and also therapy for possible shingles. He was seen in the emergency department 2 days ago because of his back pain and admitted yesterday with increasing weakness and confusion. Two of 2 blood cultures from admission are already positive for gram-positive cocci in clusters. One was drawn through his Port-A-Cath and the other was drawn peripherally. He was started on broad empiric therapy for bacterial infection, shingles, and possible fungal infection. He is feeling better today. He can tell me that the right groin rash began as small pimples rather than vesicles. He has no history of shingles. It sounds like he has increased swelling and redness in his right lower leg but it is not painful.   Review of Systems: Review of systems not obtained due to patient factors.  Past Medical History  Diagnosis Date  . Arthritis   . Hypertension   . DJD (degenerative joint disease)   . DJD (degenerative joint disease), ankle and foot   . Arthritis   . Cellulitis   . Arthritis   . Chronic back pain   . Gout   . Dysrhythmia  afibrillation  . Atrial fibrillation   . Chronic kidney disease   . Colon cancer     2005  . CLL (chronic lymphocytic leukemia) 07/19/2012    History  Substance Use Topics  . Smoking status: Never Smoker   . Smokeless tobacco: Never Used  . Alcohol Use: No    Family History  Problem Relation Age of Onset  . Anesthesia problems Neg Hx   . Hypotension Neg Hx   . Malignant hyperthermia Neg Hx   . Pseudochol deficiency Neg Hx    Allergies  Allergen Reactions  . Ace Inhibitors Other (See Comments)    cough  . Rocephin (Ceftriaxone Sodium) Hives  . Beta Adrenergic Blockers Rash    OBJECTIVE: Blood pressure 117/70, pulse 121, temperature 99.9  F (37.7 C), temperature source Other (Comment), resp. rate 27, height 5' 8.5" (1.74 m), weight 149 kg (328 lb 7.8 oz), SpO2 100.00%. General: He is groggy. He can answer some brief questions with single word answers but rapidly falls back asleep and cannot provide a full history. No family are present. Skin: There is a maculopapular rash with pustules in his right groin. His chronic venous stasis dermatitis of both lower legs with acute redness and swelling of his right leg. He has some bruising over his right anterior chest near his Port-A-Cath Oral: No obvious oropharyngeal lesions. Dentures. Lungs: Clear Cor: Regular S1 and S2 no murmurs Abdomen: Obese, soft and nontender Neuro: He is groggy but his speech and thought content appears normal Joints and extremities: As noted above  Lab Results  Component Value Date   WBC 0.6* 08/01/2012   HGB 7.6* 08/01/2012   HCT 23.0* 08/01/2012   MCV 93.1 08/01/2012   PLT 65* 08/01/2012   BMET    Component Value Date/Time   NA 135 08/01/2012 0430   K 4.7 08/01/2012 0430   CL 101 08/01/2012 0430   CO2 22 08/01/2012 0430   GLUCOSE 112* 08/01/2012 0430   BUN 65* 08/01/2012 0430   CREATININE 2.82* 08/01/2012 0430   CALCIUM 8.1* 08/01/2012 0430   GFRNONAA 22* 08/01/2012 0430   GFRAA 25* 08/01/2012 0430   Lab Results  Component Value Date   ALT 10 08/01/2012   AST 11 08/01/2012   ALKPHOS 36* 08/01/2012   BILITOT 1.2 08/01/2012   Microbiology: Recent Results (from the past 240 hour(s))  CULTURE, BLOOD (ROUTINE X 2)     Status: None   Collection Time    07/31/12 12:23 PM      Result Value Range Status   Specimen Description BLOOD LEFT FOREARM   Final   Special Requests     Final   Value: BOTTLES DRAWN AEROBIC AND ANAEROBIC AEB=12CC ANA=8CC   Culture     Final   Value: GRAM POSITIVE COCCI IN CLUSTERS     Gram Stain Report Called to,Read Back By and Verified With: Tonye Becket (RN MICU AT [facility] AT 1115 ON 08/01/2012 BY BAUGHAM,M.     Performed at [facility]   Report Status PENDING   Incomplete  CULTURE, BLOOD (ROUTINE X 2)     Status: None   Collection Time    07/31/12 12:27 PM      Result Value Range Status   Specimen Description BLOOD PORTA CATH DRAWN BY RN LT   Final   Special Requests BOTTLES DRAWN AEROBIC AND ANAEROBIC 7CC   Final   Culture     Final   Value: GRAM POSITIVE COCCI IN CLUSTERS  Gram Stain Report Called to,Read Back By and Verified With: MASON,J. (RN MICU AT [facility]) AT 1115 ON 08/01/2012 BY BAUGHAM,M.     Performed at [facility]   Report Status PENDING   Incomplete  MRSA PCR SCREENING     Status: None   Collection Time    07/31/12  6:02 PM      Result Value Range Status   MRSA by PCR NEGATIVE  NEGATIVE Final   Comment:            The GeneXpert MRSA Assay (FDA     approved for NASAL specimens     only), is one component of a     comprehensive MRSA colonization     surveillance program. It is not     intended to diagnose MRSA     infection nor to guide or     monitor treatment for     MRSA infections.    Cliffton Asters, MD [facility] [phone] pager   [phone] cell 08/01/2012, 3:35 PM

## 2012-08-01 NOTE — Progress Notes (Signed)
ANTIBIOTIC CONSULT NOTE - INITIAL  Pharmacy Consult for Cefazolin Indication: probable staph bacteremia/ sepsis with neutropenia  Allergies  Allergen Reactions  . Ace Inhibitors Other (See Comments)    cough  . Rocephin (Ceftriaxone Sodium) Hives  . Beta Adrenergic Blockers Rash    Patient Measurements: Height: 5' 8.5" (174 cm) Weight: 328 lb 7.8 oz (149 kg) IBW/kg (Calculated) : 69.55   Vital Signs: Temp: 99.9 F (37.7 C) (07/22 1400) Temp src: Other (Comment) (07/22 1400) BP: 112/68 mmHg (07/22 1600) Pulse Rate: 122 (07/22 1600) Intake/Output from previous day: 07/21 0701 - 07/22 0700 In: 3037 [I.V.:2250; Blood:362; IV Piggyback:250] Out: 1500 [Urine:1500] Intake/Output from this shift: Total I/O In: 1525 [P.O.:810; I.V.:150; IV Piggyback:565] Out: 650 [Urine:650]  Labs:  Recent Labs  07/31/12 1207 07/31/12 2000 08/01/12 0430 08/01/12 1017  WBC 0.5* 0.4*  --  0.6*  HGB 6.7* 6.4*  --  7.6*  PLT 70* 67*  --  65*  CREATININE 3.06* 2.96* 2.82*  --    Estimated Creatinine Clearance: 36.5 ml/min (by C-G formula based on Cr of 2.82). No results found for this basename: VANCOTROUGH, Leodis Binet, VANCORANDOM, GENTTROUGH, GENTPEAK, GENTRANDOM, TOBRATROUGH, TOBRAPEAK, TOBRARND, AMIKACINPEAK, AMIKACINTROU, AMIKACIN,  in the last 72 hours   Microbiology: Recent Results (from the past 720 hour(s))  CULTURE, BLOOD (ROUTINE X 2)     Status: None   Collection Time    07/31/12 12:23 PM      Result Value Range Status   Specimen Description BLOOD LEFT FOREARM   Final   Special Requests     Final   Value: BOTTLES DRAWN AEROBIC AND ANAEROBIC AEB=12CC ANA=8CC   Culture     Final   Value: GRAM POSITIVE COCCI IN CLUSTERS     Gram Stain Report Called to,Read Back By and Verified With: Tonye Becket (RN MICU AT [facility]) AT 1115 ON 08/01/2012 BY BAUGHAM,M.     Performed at [facility]   Report Status PENDING   Incomplete  CULTURE, BLOOD (ROUTINE X 2)     Status: None   Collection  Time    07/31/12 12:27 PM      Result Value Range Status   Specimen Description BLOOD PORTA CATH DRAWN BY RN LT   Final   Special Requests BOTTLES DRAWN AEROBIC AND ANAEROBIC 7CC   Final   Culture     Final   Value: GRAM POSITIVE COCCI IN CLUSTERS     Gram Stain Report Called to,Read Back By and Verified With: MASON,J. (RN MICU AT [facility]) AT 1115 ON 08/01/2012 BY BAUGHAM,M.     Performed at [facility]   Report Status PENDING   Incomplete  MRSA PCR SCREENING     Status: None   Collection Time    07/31/12  6:02 PM      Result Value Range Status   MRSA by PCR NEGATIVE  NEGATIVE Final   Comment:            The GeneXpert MRSA Assay (FDA     approved for NASAL specimens     only), is one component of a     comprehensive MRSA colonization     surveillance program. It is not     intended to diagnose MRSA     infection nor to guide or     monitor treatment for     MRSA infections.    Medical History: Past Medical History  Diagnosis Date  . Arthritis   . Hypertension   . DJD (  degenerative joint disease)   . DJD (degenerative joint disease), ankle and foot   . Arthritis   . Cellulitis   . Arthritis   . Chronic back pain   . Gout   . Dysrhythmia     afibrillation  . Atrial fibrillation   . Chronic kidney disease   . Colon cancer     2005  . 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 He is currently undergoing chemotherapy for chronic lymphocytic leukemia. He has a Port-A-Cath.  CKD. Scr = 2.82, improved; CrCl ~ 36 ml/min.  Weight =149 kg  Goal of therapy: Appropriate dosage for renal function.  Treat infection   Plan:  Cefazolin 2 g IV q8h  Noah Delaine, RPh Clinical Pharmacist Pager: [phone] 08/01/2012,4:11 PM

## 2012-08-01 NOTE — Progress Notes (Signed)
Called Dr. Sherene Sires and made aware of pts. WBC that was called critical result per lab. Clarified order about u how many units of blood to transfuse. With order to transfuse only one unit.

## 2012-08-01 NOTE — Progress Notes (Signed)
ANTIBIOTIC CONSULT NOTE - INITIAL  Pharmacy Consult for Micafungin and acyclovir, vancomycin and primaxin Indication: septic shock with neutropenia  Allergies  Allergen Reactions  . Ace Inhibitors Other (See Comments)    cough  . Rocephin (Ceftriaxone Sodium) Hives  . Beta Adrenergic Blockers Rash    Patient Measurements: Height: 5' 8.5" (174 cm) Weight: 328 lb 7.8 oz (149 kg) IBW/kg (Calculated) : 69.55   Vital Signs: Temp: 99.5 F (37.5 C) (07/22 1000) BP: 109/66 mmHg (07/22 1000) Pulse Rate: 116 (07/22 1000) Intake/Output from previous day: 07/21 0701 - 07/22 0700 In: 3037 [I.V.:2250; Blood:362; IV Piggyback:250] Out: 1500 [Urine:1500] Intake/Output from this shift: Total I/O In: 150 [I.V.:150] Out: 175 [Urine:175]  Labs:  Recent Labs  07/31/12 1207 07/31/12 2000 08/01/12 0430 08/01/12 1017  WBC 0.5* 0.4*  --  0.6*  HGB 6.7* 6.4*  --  7.6*  PLT 70* 67*  --  65*  CREATININE 3.06* 2.96* 2.82*  --    Estimated Creatinine Clearance: 36.5 ml/min (by C-G formula based on Cr of 2.82). No results found for this basename: VANCOTROUGH, Leodis Binet, VANCORANDOM, GENTTROUGH, GENTPEAK, GENTRANDOM, TOBRATROUGH, TOBRAPEAK, TOBRARND, AMIKACINPEAK, AMIKACINTROU, AMIKACIN,  in the last 72 hours   Microbiology: Recent Results (from the past 720 hour(s))  MRSA PCR SCREENING     Status: None   Collection Time    07/31/12  6:02 PM      Result Value Range Status   MRSA by PCR NEGATIVE  NEGATIVE Final   Comment:            The GeneXpert MRSA Assay (FDA     approved for NASAL specimens     only), is one component of a     comprehensive MRSA colonization     surveillance program. It is not     intended to diagnose MRSA     infection nor to guide or     monitor treatment for     MRSA infections.    Medical History: Past Medical History  Diagnosis Date  . Arthritis   . Hypertension   . DJD (degenerative joint disease)   . DJD (degenerative joint disease), ankle and foot    . Arthritis   . Cellulitis   . Arthritis   . Chronic back pain   . Gout   . Dysrhythmia     afibrillation  . Atrial fibrillation   . Chronic kidney disease   . Colon cancer     2005  . 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 Patient on Day 2 of broad spectrum ABX for neutropenic fever/sepsis. The source is possibly R thigh vesicular lesions/R foot. PCT 6. LA 3.3->0.7. ANC 400. Wbc 0.6. Tm 101.7. ID consulted.  Bldx2 7/21 >>  [facility] called and reported GPC in blood.  Urine 7/21 >>   Vanc 7/21 >> Primaxin 7/21 >> Acyclovir 7/21 >> Micafungin 7/21 >   Goal of therapy: Appropriate dosage for renal function.  Treat infection (VT 15-20)  Plan:  1. Change acyclovir to 750 mg IV q12h 2. Change vancomycin to 2000mg  q24h 3. Micafungin 150 mg IV q24h 4. Primaxin 250 mg q6h 5. Monitor renal function and Cx

## 2012-08-01 NOTE — Significant Event (Signed)
CRITICAL VALUE ALERT  Critical value received:  Gram positive Cocci clusters     Date of notification:  08/01/2012  Time of notification:  1115  Critical value read back:yes  Nurse who received alert:  Juliann Pares RN  MD notified (1st page):  Dr Adriana Simas (face to face)  Time of first page:  1116  MD notified (2nd page):  Time of second page:  Responding MD:  Dr. Adriana Simas  Time MD responded:  1116

## 2012-08-01 NOTE — Plan of Care (Signed)
Problem: Phase I Progression Outcomes Goal: OOB as tolerated unless otherwise ordered Outcome: Progressing PT consult obtained

## 2012-08-01 NOTE — Progress Notes (Signed)
PULMONARY  / CRITICAL CARE MEDICINE  Name: Alex Petty MRN: [id number] DOB: [birth year]    ADMISSION DATE:  07/31/2012  REFERRING MD :  [facility]  CHIEF COMPLAINT:  Hypotension  BRIEF PATIENT DESCRIPTION:  67 yo male with hx of CLL s/p chemotherapy (bendamustine/rituxan) 07/26/12 had progressive confusion, weakness, and fever 103F.  Found to have rash on Rt leg.  Presented to [facility] ER, and transferred to [facility] for treatment of septic shock with neutropenia.  SIGNIFICANT EVENTS: 7/21 Transfer to [facility], PRBC transfusion  STUDIES:    LINES / TUBES: Rt IJ CVL 7/21 >>   CULTURES: Blood 7/21 >>  Urine 7/21 >>   ANTIBIOTICS: Vancomycin 7/21 >> Primaxin 7/21 >> Acyclovir 7/21 >> Micafungin 7/21 >>   HISTORY OF PRESENT ILLNESS:   67 yo male started on chemo for CLL.  He developed rash on right thigh and was concern for zoster.  He was started on zovirax as outpt.  He developed progressive weakness to the point he had trouble walking.  He was becoming more confused.  He had fever 103F.  Family brought him to ED.  He was found to be hypotensive, febrile, anemia, and neutropenic.  He had CVL placed, given IV fluid, transfuse 1 unit PRBC, and started on ABx.  He was transfer to [facility] for further assessment.  He had one episode of vomiting.  He denies headache, sore throat, sinus congestion, cough, sputum, chest pain, hemoptysis, abdominal pain, diarrhea, or dysuria.  His daughter works as a Engineer, civil (consulting), and assisted with providing medical history.   SUBJECTIVE:  Sleepy but easliy aroused & conversant Denies CP, breathing ok  VITAL SIGNS: Temp:  [99.3 F (37.4 C)-101.7 F (38.7 C)] 99.6 F (37.6 C) (07/22 1200) Pulse Rate:  [99-125] 120 (07/22 1100) Resp:  [12-21] 15 (07/22 1200) BP: (63-113)/(50-81) 112/76 mmHg (07/22 1200) SpO2:  [92 %-100 %] 92 % (07/22 1100) Weight:  [149 kg (328 lb 7.8 oz)] 149 kg (328 lb 7.8 oz) (07/21 1819) HEMODYNAMICS: CVP:  [8 mmHg-19 mmHg] 19 mmHg VENTILATOR  SETTINGS:   INTAKE / OUTPUT: Intake/Output     07/21 0701 - 07/22 0700 07/22 0701 - 07/23 0700   P.O.  360   I.V. (mL/kg) 2250 (15.1) 150 (1)   Blood 362    Other 175    IV Piggyback 250 565   Total Intake(mL/kg) 3037 (20.4) 1075 (7.2)   Urine (mL/kg/hr) 1500 425 (0.4)   Total Output 1500 425   Net +1537 +650          PHYSICAL EXAMINATION: General: Ill appearing Neuro: Alert, oriented x3 , follows commands, moves extremities, CN intact HEENT:  No sinus tenderness, no oral lesions, no thyromegaly, no LAN Cardiovascular:  s1s2 irregular, no murmur   Lungs:  No wheeze/rales Abdomen:  Soft, decreased bowel sounds, no tender Musculoskeletal:  2+ non-pitting edema lower extremities b/l Skin:  Vesicular lesions Rt medial thigh, chronic lymphedema changes lower legs, erythema between 4th and 5th toe on Rt  LABS: CBC Recent Labs     07/31/12  1207  07/31/12  2000  08/01/12  1017  WBC  0.5*  0.4*  0.6*  HGB  6.7*  6.4*  7.6*  HCT  20.5*  19.4*  23.0*  PLT  70*  67*  65*    Coag's Recent Labs     07/31/12  1207  08/01/12  0430  INR  2.32*  2.28*    BMET Recent Labs     07/31/12  1207  07/31/12  2000  08/01/12  0430  NA  132*  132*  135  K  5.1  4.9  4.7  CL  97  100  101  CO2  22  23  22   BUN  62*  65*  65*  CREATININE  3.06*  2.96*  2.82*  GLUCOSE  122*  125*  112*    Electrolytes Recent Labs     07/31/12  1207  07/31/12  2000  08/01/12  0430  CALCIUM  8.5  7.9*  8.1*    Sepsis Markers Recent Labs     07/31/12  1224  08/01/12  0430  PROCALCITON  7.39  6.19    ABG No results found for this basename: PHART, PCO2ART, PO2ART,  in the last 72 hours  Liver Enzymes Recent Labs     07/31/12  1207  08/01/12  0430  AST  14  11  ALT  10  10  ALKPHOS  35*  36*  BILITOT  1.2  1.2  ALBUMIN  2.6*  2.3*    Cardiac Enzymes No results found for this basename: TROPONINI, PROBNP,  in the last 72 hours  Glucose Recent Labs     07/31/12  1801   07/31/12  2034 08/01/12  08/01/12  0429  08/01/12  0916  08/01/12  1245  GLUCAP  128*  133*  128*  120*  107*  100*    Imaging Dg Chest Portable 1 View  07/31/2012   *RADIOLOGY REPORT*  Clinical Data: Central line placement  PORTABLE CHEST - 1 VIEW  Comparison: Portable exam 1401 hours compared to 1214 hours  Findings: New right jugular central venous catheter with tip projecting over SVC. Right subclavian Port-A-Cath again identified. Enlargement of cardiac silhouette with pulmonary vascular congestion. Lungs grossly clear. No pleural effusion, pneumothorax or acute osseous findings. Lateral left costophrenic angle excluded.  IMPRESSION: Enlargement of cardiac silhouette with pulmonary vascular congestion. No pneumothorax following right jugular line placement.   Original Report Authenticated By: Ulyses Southward, M.D.   Dg Chest Port 1 View  07/31/2012   *RADIOLOGY REPORT*  Clinical Data: Sepsis, hypotension, history colon cancer, CLL, hypertension, chronic kidney disease, atrial fibrillation  PORTABLE CHEST - 1 VIEW  Comparison: Portable exam 1214 hours compared to 07/11/2012  Findings: Right subclavian Port-A-Cath stable tip projecting over SVC. Enlargement of cardiac silhouette with pulmonary vascular congestion. No gross infiltrate, pleural effusion or pneumothorax. Minimal peribronchial thickening. Pronounced left glenohumeral degenerative changes.  IMPRESSION: Enlargement of cardiac silhouette with pulmonary vascular congestion. No acute infiltrate.   Original Report Authenticated By: Ulyses Southward, M.D.       ASSESSMENT / PLAN:  PULMONARY A: No acute issues. P:   -monitor oxygenation  CARDIOVASCULAR A: Septic shock.- did not require pressors, lactate cleared Hx of HTN, A fib. P:  -continue IV fluids   RENAL A:  Acute on chronic renal failure baseline cr 2.5 . Hyperkalemia -resolved P:   -monitor renal fx, urine outpt electrolytes  GASTROINTESTINAL A:  Nausea/vomiting >>  abdominal exam benign. Nutrition. P:   -advancediet -prn zofran -protonix for SUP  HEMATOLOGIC A:  Pancytopenia 2nd to chemotherapy for CLL. Coagulopathy 2nd to chronic coumadin for A fib. P:  -neupogen x 1 on 7/21 -will need oncology follow up - hold coumadin for now -resume coumadin once INR 2.0 or lower  INFECTIOUS A:  Neutropenic fever with septic shock >> possible sources are Rt thigh vesicular lesions (bacterial vs viral), and right foot (bacterial  versus fungal). P:   -continue vancomycin, primaxin -can prob stop  IV acyclovir, micafungin - defer to ID consult  ENDOCRINE A: Hyperglycemia. P:   -SSI  NEUROLOGIC A:  Acute encephalopathy 2nd to sepsis. P:   -resolving  Updated family at bedside about plan.   Care during the described time interval was provided by me and/or other providers on the critical care team.  I have reviewed this patient's available data, including medical history, events of note, physical examination and test results as part of my evaluation  CC time x  32m  Pailyn Bellevue V.  08/01/2012, 1:47 PM

## 2012-08-01 NOTE — Significant Event (Signed)
Patient transferred to 2612 on transport monitor.  VS remained stable for transport, family aware of transfer.  Patient lethargic, awakens to verbal stimuli. Arjay 3L Report given to [facility] receiving RN.  Bedrails left up X 2, call bell placed within reach.

## 2012-08-02 ENCOUNTER — Other Ambulatory Visit (HOSPITAL_COMMUNITY): Payer: Medicare Other

## 2012-08-02 ENCOUNTER — Ambulatory Visit (HOSPITAL_COMMUNITY): Payer: Medicare Other | Admitting: Oncology

## 2012-08-02 LAB — BASIC METABOLIC PANEL
Calcium: 8.3 mg/dL — ABNORMAL LOW (ref 8.4–10.5)
Chloride: 104 mEq/L (ref 96–112)
Creatinine, Ser: 2.27 mg/dL — ABNORMAL HIGH (ref 0.50–1.35)
GFR calc Af Amer: 33 mL/min — ABNORMAL LOW (ref >90)
GFR calc non Af Amer: 28 mL/min — ABNORMAL LOW (ref >90)

## 2012-08-02 LAB — PROTIME-INR: Prothrombin Time: 30.2 seconds — ABNORMAL HIGH (ref 11.6–15.2)

## 2012-08-02 LAB — CBC
MCH: 30.2 pg (ref 26.0–34.0)
MCHC: 32.3 g/dL (ref 30.0–36.0)
MCV: 93.3 fL (ref 78.0–100.0)
Platelets: 80 10*3/uL — ABNORMAL LOW (ref 150–400)
RDW: 19.2 % — ABNORMAL HIGH (ref 11.5–15.5)
WBC: 0.6 10*3/uL — CL (ref 4.0–10.5)

## 2012-08-02 LAB — URINE CULTURE

## 2012-08-02 LAB — GLUCOSE, CAPILLARY
Glucose-Capillary: 138 mg/dL — ABNORMAL HIGH (ref 70–99)
Glucose-Capillary: 113 mg/dL — ABNORMAL HIGH (ref 70–99)

## 2012-08-02 MED ORDER — OXYCODONE HCL 5 MG PO TABS
5.0000 mg | ORAL_TABLET | ORAL | Status: DC | PRN
  Administered 2012-08-06: 5 mg via ORAL
  Filled 2012-08-02: qty 1

## 2012-08-02 MED ORDER — SODIUM CHLORIDE 0.9 % IV SOLN
INTRAVENOUS | Status: DC
  Administered 2012-08-02 – 2012-08-07 (×9): via INTRAVENOUS
  Administered 2012-08-08: 1000 mL via INTRAVENOUS

## 2012-08-02 NOTE — Progress Notes (Signed)
PT Cancellation Note  Patient Details Name: Alex Petty MRN: [id number] DOB: 12-31-1945   Cancelled Treatment:    Reason Eval/Treat Not Completed: Patient not medically ready. RN deferred this date and reports they sat him up EOB for breakfast and it took him 2 hours to recovery. PT to re-attempt as able.   Marcene Brawn 08/02/2012, 12:14 PM

## 2012-08-02 NOTE — Progress Notes (Signed)
PT is refusing his CPAP. He states that he does not wear one at home and does not want to wear it here

## 2012-08-02 NOTE — Progress Notes (Signed)
[facility] Progress Note [place] TEAM 1 - Stepdown/ICU TEAM   Alex Petty RUE:[id number] DOB: 09-25-1945 DOA: 07/31/2012 PCP: [name], MD  Brief narrative: 67 yo male started on chemo for CLL (bendamustine/rituxan). He developed rash on right thigh originally thought to be zoster. He was started on zovirax as outpt. He developed progressive weakness to the point he had trouble walking. He was becoming more confused. He had fever to 103F. [facility] took [facility] to [facility] ED. He was found to be hypotensive, febrile, anemic, and neutropenic. He had CVL placed, was given IV fluid, transfused 1 unit PRBC, and started on ABx. He was transfered to [facility] for further assessment.   SIGNIFICANT EVENTS:  7/21 Transfer to [facility], PRBC transfusion   Assessment/Plan:  Neutropenic fever with Septic shock - Staph aureus bacteremia (2/2 cultures +) did not require pressors, lactate cleared - possible sources are Rt thigh lesions and right foot - confirmed to be staph on 7/23 - may require removal of Port-A-Cath - procalcitonin is trending downward - ID directing abx and extent of further w/u required   Pustular folliculitis Cont abx coverage - improving on exam  Venous stasis dermatitis  Cellulitis of leg, left Appears to have resolved w/ abx tx  Pancytopenia 2nd to chemotherapy for CLL neupogen x 1 on 7/21 - Hgb is holding steady - WBC and plts also stable   Chronic Afib Rate is poorly controlled, likely reflective of hypovolemia - gently hydrate and follow   Coagulopathy 2nd to chronic coumadin for A fib INR remains elevated - holding coumadin tx at this time  Hyperglycemia Likely stress response - check A1c  Acute on chronic renal failure baseline cr 2.5 - crt is currently at his baseline   Hyperkalemia resolved  Acute toxic metabolic encephalopathy  2nd to sepsis - waxing and waning - follow clinically - check B12 and folate  Hx of HTN BP well controlled this time/borderline  hypotensive - volume resuscitate and follow   Code Status: FULL Family Communication: spoke w/ daughter at bedside Disposition Plan: SDU  Consultants: PCCM >> TRH ID  Procedures: Rt IJ CVL 7/21 >>   Antibiotics: Vancomycin 7/21 >>  Cefazolin 7/22 >> Primaxin 7/21 >> 7/22 Acyclovir 7/21 >> 7/22 Micafungin 7/21 >> 7/22   DVT prophylaxis: warfarin  HPI/Subjective: Pt is lethargic but can be awakened.  Denies focal pain at this time.  Quickly falls asleep during interview before answering most questions.  Objective: Blood pressure 113/70, pulse 122, temperature 98.5 F (36.9 C), temperature source Oral, resp. rate 17, height 5' 8.5" (1.74 m), weight 149 kg (328 lb 7.8 oz), SpO2 99.00%.  Intake/Output Summary (Last 24 hours) at 08/02/12 1047 Last data filed at 08/02/12 0800  Gross per 24 hour  Intake   1585 ml  Output   2500 ml  Net   -915 ml    Exam: General: No acute respiratory distress at rest in bed Lungs: Clear to auscultation bilaterally without wheezes or crackles - BS are distant Cardiovascular: tachycardic but regular - no appreciable gallup or M but HS are quite distant Abdomen: morbidly obese, nontende, soft, bowel sounds positive, no rebound, no ascites, no appreciable mass Extremities: change of chronic venous stasis B - 1+ edema B LE   Data Reviewed: Basic Metabolic Panel:  Recent Labs Lab 07/28/12 1334 07/31/12 1207 07/31/12 2000 08/01/12 0430 08/02/12 0500  NA 133* 132* 132* 135 136  K 5.2* 5.1 4.9 4.7 4.5  CL 98 97 100 101 104  CO2 25 22 23 22 22   GLUCOSE 106* 122* 125* 112* 126*  BUN 54* 62* 65* 65* 65*  CREATININE 2.23* 3.06* 2.96* 2.82* 2.27*  CALCIUM 8.0* 8.5 7.9* 8.1* 8.3*   Liver Function Tests:  Recent Labs Lab 07/31/12 1207 08/01/12 0430  AST 14 11  ALT 10 10  ALKPHOS 35* 36*  BILITOT 1.2 1.2  PROT 5.5* 5.2*  ALBUMIN 2.6* 2.3*   CBC:  Recent Labs Lab 07/28/12 1334 07/31/12 1056 07/31/12 1207 07/31/12 2000  08/01/12 1017 08/02/12 0500  WBC 1.1* 0.5* 0.5* 0.4* 0.6* 0.6*  NEUTROABS 1.0*  --  0.4* 0.4*  --   --   HGB 8.6* 7.2* 6.7* 6.4* 7.6* 7.6*  HCT 26.1* 21.6* 20.5* 19.4* 23.0* 23.5*  MCV 96.7 96.4 96.2 93.7 93.1 93.3  PLT 97* 74* 70* 67* 65* 80*   CBG:  Recent Labs Lab 08/01/12 0916 08/01/12 1245 08/01/12 1653 08/01/12 2207 08/02/12 0818  GLUCAP 107* 100* 127* 168* 138*    Recent Results (from the past 240 hour(s))  CULTURE, BLOOD (ROUTINE X 2)     Status: None   Collection Time    07/31/12 12:23 PM      Result Value Range Status   Specimen Description BLOOD LEFT FOREARM   Final   Special Requests     Final   Value: BOTTLES DRAWN AEROBIC AND ANAEROBIC AEB=12CC ANA=8CC   Culture  Setup Time 08/02/2012 00:26   Final   Culture     Final   Value: STAPHYLOCOCCUS AUREUS     Note: RIFAMPIN AND GENTAMICIN SHOULD NOT BE USED AS SINGLE DRUGS FOR TREATMENT OF STAPH INFECTIONS.     Note: Gram Stain Report Called to,Read Back By and Verified With:  MASON J AT 1115 ON 08/01/2012 BY Luetta Nutting M Performed at [facility]   Report Status PENDING   Incomplete  CULTURE, BLOOD (ROUTINE X 2)     Status: None   Collection Time    07/31/12 12:27 PM      Result Value Range Status   Specimen Description BLOOD PORTA CATH DRAWN BY RN LT   Final   Special Requests BOTTLES DRAWN AEROBIC AND ANAEROBIC [facility]   Final   Culture  Setup Time 08/02/2012 00:30   Final   Culture     Final   Value: STAPHYLOCOCCUS AUREUS     Note: Gram Stain Report Called to,Read Back By and Verified With: MASON J AT 1115 ON 08/01/2012 BY Luetta Nutting M Performed at [facility]   Report Status PENDING   Incomplete  URINE CULTURE     Status: None   Collection Time    07/31/12 12:55 PM      Result Value Range Status   Specimen Description URINE, CLEAN CATCH   Final   Special Requests NONE   Final   Culture  Setup Time 08/01/2012 03:22   Final   Colony Count NO GROWTH   Final   Culture NO GROWTH   Final   Report  Status 08/02/2012 FINAL   Final  MRSA PCR SCREENING     Status: None   Collection Time    07/31/12  6:02 PM      Result Value Range Status   MRSA by PCR NEGATIVE  NEGATIVE Final   Comment:            The GeneXpert MRSA Assay (FDA     approved for NASAL specimens     only), is one component of a  comprehensive MRSA colonization     surveillance program. It is not     intended to diagnose MRSA     infection nor to guide or     monitor treatment for     MRSA infections.     Studies:  Recent x-ray studies have been reviewed in detail by the Attending Physician  Scheduled Meds:  Scheduled Meds: . antiseptic oral rinse  15 mL Mouth Rinse q12n4p  .  ceFAZolin (ANCEF) IV  2 g Intravenous Q8H  . chlorhexidine  15 mL Mouth Rinse BID  . insulin aspart  0-9 Units Subcutaneous TID WC  . pantoprazole  40 mg Oral Q1200  . vancomycin  2,000 mg Intravenous Q24H    Time spent on care of this patient: 35 mins   [facility] T  [facility] Office  [phone] Pager - Text Page per Loretha Stapler as per below:  On-Call/Text Page:      [email or web address]      password [email or web address]  If 7PM-7AM, please contact night-coverage [email or web address] Password [facility] 08/02/2012, 10:47 AM   LOS: 2 days

## 2012-08-02 NOTE — Progress Notes (Signed)
Patient ID: Alex Petty, male   DOB: [birth year], 67 y.o.   MRN: [id number]         [facility]    Date of Admission:  07/31/2012   Total days of antibiotics 3         Principal Problem:   Bacteremia Active Problems:   Cellulitis of leg, left   Pustular folliculitis   Acute on chronic kidney disease, stage 3   Venous stasis dermatitis   A-fib   CLL (chronic lymphocytic leukemia)   Septic shock   Neutropenic fever   Antineoplastic chemotherapy induced pancytopenia   Acute back pain   . antiseptic oral rinse  15 mL Mouth Rinse q12n4p  .  ceFAZolin (ANCEF) IV  2 g Intravenous Q8H  . chlorhexidine  15 mL Mouth Rinse BID  . insulin aspart  0-9 Units Subcutaneous TID WC  . pantoprazole  40 mg Oral Q1200  . vancomycin  2,000 mg Intravenous Q24H    Subjective: He is feeling a little bit better overall but still has severe back pain. The pain is up to 11 out of 10 when he moves. Is also having some nausea without vomiting today. He denies right leg pain.   Past Medical History  Diagnosis Date  . Arthritis   . Hypertension   . DJD (degenerative joint disease)   . DJD (degenerative joint disease), ankle and foot   . Arthritis   . Cellulitis   . Arthritis   . Chronic back pain   . Gout   . Dysrhythmia     afibrillation  . Atrial fibrillation   . Chronic kidney disease   . Colon cancer     2005  . CLL (chronic lymphocytic leukemia) 07/19/2012    History  Substance Use Topics  . Smoking status: Never Smoker   . Smokeless tobacco: Never Used  . Alcohol Use: No    Family History  Problem Relation Age of Onset  . Anesthesia problems Neg Hx   . Hypotension Neg Hx   . Malignant hyperthermia Neg Hx   . Pseudochol deficiency Neg Hx     Allergies  Allergen Reactions  . Ace Inhibitors Other (See Comments)    cough  . Rocephin (Ceftriaxone Sodium) Hives  . Beta Adrenergic Blockers Rash    Objective: Temp:  [98.5 F (36.9 C)-99.4 F  (37.4 C)] 98.6 F (37 C) (07/23 1545) Pulse Rate:  [87-139] 125 (07/23 1545) Resp:  [14-28] 16 (07/23 1545) BP: (95-129)/(58-89) 117/80 mmHg (07/23 1545) SpO2:  [97 %-99 %] 99 % (07/23 1545)  General: He is alert and able to answer questions Skin: Pustular lesions in right thigh are beginning to resolve. Right lower leg cellulitis is improving Lungs: Clear Cor: Distant heart sounds with regular S1 and S2 no murmurs. Right anterior chest Port-A-Cath site appears normal Abdomen: Obese, soft and nontender   Lab Results Lab Results  Component Value Date   WBC 0.6* 08/02/2012   HGB 7.6* 08/02/2012   HCT 23.5* 08/02/2012   MCV 93.3 08/02/2012   PLT 80* 08/02/2012    Lab Results  Component Value Date   CREATININE 2.27* 08/02/2012   BUN 65* 08/02/2012   NA 136 08/02/2012   K 4.5 08/02/2012   CL 104 08/02/2012   CO2 22 08/02/2012    Lab Results  Component Value Date   ALT 10 08/01/2012   AST 11 08/01/2012   ALKPHOS 36* 08/01/2012   BILITOT 1.2 08/01/2012  Microbiology: Recent Results (from the past 240 hour(s))  CULTURE, BLOOD (ROUTINE X 2)     Status: None   Collection Time    07/31/12 12:23 PM      Result Value Range Status   Specimen Description BLOOD LEFT FOREARM   Final   Special Requests     Final   Value: BOTTLES DRAWN AEROBIC AND ANAEROBIC AEB=12CC ANA=8CC   Culture  Setup Time 08/02/2012 00:26   Final   Culture     Final   Value: STAPHYLOCOCCUS AUREUS     Note: RIFAMPIN AND GENTAMICIN SHOULD NOT BE USED AS SINGLE DRUGS FOR TREATMENT OF STAPH INFECTIONS.     Note: Gram Stain Report Called to,Read Back By and Verified With:  MASON J AT 1115 ON 08/01/2012 BY Luetta Nutting M Performed at [facility]   Report Status PENDING   Incomplete  CULTURE, BLOOD (ROUTINE X 2)     Status: None   Collection Time    07/31/12 12:27 PM      Result Value Range Status   Specimen Description BLOOD PORTA CATH DRAWN BY RN LT   Final   Special Requests BOTTLES DRAWN AEROBIC AND ANAEROBIC  [facility]   Final   Culture  Setup Time 08/02/2012 00:30   Final   Culture     Final   Value: STAPHYLOCOCCUS AUREUS     Note: Gram Stain Report Called to,Read Back By and Verified With: MASON J AT 1115 ON 08/01/2012 BY Luetta Nutting M Performed at [facility]   Report Status PENDING   Incomplete  URINE CULTURE     Status: None   Collection Time    07/31/12 12:55 PM      Result Value Range Status   Specimen Description URINE, CLEAN CATCH   Final   Special Requests NONE   Final   Culture  Setup Time 08/01/2012 03:22   Final   Colony Count NO GROWTH   Final   Culture NO GROWTH   Final   Report Status 08/02/2012 FINAL   Final  MRSA PCR SCREENING     Status: None   Collection Time    07/31/12  6:02 PM      Result Value Range Status   MRSA by PCR NEGATIVE  NEGATIVE Final   Comment:            The GeneXpert MRSA Assay (FDA     approved for NASAL specimens     only), is one component of a     comprehensive MRSA colonization     surveillance program. It is not     intended to diagnose MRSA     infection nor to guide or     monitor treatment for     MRSA infections.   Assessment: He has staph aureus bacteremia. He is showing some improvement and I will continue his current 2 drug regimen pending 10:00 susceptibility results. We'll also go ahead and repeat blood cultures. Although his right leg cellulitis and folliculitis may be the source of his bacteremia, it is quite likely that his Port-A-Cath has become infected and for Infectious Disease Society guidelines recommend routine Port-A-Cath removal in patients with staph aureus bacteremia.  "32. Long-term catheters should be removed from patients with CRBSI associated with any of the following conditions: severe sepsis; suppurative thrombophlebitis; endocarditis; bloodstream infection that continues despite 172 h of antimicrobial therapy to which the infecting microbes are susceptible; or infections due to S. aureus, P. aeruginosa, fungi,  or  mycobacteria (A-II). Short-term catheters should be removed from patients with CRBSI due to gram-negative bacilli, S. aureus, enterococci, fungi, and mycobacteria (A-II)."  He will also need transthoracic echocardiography and lumbar MRI when he is more stable.  Plan: 1. Continue vancomycin and cefazolin pending antibiotic susceptibility results 2. Repeat blood cultures 3. He will need transthoracic echocardiogram and lumbar MRI when he is able to tolerate procedures  Cliffton Asters, MD [facility] [phone] pager   [phone] cell 08/02/2012, 4:54 PM

## 2012-08-02 NOTE — Progress Notes (Deleted)
PT does not wear a cpap

## 2012-08-03 ENCOUNTER — Inpatient Hospital Stay (HOSPITAL_COMMUNITY): Payer: Medicare Other

## 2012-08-03 DIAGNOSIS — R7881 Bacteremia: Secondary | ICD-10-CM

## 2012-08-03 LAB — HEMOGLOBIN A1C
Hgb A1c MFr Bld: 5.5 % (ref <5.7)
Mean Plasma Glucose: 111 mg/dL (ref <117)

## 2012-08-03 LAB — CULTURE, BLOOD (ROUTINE X 2)

## 2012-08-03 LAB — COMPREHENSIVE METABOLIC PANEL
ALT: <5 U/L (ref 0–53)
Albumin: 2 g/dL — ABNORMAL LOW (ref 3.5–5.2)
Alkaline Phosphatase: 35 U/L — ABNORMAL LOW (ref 39–117)
Potassium: 4.3 mEq/L (ref 3.5–5.1)
Sodium: 135 mEq/L (ref 135–145)
Total Protein: 4.8 g/dL — ABNORMAL LOW (ref 6.0–8.3)

## 2012-08-03 LAB — GLUCOSE, CAPILLARY
Glucose-Capillary: 134 mg/dL — ABNORMAL HIGH (ref 70–99)
Glucose-Capillary: 110 mg/dL — ABNORMAL HIGH (ref 70–99)

## 2012-08-03 LAB — CBC
Platelets: 70 10*3/uL — ABNORMAL LOW (ref 150–400)
RBC: 2.5 MIL/uL — ABNORMAL LOW (ref 4.22–5.81)
WBC: 0.6 10*3/uL — CL (ref 4.0–10.5)

## 2012-08-03 MED ORDER — DEXTROSE 5 % IV SOLN
5.0000 mg/h | INTRAVENOUS | Status: DC
  Administered 2012-08-03: 10 mg/h via INTRAVENOUS
  Administered 2012-08-03: 5 mg/h via INTRAVENOUS
  Administered 2012-08-04 – 2012-08-10 (×21): 15 mg/h via INTRAVENOUS
  Filled 2012-08-03 (×24): qty 100

## 2012-08-03 NOTE — Progress Notes (Signed)
Patient ID: Alex Petty, male   DOB: Jan 11, 1946, 67 y.o.   MRN: [id number]         [facility]    Date of Admission:  07/31/2012   Total days of antibiotics 4         Principal Problem:   Bacteremia Active Problems:   Cellulitis of leg, left   Pustular folliculitis   Acute on chronic kidney disease, stage 3   Venous stasis dermatitis   A-fib   CLL (chronic lymphocytic leukemia)   Septic shock   Neutropenic fever   Antineoplastic chemotherapy induced pancytopenia   Acute back pain   . antiseptic oral rinse  15 mL Mouth Rinse q12n4p  .  ceFAZolin (ANCEF) IV  2 g Intravenous Q8H  . chlorhexidine  15 mL Mouth Rinse BID  . insulin aspart  0-9 Units Subcutaneous TID WC  . pantoprazole  40 mg Oral Q1200  . vancomycin  2,000 mg Intravenous Q24H    Subjective: Alex Petty has persistent nausea and has had several episodes of coffee ground emesis today. When Alex Petty lays flat in bed his back pain is only about 2 but it becomes much more severe when Alex Petty tries to sit up in bed. Alex Petty is not having any more pain in his right leg. Review of Systems: Pertinent items are noted in HPI.  Past Medical History  Diagnosis Date  . Arthritis   . Hypertension   . DJD (degenerative joint disease)   . DJD (degenerative joint disease), ankle and foot   . Arthritis   . Cellulitis   . Arthritis   . Chronic back pain   . Gout   . Dysrhythmia     afibrillation  . Atrial fibrillation   . Chronic kidney disease   . Colon cancer     2005  . CLL (chronic lymphocytic leukemia) 07/19/2012    History  Substance Use Topics  . Smoking status: Never Smoker   . Smokeless tobacco: Never Used  . Alcohol Use: No    Family History  Problem Relation Age of Onset  . Anesthesia problems Neg Hx   . Hypotension Neg Hx   . Malignant hyperthermia Neg Hx   . Pseudochol deficiency Neg Hx     Allergies  Allergen Reactions  . Ace Inhibitors Other (See Comments)    cough  . Rocephin  (Ceftriaxone Sodium) Hives  . Beta Adrenergic Blockers Rash    Objective: Temp:  [98 F (36.7 C)-98.8 F (37.1 C)] 98.2 F (36.8 C) (07/24 1239) Pulse Rate:  [87-126] 119 (07/24 1239) Resp:  [13-24] 18 (07/24 1239) BP: (111-129)/(69-90) 129/90 mmHg (07/24 1239) SpO2:  [97 %-100 %] 100 % (07/24 1239) Weight:  [148 kg (326 lb 4.5 oz)] 148 kg (326 lb 4.5 oz) (07/24 0415)  General: Alex Petty appears uncomfortable due to nausea Skin: Right lower leg cellulitis is resolving. Cluster of folliculitis in his right groin is also resolving Lungs: Clear Cor: Regular S1 and S2 no murmurs. Right anterior chest Port-A-Cath site appears normal Abdomen: Obese, soft with mild epigastric tenderness   Lab Results Lab Results  Component Value Date   WBC 0.6* 08/03/2012   HGB 7.7* 08/03/2012   HCT 23.5* 08/03/2012   MCV 94.0 08/03/2012   PLT 70* 08/03/2012    Lab Results  Component Value Date   CREATININE 1.93* 08/03/2012   BUN 62* 08/03/2012   NA 135 08/03/2012   K 4.3 08/03/2012   CL 103 08/03/2012  CO2 23 08/03/2012    Lab Results  Component Value Date   ALT <5 08/03/2012   AST 7 08/03/2012   ALKPHOS 35* 08/03/2012   BILITOT 0.5 08/03/2012      Microbiology: Recent Results (from the past 240 hour(s))  CULTURE, BLOOD (ROUTINE X 2)     Status: None   Collection Time    07/31/12 12:23 PM      Result Value Range Status   Specimen Description BLOOD LEFT FOREARM   Final   Special Requests     Final   Value: BOTTLES DRAWN AEROBIC AND ANAEROBIC AEB=12CC ANA=8CC   Culture  Setup Time 08/02/2012 00:26   Final   Culture     Final   Value: STAPHYLOCOCCUS AUREUS     Note: RIFAMPIN AND GENTAMICIN SHOULD NOT BE USED AS SINGLE DRUGS FOR TREATMENT OF STAPH INFECTIONS.     Note: Gram Stain Report Called to,Read Back By and Verified With:  MASON J AT 1115 ON 08/01/2012 BY Ginette Pitman Performed at [facility]   Report Status 08/03/2012 FINAL   Final   Organism ID, Bacteria STAPHYLOCOCCUS AUREUS   Final    CULTURE, BLOOD (ROUTINE X 2)     Status: None   Collection Time    07/31/12 12:27 PM      Result Value Range Status   Specimen Description BLOOD PORTA CATH DRAWN BY RN LT   Final   Special Requests BOTTLES DRAWN AEROBIC AND ANAEROBIC [facility]   Final   Culture  Setup Time 08/02/2012 00:30   Final   Culture     Final   Value: STAPHYLOCOCCUS AUREUS     Note: SUSCEPTIBILITIES PERFORMED ON PREVIOUS CULTURE WITHIN THE LAST 5 DAYS.     Note: Gram Stain Report Called to,Read Back By and Verified With: MASON J AT 1115 ON 08/01/2012 BY Ginette Pitman Performed at [facility]   Report Status 08/03/2012 FINAL   Final  URINE CULTURE     Status: None   Collection Time    07/31/12 12:55 PM      Result Value Range Status   Specimen Description URINE, CLEAN CATCH   Final   Special Requests NONE   Final   Culture  Setup Time 08/01/2012 03:22   Final   Colony Count NO GROWTH   Final   Culture NO GROWTH   Final   Report Status 08/02/2012 FINAL   Final  MRSA PCR SCREENING     Status: None   Collection Time    07/31/12  6:02 PM      Result Value Range Status   MRSA by PCR NEGATIVE  NEGATIVE Final   Comment:            The GeneXpert MRSA Assay (FDA     approved for NASAL specimens     only), is one component of a     comprehensive MRSA colonization     surveillance program. It is not     intended to diagnose MRSA     infection nor to guide or     monitor treatment for     MRSA infections.  CULTURE, BLOOD (ROUTINE X 2)     Status: None   Collection Time    08/02/12  5:25 PM      Result Value Range Status   Specimen Description BLOOD RIGHT ARM   Final   Special Requests BOTTLES DRAWN AEROBIC AND ANAEROBIC 10CC   Final   Culture  Setup  Time 08/02/2012 22:29   Final   Culture     Final   Value:        BLOOD CULTURE RECEIVED NO GROWTH TO DATE CULTURE WILL BE HELD FOR 5 DAYS BEFORE ISSUING A FINAL NEGATIVE REPORT   Report Status PENDING   Incomplete  CULTURE, BLOOD (ROUTINE X 2)     Status: None    Collection Time    08/02/12  5:35 PM      Result Value Range Status   Specimen Description BLOOD RIGHT ARM   Final   Special Requests BOTTLES DRAWN AEROBIC AND ANAEROBIC 10CC   Final   Culture  Setup Time 08/02/2012 22:30   Final   Culture     Final   Value:        BLOOD CULTURE RECEIVED NO GROWTH TO DATE CULTURE WILL BE HELD FOR 5 DAYS BEFORE ISSUING A FINAL NEGATIVE REPORT   Report Status PENDING   Incomplete    Assessment: Alex Petty has MSSA bacteremia. I will continue IV cefazolin as a single agent. His right leg infection is improving. Repeat blood cultures are negative at 24 hours. Alex Petty will need a transthoracic echocardiogram and a lumbar MRI when Alex Petty is able to be transported more comfortably. General surgery needs to be consulted for Port-A-Cath removal according to published guidelines. Once we know repeat blood cultures are negative and final Alex Petty can have a PICC placed to complete therapy for his bacteremia before having a new Port-A-Cath placed at a later date.  Plan: 1. Continue cefazolin 2. Discontinue vancomycin 3. Await results of repeat blood cultures 4. Alex Petty needs a TTE and lumbar MRI 5. Recommend general surgery consultation  Cliffton Asters, MD [facility] [phone] pager   [phone] cell 08/03/2012, 2:30 PM

## 2012-08-03 NOTE — Progress Notes (Addendum)
ANTIBIOTIC CONSULT NOTE - FOLLOW UP  Pharmacy Consult for vancomycin and cefazolin Indication: stap aureus bacteremia  Allergies  Allergen Reactions  . Ace Inhibitors Other (See Comments)    cough  . Rocephin (Ceftriaxone Sodium) Hives  . Beta Adrenergic Blockers Rash    Patient Measurements: Height: 5' 8.5" (174 cm) Weight: 326 lb 4.5 oz (148 kg) IBW/kg (Calculated) : 69.55  Vital Signs: Temp: 98.1 F (36.7 C) (07/24 0842) Temp src: Oral (07/24 0842) BP: 115/81 mmHg (07/24 0842) Pulse Rate: 116 (07/24 0842) Intake/Output from previous day: 07/23 0701 - 07/24 0700 In: 2984.6 [P.O.:120; I.V.:2214.6; IV Piggyback:650] Out: 2675 [Urine:2675] Intake/Output from this shift: Total I/O In: 250 [I.V.:250] Out: -   Labs:  Recent Labs  08/01/12 0430 08/01/12 1017 08/02/12 0500 08/03/12 0500  WBC  --  0.6* 0.6* 0.6*  HGB  --  7.6* 7.6* 7.7*  PLT  --  65* 80* 70*  CREATININE 2.82*  --  2.27* 1.93*   Estimated Creatinine Clearance: 53.1 ml/min (by C-G formula based on Cr of 1.93). No results found for this basename: VANCOTROUGH, VANCOPEAK, VANCORANDOM, GENTTROUGH, GENTPEAK, GENTRANDOM, TOBRATROUGH, TOBRAPEAK, TOBRARND, AMIKACINPEAK, AMIKACINTROU, AMIKACIN,  in the last 72 hours   Microbiology: Recent Results (from the past 720 hour(s))  CULTURE, BLOOD (ROUTINE X 2)     Status: None   Collection Time    07/31/12 12:23 PM      Result Value Range Status   Specimen Description BLOOD LEFT FOREARM   Final   Special Requests     Final   Value: BOTTLES DRAWN AEROBIC AND ANAEROBIC AEB=12CC ANA=8CC   Culture  Setup Time 08/02/2012 00:26   Final   Culture     Final   Value: STAPHYLOCOCCUS AUREUS     Note: RIFAMPIN AND GENTAMICIN SHOULD NOT BE USED AS SINGLE DRUGS FOR TREATMENT OF STAPH INFECTIONS.     Note: Gram Stain Report Called to,Read Back By and Verified With:  MASON J AT 1115 ON 08/01/2012 BY Ginette Pitman Performed at [facility]   Report Status 08/03/2012 FINAL    Final   Organism ID, Bacteria STAPHYLOCOCCUS AUREUS   Final  CULTURE, BLOOD (ROUTINE X 2)     Status: None   Collection Time    07/31/12 12:27 PM      Result Value Range Status   Specimen Description BLOOD PORTA CATH DRAWN BY RN LT   Final   Special Requests BOTTLES DRAWN AEROBIC AND ANAEROBIC [facility]   Final   Culture  Setup Time 08/02/2012 00:30   Final   Culture     Final   Value: STAPHYLOCOCCUS AUREUS     Note: SUSCEPTIBILITIES PERFORMED ON PREVIOUS CULTURE WITHIN THE LAST 5 DAYS.     Note: Gram Stain Report Called to,Read Back By and Verified With: MASON J AT 1115 ON 08/01/2012 BY Ginette Pitman Performed at [facility]   Report Status 08/03/2012 FINAL   Final  URINE CULTURE     Status: None   Collection Time    07/31/12 12:55 PM      Result Value Range Status   Specimen Description URINE, CLEAN CATCH   Final   Special Requests NONE   Final   Culture  Setup Time 08/01/2012 03:22   Final   Colony Count NO GROWTH   Final   Culture NO GROWTH   Final   Report Status 08/02/2012 FINAL   Final  MRSA PCR SCREENING     Status: None  Collection Time    07/31/12  6:02 PM      Result Value Range Status   MRSA by PCR NEGATIVE  NEGATIVE Final   Comment:            The GeneXpert MRSA Assay (FDA     approved for NASAL specimens     only), is one component of a     comprehensive MRSA colonization     surveillance program. It is not     intended to diagnose MRSA     infection nor to guide or     monitor treatment for     MRSA infections.  CULTURE, BLOOD (ROUTINE X 2)     Status: None   Collection Time    08/02/12  5:25 PM      Result Value Range Status   Specimen Description BLOOD RIGHT ARM   Final   Special Requests BOTTLES DRAWN AEROBIC AND ANAEROBIC 10CC   Final   Culture  Setup Time 08/02/2012 22:29   Final   Culture     Final   Value:        BLOOD CULTURE RECEIVED NO GROWTH TO DATE CULTURE WILL BE HELD FOR 5 DAYS BEFORE ISSUING A FINAL NEGATIVE REPORT   Report Status PENDING    Incomplete  CULTURE, BLOOD (ROUTINE X 2)     Status: None   Collection Time    08/02/12  5:35 PM      Result Value Range Status   Specimen Description BLOOD RIGHT ARM   Final   Special Requests BOTTLES DRAWN AEROBIC AND ANAEROBIC 10CC   Final   Culture  Setup Time 08/02/2012 22:30   Final   Culture     Final   Value:        BLOOD CULTURE RECEIVED NO GROWTH TO DATE CULTURE WILL BE HELD FOR 5 DAYS BEFORE ISSUING A FINAL NEGATIVE REPORT   Report Status PENDING   Incomplete    Anti-infectives   Start     Dose/Rate Route Frequency Ordered Stop   08/02/12 1400  vancomycin (VANCOCIN) 2,000 mg in sodium chloride 0.9 % 500 mL IVPB  Status:  Discontinued     2,000 mg 250 mL/hr over 120 Minutes Intravenous Every 48 hours 07/31/12 2041 08/01/12 1124   08/01/12 1800  vancomycin (VANCOCIN) 2,000 mg in sodium chloride 0.9 % 500 mL IVPB     2,000 mg 250 mL/hr over 120 Minutes Intravenous Every 24 hours 08/01/12 1124     08/01/12 1730  ceFAZolin (ANCEF) IVPB 2 g/50 mL premix     2 g 100 mL/hr over 30 Minutes Intravenous 3 times per day 08/01/12 1621     08/01/12 1200  acyclovir (ZOVIRAX) 750 mg in dextrose 5 % 150 mL IVPB  Status:  Discontinued     750 mg 165 mL/hr over 60 Minutes Intravenous Every 12 hours 08/01/12 1124 08/01/12 1544   08/01/12 0000  imipenem-cilastatin (PRIMAXIN) 250 mg in sodium chloride 0.9 % 100 mL IVPB  Status:  Discontinued     250 mg 200 mL/hr over 30 Minutes Intravenous 4 times per day 07/31/12 2041 08/01/12 1544   07/31/12 2115  vancomycin (VANCOCIN) IVPB 1000 mg/200 mL premix     1,000 mg 200 mL/hr over 60 Minutes Intravenous NOW 07/31/12 2041 07/31/12 2215   07/31/12 2030  acyclovir (ZOVIRAX) 1,000 mg in dextrose 5 % 150 mL IVPB  Status:  Discontinued     1,000 mg 170 mL/hr over 60 Minutes Intravenous  Every 24 hours 07/31/12 1921 08/01/12 1124   07/31/12 2000  micafungin (MYCAMINE) 150 mg in sodium chloride 0.9 % 100 mL IVPB  Status:  Discontinued     150 mg 100  mL/hr over 1 Hours Intravenous Daily 07/31/12 1921 08/01/12 1544   07/31/12 1315  imipenem-cilastatin (PRIMAXIN) 500 mg in sodium chloride 0.9 % 100 mL IVPB     500 mg 200 mL/hr over 30 Minutes Intravenous  Once 07/31/12 1306 07/31/12 1627   07/31/12 1300  vancomycin (VANCOCIN) IVPB 1000 mg/200 mL premix     1,000 mg 200 mL/hr over 60 Minutes Intravenous  Once 07/31/12 1252 07/31/12 1627      Assessment: 67 yo morbidly obese male admitted 7/21 for septic shock with neutropenia. Patient started on vanc/primaxin/acyclovir/micafungin, but narrowed to current vancomycin and cefazolin for cultures revealing staph aureus. Source likely R thigh vesicular lesions/R foot. WBC 0.6 ANC 400, Temp 98.1, ID consulted. SCr 1.93 CrCl 53  Vanc 7/21 >> Primaxin 7/21 >>7/22 Acyclovir 7/21 >>7/22 Micafungin 7/21 >7/22 Cefazolin 7/22 >>  Bldx2 7/21 >> Staph aureus Urine 7/21 >> no growth final 7/21>>MRSA neg 7/23 repeat Bld cultures>>  Goal of Therapy:  Vancomycin trough level 15-20 mcg/ml  Plan:  Continue Vancomycin 2g q24h Continue cefazolin 2g q8h F/u renal function, CBC, clinical status, trough at steady state F/u repeat blood cultures, possible Port-A-Cath infected per MD note  Britt Bottom B. Artelia Laroche, PharmD Clinical Pharmacist - Resident Pager: [phone] Phone: [phone] 08/03/2012 10:54 AM

## 2012-08-03 NOTE — Progress Notes (Signed)
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 He developed rash on right thigh originally thought to be zoster. He was started on zovirax as outpt. He developed progressive weakness to the point he had trouble walking. He was becoming more confused. He had fever to 103F. Family took him to [facility] ED. He was found to be hypotensive, febrile, anemic, and neutropenic. He had CVL placed, was given IV fluid, transfused 1 unit PRBC, and started on ABx. He was transfered to [facility] for further assessment.   SIGNIFICANT EVENTS:  7/21 Transfer to [facility], PRBC transfusion   Assessment/Plan:  Neutropenic fever with Septic shock - Staph aureus bacteremia (2/2 cultures +) did not require pressors, lactate cleared - possible sources are Rt thigh lesions and right foot - confirmed to be staph on 7/23 - may require removal of Port-A-Cath - procalcitonin is trending downward - ID directing abx and extent of further w/u required   Vomiting High-pitched bowel sounds-obtaining abdominal x-rays to ensure that he does not have an ileus  Pustular folliculitis Cont abx coverage - improving on exam  Venous stasis dermatitis  Cellulitis of leg, left Appears to have resolved w/ abx tx  Pancytopenia 2nd to chemotherapy for CLL neupogen x 1 on 7/21 - Hgb is holding steady - WBC and plts also stable   Chronic Afib Rate is poorly controlled, likely reflective of hypovolemia - start IV Cardizem today  Coagulopathy 2nd to chronic coumadin for A fib INR remains elevated - holding coumadin tx at this time  Hyperglycemia Likely stress response - check A1c  Acute on chronic renal failure baseline cr 2.5 - crt is currently better than his baseline at 1.93  Hyperkalemia resolved  Acute toxic metabolic encephalopathy  2nd to sepsis -  waxing and waning - follow clinically - check B12 and folate  Hx of HTN BP well controlled this time/borderline hypotensive - volume resuscitate and follow   Code Status: FULL Family Communication: spoke w/ daughter at bedside Disposition Plan: SDU  Consultants: PCCM >> TRH ID  Procedures: Rt IJ CVL 7/21 >>   Antibiotics: Vancomycin 7/21 >>  Cefazolin 7/22 >> Primaxin 7/21 >> 7/22 Acyclovir 7/21 >> 7/22 Micafungin 7/21 >> 7/22   DVT prophylaxis: warfarin  HPI/Subjective: Pt is sleeping but can be awakened-family states he vomited brownish colored liquid today "coffee grounds" patient does not complaining of any abdominal pain or fullness. No bowel movements.  Objective: Blood pressure 129/90, pulse 119, temperature 98.2 F (36.8 C), temperature source Oral, resp. rate 18, height 5' 8.5" (1.74 m), weight 148 kg (326 lb 4.5 oz), SpO2 100.00%.  Intake/Output Summary (Last 24 hours) at 08/03/12 1542 Last data filed at 08/03/12 1300  Gross per 24 hour  Intake 3459.58 ml  Output   1775 ml  Net 1684.58 ml    Exam: General: No acute respiratory distress at rest in bed Lungs: Clear to auscultation bilaterally without wheezes or crackles - BS are distant Cardiovascular: tachycardic but regular - no appreciable gallup or M but HS are quite distant Abdomen: morbidly obese, nontende, soft, bowel sounds positive, no rebound, no ascites, no appreciable mass Extremities: change of chronic venous stasis B - 1+ edema B LE   Data Reviewed: Basic Metabolic Panel:  Recent Labs Lab 07/31/12 1207 07/31/12 2000 08/01/12 0430 08/02/12 0500 08/03/12 0500  NA 132*  132* 135 136 135  K 5.1 4.9 4.7 4.5 4.3  CL 97 100 101 104 103  CO2 22 23 22 22 23   GLUCOSE 122* 125* 112* 126* 130*  BUN 62* 65* 65* 65* 62*  CREATININE 3.06* 2.96* 2.82* 2.27* 1.93*  CALCIUM 8.5 7.9* 8.1* 8.3* 8.2*   Liver Function Tests:  Recent Labs Lab 07/31/12 1207 08/01/12 0430 08/03/12 0500  AST 14 11  7   ALT 10 10 <5  ALKPHOS 35* 36* 35*  BILITOT 1.2 1.2 0.5  PROT 5.5* 5.2* 4.8*  ALBUMIN 2.6* 2.3* 2.0*   CBC:  Recent Labs Lab 07/28/12 1334  07/31/12 1207 07/31/12 2000 08/01/12 1017 08/02/12 0500 08/03/12 0500  WBC 1.1*  < > 0.5* 0.4* 0.6* 0.6* 0.6*  NEUTROABS 1.0*  --  0.4* 0.4*  --   --   --   HGB 8.6*  < > 6.7* 6.4* 7.6* 7.6* 7.7*  HCT 26.1*  < > 20.5* 19.4* 23.0* 23.5* 23.5*  MCV 96.7  < > 96.2 93.7 93.1 93.3 94.0  PLT 97*  < > 70* 67* 65* 80* 70*  < > = values in this interval not displayed. CBG:  Recent Labs Lab 08/02/12 1138 08/02/12 1701 08/02/12 2111 08/03/12 0835 08/03/12 1148  GLUCAP 113* 139* 134* 116* 110*    Recent Results (from the past 240 hour(s))  CULTURE, BLOOD (ROUTINE X 2)     Status: None   Collection Time    07/31/12 12:23 PM      Result Value Range Status   Specimen Description BLOOD LEFT FOREARM   Final   Special Requests     Final   Value: BOTTLES DRAWN AEROBIC AND ANAEROBIC AEB=12CC ANA=8CC   Culture  Setup Time 08/02/2012 00:26   Final   Culture     Final   Value: STAPHYLOCOCCUS AUREUS     Note: RIFAMPIN AND GENTAMICIN SHOULD NOT BE USED AS SINGLE DRUGS FOR TREATMENT OF STAPH INFECTIONS.     Note: Gram Stain Report Called to,Read Back By and Verified With:  MASON J AT 1115 ON 08/01/2012 BY Ginette Pitman Performed at [facility]   Report Status 08/03/2012 FINAL   Final   Organism ID, Bacteria STAPHYLOCOCCUS AUREUS   Final  CULTURE, BLOOD (ROUTINE X 2)     Status: None   Collection Time    07/31/12 12:27 PM      Result Value Range Status   Specimen Description BLOOD PORTA CATH DRAWN BY RN LT   Final   Special Requests BOTTLES DRAWN AEROBIC AND ANAEROBIC [facility]   Final   Culture  Setup Time 08/02/2012 00:30   Final   Culture     Final   Value: STAPHYLOCOCCUS AUREUS     Note: SUSCEPTIBILITIES PERFORMED ON PREVIOUS CULTURE WITHIN THE LAST 5 DAYS.     Note: Gram Stain Report Called to,Read Back By and Verified With: MASON J AT 1115  ON 08/01/2012 BY Ginette Pitman Performed at [facility]   Report Status 08/03/2012 FINAL   Final  URINE CULTURE     Status: None   Collection Time    07/31/12 12:55 PM      Result Value Range Status   Specimen Description URINE, CLEAN CATCH   Final   Special Requests NONE   Final   Culture  Setup Time 08/01/2012 03:22   Final   Colony Count NO GROWTH   Final   Culture NO GROWTH   Final   Report Status  08/02/2012 FINAL   Final  MRSA PCR SCREENING     Status: None   Collection Time    07/31/12  6:02 PM      Result Value Range Status   MRSA by PCR NEGATIVE  NEGATIVE Final   Comment:            The GeneXpert MRSA Assay (FDA     approved for NASAL specimens     only), is one component of a     comprehensive MRSA colonization     surveillance program. It is not     intended to diagnose MRSA     infection nor to guide or     monitor treatment for     MRSA infections.  CULTURE, BLOOD (ROUTINE X 2)     Status: None   Collection Time    08/02/12  5:25 PM      Result Value Range Status   Specimen Description BLOOD RIGHT ARM   Final   Special Requests BOTTLES DRAWN AEROBIC AND ANAEROBIC 10CC   Final   Culture  Setup Time 08/02/2012 22:29   Final   Culture     Final   Value:        BLOOD CULTURE RECEIVED NO GROWTH TO DATE CULTURE WILL BE HELD FOR 5 DAYS BEFORE ISSUING A FINAL NEGATIVE REPORT   Report Status PENDING   Incomplete  CULTURE, BLOOD (ROUTINE X 2)     Status: None   Collection Time    08/02/12  5:35 PM      Result Value Range Status   Specimen Description BLOOD RIGHT ARM   Final   Special Requests BOTTLES DRAWN AEROBIC AND ANAEROBIC 10CC   Final   Culture  Setup Time 08/02/2012 22:30   Final   Culture     Final   Value:        BLOOD CULTURE RECEIVED NO GROWTH TO DATE CULTURE WILL BE HELD FOR 5 DAYS BEFORE ISSUING A FINAL NEGATIVE REPORT   Report Status PENDING   Incomplete     Studies:  Recent x-ray studies have been reviewed in detail by the Attending  Physician  Scheduled Meds:  Scheduled Meds: . antiseptic oral rinse  15 mL Mouth Rinse q12n4p  .  ceFAZolin (ANCEF) IV  2 g Intravenous Q8H  . chlorhexidine  15 mL Mouth Rinse BID  . insulin aspart  0-9 Units Subcutaneous TID WC  . pantoprazole  40 mg Oral Q1200    Time spent on care of this patient: 35 mins   [facility]  [facility] Office  [phone] Pager - Text Page per Loretha Stapler as per below:  On-Call/Text Page:      [email or web address]      password [email or web address]  If 7PM-7AM, please contact night-coverage [email or web address] Password [facility] 08/03/2012, 3:42 PM   LOS: 3 days

## 2012-08-03 NOTE — Evaluation (Signed)
Physical Therapy Evaluation Patient Details Name: Alex Petty MRN: [id number] DOB: 05/20/1945 Today's Date: 08/03/2012 Time: 8657-8469 PT Time Calculation (min): 34 min  PT Assessment / Plan / Recommendation History of Present Illness  67 yo morbidly obese male admitted 7/21 for septic shock with neutropenia. Patient started on vanc/primaxin/acyclovir/micafungin, but narrowed to current vancomycin and cefazolin for cultures revealing staph aureus. Source likely R thigh vesicular lesions/R foot  Clinical Impression  Limited session due to back pain.  Pt with mm spasms sitting EOB and needed to return to supine quickly.  Pt also limited due to nausea & vomiting coffee ground emesis. MD aware.  Pt will benefit from acute PT services to improve overall mobility and prepare for safe d/c home.      PT Assessment  Patient needs continued PT services    Follow Up Recommendations  Home health PT;Supervision/Assistance - 24 hour    Frequency Min 3X/week    Precautions / Restrictions Precautions Precautions: Fall   Pertinent Vitals/Pain C/o back spasms sitting EOB       Mobility  Bed Mobility Bed Mobility: Rolling Right;Right Sidelying to Sit;Sit to Supine Rolling Right: 1: +2 Total assist;With rail Rolling Right: Patient Percentage: 50% Right Sidelying to Sit: 1: +2 Total assist;With rails;HOB elevated Right Sidelying to Sit: Patient Percentage: 50% Sit to Supine: 1: +2 Total assist Sit to Supine: Patient Percentage: 50% Details for Bed Mobility Assistance: +2 (A) with all mobility with cues for hand placement and overall technique due to recent back spasms. Transfers Transfers: Not assessed Ambulation/Gait Ambulation/Gait Assistance: Not tested (comment)     PT Diagnosis: Generalized weakness;Difficulty walking;Acute pain  PT Problem List: Decreased strength;Decreased activity tolerance;Decreased mobility;Decreased balance;Decreased knowledge of use of DME;Pain PT Treatment  Interventions: DME instruction;Gait training;Functional mobility training;Therapeutic activities;Therapeutic exercise;Balance training;Patient/family education     PT Goals(Current goals can be found in the care plan section) Acute Rehab PT Goals Patient Stated Goal: To decrease pain PT Goal Formulation: With patient/family Time For Goal Achievement: 08/10/12 Potential to Achieve Goals: Good  Visit Information  Last PT Received On: 08/03/12 Assistance Needed: +2 History of Present Illness: 67 yo morbidly obese male admitted 7/21 for septic shock with neutropenia. Patient started on vanc/primaxin/acyclovir/micafungin, but narrowed to current vancomycin and cefazolin for cultures revealing staph aureus. Source likely R thigh vesicular lesions/R foot       Prior Functioning  Home Living Family/patient expects to be discharged to:: Private residence Living Arrangements: Spouse/significant other Available Help at Discharge: Family Type of Home: House Home Access: Stairs to enter Secretary/administrator of Steps: 1 Entrance Stairs-Rails: None Home Layout: One level Home Equipment: Cane - single point;Bedside commode;Walker - 2 wheels (lift chair) Prior Function Level of Independence: Independent with assistive device(s) Communication Communication: No difficulties    Cognition  Cognition Arousal/Alertness: Awake/alert Behavior During Therapy: WFL for tasks assessed/performed Overall Cognitive Status: Within Functional Limits for tasks assessed    Extremity/Trunk Assessment Lower Extremity Assessment Lower Extremity Assessment: Generalized weakness   Balance Balance Balance Assessed: Yes Static Sitting Balance Static Sitting - Balance Support: Bilateral upper extremity supported;Feet supported Static Sitting - Level of Assistance: 4: Min assist Static Sitting - Comment/# of Minutes: (A) to maintain balance due to mm spasm resulting pt returning supine  End of Session PT - End  of Session Equipment Utilized During Treatment: Oxygen Activity Tolerance: Patient limited by pain;Other (comment) (Nausea & vomiting) Patient left: in bed;with call bell/phone within reach Nurse Communication: Mobility status  GP  Dolly Harbach 08/03/2012, 2:57 PM  Jake Shark, PT DPT [phone]

## 2012-08-04 ENCOUNTER — Encounter (HOSPITAL_COMMUNITY): Payer: Self-pay | Admitting: General Surgery

## 2012-08-04 DIAGNOSIS — A4901 Methicillin susceptible Staphylococcus aureus infection, unspecified site: Secondary | ICD-10-CM

## 2012-08-04 DIAGNOSIS — R7881 Bacteremia: Secondary | ICD-10-CM

## 2012-08-04 DIAGNOSIS — I369 Nonrheumatic tricuspid valve disorder, unspecified: Secondary | ICD-10-CM

## 2012-08-04 LAB — TYPE AND SCREEN
ABO/RH(D): A POS
Antibody Screen: NEGATIVE
Unit division: 0
Unit division: 0

## 2012-08-04 LAB — GLUCOSE, CAPILLARY
Glucose-Capillary: 124 mg/dL — ABNORMAL HIGH (ref 70–99)
Glucose-Capillary: 172 mg/dL — ABNORMAL HIGH (ref 70–99)
Glucose-Capillary: 117 mg/dL — ABNORMAL HIGH (ref 70–99)

## 2012-08-04 LAB — CBC
Hemoglobin: 7.1 g/dL — ABNORMAL LOW (ref 13.0–17.0)
MCH: 30.6 pg (ref 26.0–34.0)
MCHC: 32.6 g/dL (ref 30.0–36.0)
MCV: 94 fL (ref 78.0–100.0)
Platelets: 65 10*3/uL — ABNORMAL LOW (ref 150–400)

## 2012-08-04 MED ORDER — BISACODYL 10 MG RE SUPP: 10.0000 mg | Freq: Every day | RECTAL | Status: DC | PRN

## 2012-08-04 MED ORDER — FEBUXOSTAT 40 MG PO TABS
40.0000 mg | ORAL_TABLET | Freq: Every morning | ORAL | Status: DC
  Filled 2012-08-04 (×2): qty 1

## 2012-08-04 MED ORDER — CYANOCOBALAMIN 1000 MCG/ML IJ SOLN
1000.0000 ug | Freq: Every day | INTRAMUSCULAR | Status: AC
  Administered 2012-08-04 – 2012-08-06 (×3): 1000 ug via SUBCUTANEOUS
  Filled 2012-08-04 (×3): qty 1

## 2012-08-04 MED ORDER — VITAMIN K1 10 MG/ML IJ SOLN
10.0000 mg | Freq: Once | INTRAVENOUS | Status: AC
  Administered 2012-08-04: 10 mg via INTRAVENOUS
  Filled 2012-08-04: qty 1

## 2012-08-04 MED ORDER — ALLOPURINOL 300 MG PO TABS
300.0000 mg | ORAL_TABLET | Freq: Every day | ORAL | Status: DC
  Filled 2012-08-04 (×2): qty 1

## 2012-08-04 MED ORDER — DOXAZOSIN MESYLATE 4 MG PO TABS
4.0000 mg | ORAL_TABLET | Freq: Every morning | ORAL | Status: DC
  Filled 2012-08-04 (×2): qty 1

## 2012-08-04 NOTE — Progress Notes (Signed)
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 He developed rash on right thigh originally thought to be zoster. He was started on zovirax as outpt. He developed progressive weakness to the point he had trouble walking. He was becoming more confused. He had fever to 103F. Family took him to [facility] ED. He was found to be hypotensive, febrile, anemic, and neutropenic. He had CVL placed, was given IV fluid, transfused 1 unit PRBC, and started on ABx. He was transfered to [facility] for further assessment.   SIGNIFICANT EVENTS:  7/21 - Transfer to [facility] on PCCM service, PRBC transfusion  7/23 - [facility] assumed care of pt   Assessment/Plan:  Neutropenic fever with Septic shock - Staph aureus bacteremia (2/2 cultures +) did not require pressors, lactate cleared - possible sources are Rt thigh lesions and right foot - confirmed to be staph on 7/23 - Gen Surg consulted 7/25 for removal of Port-A-Cath - procalcitonin is trending downward - ID directing abx and extent of further w/u required - TTE pending - lumbar MRI to bed completed when more stable   Illeus - low grade coffee ground emesis KUB confirms ileus - suspect pt has suffered mild mallory-weiss tear due to vomiting in setting of coagulopathy, vs/ chemo associated mucositis - recheck INR - transfuse - follow trend - keep npo - may have to consider NG tube for decompression if ileus does not improve - get him up to chair   Coagulopathy 2nd to chronic coumadin for A fib INR elevated at last check - pt to OR in AM for Port-A-Cath removal - check INR now and reverse to goal INR of 1.4 or < - in setting of coffee ground emesis will transfuse 2U PRBC as precaution to assure pt is stable for OR   Pustular folliculitis Cont abx coverage - improving on exam  Venous stasis  dermatitis  Cellulitis of leg, left Appears to have resolved w/ abx tx  Pancytopenia 2nd to chemotherapy for CLL neupogen x 1 on 7/21 - Hgb is trending downward again - plan for transfusion in setting of ongoing low grade blood loss w/ plan to go to OR in AM - WBC and plts essentially stable   Chronic Afib Rate was poorly controlled, likely reflective of hypovolemia - on IV Cardizem now w/ HR closer to target - transfuse as discussed - follow trend  Hyperglycemia Likely stress response - check A1c  Acute on chronic renal failure baseline cr 2.5 - crt is currently better than his baseline at 1.93  Hyperkalemia resolved  Acute toxic metabolic encephalopathy  2nd to sepsis - waxing and waning - follow clinically - check B12 and folate  Hx of HTN BP well controlled this time/borderline hypotensive - volume resuscitate and follow   Code Status: FULL Family Communication: spoke w/ daughter/wife at bedside Disposition Plan: SDU  Consultants: PCCM >> [facility] ID Gen Surgery   Procedures: Rt IJ CVL 7/21 >>   Antibiotics: Cefazolin 7/22 >> Primaxin 7/21 >> 7/22 Acyclovir 7/21 >> 7/22 Micafungin 7/21 >> 7/22 Vancomycin 7/21 >> 7/24  DVT prophylaxis: SCDs  HPI/Subjective: Pt continues to report nausea, w/ intermittent vomiting, occasionally marked by scant coffee ground material.  He is alert and interactive, and denies cp, sob, or diarrhea.  Objective: Blood pressure 109/75, pulse 101, temperature 98.9 F (37.2 C), temperature source  Oral, resp. rate 19, height 5' 8.5" (1.74 m), weight 148 kg (326 lb 4.5 oz), SpO2 100.00%.  Intake/Output Summary (Last 24 hours) at 08/04/12 1407 Last data filed at 08/04/12 0900  Gross per 24 hour  Intake 2887.09 ml  Output   1450 ml  Net 1437.09 ml    Exam: General: No acute respiratory distress at rest in bed Lungs: Clear to auscultation bilaterally without wheezes or crackles - BS are distant Cardiovascular: tachycardic but regular  - no appreciable gallup or M but HS are quite distant Abdomen: morbidly obese, nontender, protuberant, soft, bowel sounds positive but high pitched, no rebound, no ascites, no appreciable mass Extremities: change of chronic venous stasis B - 1+ edema B LE - charcot joints B ankles  Data Reviewed: Basic Metabolic Panel:  Recent Labs Lab 07/31/12 1207 07/31/12 2000 08/01/12 0430 08/02/12 0500 08/03/12 0500  NA 132* 132* 135 136 135  K 5.1 4.9 4.7 4.5 4.3  CL 97 100 101 104 103  CO2 22 23 22 22 23   GLUCOSE 122* 125* 112* 126* 130*  BUN 62* 65* 65* 65* 62*  CREATININE 3.06* 2.96* 2.82* 2.27* 1.93*  CALCIUM 8.5 7.9* 8.1* 8.3* 8.2*   Liver Function Tests:  Recent Labs Lab 07/31/12 1207 08/01/12 0430 08/03/12 0500  AST 14 11 7   ALT 10 10 <5  ALKPHOS 35* 36* 35*  BILITOT 1.2 1.2 0.5  PROT 5.5* 5.2* 4.8*  ALBUMIN 2.6* 2.3* 2.0*   CBC:  Recent Labs Lab 07/31/12 1207 07/31/12 2000 08/01/12 1017 08/02/12 0500 08/03/12 0500 08/04/12 0500  WBC 0.5* 0.4* 0.6* 0.6* 0.6* 0.5*  NEUTROABS 0.4* 0.4*  --   --   --   --   HGB 6.7* 6.4* 7.6* 7.6* 7.7* 7.1*  HCT 20.5* 19.4* 23.0* 23.5* 23.5* 21.8*  MCV 96.2 93.7 93.1 93.3 94.0 94.0  PLT 70* 67* 65* 80* 70* 65*   CBG:  Recent Labs Lab 08/02/12 1701 08/02/12 2111 08/03/12 0835 08/03/12 1148 08/03/12 2116  GLUCAP 139* 134* 116* 110* 124*    Recent Results (from the past 240 hour(s))  CULTURE, BLOOD (ROUTINE X 2)     Status: None   Collection Time    07/31/12 12:23 PM      Result Value Range Status   Specimen Description BLOOD LEFT FOREARM   Final   Special Requests     Final   Value: BOTTLES DRAWN AEROBIC AND ANAEROBIC AEB=12CC ANA=8CC   Culture  Setup Time 08/02/2012 00:26   Final   Culture     Final   Value: STAPHYLOCOCCUS AUREUS     Note: RIFAMPIN AND GENTAMICIN SHOULD NOT BE USED AS SINGLE DRUGS FOR TREATMENT OF STAPH INFECTIONS.     Note: Gram Stain Report Called to,Read Back By and Verified With:  MASON J AT  1115 ON 08/01/2012 BY Ginette Pitman Performed at [facility]   Report Status 08/03/2012 FINAL   Final   Organism ID, Bacteria STAPHYLOCOCCUS AUREUS   Final  CULTURE, BLOOD (ROUTINE X 2)     Status: None   Collection Time    07/31/12 12:27 PM      Result Value Range Status   Specimen Description BLOOD PORTA CATH DRAWN BY RN LT   Final   Special Requests BOTTLES DRAWN AEROBIC AND ANAEROBIC [facility]   Final   Culture  Setup Time 08/02/2012 00:30   Final   Culture     Final   Value: STAPHYLOCOCCUS AUREUS  Note: SUSCEPTIBILITIES PERFORMED ON PREVIOUS CULTURE WITHIN THE LAST 5 DAYS.     Note: Gram Stain Report Called to,Read Back By and Verified With: MASON J AT 1115 ON 08/01/2012 BY Ginette Pitman Performed at [facility]   Report Status 08/03/2012 FINAL   Final  URINE CULTURE     Status: None   Collection Time    07/31/12 12:55 PM      Result Value Range Status   Specimen Description URINE, CLEAN CATCH   Final   Special Requests NONE   Final   Culture  Setup Time 08/01/2012 03:22   Final   Colony Count NO GROWTH   Final   Culture NO GROWTH   Final   Report Status 08/02/2012 FINAL   Final  MRSA PCR SCREENING     Status: None   Collection Time    07/31/12  6:02 PM      Result Value Range Status   MRSA by PCR NEGATIVE  NEGATIVE Final   Comment:            The GeneXpert MRSA Assay (FDA     approved for NASAL specimens     only), is one component of a     comprehensive MRSA colonization     surveillance program. It is not     intended to diagnose MRSA     infection nor to guide or     monitor treatment for     MRSA infections.  CULTURE, BLOOD (ROUTINE X 2)     Status: None   Collection Time    08/02/12  5:25 PM      Result Value Range Status   Specimen Description BLOOD RIGHT ARM   Final   Special Requests BOTTLES DRAWN AEROBIC AND ANAEROBIC 10CC   Final   Culture  Setup Time 08/02/2012 22:29   Final   Culture     Final   Value:        BLOOD CULTURE RECEIVED NO GROWTH TO  DATE CULTURE WILL BE HELD FOR 5 DAYS BEFORE ISSUING A FINAL NEGATIVE REPORT   Report Status PENDING   Incomplete  CULTURE, BLOOD (ROUTINE X 2)     Status: None   Collection Time    08/02/12  5:35 PM      Result Value Range Status   Specimen Description BLOOD RIGHT ARM   Final   Special Requests BOTTLES DRAWN AEROBIC AND ANAEROBIC 10CC   Final   Culture  Setup Time 08/02/2012 22:30   Final   Culture     Final   Value:        BLOOD CULTURE RECEIVED NO GROWTH TO DATE CULTURE WILL BE HELD FOR 5 DAYS BEFORE ISSUING A FINAL NEGATIVE REPORT   Report Status PENDING   Incomplete     Studies:  Recent x-ray studies have been reviewed in detail by the Attending Physician  Scheduled Meds:  Scheduled Meds: . antiseptic oral rinse  15 mL Mouth Rinse q12n4p  .  ceFAZolin (ANCEF) IV  2 g Intravenous Q8H  . chlorhexidine  15 mL Mouth Rinse BID  . insulin aspart  0-9 Units Subcutaneous TID WC  . pantoprazole  40 mg Oral Q1200    Time spent on care of this patient: 35 mins   [facility]  [facility] Office  [phone] Pager - Text Page per Loretha Stapler as per below:  On-Call/Text Page:      [email or web address]      password [email or web address]  If  7PM-7AM, please contact night-coverage [email or web address] Password [email or web address] 08/04/2012, 2:07 PM   LOS: 4 days

## 2012-08-04 NOTE — Progress Notes (Signed)
  Echocardiogram 2D Echocardiogram has been performed.  Georgian Co 08/04/2012, 1:14 PM

## 2012-08-04 NOTE — Progress Notes (Signed)
PT Cancellation Note  Patient Details Name: Alex Petty MRN: [id number] DOB: 1945/03/19   Cancelled Treatment:    Reason Eval/Treat Not Completed: Other (comment) (Pt nauseated & refusing PT at this time.)  Pt just finished rolling in bed after getting cleaned up from vomiting. Pt Hgb also 7.1 at this time.     Joeleen Wortley 08/04/2012, 3:19 PM  Jake Shark, PT DPT [phone]

## 2012-08-04 NOTE — Consult Note (Signed)
I have seen and examined the patient and agree with the assessment and plans.  Ashyia Schraeder A. Lambros Cerro  MD, FACS  

## 2012-08-04 NOTE — Progress Notes (Signed)
ANTIBIOTIC CONSULT NOTE - FOLLOW UP  Pharmacy Consult for Ancef Indication: MSSA bacteremia  Allergies  Allergen Reactions  . Ace Inhibitors Other (See Comments)    cough  . Rocephin (Ceftriaxone Sodium) Hives  . Beta Adrenergic Blockers Rash    Patient Measurements: Height: 5' 8.5" (174 cm) Weight: 326 lb 4.5 oz (148 kg) IBW/kg (Calculated) : 69.55 Adjusted Body Weight:    Vital Signs: Temp: 98 F (36.7 C) (07/25 0730) Temp src: Oral (07/25 0730) BP: 114/69 mmHg (07/25 0730) Pulse Rate: 115 (07/25 0730) Intake/Output from previous day: 07/24 0701 - 07/25 0700 In: 3867.1 [P.O.:720; I.V.:3047.1; IV Piggyback:100] Out: 650 [Urine:650] Intake/Output from this shift: Total I/O In: 250 [I.V.:250] Out: 800 [Urine:800]  Labs:  Recent Labs  08/02/12 0500 08/03/12 0500 08/04/12 0500  WBC 0.6* 0.6* 0.5*  HGB 7.6* 7.7* 7.1*  PLT 80* 70* 65*  CREATININE 2.27* 1.93*  --    Estimated Creatinine Clearance: 53.1 ml/min (by C-G formula based on Cr of 1.93). No results found for this basename: VANCOTROUGH, VANCOPEAK, VANCORANDOM, GENTTROUGH, GENTPEAK, GENTRANDOM, TOBRATROUGH, TOBRAPEAK, TOBRARND, AMIKACINPEAK, AMIKACINTROU, AMIKACIN,  in the last 72 hours   Microbiology: Recent Results (from the past 720 hour(s))  CULTURE, BLOOD (ROUTINE X 2)     Status: None   Collection Time    07/31/12 12:23 PM      Result Value Range Status   Specimen Description BLOOD LEFT FOREARM   Final   Special Requests     Final   Value: BOTTLES DRAWN AEROBIC AND ANAEROBIC AEB=12CC ANA=8CC   Culture  Setup Time 08/02/2012 00:26   Final   Culture     Final   Value: STAPHYLOCOCCUS AUREUS     Note: RIFAMPIN AND GENTAMICIN SHOULD NOT BE USED AS SINGLE DRUGS FOR TREATMENT OF STAPH INFECTIONS.     Note: Gram Stain Report Called to,Read Back By and Verified With:  MASON J AT 1115 ON 08/01/2012 BY Ginette Pitman Performed at [facility]   Report Status 08/03/2012 FINAL   Final   Organism ID,  Bacteria STAPHYLOCOCCUS AUREUS   Final  CULTURE, BLOOD (ROUTINE X 2)     Status: None   Collection Time    07/31/12 12:27 PM      Result Value Range Status   Specimen Description BLOOD PORTA CATH DRAWN BY RN LT   Final   Special Requests BOTTLES DRAWN AEROBIC AND ANAEROBIC [facility]   Final   Culture  Setup Time 08/02/2012 00:30   Final   Culture     Final   Value: STAPHYLOCOCCUS AUREUS     Note: SUSCEPTIBILITIES PERFORMED ON PREVIOUS CULTURE WITHIN THE LAST 5 DAYS.     Note: Gram Stain Report Called to,Read Back By and Verified With: MASON J AT 1115 ON 08/01/2012 BY Ginette Pitman Performed at [facility]   Report Status 08/03/2012 FINAL   Final  URINE CULTURE     Status: None   Collection Time    07/31/12 12:55 PM      Result Value Range Status   Specimen Description URINE, CLEAN CATCH   Final   Special Requests NONE   Final   Culture  Setup Time 08/01/2012 03:22   Final   Colony Count NO GROWTH   Final   Culture NO GROWTH   Final   Report Status 08/02/2012 FINAL   Final  MRSA PCR SCREENING     Status: None   Collection Time    07/31/12  6:02 PM  Result Value Range Status   MRSA by PCR NEGATIVE  NEGATIVE Final   Comment:            The GeneXpert MRSA Assay (FDA     approved for NASAL specimens     only), is one component of a     comprehensive MRSA colonization     surveillance program. It is not     intended to diagnose MRSA     infection nor to guide or     monitor treatment for     MRSA infections.  CULTURE, BLOOD (ROUTINE X 2)     Status: None   Collection Time    08/02/12  5:25 PM      Result Value Range Status   Specimen Description BLOOD RIGHT ARM   Final   Special Requests BOTTLES DRAWN AEROBIC AND ANAEROBIC 10CC   Final   Culture  Setup Time 08/02/2012 22:29   Final   Culture     Final   Value:        BLOOD CULTURE RECEIVED NO GROWTH TO DATE CULTURE WILL BE HELD FOR 5 DAYS BEFORE ISSUING A FINAL NEGATIVE REPORT   Report Status PENDING   Incomplete  CULTURE,  BLOOD (ROUTINE X 2)     Status: None   Collection Time    08/02/12  5:35 PM      Result Value Range Status   Specimen Description BLOOD RIGHT ARM   Final   Special Requests BOTTLES DRAWN AEROBIC AND ANAEROBIC 10CC   Final   Culture  Setup Time 08/02/2012 22:30   Final   Culture     Final   Value:        BLOOD CULTURE RECEIVED NO GROWTH TO DATE CULTURE WILL BE HELD FOR 5 DAYS BEFORE ISSUING A FINAL NEGATIVE REPORT   Report Status PENDING   Incomplete    Anti-infectives   Start     Dose/Rate Route Frequency Ordered Stop   08/02/12 1400  vancomycin (VANCOCIN) 2,000 mg in sodium chloride 0.9 % 500 mL IVPB  Status:  Discontinued     2,000 mg 250 mL/hr over 120 Minutes Intravenous Every 48 hours 07/31/12 2041 08/01/12 1124   08/01/12 1800  vancomycin (VANCOCIN) 2,000 mg in sodium chloride 0.9 % 500 mL IVPB  Status:  Discontinued     2,000 mg 250 mL/hr over 120 Minutes Intravenous Every 24 hours 08/01/12 1124 08/03/12 1433   08/01/12 1730  ceFAZolin (ANCEF) IVPB 2 g/50 mL premix     2 g 100 mL/hr over 30 Minutes Intravenous 3 times per day 08/01/12 1621     08/01/12 1200  acyclovir (ZOVIRAX) 750 mg in dextrose 5 % 150 mL IVPB  Status:  Discontinued     750 mg 165 mL/hr over 60 Minutes Intravenous Every 12 hours 08/01/12 1124 08/01/12 1544   08/01/12 0000  imipenem-cilastatin (PRIMAXIN) 250 mg in sodium chloride 0.9 % 100 mL IVPB  Status:  Discontinued     250 mg 200 mL/hr over 30 Minutes Intravenous 4 times per day 07/31/12 2041 08/01/12 1544   07/31/12 2115  vancomycin (VANCOCIN) IVPB 1000 mg/200 mL premix     1,000 mg 200 mL/hr over 60 Minutes Intravenous NOW 07/31/12 2041 07/31/12 2215   07/31/12 2030  acyclovir (ZOVIRAX) 1,000 mg in dextrose 5 % 150 mL IVPB  Status:  Discontinued     1,000 mg 170 mL/hr over 60 Minutes Intravenous Every 24 hours 07/31/12 1921 08/01/12 1124   07/31/12  2000  micafungin (MYCAMINE) 150 mg in sodium chloride 0.9 % 100 mL IVPB  Status:  Discontinued     150  mg 100 mL/hr over 1 Hours Intravenous Daily 07/31/12 1921 08/01/12 1544   07/31/12 1315  imipenem-cilastatin (PRIMAXIN) 500 mg in sodium chloride 0.9 % 100 mL IVPB     500 mg 200 mL/hr over 30 Minutes Intravenous  Once 07/31/12 1306 07/31/12 1627   07/31/12 1300  vancomycin (VANCOCIN) IVPB 1000 mg/200 mL premix     1,000 mg 200 mL/hr over 60 Minutes Intravenous  Once 07/31/12 1252 07/31/12 1627      Assessment: Admit: Neutropenic fever with septic shock,>> possible sources are Rt thigh vesicular lesions and right foot, s  Anticoagulation: Coumadin PTA for afib. INR 3.02 7/23, Plt 70 - MD ok with holding coumadin for now. Foot pump for VTE Px  Infectious Disease: Broad abx for neutropenic fever/sepsis, now on Cefazolin for MSSA bacteremia. Possible source R thigh vesicular lesions/R foot. PCT 6. LA 3.3->0.7 (7/21) ANC 400. Wbc 0.5. Afeb.  Vanc 7/21 >>7/24 Primaxin 7/21 >>7/22 Acyclovir 7/21 >>7/22 Micafungin 7/21 >7/22 Cefazolin 7/22 >>  Bldx2 7/21 >> MSSA Urine 7/21 >> no growth final 7/21>>MRSA neg 7/23 repeat Bld cultures>>pending  Plan:  - Continue cefazolin 2g Q8h. Dosing ok. - Resume Coumadin when appropriate - Pharmacy will sign off. Please reconsult for further dosing assistance.  Marcy Sookdeo S. Merilynn Finland, PharmD, BCPS Clinical Staff Pharmacist Pager [phone]  Misty Stanley Stillinger 08/04/2012,11:12 AM

## 2012-08-04 NOTE — Progress Notes (Signed)
Patient ID: Alex Petty, male   DOB: [birth year], 67 y.o.   MRN: [id number]         [facility]    Date of Admission:  07/31/2012   Total days of antibiotics 5         Principal Problem:   Bacteremia Active Problems:   Cellulitis of leg, left   Pustular folliculitis   Acute on chronic kidney disease, stage 3   Venous stasis dermatitis   A-fib   CLL (chronic lymphocytic leukemia)   Septic shock   Neutropenic fever   Antineoplastic chemotherapy induced pancytopenia   Acute back pain   . allopurinol  300 mg Oral Daily  . antiseptic oral rinse  15 mL Mouth Rinse q12n4p  .  ceFAZolin (ANCEF) IV  2 g Intravenous Q8H  . chlorhexidine  15 mL Mouth Rinse BID  . cyanocobalamin  1,000 mcg Subcutaneous QAC supper  . [START ON 08/05/2012] doxazosin  4 mg Oral q morning - 10a  . [START ON 08/05/2012] febuxostat  40 mg Oral q morning - 10a  . insulin aspart  0-9 Units Subcutaneous TID WC  . pantoprazole  40 mg Oral Q1200  . phytonadione (VITAMIN K) IV  10 mg Intravenous Once    Subjective: Overall, he is feeling better but he is still having problems with nausea and vomiting. His low back pain is tolerable when he is laying flat but, severe when he tries to sit up. Review of Systems: Pertinent items are noted in HPI.  Past Medical History  Diagnosis Date  . Arthritis   . Hypertension   . DJD (degenerative joint disease)   . DJD (degenerative joint disease), ankle and foot   . Arthritis   . Cellulitis   . Arthritis   . Chronic back pain   . Gout   . Dysrhythmia     afibrillation  . Atrial fibrillation   . Chronic kidney disease   . Colon cancer     2005  . 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 Charcot ankle     dr. Onalee Hua tucker    History  Substance Use Topics  . Smoking status: Never Smoker   . Smokeless tobacco: Never Used  . Alcohol Use: No    Family History  Problem Relation Age of Onset  . Anesthesia problems Neg Hx     . Hypotension Neg Hx   . Malignant hyperthermia Neg Hx   . Pseudochol deficiency Neg Hx   . Breast cancer Mother   . Heart disease Mother     Allergies  Allergen Reactions  . Ace Inhibitors Other (See Comments)    cough  . Rocephin (Ceftriaxone Sodium) Hives  . Beta Adrenergic Blockers Rash    Objective: Temp:  [97.4 F (36.3 C)-98.9 F (37.2 C)] 98.9 F (37.2 C) (07/25 1222) Pulse Rate:  [101-120] 101 (07/25 1222) Resp:  [16-23] 19 (07/25 1222) BP: (106-122)/(68-75) 109/75 mmHg (07/25 1222) SpO2:  [96 %-100 %] 100 % (07/25 1222)  General: He is just had some more coffee ground emesis Skin: Right leg cellulitis and folliculitis is resolving Lungs: Clear Cor: Distant regular S1 and S2 and no murmurs Abdomen: Distended soft and nontender with quiet bowel sounds Port-A-Cath site appears normal  Lab Results Lab Results  Component Value Date   WBC 0.5* 08/04/2012   HGB 7.1* 08/04/2012   HCT 21.8* 08/04/2012   MCV 94.0 08/04/2012   PLT 65* 08/04/2012    Lab  Results  Component Value Date   CREATININE 1.93* 08/03/2012   BUN 62* 08/03/2012   NA 135 08/03/2012   K 4.3 08/03/2012   CL 103 08/03/2012   CO2 23 08/03/2012    Lab Results  Component Value Date   ALT <5 08/03/2012   AST 7 08/03/2012   ALKPHOS 35* 08/03/2012   BILITOT 0.5 08/03/2012      Microbiology: Recent Results (from the past 240 hour(s))  CULTURE, BLOOD (ROUTINE X 2)     Status: None   Collection Time    07/31/12 12:23 PM      Result Value Range Status   Specimen Description BLOOD LEFT FOREARM   Final   Special Requests     Final   Value: BOTTLES DRAWN AEROBIC AND ANAEROBIC AEB=12CC ANA=8CC   Culture  Setup Time 08/02/2012 00:26   Final   Culture     Final   Value: STAPHYLOCOCCUS AUREUS     Note: RIFAMPIN AND GENTAMICIN SHOULD NOT BE USED AS SINGLE DRUGS FOR TREATMENT OF STAPH INFECTIONS.     Note: Gram Stain Report Called to,Read Back By and Verified With:  MASON J AT 1115 ON 08/01/2012 BY Ginette Pitman  Performed at [facility]   Report Status 08/03/2012 FINAL   Final   Organism ID, Bacteria STAPHYLOCOCCUS AUREUS   Final  CULTURE, BLOOD (ROUTINE X 2)     Status: None   Collection Time    07/31/12 12:27 PM      Result Value Range Status   Specimen Description BLOOD PORTA CATH DRAWN BY RN LT   Final   Special Requests BOTTLES DRAWN AEROBIC AND ANAEROBIC [facility]   Final   Culture  Setup Time 08/02/2012 00:30   Final   Culture     Final   Value: STAPHYLOCOCCUS AUREUS     Note: SUSCEPTIBILITIES PERFORMED ON PREVIOUS CULTURE WITHIN THE LAST 5 DAYS.     Note: Gram Stain Report Called to,Read Back By and Verified With: MASON J AT 1115 ON 08/01/2012 BY Ginette Pitman Performed at [facility]   Report Status 08/03/2012 FINAL   Final  URINE CULTURE     Status: None   Collection Time    07/31/12 12:55 PM      Result Value Range Status   Specimen Description URINE, CLEAN CATCH   Final   Special Requests NONE   Final   Culture  Setup Time 08/01/2012 03:22   Final   Colony Count NO GROWTH   Final   Culture NO GROWTH   Final   Report Status 08/02/2012 FINAL   Final  MRSA PCR SCREENING     Status: None   Collection Time    07/31/12  6:02 PM      Result Value Range Status   MRSA by PCR NEGATIVE  NEGATIVE Final   Comment:            The GeneXpert MRSA Assay (FDA     approved for NASAL specimens     only), is one component of a     comprehensive MRSA colonization     surveillance program. It is not     intended to diagnose MRSA     infection nor to guide or     monitor treatment for     MRSA infections.  CULTURE, BLOOD (ROUTINE X 2)     Status: None   Collection Time    08/02/12  5:25 PM      Result  Value Range Status   Specimen Description BLOOD RIGHT ARM   Final   Special Requests BOTTLES DRAWN AEROBIC AND ANAEROBIC 10CC   Final   Culture  Setup Time 08/02/2012 22:29   Final   Culture     Final   Value:        BLOOD CULTURE RECEIVED NO GROWTH TO DATE CULTURE WILL BE HELD FOR 5  DAYS BEFORE ISSUING A FINAL NEGATIVE REPORT   Report Status PENDING   Incomplete  CULTURE, BLOOD (ROUTINE X 2)     Status: None   Collection Time    08/02/12  5:35 PM      Result Value Range Status   Specimen Description BLOOD RIGHT ARM   Final   Special Requests BOTTLES DRAWN AEROBIC AND ANAEROBIC 10CC   Final   Culture  Setup Time 08/02/2012 22:30   Final   Culture     Final   Value:        BLOOD CULTURE RECEIVED NO GROWTH TO DATE CULTURE WILL BE HELD FOR 5 DAYS BEFORE ISSUING A FINAL NEGATIVE REPORT   Report Status PENDING   Incomplete    Studies/Results: Dg Abd Portable 2v  08/03/2012   *RADIOLOGY REPORT*  Clinical Data: Vomiting.  PORTABLE ABDOMEN - 2 VIEW  Comparison: None  Findings: There is diffuse gaseous distention of both large and small bowel.  I suspect represents ileus.  No free air.  No organomegaly or suspicious calcification.  No acute bony abnormality.  IMPRESSION: Diffuse gaseous distention of bowel, likely ileus.   Original Report Authenticated By: Charlett Nose, M.D.    Assessment: So far repeat blood cultures are negative at 48 hours and it appears that his MSSA bacteremia is beginning to respond to antibiotic therapy. Transthoracic echocardiogram results are pending. Surgery is planned to remove his Port-A-Cath tomorrow.  Plan: 1. Continue cefazolin 2. Port-A-Cath removal 3. Await results of repeat blood cultures and TTE 4. Consider lumbar MRI next week  Cliffton Asters, MD [facility] [phone] pager   [phone] cell 08/04/2012, 4:31 PM

## 2012-08-04 NOTE — Progress Notes (Signed)
Pt is refusing to wear CPAP. RT made pt aware that if he changes his mind to please call we will be here all night.

## 2012-08-04 NOTE — Consult Note (Signed)
Reason for Consult:infected port-a-cath Referring Physician: Dr. 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  He presented to [facility] ED on Monday due to weakness and lethargy for 1 week.  Associated with vomiting, confusion.  Prior to onset of symptoms, he began chemotherapy and also received a blood transfusion.  He was to have hypotension, fevers, neutropenia, anemia and tachycardia.  A central line was placed, he was given IVF and transferred to [facility] ICU under Critical Care.  He was started on antibiotics, oncology and infectious disease were consulted.  His blood cultures showed MSSA bacteremia.  We have been consulted for the removal of Port-A-Cath which was placed 07/11/12 at the same time as his lymph node biopsy by Dr. Malvin Johns.    His INR has been supra-therapeutic.  His warfarin has been on hold.  He developed coffee ground emesis yesterday, today spitting up bright red blood, very little.  Past Medical History  Diagnosis Date  . Arthritis   . Hypertension   . DJD (degenerative joint disease)   . DJD (degenerative joint disease), ankle and foot   . Arthritis   . Cellulitis   . Arthritis   . Chronic back pain   . Gout   . Dysrhythmia     afibrillation  . Atrial fibrillation   . Chronic kidney disease   . Colon cancer     2005  . 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 Foot surgery      right foot-  . Colon surgery      forcolon cancer, 2005, Dr. Marcell Anger in [place]  . Tonsillectomy    . Cystoscopy with urethral dilatation    . Back surgery      x2  . Cataract extraction w/phaco  01/14/2011    Procedure: CATARACT EXTRACTION PHACO AND INTRAOCULAR LENS PLACEMENT (IOC);  Surgeon: Gemma Payor;  Location: [facility]  ORS;  Service: Ophthalmology;  Laterality: Left;  CDE=16.27  . Lymph node biopsy Right 07/11/2012    Procedure: CERVICAL LYMPH NODE BIOPSY;  Surgeon: Marlane Hatcher, MD;  Location: [facility] ORS;  Service: General;  Laterality: Right;  end @ 1101  . Portacath placement Right 07/11/2012    Procedure: INSERTION PORT-A-CATH;  Surgeon: Marlane Hatcher, MD;  Location: [facility] ORS;  Service: General;  Laterality: Right;    Family History  Problem Relation Age of Onset  . Anesthesia problems Neg Hx   . Hypotension Neg Hx   . Malignant hyperthermia Neg Hx   . Pseudochol deficiency Neg Hx   . Breast cancer Mother   . Heart disease Mother     Social History:  reports that he has never smoked. He has never used smokeless tobacco. He reports that he does not drink alcohol or use illicit drugs.  Allergies:  Allergies  Allergen Reactions  . Ace Inhibitors Other (See Comments)    cough  . Rocephin (Ceftriaxone Sodium) Hives  . Beta Adrenergic Blockers Rash    Medications: medication reviewed  Results for orders placed during the hospital encounter of 07/31/12 (from the past 48 hour(s))  GLUCOSE, CAPILLARY     Status: Abnormal   Collection Time    08/02/12  5:01 PM      Result Value Range   Glucose-Capillary 139 (*)  70 - 99 mg/dL  CULTURE, BLOOD (ROUTINE X 2)     Status: None   Collection Time    08/02/12  5:25 PM      Result Value Range   Specimen Description BLOOD RIGHT ARM     Special Requests BOTTLES DRAWN AEROBIC AND ANAEROBIC 10CC     Culture  Setup Time 08/02/2012 22:29     Culture       Value:        BLOOD CULTURE RECEIVED NO GROWTH TO DATE CULTURE WILL BE HELD FOR 5 DAYS BEFORE ISSUING A FINAL NEGATIVE REPORT   Report Status PENDING    CULTURE, BLOOD (ROUTINE X 2)     Status: None   Collection Time    08/02/12  5:35 PM      Result Value Range   Specimen Description BLOOD RIGHT ARM     Special Requests BOTTLES DRAWN AEROBIC AND ANAEROBIC 10CC     Culture  Setup Time 08/02/2012  22:30     Culture       Value:        BLOOD CULTURE RECEIVED NO GROWTH TO DATE CULTURE WILL BE HELD FOR 5 DAYS BEFORE ISSUING A FINAL NEGATIVE REPORT   Report Status PENDING    GLUCOSE, CAPILLARY     Status: Abnormal   Collection Time    08/02/12  9:11 PM      Result Value Range   Glucose-Capillary 134 (*) 70 - 99 mg/dL   Comment 1 Notify RN    CBC     Status: Abnormal   Collection Time    08/03/12  5:00 AM      Result Value Range   WBC 0.6 (*) 4.0 - 10.5 K/uL   Comment: CRITICAL VALUE NOTED.  VALUE IS CONSISTENT WITH PREVIOUSLY REPORTED AND CALLED VALUE.   RBC 2.50 (*) 4.22 - 5.81 MIL/uL   Hemoglobin 7.7 (*) 13.0 - 17.0 g/dL   HCT 64.3 (*) 32.9 - 51.8 %   MCV 94.0  78.0 - 100.0 fL   MCH 30.8  26.0 - 34.0 pg   MCHC 32.8  30.0 - 36.0 g/dL   RDW 84.1 (*) 66.0 - 63.0 %   Platelets 70 (*) 150 - 400 K/uL   Comment: CONSISTENT WITH PREVIOUS RESULT  HEMOGLOBIN A1C     Status: None   Collection Time    08/03/12  5:00 AM      Result Value Range   Hemoglobin A1C 5.5  <5.7 %   Comment: (NOTE)                                                                               According to the ADA Clinical Practice Recommendations for 2011, when     HbA1c is used as a screening test:      >=6.5%   Diagnostic of Diabetes Mellitus               (if abnormal result is confirmed)     5.7-6.4%   Increased risk of developing Diabetes Mellitus     References:Diagnosis and Classification of Diabetes Mellitus,Diabetes     Care,2011,34(Suppl 1):S62-S69 and Standards of Medical Care in  Diabetes - 2011,Diabetes Care,2011,34 (Suppl 1):S11-S61.   Mean Plasma Glucose 111  <117 mg/dL  COMPREHENSIVE METABOLIC PANEL     Status: Abnormal   Collection Time    08/03/12  5:00 AM      Result Value Range   Sodium 135  135 - 145 mEq/L   Potassium 4.3  3.5 - 5.1 mEq/L   Chloride 103  96 - 112 mEq/L   CO2 23  19 - 32 mEq/L   Glucose, Bld 130 (*) 70 - 99 mg/dL   BUN 62 (*) 6 - 23 mg/dL   Creatinine,  Ser 4.54 (*) 0.50 - 1.35 mg/dL   Calcium 8.2 (*) 8.4 - 10.5 mg/dL   Total Protein 4.8 (*) 6.0 - 8.3 g/dL   Albumin 2.0 (*) 3.5 - 5.2 g/dL   AST 7  0 - 37 U/L   ALT <5  0 - 53 U/L   Alkaline Phosphatase 35 (*) 39 - 117 U/L   Total Bilirubin 0.5  0.3 - 1.2 mg/dL   GFR calc non Af Amer 34 (*) >90 mL/min   GFR calc Af Amer 40 (*) >90 mL/min   Comment:            The eGFR has been calculated     using the CKD EPI equation.     This calculation has not been     validated in all clinical     situations.     eGFR's persistently     <90 mL/min signify     possible Chronic Kidney Disease.  VITAMIN B12     Status: None   Collection Time    08/03/12  5:00 AM      Result Value Range   Vitamin B-12 276  211 - 911 pg/mL  FOLATE     Status: None   Collection Time    08/03/12  5:00 AM      Result Value Range   Folate 7.8     Comment: (NOTE)     Reference Ranges            Deficient:       0.4 - 3.3 ng/mL            Indeterminate:   3.4 - 5.4 ng/mL            Normal:              > 5.4 ng/mL  GLUCOSE, CAPILLARY     Status: Abnormal   Collection Time    08/03/12  8:35 AM      Result Value Range   Glucose-Capillary 116 (*) 70 - 99 mg/dL   Comment 1 Notify RN    GLUCOSE, CAPILLARY     Status: Abnormal   Collection Time    08/03/12 11:48 AM      Result Value Range   Glucose-Capillary 110 (*) 70 - 99 mg/dL   Comment 1 Notify RN    GLUCOSE, CAPILLARY     Status: Abnormal   Collection Time    08/03/12  9:16 PM      Result Value Range   Glucose-Capillary 124 (*) 70 - 99 mg/dL  CBC     Status: Abnormal   Collection Time    08/04/12  5:00 AM      Result Value Range   WBC 0.5 (*) 4.0 - 10.5 K/uL   Comment: CRITICAL VALUE NOTED.  VALUE IS CONSISTENT WITH PREVIOUSLY REPORTED AND CALLED VALUE.   RBC 2.32 (*)  4.22 - 5.81 MIL/uL   Hemoglobin 7.1 (*) 13.0 - 17.0 g/dL   HCT 16.1 (*) 09.6 - 04.5 %   MCV 94.0  78.0 - 100.0 fL   MCH 30.6  26.0 - 34.0 pg   MCHC 32.6  30.0 - 36.0 g/dL   RDW  40.9 (*) 81.1 - 15.5 %   Platelets 65 (*) 150 - 400 K/uL   Comment: CONSISTENT WITH PREVIOUS RESULT    Dg Abd Portable 2v  08/03/2012   *RADIOLOGY REPORT*  Clinical Data: Vomiting.  PORTABLE ABDOMEN - 2 VIEW  Comparison: None  Findings: There is diffuse gaseous distention of both large and small bowel.  I suspect represents ileus.  No free air.  No organomegaly or suspicious calcification.  No acute bony abnormality.  IMPRESSION: Diffuse gaseous distention of bowel, likely ileus.   Original Report Authenticated By: Charlett Nose, M.D.    Review of Systems  Constitutional: Positive for malaise/fatigue. Negative for chills, weight loss and diaphoresis.  Respiratory: Positive for hemoptysis. Negative for sputum production, shortness of breath and wheezing.   Cardiovascular: Positive for leg swelling. Negative for chest pain, palpitations and PND.  Gastrointestinal: Positive for nausea and vomiting. Negative for abdominal pain, diarrhea, constipation, blood in stool and melena.  Genitourinary: Negative for urgency and hematuria.  Musculoskeletal: Positive for back pain.  Skin: Positive for rash. Negative for itching.  Neurological: Positive for weakness. Negative for focal weakness, seizures and loss of consciousness.   Blood pressure 109/75, pulse 101, temperature 98.9 F (37.2 C), temperature source Oral, resp. rate 19, height 5' 8.5" (1.74 m), weight 326 lb 4.5 oz (148 kg), SpO2 100.00%. Physical Exam  Constitutional: He is oriented to person, place, and time. He appears well-nourished. No distress.  No acute distress  HENT:  Head: Normocephalic and atraumatic.  Cardiovascular: Exam reveals no gallop and no friction rub.   No murmur heard. S1S2 irregularly irregular rhythm, distant heart sounds  Respiratory: Effort normal and breath sounds normal. No respiratory distress. He has no wheezes. He has no rales.  GI: He exhibits no mass. There is no guarding.  Mild diffuse tenderness,  hypoactive bowel sound, obese and mild distended, no masses  Musculoskeletal: He exhibits edema (to BLE ).  Bilateral ankle deformity  Neurological: He is alert and oriented to person, place, and time.  Skin: Skin is warm and dry. He is not diaphoretic.  Chronic peripheral vascular changes to lower extremities RUC PAC without erythema or tenderness Rt IJ catheter   Psychiatric: He has a normal mood and affect. His behavior is normal. Judgment and thought content normal.    Assessment Septic Shock Neutropenia MSSA Bacteremia CLL CKD Atrial fibrillation Anemia Hypertension Obesity Hx colon cancer, resection Anticoagulation therapy, coagulopathy Charcot joint(bilateral ankle)  Plan: -proceed with PAC removal tomorrow morning -obtain consent -NPO after midnight -Primary team is rechecking INR and reversing coumadin if necessary.  Last INR 3.2(7/23).  Pending INR today.  Thank you for allowing [place] to be a part of this patient's care.   Ashok Norris ANP-BC Pager [phone]  08/04/2012, 2:36 PM

## 2012-08-05 ENCOUNTER — Inpatient Hospital Stay (HOSPITAL_COMMUNITY): Payer: Medicare Other | Admitting: Anesthesiology

## 2012-08-05 ENCOUNTER — Encounter (HOSPITAL_COMMUNITY): Payer: Self-pay | Admitting: Anesthesiology

## 2012-08-05 ENCOUNTER — Encounter (HOSPITAL_COMMUNITY)
Admission: EM | Disposition: A | Discharge status: Other Acute Inpatient Hospital | Payer: Self-pay | Source: Home / Self Care | Attending: Internal Medicine

## 2012-08-05 DIAGNOSIS — Z452 Encounter for adjustment and management of vascular access device: Secondary | ICD-10-CM

## 2012-08-05 HISTORY — PX: PORT-A-CATH REMOVAL: SHX5289

## 2012-08-05 LAB — GLUCOSE, CAPILLARY
Glucose-Capillary: 129 mg/dL — ABNORMAL HIGH (ref 70–99)
Glucose-Capillary: 114 mg/dL — ABNORMAL HIGH (ref 70–99)
Glucose-Capillary: 107 mg/dL — ABNORMAL HIGH (ref 70–99)

## 2012-08-05 LAB — COMPREHENSIVE METABOLIC PANEL
ALT: <5 U/L (ref 0–53)
AST: 10 U/L (ref 0–37)
Alkaline Phosphatase: 40 U/L (ref 39–117)
CO2: 20 mEq/L (ref 19–32)
Calcium: 8.1 mg/dL — ABNORMAL LOW (ref 8.4–10.5)
Chloride: 109 mEq/L (ref 96–112)
GFR calc non Af Amer: 38 mL/min — ABNORMAL LOW (ref >90)
Potassium: 4.2 mEq/L (ref 3.5–5.1)
Sodium: 140 mEq/L (ref 135–145)
Total Bilirubin: 1.1 mg/dL (ref 0.3–1.2)

## 2012-08-05 LAB — CBC
HCT: 22.5 % — ABNORMAL LOW (ref 39.0–52.0)
Hemoglobin: 6.6 g/dL — CL (ref 13.0–17.0)
Hemoglobin: 7.5 g/dL — ABNORMAL LOW (ref 13.0–17.0)
MCH: 29.6 pg (ref 26.0–34.0)
MCH: 30.7 pg (ref 26.0–34.0)
MCHC: 31.7 g/dL (ref 30.0–36.0)
MCHC: 33.3 g/dL (ref 30.0–36.0)
RDW: 18.6 % — ABNORMAL HIGH (ref 11.5–15.5)

## 2012-08-05 LAB — APTT: aPTT: 42 seconds — ABNORMAL HIGH (ref 24–37)

## 2012-08-05 LAB — MAGNESIUM: Magnesium: 2 mg/dL (ref 1.5–2.5)

## 2012-08-05 SURGERY — REMOVAL PORT-A-CATH
Anesthesia: General | Site: Chest | Laterality: Right | Wound class: Contaminated

## 2012-08-05 MED ORDER — LIDOCAINE-EPINEPHRINE 1 %-1:100000 IJ SOLN
INTRAMUSCULAR | Status: DC | PRN
  Administered 2012-08-05: 5 mL via INTRADERMAL

## 2012-08-05 MED ORDER — PROPOFOL 10 MG/ML IV BOLUS
INTRAVENOUS | Status: DC | PRN
  Administered 2012-08-05: 30 mg via INTRAVENOUS

## 2012-08-05 MED ORDER — LIDOCAINE-EPINEPHRINE 1 %-1:100000 IJ SOLN
INTRAMUSCULAR | Status: AC
  Filled 2012-08-05: qty 1

## 2012-08-05 MED ORDER — OXYCODONE HCL 5 MG/5ML PO SOLN: 5.0000 mg | Freq: Once | ORAL | Status: DC | PRN

## 2012-08-05 MED ORDER — FENTANYL CITRATE 0.05 MG/ML IJ SOLN: 25.0000 ug | INTRAMUSCULAR | Status: DC | PRN

## 2012-08-05 MED ORDER — SODIUM CHLORIDE 0.9 % IV SOLN
INTRAVENOUS | Status: DC | PRN
  Administered 2012-08-05: 13:00:00 via INTRAVENOUS

## 2012-08-05 MED ORDER — OXYCODONE HCL 5 MG PO TABS: 5.0000 mg | ORAL_TABLET | Freq: Once | ORAL | Status: DC | PRN

## 2012-08-05 SURGICAL SUPPLY — 37 items
ADH SKN CLS APL DERMABOND .7 (GAUZE/BANDAGES/DRESSINGS) ×1
APL SKNCLS STERI-STRIP NONHPOA (GAUZE/BANDAGES/DRESSINGS) ×1
BENZOIN TINCTURE PRP APPL 2/3 (GAUZE/BANDAGES/DRESSINGS) ×2 IMPLANT
BLADE SURG 15 STRL LF DISP TIS (BLADE) ×1 IMPLANT
BLADE SURG 15 STRL SS (BLADE) ×2
CHLORAPREP W/TINT 10.5 ML (MISCELLANEOUS) ×2 IMPLANT
CLOTH BEACON ORANGE TIMEOUT ST (SAFETY) ×2 IMPLANT
COVER SURGICAL LIGHT HANDLE (MISCELLANEOUS) ×2 IMPLANT
DECANTER SPIKE VIAL GLASS SM (MISCELLANEOUS) ×2 IMPLANT
DERMABOND ADVANCED (GAUZE/BANDAGES/DRESSINGS) ×1
DERMABOND ADVANCED .7 DNX12 (GAUZE/BANDAGES/DRESSINGS) IMPLANT
DRAPE PED LAPAROTOMY (DRAPES) ×2 IMPLANT
DRSG TEGADERM 4X4.75 (GAUZE/BANDAGES/DRESSINGS) ×2 IMPLANT
ELECT CAUTERY BLADE 6.4 (BLADE) ×2 IMPLANT
ELECT REM PT RETURN 9FT ADLT (ELECTROSURGICAL) ×2
ELECTRODE REM PT RTRN 9FT ADLT (ELECTROSURGICAL) ×1 IMPLANT
GAUZE SPONGE 2X2 8PLY STRL LF (GAUZE/BANDAGES/DRESSINGS) ×1 IMPLANT
GAUZE SPONGE 4X4 16PLY XRAY LF (GAUZE/BANDAGES/DRESSINGS) ×2 IMPLANT
GLOVE SURG SIGNA 7.5 PF LTX (GLOVE) ×2 IMPLANT
GOWN STRL NON-REIN LRG LVL3 (GOWN DISPOSABLE) ×2 IMPLANT
GOWN STRL REIN XL XLG (GOWN DISPOSABLE) ×2 IMPLANT
KIT BASIN OR (CUSTOM PROCEDURE TRAY) ×2 IMPLANT
KIT ROOM TURNOVER OR (KITS) ×2 IMPLANT
NDL HYPO 25GX1X1/2 BEV (NEEDLE) ×1 IMPLANT
NEEDLE HYPO 25GX1X1/2 BEV (NEEDLE) ×2 IMPLANT
NS IRRIG 1000ML POUR BTL (IV SOLUTION) ×2 IMPLANT
PACK SURGICAL SETUP 50X90 (CUSTOM PROCEDURE TRAY) ×2 IMPLANT
PAD ARMBOARD 7.5X6 YLW CONV (MISCELLANEOUS) ×2 IMPLANT
PENCIL BUTTON HOLSTER BLD 10FT (ELECTRODE) ×2 IMPLANT
SPONGE GAUZE 2X2 STER 10/PKG (GAUZE/BANDAGES/DRESSINGS) ×1
STRIP CLOSURE SKIN 1/2X4 (GAUZE/BANDAGES/DRESSINGS) ×2 IMPLANT
SUT MNCRL AB 4-0 PS2 18 (SUTURE) ×2 IMPLANT
SUT VIC AB 3-0 SH 27 (SUTURE) ×2
SUT VIC AB 3-0 SH 27X BRD (SUTURE) ×1 IMPLANT
SYR CONTROL 10ML LL (SYRINGE) ×2 IMPLANT
TOWEL OR 17X24 6PK STRL BLUE (TOWEL DISPOSABLE) ×2 IMPLANT
TOWEL OR 17X26 10 PK STRL BLUE (TOWEL DISPOSABLE) ×2 IMPLANT

## 2012-08-05 NOTE — Transfer of Care (Signed)
Immediate Anesthesia Transfer of Care Note  Patient: Alex Petty  Procedure(s) Performed: Procedure(s): REMOVAL PORT-A-CATH (Right)  Patient Location: PACU  Anesthesia Type:MAC  Level of Consciousness: awake and patient cooperative  Airway & Oxygen Therapy: Patient Spontanous Breathing and Patient connected to nasal cannula oxygen  Post-op Assessment: Report given to PACU RN, Post -op Vital signs reviewed and stable and Patient moving all extremities  Post vital signs: Reviewed and stable  Complications: No apparent anesthesia complications

## 2012-08-05 NOTE — Progress Notes (Signed)
[facility] Progress Note [place] TEAM 1 - Stepdown/ICU TEAM   Alex Petty:[id number] DOB: 11-Jan-1946 DOA: 07/31/2012 PCP: [name], MD  Brief narrative: 67 yo male started on chemo for CLL (bendamustine/rituxan). He developed rash on right thigh originally thought to be zoster. He was started on zovirax as outpt. He developed progressive weakness to the point he had trouble walking. He was becoming more confused. He had fever to 103F. Family took him to [facility] ED. He was found to be hypotensive, febrile, anemic, and neutropenic. He had CVL placed, was given IV fluid, transfused 1 unit PRBC, and started on ABx. He was transfered to [facility] for further assessment.   SIGNIFICANT EVENTS:  7/21 - Transfer to [facility] on PCCM service, PRBC transfusion  7/23 - [facility] assumed care of pt  7/25 - pt developed hematemesis - INR found to be >6.0 7/25-7/26 - transfused FFP, PRBC, and Vitamin K 10mg  7/26 - to OR for Prot-A-Cath removal  Assessment/Plan:  Neutropenic fever with Septic shock - Staph aureus bacteremia (2/2 cultures +) did not require pressors - lactate cleared - possible sources are Rt thigh lesions and right foot - confirmed to be staph on 7/23 - for removal of Port-A-Cath 7/26 if coags corrected (awaiting f/u PT/INR) - ID directing abx and extent of further w/u required - TTE without acute findings or suggestion of valvular involvement - lumbar MRI to be completed when more stable   Illeus - low grade coffee ground emesis KUB confirmed ileus - suspect pt has suffered mild mallory-weiss tear due to vomiting in setting of coagulopathy, vs/ chemo associated mucositis - recheck INR now s/p Vitamin K 10mg  + 4U FFP - keep npo - may have to consider NG tube for decompression if ileus does not improve - get him up to chair - recheck KUB in AM - keep NPO today   Coagulopathy 2nd to chronic coumadin for A fib INR markedly elevated at 6.74 - has been given Vitamin K 10mg  + 4 U FFP - f/u labs  ordered for last night but still not done as lab delayed them due to PRBC being given - have ordered for STAT this AM - goal is INR 1.4 or <  Pustular folliculitis Cont abx coverage - improving on exam  Venous stasis dermatitis  Cellulitis of leg, left Appears to have resolved w/ abx tx  Pancytopenia 2nd to chemotherapy for CLL neupogen x 1 on 7/21 - awaiting f/u CBC which has not yet been drawn for today   Chronic Afib Rate was poorly controlled, likely reflective of hypovolemia - on IV Cardizem now w/ HR closer to target - transfuse as discussed - keep on IV for today   Hyperglycemia Likely stress response - A1c 5.5 - with all CBG <180 will stop checks  Acute on chronic renal failure baseline cr 2.5 - crt is currently better than his baseline at 1.93  Hyperkalemia resolved  Acute toxic metabolic encephalopathy  2nd to sepsis - waxing and waning - follow clinically - folate is normal - B12 low at 276 so replacement has been ordered - MS overall is improving    Hx of HTN BP well controlled this time/borderline hypotensive - volume resuscitate and follow   Code Status: FULL Family Communication: spoke w/ daughter and wife at bedside Disposition Plan: SDU  Consultants: PCCM >> [facility] ID Gen Surgery   Procedures: Rt IJ CVL 7/21 >>   Antibiotics: Cefazolin 7/22 >> Primaxin 7/21 >> 7/22 Acyclovir 7/21 >>  7/22 Micafungin 7/21 >> 7/22 Vancomycin 7/21 >> 7/24  DVT prophylaxis: SCDs  HPI/Subjective: No more vomiting since yesterday.  Alert and conversant.  No bowel movements.  Mild nausea.  No chest pain or sob.   Objective: Blood pressure 112/69, pulse 108, temperature 97.5 F (36.4 C), temperature source Oral, resp. rate 16, height 5' 8.5" (1.74 m), weight 146 kg (321 lb 14 oz), SpO2 97.00%.  Intake/Output Summary (Last 24 hours) at 08/05/12 0920 Last data filed at 08/05/12 0645  Gross per 24 hour  Intake   3626 ml  Output   2400 ml  Net   1226 ml     Exam: General: No acute respiratory distress at rest in bed on CPAP  Lungs: Clear to auscultation bilaterally without wheezes or crackles - BS are distant Cardiovascular: tachycardic at 105 bpm - no appreciable gallup or M but HS are quite distant Abdomen: morbidly obese, nontender, protuberant, soft, bowel sounds positive but high pitched, no rebound, no ascites, no appreciable mass Extremities: change of chronic venous stasis B - 1+ edema B LE - charcot joints B ankles  Data Reviewed: Basic Metabolic Panel:  Recent Labs Lab 07/31/12 1207 07/31/12 2000 08/01/12 0430 08/02/12 0500 08/03/12 0500  NA 132* 132* 135 136 135  K 5.1 4.9 4.7 4.5 4.3  CL 97 100 101 104 103  CO2 22 23 22 22 23   GLUCOSE 122* 125* 112* 126* 130*  BUN 62* 65* 65* 65* 62*  CREATININE 3.06* 2.96* 2.82* 2.27* 1.93*  CALCIUM 8.5 7.9* 8.1* 8.3* 8.2*   Liver Function Tests:  Recent Labs Lab 07/31/12 1207 08/01/12 0430 08/03/12 0500  AST 14 11 7   ALT 10 10 <5  ALKPHOS 35* 36* 35*  BILITOT 1.2 1.2 0.5  PROT 5.5* 5.2* 4.8*  ALBUMIN 2.6* 2.3* 2.0*   CBC:  Recent Labs Lab 07/31/12 1207 07/31/12 2000 08/01/12 1017 08/02/12 0500 08/03/12 0500 08/04/12 0500  WBC 0.5* 0.4* 0.6* 0.6* 0.6* 0.5*  NEUTROABS 0.4* 0.4*  --   --   --   --   HGB 6.7* 6.4* 7.6* 7.6* 7.7* 7.1*  HCT 20.5* 19.4* 23.0* 23.5* 23.5* 21.8*  MCV 96.2 93.7 93.1 93.3 94.0 94.0  PLT 70* 67* 65* 80* 70* 65*   CBG:  Recent Labs Lab 08/04/12 0729 08/04/12 1221 08/04/12 1732 08/04/12 2212 08/05/12 0817  GLUCAP 119* 129* 172* 117* 117*    Recent Results (from the past 240 hour(s))  CULTURE, BLOOD (ROUTINE X 2)     Status: None   Collection Time    07/31/12 12:23 PM      Result Value Range Status   Specimen Description BLOOD LEFT FOREARM   Final   Special Requests     Final   Value: BOTTLES DRAWN AEROBIC AND ANAEROBIC AEB=12CC ANA=8CC   Culture  Setup Time 08/02/2012 00:26   Final   Culture     Final   Value:  STAPHYLOCOCCUS AUREUS     Note: RIFAMPIN AND GENTAMICIN SHOULD NOT BE USED AS SINGLE DRUGS FOR TREATMENT OF STAPH INFECTIONS.     Note: Gram Stain Report Called to,Read Back By and Verified With:  MASON J AT 1115 ON 08/01/2012 BY Ginette Pitman Performed at [facility]   Report Status 08/03/2012 FINAL   Final   Organism ID, Bacteria STAPHYLOCOCCUS AUREUS   Final  CULTURE, BLOOD (ROUTINE X 2)     Status: None   Collection Time    07/31/12 12:27 PM  Result Value Range Status   Specimen Description BLOOD PORTA CATH DRAWN BY RN LT   Final   Special Requests BOTTLES DRAWN AEROBIC AND ANAEROBIC [facility]   Final   Culture  Setup Time 08/02/2012 00:30   Final   Culture     Final   Value: STAPHYLOCOCCUS AUREUS     Note: SUSCEPTIBILITIES PERFORMED ON PREVIOUS CULTURE WITHIN THE LAST 5 DAYS.     Note: Gram Stain Report Called to,Read Back By and Verified With: MASON J AT 1115 ON 08/01/2012 BY Ginette Pitman Performed at [facility]   Report Status 08/03/2012 FINAL   Final  URINE CULTURE     Status: None   Collection Time    07/31/12 12:55 PM      Result Value Range Status   Specimen Description URINE, CLEAN CATCH   Final   Special Requests NONE   Final   Culture  Setup Time 08/01/2012 03:22   Final   Colony Count NO GROWTH   Final   Culture NO GROWTH   Final   Report Status 08/02/2012 FINAL   Final  MRSA PCR SCREENING     Status: None   Collection Time    07/31/12  6:02 PM      Result Value Range Status   MRSA by PCR NEGATIVE  NEGATIVE Final   Comment:            The GeneXpert MRSA Assay (FDA     approved for NASAL specimens     only), is one component of a     comprehensive MRSA colonization     surveillance program. It is not     intended to diagnose MRSA     infection nor to guide or     monitor treatment for     MRSA infections.  CULTURE, BLOOD (ROUTINE X 2)     Status: None   Collection Time    08/02/12  5:25 PM      Result Value Range Status   Specimen Description BLOOD  RIGHT ARM   Final   Special Requests BOTTLES DRAWN AEROBIC AND ANAEROBIC 10CC   Final   Culture  Setup Time 08/02/2012 22:29   Final   Culture     Final   Value:        BLOOD CULTURE RECEIVED NO GROWTH TO DATE CULTURE WILL BE HELD FOR 5 DAYS BEFORE ISSUING A FINAL NEGATIVE REPORT   Report Status PENDING   Incomplete  CULTURE, BLOOD (ROUTINE X 2)     Status: None   Collection Time    08/02/12  5:35 PM      Result Value Range Status   Specimen Description BLOOD RIGHT ARM   Final   Special Requests BOTTLES DRAWN AEROBIC AND ANAEROBIC 10CC   Final   Culture  Setup Time 08/02/2012 22:30   Final   Culture     Final   Value:        BLOOD CULTURE RECEIVED NO GROWTH TO DATE CULTURE WILL BE HELD FOR 5 DAYS BEFORE ISSUING A FINAL NEGATIVE REPORT   Report Status PENDING   Incomplete     Studies:  Recent x-ray studies have been reviewed in detail by the Attending Physician  Scheduled Meds:  Scheduled Meds: . allopurinol  300 mg Oral Daily  . antiseptic oral rinse  15 mL Mouth Rinse q12n4p  .  ceFAZolin (ANCEF) IV  2 g Intravenous Q8H  . chlorhexidine  15 mL Mouth Rinse BID  .  cyanocobalamin  1,000 mcg Subcutaneous QAC supper  . doxazosin  4 mg Oral q morning - 10a  . febuxostat  40 mg Oral q morning - 10a  . insulin aspart  0-9 Units Subcutaneous TID WC  . pantoprazole  40 mg Oral Q1200    Time spent on care of this patient: 35 mins   [facility] T  [facility] Office  [phone] Pager - Text Page per Loretha Stapler as per below:  On-Call/Text Page:      [email or web address]      password [email or web address]  If 7PM-7AM, please contact night-coverage [email or web address] Password [email or web address] 08/05/2012, 9:20 AM   LOS: 5 days

## 2012-08-05 NOTE — Progress Notes (Signed)
Pt was placed on Auto-CPAP with a nasal mask. 4 LPM oxygen bled in. Pt is comfortable. Vitals are stable.

## 2012-08-05 NOTE — Progress Notes (Signed)
Pt was placed on Auto-CPAP with nasal mask due to low SpO2 while sleeping. Pt has 5 LPM oxygen bled in. Pt is comfortable.

## 2012-08-05 NOTE — Anesthesia Preprocedure Evaluation (Addendum)
Anesthesia Evaluation  Patient identified by MRN, date of birth, ID band Patient awake  General Assessment Comment:Sepsis, infected portacath in place  Reviewed: Allergy & Precautions, H&P , NPO status , Patient's Chart, lab work & pertinent test results, Unable to perform ROS - Chart review only  Airway Mallampati: II  Neck ROM: Limited    Dental  (+) Edentulous Upper and Edentulous Lower   Pulmonary    + decreased breath sounds      Cardiovascular hypertension, + dysrhythmias Rhythm:Irregular Rate:Tachycardia  On Cardiazem gtt   Neuro/Psych    GI/Hepatic   Endo/Other  Morbid obesity  Renal/GU Renal Insufficiency and CRFRenal disease     Musculoskeletal   Abdominal (+) - obese,   Peds  Hematology  (+) Blood dyscrasia, anemia , Hx of CLL   Anesthesia Other Findings   Reproductive/Obstetrics                          Anesthesia Physical Anesthesia Plan  ASA: IV  Anesthesia Plan: MAC   Post-op Pain Management:    Induction: Intravenous  Airway Management Planned: Simple Face Mask and Natural Airway  Additional Equipment:   Intra-op Plan:   Post-operative Plan:   Informed Consent: I have reviewed the patients History and Physical, chart, labs and discussed the procedure including the risks, benefits and alternatives for the proposed anesthesia with the patient or authorized representative who has indicated his/her understanding and acceptance.     Plan Discussed with: CRNA and Surgeon  Anesthesia Plan Comments:         Anesthesia Quick Evaluation

## 2012-08-05 NOTE — Anesthesia Postprocedure Evaluation (Signed)
  Anesthesia Post-op Note  Patient: Alex Petty  Procedure(s) Performed: Procedure(s): REMOVAL PORT-A-CATH (Right)  Patient Location: PACU  Anesthesia Type:MAC  Level of Consciousness: awake and alert   Airway and Oxygen Therapy: Patient Spontanous Breathing  Post-op Pain: none  Post-op Assessment: Post-op Vital signs reviewed  Post-op Vital Signs: stable  Complications: No apparent anesthesia complications

## 2012-08-05 NOTE — Progress Notes (Signed)
Patient ID: Alex Petty, male   DOB: [birth year], 68 y.o.   MRN: [id number]  For OR today once INR corrected. I discussed the risks with the patient and his family in detail

## 2012-08-05 NOTE — Progress Notes (Signed)
CRITICAL VALUE ALERT  Critical value received:  Hemoglobin 6.6;         WBC 0.4;         Platelets 65  Date of notification:  08/05/12  Time of notification:  1035  Critical value read back: YES  Nurse who received alert:  Dellie Catholic RN  MD notified (1st page):  McClung  Time of first page:  1035  MD notified (2nd page):   Time of second page:  Responding MD: Sharon Seller  Time MD responded:  1040

## 2012-08-05 NOTE — Op Note (Signed)
NAMEMCKINLEY, OLHEISER NO.:  [id number]  MEDICAL RECORD NO.:  [id number]  LOCATION:  2612                         FACILITY:  [facility]  PHYSICIAN:  Abigail Miyamoto, M.D. DATE OF BIRTH:  Mar 30, 1945  DATE OF PROCEDURE:  08/05/2012 DATE OF DISCHARGE:                              OPERATIVE REPORT   PREOPERATIVE DIAGNOSIS:  Infected Port-A-Cath.  POSTOPERATIVE DIAGNOSIS:  Infected Port-A-Cath.  PROCEDURE:  Removal of infected Port-A-Cath.  SURGEON:  Abigail Miyamoto, M.D.  ANESTHESIA:  Lidocaine 1% with epinephrine and monitored anesthesia care.  ESTIMATED BLOOD LOSS:  Minimal.  PROCEDURE IN DETAIL:  The patient was brought to the operating room, identified as Alex Petty.  He was placed supine on the operating room table and anesthesia was induced.  His right chest was prepped and draped in the usual sterile fashion.  I anesthetized the previous scar with lidocaine with epinephrine and then opened up the scalpel.  I was able to easily identify the catheter which was pulled up out of the subclavian vein.  I then closed the catheter tract with a 3-0 Vicryl figure-of-eight suture.  I then followed this down to the port which was removed easily from its pocket.  There were no sutures attached to the port.  I then irrigated the wound with saline and achieved hemostasis with cautery.  I closed the subcutaneous tissue with interrupted 3-0 Vicryl sutures and closed the skin with a running 4-0 Monocryl. Dermabond was then applied.  The patient tolerated the procedure well. All the counts were correct at the end of procedure.  The patient was then taken in stable condition from the operating room to the recovery room.     Abigail Miyamoto, M.D.     DB/MEDQ  D:  08/05/2012  T:  08/05/2012  Job:  [id number]

## 2012-08-05 NOTE — Op Note (Signed)
REMOVAL PORT-A-CATH  Procedure Note  Alex Petty 07/31/2012 - 08/05/2012   Pre-op Diagnosis: infected port a cath     Post-op Diagnosis: same  Procedure(s): REMOVAL PORT-A-CATH  Surgeon(s): Shelly Rubenstein, MD  Anesthesia: General  Staff:  Circulator: Lawernce Ion, RN Scrub Person: Illene Bolus, CST Float Surgical Tech: Lajean Silvius Tanner, NT  Estimated Blood Loss: Minimal                         Alex Petty A   Date: 08/05/2012  Time: 1:45 PM

## 2012-08-06 ENCOUNTER — Inpatient Hospital Stay (HOSPITAL_COMMUNITY): Payer: Medicare Other

## 2012-08-06 LAB — PREPARE FRESH FROZEN PLASMA
Unit division: 0
Unit division: 0
Unit division: 0

## 2012-08-06 LAB — BASIC METABOLIC PANEL
BUN: 59 mg/dL — ABNORMAL HIGH (ref 6–23)
CO2: 23 mEq/L (ref 19–32)
Chloride: 110 mEq/L (ref 96–112)
Creatinine, Ser: 1.72 mg/dL — ABNORMAL HIGH (ref 0.50–1.35)
Glucose, Bld: 116 mg/dL — ABNORMAL HIGH (ref 70–99)

## 2012-08-06 LAB — CBC
HCT: 23.3 % — ABNORMAL LOW (ref 39.0–52.0)
MCV: 92.8 fL (ref 78.0–100.0)
RDW: 18.4 % — ABNORMAL HIGH (ref 11.5–15.5)
WBC: 0.6 10*3/uL — CL (ref 4.0–10.5)

## 2012-08-06 LAB — TYPE AND SCREEN
ABO/RH(D): A POS
Antibody Screen: NEGATIVE
Unit division: 0

## 2012-08-06 MED ORDER — FLEET ENEMA 7-19 GM/118ML RE ENEM
1.0000 | ENEMA | Freq: Once | RECTAL | Status: AC
  Administered 2012-08-06: 1 via RECTAL
  Filled 2012-08-06: qty 1

## 2012-08-06 MED ORDER — PROMETHAZINE HCL 25 MG/ML IJ SOLN
12.5000 mg | Freq: Four times a day (QID) | INTRAMUSCULAR | Status: DC | PRN
  Administered 2012-08-06 (×2): 12.5 mg via INTRAVENOUS
  Filled 2012-08-06 (×2): qty 1

## 2012-08-06 MED ORDER — MORPHINE SULFATE 2 MG/ML IJ SOLN
2.0000 mg | INTRAMUSCULAR | Status: DC | PRN
  Administered 2012-08-06: 4 mg via INTRAVENOUS
  Administered 2012-08-07 – 2012-08-16 (×4): 2 mg via INTRAVENOUS
  Filled 2012-08-06: qty 1
  Filled 2012-08-06: qty 2
  Filled 2012-08-06 (×3): qty 1

## 2012-08-06 MED ORDER — PANTOPRAZOLE SODIUM 40 MG IV SOLR
40.0000 mg | INTRAVENOUS | Status: DC
  Administered 2012-08-06 – 2012-08-09 (×3): 40 mg via INTRAVENOUS
  Filled 2012-08-06 (×5): qty 40

## 2012-08-06 MED ORDER — ONDANSETRON HCL 4 MG/2ML IJ SOLN
4.0000 mg | INTRAMUSCULAR | Status: DC | PRN
  Administered 2012-08-06 (×3): 4 mg via INTRAVENOUS
  Filled 2012-08-06 (×3): qty 2

## 2012-08-06 MED ORDER — ACETAMINOPHEN 650 MG RE SUPP: 650.0000 mg | RECTAL | Status: DC | PRN

## 2012-08-06 NOTE — Progress Notes (Signed)
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 He developed rash on right thigh originally thought to be zoster. He was started on zovirax as outpt. He developed progressive weakness to the point he had trouble walking. He was becoming more confused. He had fever to 103F. Family took him to [facility]. He was found to be hypotensive, febrile, anemic, and neutropenic. He had CVL placed, was given IV fluid, transfused 1 unit PRBC, and started on ABx. He was transfered to [facility] for further assessment.   SIGNIFICANT EVENTS:  7/21 - Transfer to [facility] on PCCM service, PRBC transfusion  7/23 - [facility] assumed care of pt  7/25 - pt developed hematemesis - INR found to be >6.0 7/25-7/26 - transfused FFP, PRBC, and Vitamin K 10mg  7/26 - to OR for Port-A-Cath removal 7/27 - NG placed  Assessment/Plan:  Neutropenic fever with Septic shock - Staph aureus bacteremia (2/2 cultures +) did not require pressors - lactate cleared - possible sources are Rt thigh lesions and right foot - confirmed to be staph on 7/23 - Port-A-Cath removed 7/26 - ID directing abx and extent of further w/u required - TTE without acute findings or suggestion of valvular involvement - lumbar MRI to be completed when more stable   Illeus - low grade coffee ground emesis KUB confirmed ileus - suspect pt has suffered mild mallory-weiss tear due to vomiting in setting of coagulopathy, vs/ chemo associated mucositis - keep npo - NG tube for decompression - get him up to chair    Coagulopathy 2nd to chronic coumadin for A fib INR markedly elevated at 6.74 - was given Vitamin K 10mg  + 4 U FFP for reversal - will plan to resume anticoag w/ lovenox in AM if no bleeding encountered once NG placed today   Pustular folliculitis Cont abx coverage - improving on  exam  Venous stasis dermatitis  Cellulitis of leg, left Appears to have resolved w/ abx tx  Pancytopenia 2nd to chemotherapy for CLL neupogen x 1 on 7/21 - WBC appears to be slowly improving   Chronic Afib Rate was poorly controlled, likely reflective of hypovolemia - cont IV Cardizem while npo  Hyperglycemia Likely stress response - A1c 5.5   Acute on chronic renal failure baseline cr 2.5 - crt is currently better than his baseline  Hyperkalemia resolved  Acute toxic metabolic encephalopathy  2nd to sepsis - waxing and waning - follow clinically - folate is normal - B12 low at 276 so replacement has been ordered - MS overall continues to improve    Hx of HTN BP well controlled at this time/borderline hypotensive - follow   Code Status: FULL Family Communication: spoke w/ daughter and wife at bedside Disposition Plan: SDU  Consultants: PCCM >> [facility] ID Gen Surgery   Procedures: Rt IJ CVL 7/21 >>   Antibiotics: Cefazolin 7/22 >> Primaxin 7/21 >> 7/22 Acyclovir 7/21 >> 7/22 Micafungin 7/21 >> 7/22 Vancomycin 7/21 >> 7/24  DVT prophylaxis: SCDs  HPI/Subjective: Vomiting multiple times th/o the day, w/ bilious emesis.  Abdom remains distended.  He denies cp, sob, or ha.  Objective: Blood pressure 114/80, pulse 103, temperature 98.5 F (36.9 C), temperature source Oral, resp. rate 13, height 5' 8.5" (1.74 m), weight 150 kg (330 lb 11 oz), SpO2 95.00%.  Intake/Output Summary (Last 24 hours) at 08/06/12  1250 Last data filed at 08/06/12 1200  Gross per 24 hour  Intake   2572 ml  Output   2775 ml  Net   -203 ml    Exam: General: No acute respiratory distress at rest in bed  Lungs: Clear to auscultation bilaterally without wheezes or crackles - BS are distant Cardiovascular: tachycardic - no appreciable gallup or M but HS are quite distant Abdomen: morbidly obese, nontender, protuberant, soft, bowel sounds high pitched, no rebound, no ascites, no appreciable  mass Extremities: change of chronic venous stasis B - 1+ edema B LE - charcot joints B ankles  Data Reviewed: Basic Metabolic Panel:  Recent Labs Lab 08/01/12 0430 08/02/12 0500 08/03/12 0500 08/05/12 0936 08/06/12 0558  NA 135 136 135 140 141  K 4.7 4.5 4.3 4.2 4.1  CL 101 104 103 109 110  CO2 22 22 23 20 23   GLUCOSE 112* 126* 130* 118* 116*  BUN 65* 65* 62* 64* 59*  CREATININE 2.82* 2.27* 1.93* 1.78* 1.72*  CALCIUM 8.1* 8.3* 8.2* 8.1* 8.3*  MG  --   --   --  2.0  --   PHOS  --   --   --  3.0  --    Liver Function Tests:  Recent Labs Lab 07/31/12 1207 08/01/12 0430 08/03/12 0500 08/05/12 0936  AST 14 11 7 10   ALT 10 10 <5 <5  ALKPHOS 35* 36* 35* 40  BILITOT 1.2 1.2 0.5 1.1  PROT 5.5* 5.2* 4.8* 4.6*  ALBUMIN 2.6* 2.3* 2.0* 1.9*   CBC:  Recent Labs Lab 07/31/12 1207 07/31/12 2000  08/03/12 0500 08/04/12 0500 08/05/12 0936 08/05/12 1635 08/06/12 0558  WBC 0.5* 0.4*  < > 0.6* 0.5* 0.4* 0.5* 0.6*  NEUTROABS 0.4* 0.4*  --   --   --   --   --   --   HGB 6.7* 6.4*  < > 7.7* 7.1* 6.6* 7.5* 7.7*  HCT 20.5* 19.4*  < > 23.5* 21.8* 20.8* 22.5* 23.3*  MCV 96.2 93.7  < > 94.0 94.0 93.3 92.2 92.8  PLT 70* 67*  < > 70* 65* 65* 66* 74*  < > = values in this interval not displayed. CBG:  Recent Labs Lab 08/04/12 2212 08/05/12 0817 08/05/12 1131 08/05/12 1704 08/05/12 2120  GLUCAP 117* 117* 114* 104* 107*    Recent Results (from the past 240 hour(s))  CULTURE, BLOOD (ROUTINE X 2)     Status: None   Collection Time    07/31/12 12:23 PM      Result Value Range Status   Specimen Description BLOOD LEFT FOREARM   Final   Special Requests     Final   Value: BOTTLES DRAWN AEROBIC AND ANAEROBIC AEB=12CC ANA=8CC   Culture  Setup Time 08/02/2012 00:26   Final   Culture     Final   Value: STAPHYLOCOCCUS AUREUS     Note: RIFAMPIN AND GENTAMICIN SHOULD NOT BE USED AS SINGLE DRUGS FOR TREATMENT OF STAPH INFECTIONS.     Note: Gram Stain Report Called to,Read Back By and  Verified With:  MASON J AT 1115 ON 08/01/2012 BY Ginette Pitman Performed at [facility]   Report Status 08/03/2012 FINAL   Final   Organism ID, Bacteria STAPHYLOCOCCUS AUREUS   Final  CULTURE, BLOOD (ROUTINE X 2)     Status: None   Collection Time    07/31/12 12:27 PM      Result Value Range Status   Specimen Description BLOOD  PORTA CATH DRAWN BY RN LT   Final   Special Requests BOTTLES DRAWN AEROBIC AND ANAEROBIC [facility]   Final   Culture  Setup Time 08/02/2012 00:30   Final   Culture     Final   Value: STAPHYLOCOCCUS AUREUS     Note: SUSCEPTIBILITIES PERFORMED ON PREVIOUS CULTURE WITHIN THE LAST 5 DAYS.     Note: Gram Stain Report Called to,Read Back By and Verified With: MASON J AT 1115 ON 08/01/2012 BY Ginette Pitman Performed at [facility]   Report Status 08/03/2012 FINAL   Final  URINE CULTURE     Status: None   Collection Time    07/31/12 12:55 PM      Result Value Range Status   Specimen Description URINE, CLEAN CATCH   Final   Special Requests NONE   Final   Culture  Setup Time 08/01/2012 03:22   Final   Colony Count NO GROWTH   Final   Culture NO GROWTH   Final   Report Status 08/02/2012 FINAL   Final  MRSA PCR SCREENING     Status: None   Collection Time    07/31/12  6:02 PM      Result Value Range Status   MRSA by PCR NEGATIVE  NEGATIVE Final   Comment:            The GeneXpert MRSA Assay (FDA     approved for NASAL specimens     only), is one component of a     comprehensive MRSA colonization     surveillance program. It is not     intended to diagnose MRSA     infection nor to guide or     monitor treatment for     MRSA infections.  CULTURE, BLOOD (ROUTINE X 2)     Status: None   Collection Time    08/02/12  5:25 PM      Result Value Range Status   Specimen Description BLOOD RIGHT ARM   Final   Special Requests BOTTLES DRAWN AEROBIC AND ANAEROBIC 10CC   Final   Culture  Setup Time 08/02/2012 22:29   Final   Culture     Final   Value:        BLOOD  CULTURE RECEIVED NO GROWTH TO DATE CULTURE WILL BE HELD FOR 5 DAYS BEFORE ISSUING A FINAL NEGATIVE REPORT   Report Status PENDING   Incomplete  CULTURE, BLOOD (ROUTINE X 2)     Status: None   Collection Time    08/02/12  5:35 PM      Result Value Range Status   Specimen Description BLOOD RIGHT ARM   Final   Special Requests BOTTLES DRAWN AEROBIC AND ANAEROBIC 10CC   Final   Culture  Setup Time 08/02/2012 22:30   Final   Culture     Final   Value:        BLOOD CULTURE RECEIVED NO GROWTH TO DATE CULTURE WILL BE HELD FOR 5 DAYS BEFORE ISSUING A FINAL NEGATIVE REPORT   Report Status PENDING   Incomplete     Studies:  Recent x-ray studies have been reviewed in detail by the Attending Physician  Scheduled Meds:  Scheduled Meds: . antiseptic oral rinse  15 mL Mouth Rinse q12n4p  .  ceFAZolin (ANCEF) IV  2 g Intravenous Q8H  . chlorhexidine  15 mL Mouth Rinse BID  . cyanocobalamin  1,000 mcg Subcutaneous QAC supper  . doxazosin  4 mg Oral q  morning - 10a  . febuxostat  40 mg Oral q morning - 10a  . pantoprazole  40 mg Oral Q1200    Time spent on care of this patient: 35 mins   [facility] T  [facility] Office  [phone] Pager - Text Page per Loretha Stapler as per below:  On-Call/Text Page:      [email or web address]      password [email or web address]  If 7PM-7AM, please contact night-coverage [email or web address] Password [facility] 08/06/2012, 12:50 PM   LOS: 6 days

## 2012-08-06 NOTE — Progress Notes (Signed)
Resp Care Note; Unable to put  pt on CPAP due to Nasal gastric tube in place.

## 2012-08-06 NOTE — Progress Notes (Signed)
Patient ID: Alex Petty, male   DOB: 1945/03/18, 67 y.o.   MRN: [id number]  Right subclavian vein port removal 7/26. Incision healing well; small amount of bruising No sign of infection or hematoma.  Will sign off for now.  Call us if needed.  Wilmon Arms. Corliss Skains, MD, [facility]  General/ Trauma Surgery  08/06/2012 12:06 PM

## 2012-08-06 NOTE — Progress Notes (Signed)
Patient ID: Alex Petty, male   DOB: 1945-03-11, 67 y.o.   MRN: [id number]         [facility]    Date of Admission:  07/31/2012   Total days of antibiotics 7         Principal Problem:   Bacteremia Active Problems:   Cellulitis of leg, left   Pustular folliculitis   Acute on chronic kidney disease, stage 3   Venous stasis dermatitis   A-fib   CLL (chronic lymphocytic leukemia)   Septic shock   Neutropenic fever   Antineoplastic chemotherapy induced pancytopenia   Acute back pain   . antiseptic oral rinse  15 mL Mouth Rinse q12n4p  .  ceFAZolin (ANCEF) IV  2 g Intravenous Q8H  . chlorhexidine  15 mL Mouth Rinse BID  . cyanocobalamin  1,000 mcg Subcutaneous QAC supper  . doxazosin  4 mg Oral q morning - 10a  . febuxostat  40 mg Oral q morning - 10a  . pantoprazole  40 mg Oral Q1200    Subjective: He still bothered by nausea and vomiting. He has not tried to sit up recently do to his back pain.  Objective: Temp:  [97.5 F (36.4 C)-99.2 F (37.3 C)] 98.5 F (36.9 C) (07/27 1200) Pulse Rate:  [42-111] 111 (07/27 1300) Resp:  [10-27] 17 (07/27 1300) BP: (103-127)/(63-80) 114/80 mmHg (07/27 1200) SpO2:  [92 %-99 %] 98 % (07/27 1300) Weight:  [150 kg (330 lb 11 oz)] 150 kg (330 lb 11 oz) (07/27 0410)  General: He is alert but slightly confused. His wife and daughter are at the bedside. Skin: No rash Lungs: Clear Cor: Regular S1 and S2 no murmurs. Dressing over her former Port-A-Cath site is clean and dry Abdomen: Obese, soft and nontender with absent bowel sounds   Lab Results Lab Results  Component Value Date   WBC 0.6* 08/06/2012   HGB 7.7* 08/06/2012   HCT 23.3* 08/06/2012   MCV 92.8 08/06/2012   PLT 74* 08/06/2012    Lab Results  Component Value Date   CREATININE 1.72* 08/06/2012   BUN 59* 08/06/2012   NA 141 08/06/2012   K 4.1 08/06/2012   CL 110 08/06/2012   CO2 23 08/06/2012    Lab Results  Component Value Date   ALT <5  08/05/2012   AST 10 08/05/2012   ALKPHOS 40 08/05/2012   BILITOT 1.1 08/05/2012      Microbiology: Recent Results (from the past 240 hour(s))  CULTURE, BLOOD (ROUTINE X 2)     Status: None   Collection Time    07/31/12 12:23 PM      Result Value Range Status   Specimen Description BLOOD LEFT FOREARM   Final   Special Requests     Final   Value: BOTTLES DRAWN AEROBIC AND ANAEROBIC AEB=12CC ANA=8CC   Culture  Setup Time 08/02/2012 00:26   Final   Culture     Final   Value: STAPHYLOCOCCUS AUREUS     Note: RIFAMPIN AND GENTAMICIN SHOULD NOT BE USED AS SINGLE DRUGS FOR TREATMENT OF STAPH INFECTIONS.     Note: Gram Stain Report Called to,Read Back By and Verified With:  MASON J AT 1115 ON 08/01/2012 BY Ginette Pitman Performed at [facility]   Report Status 08/03/2012 FINAL   Final   Organism ID, Bacteria STAPHYLOCOCCUS AUREUS   Final  CULTURE, BLOOD (ROUTINE X 2)     Status: None   Collection Time  07/31/12 12:27 PM      Result Value Range Status   Specimen Description BLOOD PORTA CATH DRAWN BY RN LT   Final   Special Requests BOTTLES DRAWN AEROBIC AND ANAEROBIC [facility]   Final   Culture  Setup Time 08/02/2012 00:30   Final   Culture     Final   Value: STAPHYLOCOCCUS AUREUS     Note: SUSCEPTIBILITIES PERFORMED ON PREVIOUS CULTURE WITHIN THE LAST 5 DAYS.     Note: Gram Stain Report Called to,Read Back By and Verified With: MASON J AT 1115 ON 08/01/2012 BY Ginette Pitman Performed at [facility]   Report Status 08/03/2012 FINAL   Final  URINE CULTURE     Status: None   Collection Time    07/31/12 12:55 PM      Result Value Range Status   Specimen Description URINE, CLEAN CATCH   Final   Special Requests NONE   Final   Culture  Setup Time 08/01/2012 03:22   Final   Colony Count NO GROWTH   Final   Culture NO GROWTH   Final   Report Status 08/02/2012 FINAL   Final  MRSA PCR SCREENING     Status: None   Collection Time    07/31/12  6:02 PM      Result Value Range Status   MRSA  by PCR NEGATIVE  NEGATIVE Final   Comment:            The GeneXpert MRSA Assay (FDA     approved for NASAL specimens     only), is one component of a     comprehensive MRSA colonization     surveillance program. It is not     intended to diagnose MRSA     infection nor to guide or     monitor treatment for     MRSA infections.  CULTURE, BLOOD (ROUTINE X 2)     Status: None   Collection Time    08/02/12  5:25 PM      Result Value Range Status   Specimen Description BLOOD RIGHT ARM   Final   Special Requests BOTTLES DRAWN AEROBIC AND ANAEROBIC 10CC   Final   Culture  Setup Time 08/02/2012 22:29   Final   Culture     Final   Value:        BLOOD CULTURE RECEIVED NO GROWTH TO DATE CULTURE WILL BE HELD FOR 5 DAYS BEFORE ISSUING A FINAL NEGATIVE REPORT   Report Status PENDING   Incomplete  CULTURE, BLOOD (ROUTINE X 2)     Status: None   Collection Time    08/02/12  5:35 PM      Result Value Range Status   Specimen Description BLOOD RIGHT ARM   Final   Special Requests BOTTLES DRAWN AEROBIC AND ANAEROBIC 10CC   Final   Culture  Setup Time 08/02/2012 22:30   Final   Culture     Final   Value:        BLOOD CULTURE RECEIVED NO GROWTH TO DATE CULTURE WILL BE HELD FOR 5 DAYS BEFORE ISSUING A FINAL NEGATIVE REPORT   Report Status PENDING   Incomplete   Assessment: His MSSA bacteremia is responding to antibiotic therapy and repeat blood cultures are negative. He had no evidence of endocarditis by transthoracic echocardiogram. Given his severe, acute back pain very concerned about the possibility of lumbar infection. Ideally we would have an MRI of his lumbar spine but I'm not sure  he can tolerate it or if it in the scanner. I doubt that to the results of a transesophageal echocardiogram will change his management or outcome. For right now I will plan on continuing antibiotic therapy for a minimum of 6 weeks.  Plan: 1. Continues cefazolin at least 5 more weeks  Cliffton Asters, MD [facility] [place] [facility] [phone] pager   [phone] cell 08/06/2012, 1:55 PM

## 2012-08-07 ENCOUNTER — Inpatient Hospital Stay (HOSPITAL_COMMUNITY): Payer: Medicare Other

## 2012-08-07 ENCOUNTER — Other Ambulatory Visit (HOSPITAL_COMMUNITY): Payer: Medicare Other

## 2012-08-07 LAB — CBC
HCT: 23.3 % — ABNORMAL LOW (ref 39.0–52.0)
Hemoglobin: 7.5 g/dL — ABNORMAL LOW (ref 13.0–17.0)
RBC: 2.48 MIL/uL — ABNORMAL LOW (ref 4.22–5.81)
WBC: 0.8 10*3/uL — CL (ref 4.0–10.5)

## 2012-08-07 LAB — GLUCOSE, CAPILLARY: Glucose-Capillary: 124 mg/dL — ABNORMAL HIGH (ref 70–99)

## 2012-08-07 MED ORDER — ENOXAPARIN SODIUM 150 MG/ML ~~LOC~~ SOLN
150.0000 mg | Freq: Two times a day (BID) | SUBCUTANEOUS | Status: DC
  Administered 2012-08-07 – 2012-08-14 (×14): 150 mg via SUBCUTANEOUS
  Filled 2012-08-07 (×17): qty 1

## 2012-08-07 MED ORDER — SODIUM CHLORIDE 0.9 % IJ SOLN
10.0000 mL | Freq: Two times a day (BID) | INTRAMUSCULAR | Status: DC
  Administered 2012-08-07: 10 mL
  Administered 2012-08-08: 20 mL
  Administered 2012-08-08: 10 mL
  Administered 2012-08-09: 40 mL
  Administered 2012-08-10 – 2012-08-17 (×11): 10 mL

## 2012-08-07 MED ORDER — SODIUM CHLORIDE 0.9 % IJ SOLN
10.0000 mL | INTRAMUSCULAR | Status: DC | PRN
  Administered 2012-08-10 – 2012-08-11 (×2): 10 mL

## 2012-08-07 MED ORDER — BISACODYL 10 MG RE SUPP
10.0000 mg | Freq: Every day | RECTAL | Status: DC
  Administered 2012-08-08: 10 mg via RECTAL
  Filled 2012-08-07: qty 1

## 2012-08-07 NOTE — Progress Notes (Signed)
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 He developed rash on right thigh originally thought to be zoster. He was started on zovirax as outpt. He developed progressive weakness to the point he had trouble walking. He was becoming more confused. He had fever to 103F. Family took him to [facility] ED. He was found to be hypotensive, febrile, anemic, and neutropenic. He had CVL placed, was given IV fluid, transfused 1 unit PRBC, and started on ABx. He was transfered to [facility] for further assessment.   While at [facility], the pt has been diagnosed w/ Staph bacteremia.  As a result, his Port-A-Cath has been removed.  He has also required tx for an ileus.    SIGNIFICANT EVENTS:  7/21 - Transfer to [facility] on PCCM service, PRBC transfusion  7/23 - [facility] assumed care of pt  7/25 - pt developed hematemesis - INR found to be >6.0 7/25-7/26 - transfused FFP, PRBC, and Vitamin K 10mg  7/26 - to OR for Port-A-Cath removal 7/27 - NG placed  Assessment/Plan:  Neutropenic fever with Septic shock - Staph aureus bacteremia (2/2 cultures +) did not require pressors - lactate cleared - possible sources are Rt thigh lesions and right foot - confirmed to be staph on 7/23 - Port-A-Cath removed 7/26 - ID directing abx and extent of further w/u required - TTE without acute findings or suggestion of valvular involvement - lumbar MRI to be completed when more stable (hopefully over the next 2-3 days)  Illeus - low grade coffee ground emesis KUB confirmed ileus - suspect pt has suffered mild mallory-weiss tear due to vomiting in setting of coagulopathy, vs/ chemo associated mucositis - keep npo - NG tube for decompression has produced 5+L of bilious fluid - get him up to chair - cont NG - f/u KUB in AM - cont to stimulate bowels from below    Coagulopathy 2nd to chronic coumadin for A fib INR was markedly elevated at 6.74 in setting of coffee ground emesis - was given Vitamin K 10mg  + 4 U FFP for reversal - resume anticoag w/ lovenox today, and follow CBC as well as NG output   Pustular folliculitis Cont abx coverage - rash has essentially resolved  Venous stasis dermatitis  Cellulitis of leg, left resolved w/ abx tx  Pancytopenia 2nd to chemotherapy for CLL neupogen x 1 on 7/21 - WBC slowly improving   Chronic Afib cont IV Cardizem while npo - rate reasonably controlled at this time   Hyperglycemia Likely stress response - A1c 5.5   Acute on chronic renal failure baseline cr 2.5 - crt is currently better than his baseline  Hyperkalemia resolved  Acute toxic metabolic encephalopathy  2nd to sepsis - now resolved - follow clinically - folate is normal - B12 low at 276 so replacement has been ordered    Hx of HTN BP well controlled at this time/borderline hypotensive - follow   Access CVL will need to be removed in next 24hrs, w/ consideration being given to placing PICC for long term abx tx  Code Status: FULL Family Communication: spoke w/ daughter and wife at bedside Disposition Plan: SDU  Consultants: PCCM >> [facility] ID Gen Surgery   Procedures: Rt IJ CVL 7/21 >>   Antibiotics: Cefazolin 7/22 >> Primaxin 7/21 >> 7/22 Acyclovir 7/21 >> 7/22 Micafungin 7/21 >> 7/22  Vancomycin 7/21 >> 7/24  DVT prophylaxis: SCDs >>  lovenox  HPI/Subjective: Feels much better w/ NG in place.  No nausea.  Is alert and interactive.  No chest pain or sob.  Objective: Blood pressure 98/69, pulse 109, temperature 97.5 F (36.4 C), temperature source Oral, resp. rate 20, height 5' 8.5" (1.74 m), weight 151 kg (332 lb 14.3 oz), SpO2 99.00%.  Intake/Output Summary (Last 24 hours) at 08/07/12 1506 Last data filed at 08/07/12 1216  Gross per 24 hour  Intake   2350 ml  Output   6550 ml  Net  -4200 ml     Exam: General: No acute respiratory distress at rest in bed  Lungs: Clear to auscultation bilaterally without wheezes or crackles - BS are distant Cardiovascular: tachycardic - no appreciable gallup or M but HS are quite distant Abdomen: morbidly obese, nontender, protuberant, soft, bowel sounds high pitched, no rebound, no ascites, no appreciable mass Extremities: change of chronic venous stasis B - 1+ edema B LE - charcot joints B ankles  Data Reviewed: Basic Metabolic Panel:  Recent Labs Lab 08/01/12 0430 08/02/12 0500 08/03/12 0500 08/05/12 0936 08/06/12 0558  NA 135 136 135 140 141  K 4.7 4.5 4.3 4.2 4.1  CL 101 104 103 109 110  CO2 22 22 23 20 23   GLUCOSE 112* 126* 130* 118* 116*  BUN 65* 65* 62* 64* 59*  CREATININE 2.82* 2.27* 1.93* 1.78* 1.72*  CALCIUM 8.1* 8.3* 8.2* 8.1* 8.3*  MG  --   --   --  2.0  --   PHOS  --   --   --  3.0  --    Liver Function Tests:  Recent Labs Lab 08/01/12 0430 08/03/12 0500 08/05/12 0936  AST 11 7 10   ALT 10 <5 <5  ALKPHOS 36* 35* 40  BILITOT 1.2 0.5 1.1  PROT 5.2* 4.8* 4.6*  ALBUMIN 2.3* 2.0* 1.9*   CBC:  Recent Labs Lab 07/31/12 2000  08/04/12 0500 08/05/12 0936 08/05/12 1635 08/06/12 0558 08/07/12 0500  WBC 0.4*  < > 0.5* 0.4* 0.5* 0.6* 0.8*  NEUTROABS 0.4*  --   --   --   --   --   --   HGB 6.4*  < > 7.1* 6.6* 7.5* 7.7* 7.5*  HCT 19.4*  < > 21.8* 20.8* 22.5* 23.3* 23.3*  MCV 93.7  < > 94.0 93.3 92.2 92.8 94.0  PLT 67*  < > 65* 65* 66* 74* 85*  < > = values in this interval not displayed. CBG:  Recent Labs Lab 08/05/12 1131 08/05/12 1704 08/05/12 2120 08/06/12 2123 08/07/12 0805  GLUCAP 114* 104* 107* 116* 124*    Recent Results (from the past 240 hour(s))  CULTURE, BLOOD (ROUTINE X 2)     Status: None   Collection Time    07/31/12 12:23 PM      Result Value Range Status   Specimen Description BLOOD LEFT FOREARM   Final   Special Requests     Final   Value: BOTTLES DRAWN AEROBIC AND ANAEROBIC  AEB=12CC ANA=8CC   Culture  Setup Time 08/02/2012 00:26   Final   Culture     Final   Value: STAPHYLOCOCCUS AUREUS     Note: RIFAMPIN AND GENTAMICIN SHOULD NOT BE USED AS SINGLE DRUGS FOR TREATMENT OF STAPH INFECTIONS.     Note: Gram Stain Report Called to,Read Back By and Verified With:  MASON J AT 1115 ON 08/01/2012 BY Luetta Nutting M Performed at [facility]   Report Status 08/03/2012 FINAL   Final   Organism ID, Bacteria STAPHYLOCOCCUS AUREUS   Final  CULTURE, BLOOD (ROUTINE X 2)     Status: None   Collection Time    07/31/12 12:27 PM      Result Value Range Status   Specimen Description BLOOD PORTA CATH DRAWN BY RN LT   Final   Special Requests BOTTLES DRAWN AEROBIC AND ANAEROBIC [facility]   Final   Culture  Setup Time 08/02/2012 00:30   Final   Culture     Final   Value: STAPHYLOCOCCUS AUREUS     Note: SUSCEPTIBILITIES PERFORMED ON PREVIOUS CULTURE WITHIN THE LAST 5 DAYS.     Note: Gram Stain Report Called to,Read Back By and Verified With: MASON J AT 1115 ON 08/01/2012 BY Ginette Pitman Performed at [facility]   Report Status 08/03/2012 FINAL   Final  URINE CULTURE     Status: None   Collection Time    07/31/12 12:55 PM      Result Value Range Status   Specimen Description URINE, CLEAN CATCH   Final   Special Requests NONE   Final   Culture  Setup Time 08/01/2012 03:22   Final   Colony Count NO GROWTH   Final   Culture NO GROWTH   Final   Report Status 08/02/2012 FINAL   Final  MRSA PCR SCREENING     Status: None   Collection Time    07/31/12  6:02 PM      Result Value Range Status   MRSA by PCR NEGATIVE  NEGATIVE Final   Comment:            The GeneXpert MRSA Assay (FDA     approved for NASAL specimens     only), is one component of a     comprehensive MRSA colonization     surveillance program. It is not     intended to diagnose MRSA     infection nor to guide or     monitor treatment for     MRSA infections.  CULTURE, BLOOD (ROUTINE X 2)     Status: None    Collection Time    08/02/12  5:25 PM      Result Value Range Status   Specimen Description BLOOD RIGHT ARM   Final   Special Requests BOTTLES DRAWN AEROBIC AND ANAEROBIC 10CC   Final   Culture  Setup Time 08/02/2012 22:29   Final   Culture     Final   Value:        BLOOD CULTURE RECEIVED NO GROWTH TO DATE CULTURE WILL BE HELD FOR 5 DAYS BEFORE ISSUING A FINAL NEGATIVE REPORT   Report Status PENDING   Incomplete  CULTURE, BLOOD (ROUTINE X 2)     Status: None   Collection Time    08/02/12  5:35 PM      Result Value Range Status   Specimen Description BLOOD RIGHT ARM   Final   Special Requests BOTTLES DRAWN AEROBIC AND ANAEROBIC 10CC   Final   Culture  Setup Time 08/02/2012 22:30   Final   Culture     Final   Value:        BLOOD CULTURE RECEIVED NO GROWTH TO DATE CULTURE WILL BE HELD FOR 5 DAYS BEFORE ISSUING A FINAL NEGATIVE REPORT   Report Status PENDING   Incomplete     Studies:  Recent x-ray studies have been reviewed in detail  by the Attending Physician  Scheduled Meds:  Scheduled Meds: . antiseptic oral rinse  15 mL Mouth Rinse q12n4p  .  ceFAZolin (ANCEF) IV  2 g Intravenous Q8H  . chlorhexidine  15 mL Mouth Rinse BID  . pantoprazole (PROTONIX) IV  40 mg Intravenous Q24H    Time spent on care of this patient: 35 mins   [facility] T  [facility] Office  [phone] Pager - Text Page per Loretha Stapler as per below:  On-Call/Text Page:      [email or web address]      password [email or web address]  If 7PM-7AM, please contact night-coverage [email or web address] Password [email or web address] 08/07/2012, 3:06 PM   LOS: 7 days

## 2012-08-07 NOTE — Progress Notes (Signed)
Physical Therapy Treatment Patient Details Name: Alex Petty MRN: [id number] DOB: April 25, 1945 Today's Date: 08/07/2012 Time: 4098-1191 PT Time Calculation (min): 24 min  PT Assessment / Plan / Recommendation  History of Present Illness 67 yo morbidly obese male admitted 7/21 for septic shock with neutropenia. Patient started on vanc/primaxin/acyclovir/micafungin, but narrowed to current vancomycin and cefazolin for cultures revealing staph aureus. Source likely R thigh vesicular lesions/R foot   Clinical Impression Pt tolerated sitting x 30 seconds this date however remains limited by onset of back spasms. Pt able to assist more with rolling and pulling self up to [facility]. OOB mobility remains limited by onset of back pain. Pt unable to tolerate HOB > 15 deg due to onset of pain.  Pt dependent for hygiene s/p BM. PT to con't to work with PT to progress mobility as able.   PT Comments     Follow Up Recommendations  Home health PT;Supervision/Assistance - 24 hour     Does the patient have the potential to tolerate intense rehabilitation     Barriers to Discharge        Equipment Recommendations       Recommendations for Other Services    Frequency Min 3X/week   Progress towards PT Goals Progress towards PT goals: Progressing toward goals  Plan Current plan remains appropriate    Precautions / Restrictions Precautions Precautions: Fall Restrictions Weight Bearing Restrictions: No   Pertinent Vitals/Pain 10/10 back pain with mobility    Mobility  Bed Mobility Bed Mobility: Rolling Right;Right Sidelying to Sit;Sit to Supine Rolling Right: 1: +2 Total assist;With rail Rolling Right: Patient Percentage: 50% Right Sidelying to Sit: 1: +2 Total assist;With rails;HOB elevated Right Sidelying to Sit: Patient Percentage: 50% Sit to Supine: 1: +2 Total assist Sit to Supine: Patient Percentage: 50% Details for Bed Mobility Assistance: assit for trunk elevation and LE  elevation Transfers Transfers: Not assessed Ambulation/Gait Ambulation/Gait Assistance: Not tested (comment)    Exercises     PT Diagnosis:    PT Problem List:   PT Treatment Interventions:     PT Goals (current goals can now be found in the care plan section) Acute Rehab PT Goals Patient Stated Goal: To decrease pain  Visit Information  Last PT Received On: 08/07/12 Assistance Needed: +2 History of Present Illness: 67 yo morbidly obese male admitted 7/21 for septic shock with neutropenia. Patient started on vanc/primaxin/acyclovir/micafungin, but narrowed to current vancomycin and cefazolin for cultures revealing staph aureus. Source likely R thigh vesicular lesions/R foot    Subjective Data  Subjective: pt reports "the intense pain when i'm laying here is better." Patient Stated Goal: To decrease pain   Cognition  Cognition Arousal/Alertness: Awake/alert Behavior During Therapy: WFL for tasks assessed/performed Overall Cognitive Status: Within Functional Limits for tasks assessed    Balance  Balance Balance Assessed: Yes Static Sitting Balance Static Sitting - Balance Support: Bilateral upper extremity supported;Feet supported Static Sitting - Level of Assistance: 4: Min assist Static Sitting - Comment/# of Minutes: max encourgement to stay sitting up however onset of back spasm  End of Session PT - End of Session Activity Tolerance: Patient limited by pain;Other (comment) Patient left: in bed;with call bell/phone within reach Nurse Communication: Mobility status   GP     Marcene Brawn 08/07/2012, 4:35 PM  Lewis Shock, PT, DPT Pager #: [phone] Office #: [phone]

## 2012-08-07 NOTE — Progress Notes (Signed)
Patient ID: Alex Petty, male   DOB: 13-Apr-1945, 67 y.o.   MRN: [id number]         [facility]    Date of Admission:  07/31/2012   Total days of antibiotics 8         Principal Problem:   Bacteremia Active Problems:   Cellulitis of leg, left   Pustular folliculitis   Acute on chronic kidney disease, stage 3   Venous stasis dermatitis   A-fib   CLL (chronic lymphocytic leukemia)   Septic shock   Neutropenic fever   Antineoplastic chemotherapy induced pancytopenia   Acute back pain   . antiseptic oral rinse  15 mL Mouth Rinse q12n4p  .  ceFAZolin (ANCEF) IV  2 g Intravenous Q8H  . chlorhexidine  15 mL Mouth Rinse BID  . pantoprazole (PROTONIX) IV  40 mg Intravenous Q24H    Subjective: He feels better after EMG tube placement yesterday. He states that it gave him immediate relief from his abdominal pain, bloating nausea. He has not been able to tolerate sitting up because of his acute, severe back pain.  Objective: Temp:  [97.3 F (36.3 C)-98.8 F (37.1 C)] 97.6 F (36.4 C) (07/28 0835) Pulse Rate:  [96-121] 101 (07/28 0835) Resp:  [13-22] 18 (07/28 0835) BP: (96-121)/(62-101) 108/63 mmHg (07/28 0835) SpO2:  [92 %-100 %] 96 % (07/28 0835) Weight:  [151 kg (332 lb 14.3 oz)] 151 kg (332 lb 14.3 oz) (07/28 0402)  General: Alert and more comfortable Skin: Generalized edema Lungs: Clear  Cor: Regular S1 and S2 with no murmurs. Previous Port-A-Cath site is bandaged with a clean dry dressing Abdomen: Soft and nontender Joints and extremities: Pitting edema in lower extremities Neuro: Generalized weakness but he moves all extremities and does not appear to have any focal deficits  Lab Results Lab Results  Component Value Date   WBC 0.8* 08/07/2012   HGB 7.5* 08/07/2012   HCT 23.3* 08/07/2012   MCV 94.0 08/07/2012   PLT 85* 08/07/2012    Lab Results  Component Value Date   CREATININE 1.72* 08/06/2012   BUN 59* 08/06/2012   NA 141 08/06/2012    K 4.1 08/06/2012   CL 110 08/06/2012   CO2 23 08/06/2012    Assessment: He is cleared his MSSA bacteremia and his Port-A-Cath has been removed. I am worried that he has lumbar infection given his acute, severe back pain. We'll see if we can get a lumbar MRI this week.  Plan: 1. Continue cefazolin 2. Lumbar MRI without contrast 3. I will followup on Wednesday, July 20  Cliffton Asters, MD [facility] [phone] pager   [phone] cell 08/07/2012, 11:41 AM

## 2012-08-07 NOTE — Progress Notes (Addendum)
Spoke to [place] with MRI. MRI to be rescheduled in the AM. Paged K.[facility] midlevel. K.Kirby midlevel aware of change  -0310, pt had a 6 beat run of Vtach. Notified K.Kir[facility] midlevel. No new orders received.

## 2012-08-08 ENCOUNTER — Encounter (HOSPITAL_COMMUNITY): Payer: Self-pay | Admitting: Surgery

## 2012-08-08 DIAGNOSIS — M549 Dorsalgia, unspecified: Secondary | ICD-10-CM

## 2012-08-08 LAB — COMPREHENSIVE METABOLIC PANEL
ALT: <5 U/L (ref 0–53)
Albumin: 1.7 g/dL — ABNORMAL LOW (ref 3.5–5.2)
Alkaline Phosphatase: 49 U/L (ref 39–117)
Calcium: 8.1 mg/dL — ABNORMAL LOW (ref 8.4–10.5)
Potassium: 3.6 mEq/L (ref 3.5–5.1)
Sodium: 151 mEq/L — ABNORMAL HIGH (ref 135–145)
Total Protein: 4.2 g/dL — ABNORMAL LOW (ref 6.0–8.3)

## 2012-08-08 LAB — CBC
HCT: 22.7 % — ABNORMAL LOW (ref 39.0–52.0)
Hemoglobin: 7.2 g/dL — ABNORMAL LOW (ref 13.0–17.0)
MCHC: 31.7 g/dL (ref 30.0–36.0)
MCV: 95 fL (ref 78.0–100.0)
RDW: 18.6 % — ABNORMAL HIGH (ref 11.5–15.5)
WBC: 0.7 10*3/uL — CL (ref 4.0–10.5)

## 2012-08-08 LAB — CULTURE, BLOOD (ROUTINE X 2)
Culture: NO GROWTH
Culture: NO GROWTH

## 2012-08-08 MED ORDER — SODIUM CHLORIDE 0.45 % IV SOLN
INTRAVENOUS | Status: DC
  Administered 2012-08-08: 20:00:00 via INTRAVENOUS
  Administered 2012-08-08: 1000 mL via INTRAVENOUS
  Administered 2012-08-09: 06:00:00 via INTRAVENOUS
  Administered 2012-08-09: 1000 mL via INTRAVENOUS
  Administered 2012-08-10: 01:00:00 via INTRAVENOUS

## 2012-08-08 NOTE — Progress Notes (Signed)
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 He developed rash on right thigh originally thought to be zoster. He was started on zovirax as outpt. He developed progressive weakness to the point he had trouble walking. He was becoming more confused. He had fever to 103F. Family took him to [facility] ED. He was found to be hypotensive, febrile, anemic, and neutropenic. He had CVL placed, was given IV fluid, transfused 1 unit PRBC, and started on ABx. He was transfered to [facility] for further assessment.   While at [facility], the pt has been diagnosed w/ Staph bacteremia.  As a result, his Port-A-Cath has been removed.  He has also required tx for an ileus.    SIGNIFICANT EVENTS:  7/21 - Transfer to [facility] on PCCM service, PRBC transfusion  7/23 - [facility] assumed care of pt  7/25 - pt developed hematemesis - INR found to be >6.0 7/25-7/26 - transfused FFP, PRBC, and Vitamin K 10mg  7/26 - to OR for Port-A-Cath removal 7/27 - NG placed  Assessment/Plan:  Neutropenic fever with Septic shock - Staph aureus bacteremia (2/2 cultures +) did not require pressors - lactate cleared - possible sources are Rt thigh lesions and right foot - confirmed to be staph on 7/23 - Port-A-Cath removed 7/26 - ID directing abx and extent of further w/u required - TTE without acute findings or suggestion of valvular involvement - lumbar MRI ordered due to lumbar pain which apparent has been present since admission  Back pain - pt unable to sit up in bed for more than 2 min  Illeus - low grade coffee ground emesis KUB confirmed ileus - suspect pt has suffered mild mallory-weiss tear due to vomiting in setting of coagulopathy, vs/ chemo associated mucositis - keep npo - NG tube for decompression has produced 5+L of bilious fluid - get him up to chair -  cont NG - still quite a bit of drainage-  f/u KUB in AM - cont to stimulate bowels from below - Dulcolax 10 mg PR daily and Protonix 40 mg IV Q12  Coagulopathy 2nd to chronic coumadin for A fib INR was markedly elevated at 6.74 in setting of coffee ground emesis - was given Vitamin K 10mg  + 4 U FFP for reversal - resumed anticoag w/ lovenox on 7/28, - follow CBC -  NG output non-bloody  Pustular folliculitis Cont abx coverage - rash has essentially resolved  Venous stasis dermatitis  Cellulitis of leg, left resolved w/ abx tx  Pancytopenia 2nd to chemotherapy for CLL neupogen x 1 on 7/21 - WBC still about the same  Chronic Afib cont IV Cardizem while npo - rate reasonably controlled at this time   Hyperglycemia Likely stress response - A1c 5.5   Acute on chronic renal failure baseline cr 2.5 - crt is currently better than his baseline  Hyperkalemia resolved  Acute toxic metabolic encephalopathy  2nd to sepsis - now resolved - follow clinically - folate is normal - B12 low at 276 so replacement has been ordered    Hx of HTN BP well controlled at this time/borderline hypotensive - follow   Access CVL will need to be removed in next 24hrs, w/ consideration being given to placing PICC for long term abx tx  Code Status: FULL Family Communication: spoke w/ wife at bedside Disposition Plan: SDU  Consultants: PCCM >> [facility] ID Gen Surgery   Procedures: Rt IJ CVL 7/21 >>   Antibiotics: Cefazolin 7/22 >> Primaxin 7/21 >> 7/22 Acyclovir 7/21 >> 7/22 Micafungin 7/21 >> 7/22 Vancomycin 7/21 >> 7/24  DVT prophylaxis: SCDs >>  lovenox  HPI/Subjective: Feels much better w/ NG in place.  No nausea.  Is alert and interactive.  No chest pain or sob.  Objective: Blood pressure 120/65, pulse 89, temperature 98.7 F (37.1 C), temperature source Oral, resp. rate 16, height 5' 8.5" (1.74 m), weight 151 kg (332 lb 14.3 oz), SpO2 99.00%.  Intake/Output Summary (Last 24 hours) at  08/08/12 1807 Last data filed at 08/08/12 1700  Gross per 24 hour  Intake   1625 ml  Output   3130 ml  Net  -1505 ml    Exam: General: No acute respiratory distress at rest in bed  Lungs: Clear to auscultation bilaterally without wheezes or crackles - BS are distant Cardiovascular: tachycardic - no appreciable gallup or M but HS are quite distant Abdomen: morbidly obese, nontender, protuberant, soft, bowel sounds high pitched, no rebound, no ascites, no appreciable mass Extremities: change of chronic venous stasis B - 1+ edema B LE - charcot joints B ankles  Data Reviewed: Basic Metabolic Panel:  Recent Labs Lab 08/02/12 0500 08/03/12 0500 08/05/12 0936 08/06/12 0558 08/08/12 0411  NA 136 135 140 141 151*  K 4.5 4.3 4.2 4.1 3.6  CL 104 103 109 110 118*  CO2 22 23 20 23 24   GLUCOSE 126* 130* 118* 116* 115*  BUN 65* 62* 64* 59* 57*  CREATININE 2.27* 1.93* 1.78* 1.72* 2.04*  CALCIUM 8.3* 8.2* 8.1* 8.3* 8.1*  MG  --   --  2.0  --   --   PHOS  --   --  3.0  --   --    Liver Function Tests:  Recent Labs Lab 08/03/12 0500 08/05/12 0936 08/08/12 0411  AST 7 10 6   ALT <5 <5 <5  ALKPHOS 35* 40 49  BILITOT 0.5 1.1 1.9*  PROT 4.8* 4.6* 4.2*  ALBUMIN 2.0* 1.9* 1.7*   CBC:  Recent Labs Lab 08/05/12 0936 08/05/12 1635 08/06/12 0558 08/07/12 0500 08/08/12 0411  WBC 0.4* 0.5* 0.6* 0.8* 0.7*  HGB 6.6* 7.5* 7.7* 7.5* 7.2*  HCT 20.8* 22.5* 23.3* 23.3* 22.7*  MCV 93.3 92.2 92.8 94.0 95.0  PLT 65* 66* 74* 85* 81*   CBG:  Recent Labs Lab 08/06/12 2123 08/07/12 0805 08/07/12 1144 08/07/12 1540 08/07/12 2109  GLUCAP 116* 124* 117* 116* 103*    Recent Results (from the past 240 hour(s))  CULTURE, BLOOD (ROUTINE X 2)     Status: None   Collection Time    07/31/12 12:23 PM      Result Value Range Status   Specimen Description BLOOD LEFT FOREARM   Final   Special Requests     Final   Value: BOTTLES DRAWN AEROBIC AND ANAEROBIC AEB=12CC ANA=8CC   Culture  Setup  Time 08/02/2012 00:26   Final   Culture     Final   Value: STAPHYLOCOCCUS AUREUS     Note: RIFAMPIN AND GENTAMICIN SHOULD NOT BE USED AS SINGLE DRUGS FOR TREATMENT OF STAPH INFECTIONS.     Note: Gram Stain Report Called to,Read Back By and Verified With:  MASON J AT 1115 ON 08/01/2012 BY Ginette Pitman Performed at [facility]   Report Status 08/03/2012 FINAL   Final   Organism ID, Bacteria STAPHYLOCOCCUS AUREUS  Final  CULTURE, BLOOD (ROUTINE X 2)     Status: None   Collection Time    07/31/12 12:27 PM      Result Value Range Status   Specimen Description BLOOD PORTA CATH DRAWN BY RN LT   Final   Special Requests BOTTLES DRAWN AEROBIC AND ANAEROBIC [facility]   Final   Culture  Setup Time 08/02/2012 00:30   Final   Culture     Final   Value: STAPHYLOCOCCUS AUREUS     Note: SUSCEPTIBILITIES PERFORMED ON PREVIOUS CULTURE WITHIN THE LAST 5 DAYS.     Note: Gram Stain Report Called to,Read Back By and Verified With: MASON J AT 1115 ON 08/01/2012 BY Ginette Pitman Performed at [facility]   Report Status 08/03/2012 FINAL   Final  URINE CULTURE     Status: None   Collection Time    07/31/12 12:55 PM      Result Value Range Status   Specimen Description URINE, CLEAN CATCH   Final   Special Requests NONE   Final   Culture  Setup Time 08/01/2012 03:22   Final   Colony Count NO GROWTH   Final   Culture NO GROWTH   Final   Report Status 08/02/2012 FINAL   Final  MRSA PCR SCREENING     Status: None   Collection Time    07/31/12  6:02 PM      Result Value Range Status   MRSA by PCR NEGATIVE  NEGATIVE Final   Comment:            The GeneXpert MRSA Assay (FDA     approved for NASAL specimens     only), is one component of a     comprehensive MRSA colonization     surveillance program. It is not     intended to diagnose MRSA     infection nor to guide or     monitor treatment for     MRSA infections.  CULTURE, BLOOD (ROUTINE X 2)     Status: None   Collection Time    08/02/12  5:25 PM       Result Value Range Status   Specimen Description BLOOD RIGHT ARM   Final   Special Requests BOTTLES DRAWN AEROBIC AND ANAEROBIC 10CC   Final   Culture  Setup Time 08/02/2012 22:29   Final   Culture NO GROWTH 5 DAYS   Final   Report Status 08/08/2012 FINAL   Final  CULTURE, BLOOD (ROUTINE X 2)     Status: None   Collection Time    08/02/12  5:35 PM      Result Value Range Status   Specimen Description BLOOD RIGHT ARM   Final   Special Requests BOTTLES DRAWN AEROBIC AND ANAEROBIC 10CC   Final   Culture  Setup Time 08/02/2012 22:30   Final   Culture NO GROWTH 5 DAYS   Final   Report Status 08/08/2012 FINAL   Final     Studies:  Recent x-ray studies have been reviewed in detail by the Attending Physician  Scheduled Meds:  Scheduled Meds: . antiseptic oral rinse  15 mL Mouth Rinse q12n4p  . bisacodyl  10 mg Rectal Q1500  .  ceFAZolin (ANCEF) IV  2 g Intravenous Q8H  . chlorhexidine  15 mL Mouth Rinse BID  . enoxaparin (LOVENOX) injection  150 mg Subcutaneous Q12H  . pantoprazole (PROTONIX) IV  40 mg Intravenous Q24H  . sodium chloride  10-40  mL Intracatheter Q12H    Time spent on care of this patient: 35 mins   Calvert Cantor, MD  [facility] Office  [phone] Pager - Text Page per Loretha Stapler as per below:  On-Call/Text Page:      [email or web address]      password [email or web address]  If 7PM-7AM, please contact night-coverage [email or web address] Password [email or web address] 08/08/2012, 6:07 PM   LOS: 8 days

## 2012-08-09 ENCOUNTER — Inpatient Hospital Stay (HOSPITAL_COMMUNITY): Payer: Medicare Other

## 2012-08-09 ENCOUNTER — Other Ambulatory Visit (HOSPITAL_COMMUNITY): Payer: Medicare Other

## 2012-08-09 DIAGNOSIS — D61818 Other pancytopenia: Secondary | ICD-10-CM

## 2012-08-09 DIAGNOSIS — T451X5A Adverse effect of antineoplastic and immunosuppressive drugs, initial encounter: Secondary | ICD-10-CM

## 2012-08-09 DIAGNOSIS — D61810 Antineoplastic chemotherapy induced pancytopenia: Secondary | ICD-10-CM

## 2012-08-09 LAB — CBC
HCT: 23.4 % — ABNORMAL LOW (ref 39.0–52.0)
Hemoglobin: 7.3 g/dL — ABNORMAL LOW (ref 13.0–17.0)
MCV: 95.9 fL (ref 78.0–100.0)
RBC: 2.44 MIL/uL — ABNORMAL LOW (ref 4.22–5.81)
RDW: 18.5 % — ABNORMAL HIGH (ref 11.5–15.5)
WBC: 0.9 10*3/uL — CL (ref 4.0–10.5)

## 2012-08-09 LAB — BASIC METABOLIC PANEL
BUN: 53 mg/dL — ABNORMAL HIGH (ref 6–23)
CO2: 24 mEq/L (ref 19–32)
Chloride: 116 mEq/L — ABNORMAL HIGH (ref 96–112)
GFR calc Af Amer: 35 mL/min — ABNORMAL LOW (ref >90)
Glucose, Bld: 111 mg/dL — ABNORMAL HIGH (ref 70–99)
Potassium: 3.6 mEq/L (ref 3.5–5.1)

## 2012-08-09 NOTE — Progress Notes (Signed)
Patient ID: Alex Petty, male   DOB: Jun 07, 1945, 67 y.o.   MRN: [id number]         [facility]    Date of Admission:  07/31/2012           Day 10 cefazolin  Principal Problem:   Bacteremia Active Problems:   Cellulitis of leg, left   Pustular folliculitis   Acute on chronic kidney disease, stage 3   Venous stasis dermatitis   A-fib   CLL (chronic lymphocytic leukemia)   Septic shock   Neutropenic fever   Antineoplastic chemotherapy induced pancytopenia   Acute back pain   . antiseptic oral rinse  15 mL Mouth Rinse q12n4p  . bisacodyl  10 mg Rectal Q1500  .  ceFAZolin (ANCEF) IV  2 g Intravenous Q8H  . chlorhexidine  15 mL Mouth Rinse BID  . enoxaparin (LOVENOX) injection  150 mg Subcutaneous Q12H  . pantoprazole (PROTONIX) IV  40 mg Intravenous Q24H  . sodium chloride  10-40 mL Intracatheter Q12H    Subjective: He is feeling a little bit better but continues to have intermittent spasms in his lower back.  Objective: Temp:  [97.5 F (36.4 C)-98.9 F (37.2 C)] 97.6 F (36.4 C) (07/30 0826) Pulse Rate:  [89-119] 119 (07/30 0826) Resp:  [9-22] 14 (07/30 0826) BP: (100-140)/(55-76) 140/69 mmHg (07/30 0826) SpO2:  [96 %-100 %] 96 % (07/30 0826)  General: Alert and fairly comfortable laying flat in bed Skin: New left arm PICC Lungs: Clear Cor: Regular S1-S2 no murmur Abdomen: Distended and quiet. NG tube remains in but has been clamped.   Lab Results Lab Results  Component Value Date   WBC 0.9* 08/09/2012   HGB 7.3* 08/09/2012   HCT 23.4* 08/09/2012   MCV 95.9 08/09/2012   PLT 86* 08/09/2012    Lab Results  Component Value Date   CREATININE 2.14* 08/09/2012   BUN 53* 08/09/2012   NA 149* 08/09/2012   K 3.6 08/09/2012   CL 116* 08/09/2012   CO2 24 08/09/2012    Lab Results  Component Value Date   ALT <5 08/08/2012   AST 6 08/08/2012   ALKPHOS 49 08/08/2012   BILITOT 1.9* 08/08/2012      Microbiology: Recent Results (from the  past 240 hour(s))  CULTURE, BLOOD (ROUTINE X 2)     Status: None   Collection Time    07/31/12 12:23 PM      Result Value Range Status   Specimen Description BLOOD LEFT FOREARM   Final   Special Requests     Final   Value: BOTTLES DRAWN AEROBIC AND ANAEROBIC AEB=12CC ANA=8CC   Culture  Setup Time 08/02/2012 00:26   Final   Culture     Final   Value: STAPHYLOCOCCUS AUREUS     Note: RIFAMPIN AND GENTAMICIN SHOULD NOT BE USED AS SINGLE DRUGS FOR TREATMENT OF STAPH INFECTIONS.     Note: Gram Stain Report Called to,Read Back By and Verified With:  MASON J AT 1115 ON 08/01/2012 BY Ginette Pitman Performed at [facility]   Report Status 08/03/2012 FINAL   Final   Organism ID, Bacteria STAPHYLOCOCCUS AUREUS   Final  CULTURE, BLOOD (ROUTINE X 2)     Status: None   Collection Time    07/31/12 12:27 PM      Result Value Range Status   Specimen Description BLOOD PORTA CATH DRAWN BY RN LT   Final   Special Requests BOTTLES DRAWN  AEROBIC AND ANAEROBIC [facility]   Final   Culture  Setup Time 08/02/2012 00:30   Final   Culture     Final   Value: STAPHYLOCOCCUS AUREUS     Note: SUSCEPTIBILITIES PERFORMED ON PREVIOUS CULTURE WITHIN THE LAST 5 DAYS.     Note: Gram Stain Report Called to,Read Back By and Verified With: MASON J AT 1115 ON 08/01/2012 BY Ginette Pitman Performed at [facility]   Report Status 08/03/2012 FINAL   Final  URINE CULTURE     Status: None   Collection Time    07/31/12 12:55 PM      Result Value Range Status   Specimen Description URINE, CLEAN CATCH   Final   Special Requests NONE   Final   Culture  Setup Time 08/01/2012 03:22   Final   Colony Count NO GROWTH   Final   Culture NO GROWTH   Final   Report Status 08/02/2012 FINAL   Final  MRSA PCR SCREENING     Status: None   Collection Time    07/31/12  6:02 PM      Result Value Range Status   MRSA by PCR NEGATIVE  NEGATIVE Final   Comment:            The GeneXpert MRSA Assay (FDA     approved for NASAL specimens      only), is one component of a     comprehensive MRSA colonization     surveillance program. It is not     intended to diagnose MRSA     infection nor to guide or     monitor treatment for     MRSA infections.  CULTURE, BLOOD (ROUTINE X 2)     Status: None   Collection Time    08/02/12  5:25 PM      Result Value Range Status   Specimen Description BLOOD RIGHT ARM   Final   Special Requests BOTTLES DRAWN AEROBIC AND ANAEROBIC 10CC   Final   Culture  Setup Time 08/02/2012 22:29   Final   Culture NO GROWTH 5 DAYS   Final   Report Status 08/08/2012 FINAL   Final  CULTURE, BLOOD (ROUTINE X 2)     Status: None   Collection Time    08/02/12  5:35 PM      Result Value Range Status   Specimen Description BLOOD RIGHT ARM   Final   Special Requests BOTTLES DRAWN AEROBIC AND ANAEROBIC 10CC   Final   Culture  Setup Time 08/02/2012 22:30   Final   Culture NO GROWTH 5 DAYS   Final   Report Status 08/08/2012 FINAL   Final    Studies/Results: Dg Chest Port 1 View  08/07/2012   *RADIOLOGY REPORT*  Clinical Data: None.  Left upper extremity PICC.  PORTABLE CHEST - 1 VIEW  Comparison: 07/31/2012.  Findings: Cardiopericardial silhouette is mildly enlarged. Airspace disease and atelectasis at the right lung base.  Right IJ central line is unchanged.  Enteric tube is present.  Left upper extremity PICC is new and the tip is in the mid SVC, just inferior to the level of the carina. Sclerosis of the left humerus is present and there are severe degenerative changes of left and right glenohumeral joints.  IMPRESSION: New left upper extremity PICC with the tip in the mid superior vena cava just inferior to the level of the carina.  Interval placement of enteric tube with the tip not visualized.  Original Report Authenticated By: Andreas Newport, M.D.    Assessment: He is showing some improvement on therapy for MSSA bacteremia. He has persistent pancytopenia after his first round of chemotherapy for CLL. I would  consider touching base with oncology to see if they recommend any changes. He is scheduled for lumbar MRI today he does see if he has lumbar infection. If there is evidence of lumbar infection he only a minimum of 6 weeks of IV antibiotic therapy so I have not pursued TEE since it may not alter his management.  Plan: 1. Continue cefazolin 2. Await results of lumbar MRI 3. Recommend discussing situation with persistent pancytopenia with oncology  Cliffton Asters, MD [facility] [phone] pager   [phone] cell 08/09/2012, 11:24 AM

## 2012-08-09 NOTE — Consult Note (Signed)
Reason for Referral: CLL and neutropenia.  HPI: Alex Petty is a very nice man with the diagnosis of CLL who is currently undergoing therapy at [facility]. He is a gentleman with past medical history significant for chronic insufficiency hypertension a presented with lymphadenopathy in June of 2014 and underwent a lymph node biopsy on 07/11/2012. That confirmed the presence of small lymphocytic lymphoma/CLL. Patient treated with combination of bendamustine and rituximab on 07/27/2012. He presented to [facility] ED on  7/20 due to weakness and lethargy for 1 week. Associated with vomiting, confusion. Prior to onset of symptoms, he began chemotherapy and also received a blood transfusion. He was to have hypotension, fevers, neutropenia, anemia and tachycardia. A central line was placed, he was given IVF and transferred to [facility] ICU under Critical Care. He was started on antibiotics infectious disease were consulted. His blood cultures showed MSSA bacteremia. His port was removed on 07/31/2012. He is continued to be pancytopenic and we were asked to comment on his pancytopenia as well as his overall lymphoma treatment.  Clinically, Alex Petty still in step down unit with an NG tube in place. Is not reporting any specific symptoms at this time. He is not reporting any chest pain or difficulty breathing. Reporting any abdominal distention or any bleeding.    Past Medical History  Diagnosis Date  . Arthritis   . Hypertension   . DJD (degenerative joint disease)   . DJD (degenerative joint disease), ankle and foot   . Arthritis   . Cellulitis   . Arthritis   . Chronic back pain   . Gout   . Dysrhythmia     afibrillation  . Atrial fibrillation   . Chronic kidney disease   . Colon cancer     2005  . 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 Charcot ankle     dr. Onalee Hua tucker  :  Past Surgical History  Procedure Laterality Date  . Foot surgery      right foot-  . Colon surgery       forcolon cancer, 2005, Dr. Marcell Anger in [place]  . Tonsillectomy    . Cystoscopy with urethral dilatation    . Back surgery      x2  . Cataract extraction w/phaco  01/14/2011    Procedure: CATARACT EXTRACTION PHACO AND INTRAOCULAR LENS PLACEMENT (IOC);  Surgeon: Gemma Payor;  Location: [facility] ORS;  Service: Ophthalmology;  Laterality: Left;  CDE=16.27  . Lymph node biopsy Right 07/11/2012    Procedure: CERVICAL LYMPH NODE BIOPSY;  Surgeon: Marlane Hatcher, MD;  Location: [facility] ORS;  Service: General;  Laterality: Right;  end @ 1101  . Portacath placement Right 07/11/2012    Procedure: INSERTION PORT-A-CATH;  Surgeon: Marlane Hatcher, MD;  Location: [facility] ORS;  Service: General;  Laterality: Right;  . Port-a-cath removal Right 08/05/2012    Procedure: REMOVAL PORT-A-CATH;  Surgeon: Shelly Rubenstein, MD;  Location: [facility] OR;  Service: General;  Laterality: Right;  :  Current Facility-Administered Medications  Medication Dose Route Frequency Provider Last Rate Last Dose  . 0.45 % sodium chloride infusion   Intravenous Continuous Calvert Cantor, MD 125 mL/hr at 08/09/12 1354 1,000 mL at 08/09/12 1354  . acetaminophen (TYLENOL) suppository 650 mg  650 mg Rectal Q4H PRN Lonia Blood, MD      . antiseptic oral rinse (BIOTENE) solution 15 mL  15 mL Mouth Rinse q12n4p Coralyn Helling, MD   15 mL at 08/09/12 1200  . bisacodyl (  DULCOLAX) suppository 10 mg  10 mg Rectal Q1500 Lonia Blood, MD   10 mg at 08/08/12 1725  . ceFAZolin (ANCEF) IVPB 2 g/50 mL premix  2 g Intravenous Q8H Arman Filter, RPH   2 g at 08/09/12 1356  . chlorhexidine (PERIDEX) 0.12 % solution 15 mL  15 mL Mouth Rinse BID Coralyn Helling, MD   15 mL at 08/09/12 0734  . diltiazem (CARDIZEM) 100 mg in dextrose 5 % 100 mL infusion  5-15 mg/hr Intravenous Titrated Calvert Cantor, MD 15 mL/hr at 08/09/12 1354 15 mg/hr at 08/09/12 1354  . enoxaparin (LOVENOX) injection 150 mg  150 mg Subcutaneous Q12H Lonia Blood, MD   150 mg at 08/09/12  0610  . heparin lock flush 100 unit/mL  250 Units Intracatheter PRN Ellouise Newer, PA-C      . morphine 2 MG/ML injection 2-4 mg  2-4 mg Intravenous Q2H PRN Lonia Blood, MD   2 mg at 08/07/12 1653  . ondansetron (ZOFRAN) injection 4 mg  4 mg Intravenous Q4H PRN Lonia Blood, MD   4 mg at 08/06/12 1542  . pantoprazole (PROTONIX) injection 40 mg  40 mg Intravenous Q24H Lonia Blood, MD   40 mg at 08/08/12 1721  . promethazine (PHENERGAN) injection 12.5 mg  12.5 mg Intravenous Q6H PRN Lonia Blood, MD   12.5 mg at 08/06/12 1414  . sodium chloride 0.9 % injection 10-40 mL  10-40 mL Intracatheter PRN Lonia Blood, MD      . sodium chloride 0.9 % injection 10-40 mL  10-40 mL Intracatheter Q12H Lonia Blood, MD   40 mL at 08/09/12 1000  . sodium chloride 0.9 % injection 3 mL  3 mL Intracatheter PRN Ellouise Newer, PA-C          Allergies  Allergen Reactions  . Ace Inhibitors Other (See Comments)    cough  . Rocephin (Ceftriaxone Sodium) Hives  . Beta Adrenergic Blockers Rash  :  Family History  Problem Relation Age of Onset  . Anesthesia problems Neg Hx   . Hypotension Neg Hx   . Malignant hyperthermia Neg Hx   . Pseudochol deficiency Neg Hx   . Breast cancer Mother   . Heart disease Mother   :  History   Social History  . Marital Status: Married    Spouse Name: N/A    Number of Children: N/A  . Years of Education: N/A   Occupational History  . Not on file.   Social History Main Topics  . Smoking status: Never Smoker   . Smokeless tobacco: Never Used  . Alcohol Use: No  . Drug Use: No  . Sexually Active: Yes    Birth Control/ Protection: None     Comment: married   Other Topics Concern  . Not on file   Social History Narrative  . No narrative on file  :  Pertinent items are noted in HPI.  Exam: ECOG 1 Blood pressure 140/63, pulse 103, temperature 97.6 F (36.4 C), temperature source Oral, resp. rate 19, height 5' 8.5" (1.74  m), weight 332 lb 14.3 oz (151 kg), SpO2 95.00%. General appearance: alert and cooperative Head: Normocephalic, without obvious abnormality, atraumatic Nose: Nares normal. Septum midline. Mucosa normal. No drainage or sinus tenderness. Throat: lips, mucosa, and tongue normal; teeth and gums normal Neck: no adenopathy, no carotid bruit, no JVD, supple, symmetrical, trachea midline and thyroid not enlarged, symmetric, no tenderness/mass/nodules Resp: clear to  auscultation bilaterally Cardio: regular rate and rhythm, S1, S2 normal, no murmur, click, rub or gallop GI: soft, non-tender; bowel sounds normal; no masses,  no organomegaly Extremities: extremities normal, atraumatic, no cyanosis or edema   Recent Labs  08/08/12 0411 08/09/12 0930  WBC 0.7* 0.9*  HGB 7.2* 7.3*  HCT 22.7* 23.4*  PLT 81* 86*    Recent Labs  08/08/12 0411 08/09/12 0930  NA 151* 149*  K 3.6 3.6  CL 118* 116*  CO2 24 24  GLUCOSE 115* 111*  BUN 57* 53*  CREATININE 2.04* 2.14*  CALCIUM 8.1* 8.1*      Dg Chest Port 1 View  08/07/2012   *RADIOLOGY REPORT*  Clinical Data: None.  Left upper extremity PICC.  PORTABLE CHEST - 1 VIEW  Comparison: 07/31/2012.  Findings: Cardiopericardial silhouette is mildly enlarged. Airspace disease and atelectasis at the right lung base.  Right IJ central line is unchanged.  Enteric tube is present.  Left upper extremity PICC is new and the tip is in the mid SVC, just inferior to the level of the carina. Sclerosis of the left humerus is present and there are severe degenerative changes of left and right glenohumeral joints.  IMPRESSION: New left upper extremity PICC with the tip in the mid superior vena cava just inferior to the level of the carina.  Interval placement of enteric tube with the tip not visualized.   Original Report Authenticated By: Andreas Newport, M.D.   Dg Chest Portable 1 View  07/31/2012   *RADIOLOGY REPORT*  Clinical Data: Central line placement  PORTABLE  CHEST - 1 VIEW  Comparison: Portable exam 1401 hours compared to 1214 hours  Findings: New right jugular central venous catheter with tip projecting over SVC. Right subclavian Port-A-Cath again identified. Enlargement of cardiac silhouette with pulmonary vascular congestion. Lungs grossly clear. No pleural effusion, pneumothorax or acute osseous findings. Lateral left costophrenic angle excluded.  IMPRESSION: Enlargement of cardiac silhouette with pulmonary vascular congestion. No pneumothorax following right jugular line placement.   Original Report Authenticated By: Ulyses Southward, M.D.   Dg Chest Port 1 View  07/31/2012   *RADIOLOGY REPORT*  Clinical Data: Sepsis, hypotension, history colon cancer, CLL, hypertension, chronic kidney disease, atrial fibrillation  PORTABLE CHEST - 1 VIEW  Comparison: Portable exam 1214 hours compared to 07/11/2012  Findings: Right subclavian Port-A-Cath stable tip projecting over SVC. Enlargement of cardiac silhouette with pulmonary vascular congestion. No gross infiltrate, pleural effusion or pneumothorax. Minimal peribronchial thickening. Pronounced left glenohumeral degenerative changes.  IMPRESSION: Enlargement of cardiac silhouette with pulmonary vascular congestion. No acute infiltrate.   Original Report Authenticated By: Ulyses Southward, M.D.   Dg Chest Portable 1 View  07/11/2012   *RADIOLOGY REPORT*  Clinical Data: Port-A-Cath placement, lymphoma  PORTABLE CHEST - 1 VIEW  Comparison: Portable exam 1205 hours without priors for comparison  Findings: Right subclavian Port-A-Cath with tip projecting over SVC. Marked enlargement of cardiac silhouette. Tortuous aorta. Pulmonary vascularity normal. Atelectasis mid-to-lower left lung. No gross pleural effusion or pneumothorax. Right glenohumeral degenerative changes.  IMPRESSION: No pneumothorax following right subclavian Port-A-Cath insertion. Enlargement of cardiac silhouette. Atelectasis at mid-to-lower left lung.  Findings  discussed with Dr. Malvin Johns at 1220 hours on 07/11/2012.   Original Report Authenticated By: Ulyses Southward, M.D.   Dg Abd Portable 1v  08/06/2012   *RADIOLOGY REPORT*  Clinical Data: NG tube placement.  Dilated bowel.  PORTABLE ABDOMEN - 1 VIEW 7:24 p.m.  Comparison: 08/06/2012 at 6:53 a.m.  Findings: NG tube has  been inserted.  The tip is in the fundus of the stomach and appears to be just beyond the gastroesophageal junction.  There is extensive air in the large and small bowel.  IMPRESSION: NG tube tip just beyond the gastroesophageal junction.   Original Report Authenticated By: Francene Boyers, M.D.   Dg Abd Portable 1v  08/06/2012   *RADIOLOGY REPORT*  Clinical Data: Ileus  PORTABLE ABDOMEN - 1 VIEW  Comparison: August 03, 2012  Findings: There is persistent generalized bowel dilatation with small bowel loops slightly more dilated compared to recent prior study.  No free air is seen on this supine examination.  There is moderate stool in the colon.  IMPRESSION: Slight increase in generalized bowel dilatation.  Suspect ileus, although a degree of obstruction is not excluded on this study.  No free air is seen on this supine examination.   Original Report Authenticated By: Bretta Bang, M.D.   Dg Abd Portable 2v  08/03/2012   *RADIOLOGY REPORT*  Clinical Data: Vomiting.  PORTABLE ABDOMEN - 2 VIEW  Comparison: None  Findings: There is diffuse gaseous distention of both large and small bowel.  I suspect represents ileus.  No free air.  No organomegaly or suspicious calcification.  No acute bony abnormality.  IMPRESSION: Diffuse gaseous distention of bowel, likely ileus.   Original Report Authenticated By: Charlett Nose, M.D.   Dg C-arm 1-60 Min-no Report  07/11/2012   CLINICAL DATA: port-a-cath insertion   C-ARM 1-60 MINUTES  Fluoroscopy was utilized by the requesting physician.  No radiographic  interpretation.     Assessment and Plan:    67 year old gentleman with the following issues:  1. Small  lymphocytic lymphoma/CLL diagnosed in July of 2014. He is status post the first cycle of bendamustine and rituximab was given on July 17. He received a total of 270 mg of bendamustine along with rituximab. Patient developed prolonged pancytopenia and sepsis as outlined above. From a lymphoma standpoint, no further intervention is needed at this point as is clearly needs to recover from this particular episode and probably will resume his second cycle of therapy upon his discharge are probably with a reduced doses of bendamustine.  2. Prolonged pancytopenia: Is as undoubtedly related to his chemotherapy his baseline counts were slightly low with a total count of 86,000 white cell count 33.8 but after chemotherapy as I expected to have this episode of pancytopenia but there may be slightly prolonged at this time. I see no need for any blood product transfusions at this time. I see no reason for platelet transfusion and certainly would consider red cell transfusion if clinically indicated by his cardiovascular status.  As for his neutropenia, his total white cell count is improving gradually and anticipates will recover with an absolute neutrophil count of 500 in the next 2-3 days. We can consider adding Neupogen total of 480 mcg daily to expedite this if he becomes overwhelmingly septic in the next few days. I think that will help him on subsequent cycles of which were it will help him with this cycle as his white cell count recovery as well on its way.  3. MSSA bacteremia scan on antibiotics and his Port-A-Cath have been removed. He is also going to have MRI done in the near future I appreciate the input from infectious disease at this point.  I will be happy to answer any questions regarding Alex Petty's case to his discharge from the hospital where he will followup at [facility]

## 2012-08-09 NOTE — Progress Notes (Signed)
Physical Therapy Treatment Patient Details Name: Alex Petty MRN: [id number] DOB: 1945-11-19 Today's Date: 08/09/2012 Time: 1478-2956 PT Time Calculation (min): 26 min  PT Assessment / Plan / Recommendation  History of Present Illness 67 yo morbidly obese male admitted 7/21 for septic shock with neutropenia. Patient started on vanc/primaxin/acyclovir/micafungin, but narrowed to current vancomycin and cefazolin for cultures revealing staph aureus. Source likely R thigh vesicular lesions/R foot   PT Comments   Pt con't to make slow progress towards goals. Pt with increased sitting tolerance this date. Pt also able to tolerate HOB at 25 deg today compared to 10 deg 2 days ago. Pt greatly limited by low back pain. Pt may need SNF placement if back pain can not be controlled. If back pain improves anticipate pt to progress to functional level for safe d/c home.   Follow Up Recommendations  Home health PT;Supervision/Assistance - 24 hour     Does the patient have the potential to tolerate intense rehabilitation     Barriers to Discharge        Equipment Recommendations   (TBA)    Recommendations for Other Services    Frequency Min 3X/week   Progress towards PT Goals Progress towards PT goals: Progressing toward goals  Plan Current plan remains appropriate    Precautions / Restrictions Precautions Precautions: Fall Restrictions Weight Bearing Restrictions: No   Pertinent Vitals/Pain 0/10 back pain at rest. 10/10 back pain with sitting    Mobility  Bed Mobility Bed Mobility: Rolling Right;Rolling Left;Left Sidelying to Sit;Sit to Sidelying Left Rolling Right: 2: Max assist Rolling Left: 2: Max assist Left Sidelying to Sit: 1: +2 Total assist;With rails;HOB flat Left Sidelying to Sit: Patient Percentage: 50% Sit to Sidelying Left: 2: Max assist;HOB flat;With rail Details for Bed Mobility Assistance: assist to achieve full sidelying, assist for trunk elevation and LE  management to decrease strain on back Transfers Transfers: Not assessed Ambulation/Gait Ambulation/Gait Assistance: Not tested (comment)    Exercises     PT Diagnosis:    PT Problem List:   PT Treatment Interventions:     PT Goals (current goals can now be found in the care plan section)    Visit Information  Last PT Received On: 08/09/12 Assistance Needed: +2 History of Present Illness: 67 yo morbidly obese male admitted 7/21 for septic shock with neutropenia. Patient started on vanc/primaxin/acyclovir/micafungin, but narrowed to current vancomycin and cefazolin for cultures revealing staph aureus. Source likely R thigh vesicular lesions/R foot    Subjective Data  Subjective: pt received supine in bed and reports pain to be slightly better this date   Cognition  Cognition Arousal/Alertness: Awake/alert Behavior During Therapy: WFL for tasks assessed/performed Overall Cognitive Status: Within Functional Limits for tasks assessed    Balance  Static Sitting Balance Static Sitting - Balance Support: Bilateral upper extremity supported;Feet supported Static Sitting - Level of Assistance: 4: Min assist Static Sitting - Comment/# of Minutes: pt tolerated 2 min prior to requesting to lay down due to low back pain  End of Session PT - End of Session Equipment Utilized During Treatment: Oxygen Activity Tolerance: Patient limited by pain Patient left: in bed;with call bell/phone within reach Nurse Communication: Mobility status   GP     Marcene Brawn 08/09/2012, 2:20 PM   Lewis Shock, PT, DPT Pager #: [phone] Office #: [phone]

## 2012-08-09 NOTE — Progress Notes (Signed)
Per report, pt NGT came out in MRI. Informed that MD's were leaning towards removing NGT. No note noted, unaware if MD's are aware. Pt denies nausea or vomiting.  Donnamarie Poag, NP notified and made aware. Will continue to monitor pt.

## 2012-08-09 NOTE — Progress Notes (Signed)
Nutrition Brief Note  Malnutrition Screening Tool result is inaccurate.  Please consult if nutrition needs are identified.  Katie Baylei Siebels, RD, LDN Pager #: [phone] After-Hours Pager #: [phone]  

## 2012-08-09 NOTE — Progress Notes (Signed)
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 He developed rash on right thigh originally thought to be zoster. He was started on zovirax as outpt. He developed progressive weakness to the point he had trouble walking. He was becoming more confused. He had fever to 103F. Family took him to [facility] ED. He was found to be hypotensive, febrile, anemic, and neutropenic. He had CVL placed, was given IV fluid, transfused 1 unit PRBC, and started on ABx. He was transfered to [facility] for further assessment.   While at [facility], the pt has been diagnosed w/ Staph bacteremia.  As a result, his Port-A-Cath has been removed.  He has also required tx for an ileus.    SIGNIFICANT EVENTS:  7/21 - Transfer to [facility] on PCCM service, PRBC transfusion  7/23 - [facility] assumed care of pt  7/25 - pt developed hematemesis - INR found to be >6.0 7/25-7/26 - transfused FFP, PRBC, and Vitamin K 10mg  7/26 - to OR for Port-A-Cath removal 7/27 - NG placed  Assessment/Plan:  Neutropenic fever with Sepsis - Staph aureus bacteremia  did not require pressors - lactate cleared - possible sources are Rt thigh lesions and right foot - confirmed to be staph on 7/23 - Port-A-Cath removed 7/26 - ID directing abx and extent of further w/u required - TTE without acute findings or suggestion of valvular involvement - lumbar MRI ordered due to lumbar pain which apparently has been present since admission  Back pain - pt unable to sit up in bed for more than 2 min - MRI pending  Illeus - low grade coffee ground emesis KUB confirmed ileus - suspect pt has suffered mild mallory-weiss tear due to vomiting in setting of coagulopathy, vs/ chemo associated mucositis - keep npo - NG tube for decompression has produced 5+L of bilious fluid - get him up to chair - cont NG  - still quite a bit of drainage-  f/u KUB in AM - cont to stimulate bowels from below - Dulcolax 10 mg PR daily and Protonix 40 mg IV Q12 - clamp tube today and follow  Coagulopathy 2nd to chronic coumadin for A fib INR was markedly elevated at 6.74 in setting of coffee ground emesis - was given Vitamin K 10mg  + 4 U FFP for reversal - resumed anticoag w/ lovenox on 7/28, - CBC stable -  NG output non-bloody  Pustular folliculitis Cont abx coverage - rash has essentially resolved  Venous stasis dermatitis  Cellulitis of leg, left resolved w/ abx tx  Pancytopenia 2nd to chemotherapy for CLL neupogen x 1 on 7/21 - WBC still about the same- consult oncology  Chronic Afib cont IV Cardizem while npo - rate reasonably controlled at this time   Hyperglycemia Likely stress response - A1c 5.5   Acute on chronic renal failure - Cr increased to 3.06 on 7/21 baseline cr 2.5 - crt is currently better than his baseline  Hyperkalemia resolved  Acute toxic metabolic encephalopathy  2nd to sepsis - now resolved - follow clinically - folate is normal - B12 low at 276 so replacement has been ordered    Hx of HTN BP well controlled at this time/borderline hypotensive - follow   Access CVL removed today-  PICC In place  Code Status: FULL Family Communication: spoke w/ wife at bedside Disposition Plan: SDU  Consultants: PCCM >> [facility] ID Gen Surgery   Procedures: Rt IJ CVL 7/21 >>   Antibiotics: Cefazolin 7/22 >> Primaxin 7/21 >> 7/22 Acyclovir 7/21 >> 7/22 Micafungin 7/21 >> 7/22 Vancomycin 7/21 >> 7/24  DVT prophylaxis: SCDs >>  lovenox  HPI/Subjective: Wanting NG out. No other complaints- awaiting MRI of back.   Objective: Blood pressure 140/69, pulse 119, temperature 97.6 F (36.4 C), temperature source Oral, resp. rate 14, height 5' 8.5" (1.74 m), weight 151 kg (332 lb 14.3 oz), SpO2 96.00%.  Intake/Output Summary (Last 24 hours) at 08/09/12 1120 Last data filed at  08/09/12 0900  Gross per 24 hour  Intake   3140 ml  Output   3200 ml  Net    -60 ml    Exam: General: No acute respiratory distress at rest in bed  Lungs: Clear to auscultation bilaterally without wheezes or crackles - BS are distant Cardiovascular: tachycardic - no appreciable gallup or M but HS are quite distant Abdomen: morbidly obese, nontender, protuberant, soft, bowel sounds slow, no rebound, no ascites, no appreciable mass Extremities: change of chronic venous stasis B - 1+ edema B LE - charcot joints B ankles  Data Reviewed: Basic Metabolic Panel:  Recent Labs Lab 08/03/12 0500 08/05/12 0936 08/06/12 0558 08/08/12 0411 08/09/12 0930  NA 135 140 141 151* 149*  K 4.3 4.2 4.1 3.6 3.6  CL 103 109 110 118* 116*  CO2 23 20 23 24 24   GLUCOSE 130* 118* 116* 115* 111*  BUN 62* 64* 59* 57* 53*  CREATININE 1.93* 1.78* 1.72* 2.04* 2.14*  CALCIUM 8.2* 8.1* 8.3* 8.1* 8.1*  MG  --  2.0  --   --   --   PHOS  --  3.0  --   --   --    Liver Function Tests:  Recent Labs Lab 08/03/12 0500 08/05/12 0936 08/08/12 0411  AST 7 10 6   ALT <5 <5 <5  ALKPHOS 35* 40 49  BILITOT 0.5 1.1 1.9*  PROT 4.8* 4.6* 4.2*  ALBUMIN 2.0* 1.9* 1.7*   CBC:  Recent Labs Lab 08/05/12 1635 08/06/12 0558 08/07/12 0500 08/08/12 0411 08/09/12 0930  WBC 0.5* 0.6* 0.8* 0.7* 0.9*  HGB 7.5* 7.7* 7.5* 7.2* 7.3*  HCT 22.5* 23.3* 23.3* 22.7* 23.4*  MCV 92.2 92.8 94.0 95.0 95.9  PLT 66* 74* 85* 81* 86*   CBG:  Recent Labs Lab 08/06/12 2123 08/07/12 0805 08/07/12 1144 08/07/12 1540 08/07/12 2109  GLUCAP 116* 124* 117* 116* 103*    Recent Results (from the past 240 hour(s))  CULTURE, BLOOD (ROUTINE X 2)     Status: None   Collection Time    07/31/12 12:23 PM      Result Value Range Status   Specimen Description BLOOD LEFT FOREARM   Final   Special Requests     Final   Value: BOTTLES DRAWN AEROBIC AND ANAEROBIC AEB=12CC ANA=8CC   Culture  Setup Time 08/02/2012 00:26   Final   Culture      Final   Value: STAPHYLOCOCCUS AUREUS     Note: RIFAMPIN AND GENTAMICIN SHOULD NOT BE USED AS SINGLE DRUGS FOR TREATMENT OF STAPH INFECTIONS.     Note: Gram Stain Report Called to,Read Back By and Verified With:  MASON J AT 1115 ON 08/01/2012 BY Ginette Pitman Performed at [facility]   Report Status 08/03/2012 FINAL   Final   Organism ID, Bacteria STAPHYLOCOCCUS AUREUS   Final  CULTURE, BLOOD (ROUTINE X 2)  Status: None   Collection Time    07/31/12 12:27 PM      Result Value Range Status   Specimen Description BLOOD PORTA CATH DRAWN BY RN LT   Final   Special Requests BOTTLES DRAWN AEROBIC AND ANAEROBIC [facility]   Final   Culture  Setup Time 08/02/2012 00:30   Final   Culture     Final   Value: STAPHYLOCOCCUS AUREUS     Note: SUSCEPTIBILITIES PERFORMED ON PREVIOUS CULTURE WITHIN THE LAST 5 DAYS.     Note: Gram Stain Report Called to,Read Back By and Verified With: MASON J AT 1115 ON 08/01/2012 BY Ginette Pitman Performed at [facility]   Report Status 08/03/2012 FINAL   Final  URINE CULTURE     Status: None   Collection Time    07/31/12 12:55 PM      Result Value Range Status   Specimen Description URINE, CLEAN CATCH   Final   Special Requests NONE   Final   Culture  Setup Time 08/01/2012 03:22   Final   Colony Count NO GROWTH   Final   Culture NO GROWTH   Final   Report Status 08/02/2012 FINAL   Final  MRSA PCR SCREENING     Status: None   Collection Time    07/31/12  6:02 PM      Result Value Range Status   MRSA by PCR NEGATIVE  NEGATIVE Final   Comment:            The GeneXpert MRSA Assay (FDA     approved for NASAL specimens     only), is one component of a     comprehensive MRSA colonization     surveillance program. It is not     intended to diagnose MRSA     infection nor to guide or     monitor treatment for     MRSA infections.  CULTURE, BLOOD (ROUTINE X 2)     Status: None   Collection Time    08/02/12  5:25 PM      Result Value Range Status   Specimen  Description BLOOD RIGHT ARM   Final   Special Requests BOTTLES DRAWN AEROBIC AND ANAEROBIC 10CC   Final   Culture  Setup Time 08/02/2012 22:29   Final   Culture NO GROWTH 5 DAYS   Final   Report Status 08/08/2012 FINAL   Final  CULTURE, BLOOD (ROUTINE X 2)     Status: None   Collection Time    08/02/12  5:35 PM      Result Value Range Status   Specimen Description BLOOD RIGHT ARM   Final   Special Requests BOTTLES DRAWN AEROBIC AND ANAEROBIC 10CC   Final   Culture  Setup Time 08/02/2012 22:30   Final   Culture NO GROWTH 5 DAYS   Final   Report Status 08/08/2012 FINAL   Final     Studies:  Recent x-ray studies have been reviewed in detail by the Attending Physician  Scheduled Meds:  Scheduled Meds: . antiseptic oral rinse  15 mL Mouth Rinse q12n4p  . bisacodyl  10 mg Rectal Q1500  .  ceFAZolin (ANCEF) IV  2 g Intravenous Q8H  . chlorhexidine  15 mL Mouth Rinse BID  . enoxaparin (LOVENOX) injection  150 mg Subcutaneous Q12H  . pantoprazole (PROTONIX) IV  40 mg Intravenous Q24H  . sodium chloride  10-40 mL Intracatheter Q12H    Time spent on care of  this patient: 35 mins   Calvert Cantor, MD  [facility] Office  [phone] Pager - Text Page per Loretha Stapler as per below:  On-Call/Text Page:      [email or web address]      password [email or web address]  If 7PM-7AM, please contact night-coverage [email or web address] Password [email or web address] 08/09/2012, 11:20 AM   LOS: 9 days

## 2012-08-10 DIAGNOSIS — M4646 Discitis, unspecified, lumbar region: Secondary | ICD-10-CM | POA: Diagnosis present

## 2012-08-10 DIAGNOSIS — A4901 Methicillin susceptible Staphylococcus aureus infection, unspecified site: Secondary | ICD-10-CM

## 2012-08-10 DIAGNOSIS — M519 Unspecified thoracic, thoracolumbar and lumbosacral intervertebral disc disorder: Secondary | ICD-10-CM

## 2012-08-10 DIAGNOSIS — K6812 Psoas muscle abscess: Secondary | ICD-10-CM

## 2012-08-10 LAB — BASIC METABOLIC PANEL
CO2: 24 mEq/L (ref 19–32)
Chloride: 117 mEq/L — ABNORMAL HIGH (ref 96–112)
Creatinine, Ser: 2.31 mg/dL — ABNORMAL HIGH (ref 0.50–1.35)
Glucose, Bld: 121 mg/dL — ABNORMAL HIGH (ref 70–99)

## 2012-08-10 MED ORDER — BOOST PLUS PO LIQD
237.0000 mL | Freq: Three times a day (TID) | ORAL | Status: DC
  Administered 2012-08-10 – 2012-08-17 (×22): 237 mL via ORAL
  Filled 2012-08-10 (×27): qty 237

## 2012-08-10 MED ORDER — DILTIAZEM HCL ER COATED BEADS 120 MG PO CP24
120.0000 mg | ORAL_CAPSULE | ORAL | Status: DC
  Administered 2012-08-10 – 2012-08-11 (×2): 120 mg via ORAL
  Filled 2012-08-10 (×3): qty 1

## 2012-08-10 MED ORDER — FAMOTIDINE 20 MG PO TABS
20.0000 mg | ORAL_TABLET | Freq: Two times a day (BID) | ORAL | Status: DC
  Administered 2012-08-10 – 2012-08-17 (×15): 20 mg via ORAL
  Filled 2012-08-10 (×16): qty 1

## 2012-08-10 MED ORDER — DILTIAZEM HCL 100 MG IV SOLR
5.0000 mg/h | INTRAVENOUS | Status: DC
  Filled 2012-08-10: qty 100

## 2012-08-10 NOTE — Progress Notes (Signed)
[facility]  Day 11 cefazolin    Subjective: No new complaints   Antibiotics:  Anti-infectives   Start     Dose/Rate Route Frequency Ordered Stop   08/02/12 1400  vancomycin (VANCOCIN) 2,000 mg in sodium chloride 0.9 % 500 mL IVPB  Status:  Discontinued     2,000 mg 250 mL/hr over 120 Minutes Intravenous Every 48 hours 07/31/12 2041 08/01/12 1124   08/01/12 1800  vancomycin (VANCOCIN) 2,000 mg in sodium chloride 0.9 % 500 mL IVPB  Status:  Discontinued     2,000 mg 250 mL/hr over 120 Minutes Intravenous Every 24 hours 08/01/12 1124 08/03/12 1433   08/01/12 1730  ceFAZolin (ANCEF) IVPB 2 g/50 mL premix     2 g 100 mL/hr over 30 Minutes Intravenous 3 times per day 08/01/12 1621     08/01/12 1200  acyclovir (ZOVIRAX) 750 mg in dextrose 5 % 150 mL IVPB  Status:  Discontinued     750 mg 165 mL/hr over 60 Minutes Intravenous Every 12 hours 08/01/12 1124 08/01/12 1544   08/01/12 0000  imipenem-cilastatin (PRIMAXIN) 250 mg in sodium chloride 0.9 % 100 mL IVPB  Status:  Discontinued     250 mg 200 mL/hr over 30 Minutes Intravenous 4 times per day 07/31/12 2041 08/01/12 1544   07/31/12 2115  vancomycin (VANCOCIN) IVPB 1000 mg/200 mL premix     1,000 mg 200 mL/hr over 60 Minutes Intravenous NOW 07/31/12 2041 07/31/12 2215   07/31/12 2030  acyclovir (ZOVIRAX) 1,000 mg in dextrose 5 % 150 mL IVPB  Status:  Discontinued     1,000 mg 170 mL/hr over 60 Minutes Intravenous Every 24 hours 07/31/12 1921 08/01/12 1124   07/31/12 2000  micafungin (MYCAMINE) 150 mg in sodium chloride 0.9 % 100 mL IVPB  Status:  Discontinued     150 mg 100 mL/hr over 1 Hours Intravenous Daily 07/31/12 1921 08/01/12 1544   07/31/12 1315  imipenem-cilastatin (PRIMAXIN) 500 mg in sodium chloride 0.9 % 100 mL IVPB     500 mg 200 mL/hr over 30 Minutes Intravenous  Once 07/31/12 1306 07/31/12 1627   07/31/12 1300  vancomycin (VANCOCIN) IVPB 1000 mg/200 mL premix     1,000 mg 200 mL/hr over  60 Minutes Intravenous  Once 07/31/12 1252 07/31/12 1627      Medications: Scheduled Meds: . antiseptic oral rinse  15 mL Mouth Rinse q12n4p  . bisacodyl  10 mg Rectal Q1500  .  ceFAZolin (ANCEF) IV  2 g Intravenous Q8H  . chlorhexidine  15 mL Mouth Rinse BID  . enoxaparin (LOVENOX) injection  150 mg Subcutaneous Q12H  . pantoprazole (PROTONIX) IV  40 mg Intravenous Q24H  . sodium chloride  10-40 mL Intracatheter Q12H   Continuous Infusions: . sodium chloride 125 mL/hr at 08/10/12 0030  . diltiazem (CARDIZEM) infusion 15 mg/hr (08/10/12 0410)   PRN Meds:.acetaminophen, heparin lock flush, morphine injection, ondansetron, promethazine, sodium chloride   Objective: Weight change:   Intake/Output Summary (Last 24 hours) at 08/10/12 0941 Last data filed at 08/10/12 0919  Gross per 24 hour  Intake      0 ml  Output    851 ml  Net   -851 ml   Blood pressure 111/65, pulse 109, temperature 98 F (36.7 C), temperature source Oral, resp. rate 20, height 5' 8.5" (1.74 m), weight 332 lb 14.3 oz (151 kg), SpO2 98.00%. Temp:  [97.6 F (36.4 C)-98.6 F (37 C)] 98 F (36.7  C) (07/31 2952) Pulse Rate:  [96-110] 109 (07/31 0838) Resp:  [15-20] 20 (07/31 0838) BP: (111-140)/(57-89) 111/65 mmHg (07/31 0838) SpO2:  [95 %-98 %] 98 % (07/31 0838)  Physical Exam: General: Alert and awake, oriented x3, wearing CPAP HEENT: anicteric sclera, pupils reactive to light and accommodation, EOMI CVS regular rate, normal r,  no murmur rubs or gallops Chest: clear to auscultation bilaterally, no wheezing, rales or rhonchi Abdomen: soft nontender, nondistended, normal bowel sounds, Extremities: no  clubbing or edema noted bilaterally Skin: erythema surrounding left chronic skin changes, also changes (chronic left side) Neuro: nonfocal  Lab Results:  Recent Labs  08/08/12 0411 08/09/12 0930  WBC 0.7* 0.9*  HGB 7.2* 7.3*  HCT 22.7* 23.4*  PLT 81* 86*    BMET  Recent Labs  08/09/12 0930  08/10/12 0540  NA 149* 148*  K 3.6 3.4*  CL 116* 117*  CO2 24 24  GLUCOSE 111* 121*  BUN 53* 51*  CREATININE 2.14* 2.31*  CALCIUM 8.1* 7.9*    Micro Results: Recent Results (from the past 240 hour(s))  CULTURE, BLOOD (ROUTINE X 2)     Status: None   Collection Time    07/31/12 12:23 PM      Result Value Range Status   Specimen Description BLOOD LEFT FOREARM   Final   Special Requests     Final   Value: BOTTLES DRAWN AEROBIC AND ANAEROBIC AEB=12CC ANA=8CC   Culture  Setup Time 08/02/2012 00:26   Final   Culture     Final   Value: STAPHYLOCOCCUS AUREUS     Note: RIFAMPIN AND GENTAMICIN SHOULD NOT BE USED AS SINGLE DRUGS FOR TREATMENT OF STAPH INFECTIONS.     Note: Gram Stain Report Called to,Read Back By and Verified With:  MASON J AT 1115 ON 08/01/2012 BY Ginette Pitman Performed at [facility]   Report Status 08/03/2012 FINAL   Final   Organism ID, Bacteria STAPHYLOCOCCUS AUREUS   Final  CULTURE, BLOOD (ROUTINE X 2)     Status: None   Collection Time    07/31/12 12:27 PM      Result Value Range Status   Specimen Description BLOOD PORTA CATH DRAWN BY RN LT   Final   Special Requests BOTTLES DRAWN AEROBIC AND ANAEROBIC [facility]   Final   Culture  Setup Time 08/02/2012 00:30   Final   Culture     Final   Value: STAPHYLOCOCCUS AUREUS     Note: SUSCEPTIBILITIES PERFORMED ON PREVIOUS CULTURE WITHIN THE LAST 5 DAYS.     Note: Gram Stain Report Called to,Read Back By and Verified With: MASON J AT 1115 ON 08/01/2012 BY Ginette Pitman Performed at [facility]   Report Status 08/03/2012 FINAL   Final  URINE CULTURE     Status: None   Collection Time    07/31/12 12:55 PM      Result Value Range Status   Specimen Description URINE, CLEAN CATCH   Final   Special Requests NONE   Final   Culture  Setup Time 08/01/2012 03:22   Final   Colony Count NO GROWTH   Final   Culture NO GROWTH   Final   Report Status 08/02/2012 FINAL   Final  MRSA PCR SCREENING     Status: None   Collection  Time    07/31/12  6:02 PM      Result Value Range Status   MRSA by PCR NEGATIVE  NEGATIVE Final   Comment:  The GeneXpert MRSA Assay (FDA     approved for NASAL specimens     only), is one component of a     comprehensive MRSA colonization     surveillance program. It is not     intended to diagnose MRSA     infection nor to guide or     monitor treatment for     MRSA infections.  CULTURE, BLOOD (ROUTINE X 2)     Status: None   Collection Time    08/02/12  5:25 PM      Result Value Range Status   Specimen Description BLOOD RIGHT ARM   Final   Special Requests BOTTLES DRAWN AEROBIC AND ANAEROBIC 10CC   Final   Culture  Setup Time 08/02/2012 22:29   Final   Culture NO GROWTH 5 DAYS   Final   Report Status 08/08/2012 FINAL   Final  CULTURE, BLOOD (ROUTINE X 2)     Status: None   Collection Time    08/02/12  5:35 PM      Result Value Range Status   Specimen Description BLOOD RIGHT ARM   Final   Special Requests BOTTLES DRAWN AEROBIC AND ANAEROBIC 10CC   Final   Culture  Setup Time 08/02/2012 22:30   Final   Culture NO GROWTH 5 DAYS   Final   Report Status 08/08/2012 FINAL   Final    Studies/Results: Dg Eye Foreign Body  08/09/2012   *RADIOLOGY REPORT*  Clinical Data: Pre MRI.  History well and working with metal.  ORBITS FOR FOREIGN BODY - 2 VIEW  Comparison: None.  Findings: No radiopaque foreign body is projected over either orbit.  An NG tube is in place.  IMPRESSION: No radiopaque foreign body projected over either orbit.   Original Report Authenticated By: Marin Roberts, M.D.   Mr Lumbar Spine Wo Contrast  08/09/2012   *RADIOLOGY REPORT*  Clinical Data: Staph aureus bacteremia.  Acute low back pain.  MRI LUMBAR SPINE WITHOUT CONTRAST  Technique:  Multiplanar and multiecho pulse sequences of the lumbar spine were obtained without intravenous contrast.  Comparison: MRI lumbar spine without and with contrast 03/05/2004.  Findings: Normal signal is present in the  conus medullaris which terminates at T12-L1, within normal limits.  Fluid signal is present in the disc space at L3-4 and L4-5. Chronic fatty end plate marrow changes are present at L3-4.  The patient is status post laminectomy and remote discectomy at this level.  There is diffuse soft tissue within the ventral epidural space posterior to the L4 and particularly the L5 vertebral body.  This is of similar signal to the L4 disc space.  Edematous end plate marrow changes are present at L4-5.  Retroperitoneal lymph nodes are present.  At this asymmetric left- sided edematous changes extend into the psoas muscle.  The disc levels at L2-3 above are normal.  L3-4:  Chronic end plate osteophytes and facet hypertrophy contribute to mild foraminal stenosis bilaterally.  Left laminectomy was performed.  Mild right lateral recess narrowing is present.  L4-5:  The ventral epidural soft tissue compatible with disc material or infection displaces the thecal sac posteriorly.  This results in moderate central canal stenosis.  Mild foraminal narrowing is present bilaterally.  Diffuse asymmetric left-sided edematous changes are present.  There is fluid in the facet joints bilaterally.  L5-S1:  Minimal fluid signal is present in this disc as well.  The patient is status post left laminectomy.  There is fluid in  the left facet joint.  A pars defect is present on the left.  This is chronic.  IMPRESSION:  1.  Diffuse fluid signal throughout the L3-4 and L4-5 lumbar discs suggesting infection. 2.  Contiguous heterogeneous soft tissue extending into the ventral epidural space at L4 and L5 and narrowing the central canal is concerning for extruded disc material or infected flight month. 3.  Asymmetric edematous changes within the left psoas muscle, the left L4-5 and L5-S1 facet joints and posterior musculature is concerning for infection. 4.  Postoperative changes at L3-4 and L5-S1.  These results were called by telephone on 08/09/2012 at  06:50 p.m. to Dr. Luciana Axe, who verbally acknowledged these results.   Original Report Authenticated By: Marin Roberts, M.D.   Dg Abd Portable 1v  08/10/2012   *RADIOLOGY REPORT*  Clinical Data: Follow-up of ileus.  PORTABLE ABDOMEN - 1 VIEW  Comparison: 08/06/2012  Findings: There is worsening gaseous dilatation of the small bowel with maximal caliber of small bowel approaching 6.5 cm.  Previously visualized nasogastric tube appears to have been removed.  Findings are worrisome for worsening small bowel obstruction rather than ileus.  IMPRESSION: Worsening small bowel dilatation consistent with small bowel obstruction.  Previously seen nasogastric tube appears to have been removed.  Maximal small bowel caliber approaches 6.5 cm.   Original Report Authenticated By: Irish Lack, M.D.      Assessment/Plan: Alex Petty is a 67 y.o. male with  CLL undergoing chemotherapy with pancytopenia and now MSSA bacteremia and L3-S1 disk, facet infection, psoas muscle infection  #1 MSSA bacteremia with Lumbar diskitis and psoas muscle abscess:  --I would have Neuro or Ortho spine formally consulted to review the MRI findings though I doubt they would operate in this gentleman with no Neuro findings at present, their assistance may be needed down the road if he fails medical therapy  --HE NEEDS MINIMUM OF 8 WEEKS OF IV CEFAZOLIN with day #1 being day of first negative blood culture (08/02/12)  --He will need repeat MRI of the spine prior to stopping the IV antibiotics to ensure psoas infection clearing and no progression of diskitis (keeping in mind MRI can look worse despite clinical improvement)  --He will need weekly cbc and bmp faxed to Dr. Orvan Falconer # [phone]  I will arrange HSFU in our clinic in the next 2 weeks and prior to end of his abx course tentative stop date is September 16th  #2 Screening: check hep c and HIV   LOS: 10 days   Acey Lav 08/10/2012, 9:41 AM

## 2012-08-10 NOTE — Progress Notes (Addendum)
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 He developed rash on right thigh originally thought to be zoster. He was started on zovirax as outpt. He developed progressive weakness to the point he had trouble walking. He was becoming more confused. He had fever to 103F. Family took him to [facility] ED. He was found to be hypotensive, febrile, anemic, and neutropenic. He had CVL placed, was given IV fluid, transfused 1 unit PRBC, and started on ABx. He was transfered to [facility] for further assessment.   While at [facility], the pt has been diagnosed w/ Staph bacteremia.  As a result, his Port-A-Cath has been removed.  He has also required tx for an ileus.    SIGNIFICANT EVENTS:  7/21 - Transfer to [facility] on PCCM service, PRBC transfusion  7/23 - [facility] assumed care of pt  7/25 - pt developed hematemesis - INR found to be >6.0 7/25-7/26 - transfused FFP, PRBC, and Vitamin K 10mg  7/26 - to OR for Port-A-Cath removal 7/27 - NG placed  Assessment/Plan:  Neutropenic fever with Sepsis - Staph aureus bacteremia  did not require pressors - lactate cleared - possible sources are Rt thigh lesions and right foot - confirmed to be staph on 7/23 - Port-A-Cath removed 7/26 - TTE without acute findings or suggestion of valvular involvement -  MRI L spine Reveals discitis and psoas muscle inflammation- will call NS consult - per ID, HE NEEDS MINIMUM OF 8 WEEKS OF IV CEFAZOLIN with day #1 being day of first negative blood culture (08/02/12)  --He will need repeat MRI of the spine prior to stopping the IV antibiotics to ensure psoas infection clearing and no progression of diskitis (keeping in mind MRI can look worse despite clinical improvement)  Back pain - MRI as above reveals lumbar discitis - per pt, pain has improved significantly since  admission  Illeus - low grade coffee ground emesis KUB confirmed ileus - suspect pt has suffered mild mallory-weiss tear due to vomiting in setting of coagulopathy, vs/ chemo associated mucositis - keep npo - NG tube for decompression has produced 5+L of bilious fluid - get him up to chair - cont NG - still quite a bit of drainage-  f/u KUB in AM - cont to stimulate bowels from below - Dulcolax 10 mg PR daily and Protonix 40 mg IV Q12 - NG tube came out yesterday- tolerating clear liquids since last night- will advance to full liquids   Coagulopathy 2nd to chronic coumadin for A fib INR was markedly elevated at 6.74 in setting of coffee ground emesis - was given Vitamin K 10mg  + 4 U FFP for reversal - resumed anticoag w/ lovenox on 7/28, - CBC stable -  NG output non-bloody  Pustular folliculitis Cont abx coverage - rash has essentially resolved  Venous stasis dermatitis  Cellulitis of leg, left resolved w/ abx tx  Pancytopenia 2nd to chemotherapy for CLL neupogen x 1 on 7/21 - WBC still about the same- consulted oncology- see Dr Alver Fisher note  Chronic Afib Will switch from IV to PO Cardizem as ileus resolved - rate reasonably controlled at this time   Hyperglycemia Likely stress response - A1c 5.5   Acute on chronic renal failure - Cr increased to 3.06 on 7/21 baseline cr 2.5 - crt is currently better than his baseline  Hyperkalemia  resolved  Acute toxic metabolic encephalopathy  2nd to sepsis - now resolved - follow clinically - folate is normal - B12 low at 276 so replacement has been ordered    Hx of HTN BP well controlled at this time/borderline hypotensive - follow   Access CVL removed today-  PICC In place  Code Status: FULL Family Communication: spoke w/ wife at bedside Disposition Plan: SDU  Consultants: PCCM >> [facility] ID Gen Surgery   Procedures: Rt IJ CVL 7/21 >>   Antibiotics: Cefazolin 7/22 >> Primaxin 7/21 >> 7/22 Acyclovir 7/21 >> 7/22 Micafungin  7/21 >> 7/22 Vancomycin 7/21 >> 7/24  DVT prophylaxis: SCDs >>  lovenox  HPI/Subjective: No nausea- drinking well- had 2 BMs today  Objective: Blood pressure 105/69, pulse 95, temperature 98 F (36.7 C), temperature source Oral, resp. rate 13, height 5' 8.5" (1.74 m), weight 151 kg (332 lb 14.3 oz), SpO2 100.00%.  Intake/Output Summary (Last 24 hours) at 08/10/12 1546 Last data filed at 08/10/12 1426  Gross per 24 hour  Intake    650 ml  Output    502 ml  Net    148 ml    Exam: General: No acute respiratory distress at rest in bed  Lungs: Clear to auscultation bilaterally without wheezes or crackles - BS are distant Cardiovascular: tachycardic - no appreciable gallup or M but HS are quite distant Abdomen: morbidly obese, nontender, protuberant, soft, bowel sounds slow, no rebound, no ascites, no appreciable mass Extremities: change of chronic venous stasis B - 1+ edema B LE - charcot joints B ankles  Data Reviewed: Basic Metabolic Panel:  Recent Labs Lab 08/05/12 0936 08/06/12 0558 08/08/12 0411 08/09/12 0930 08/10/12 0540  NA 140 141 151* 149* 148*  K 4.2 4.1 3.6 3.6 3.4*  CL 109 110 118* 116* 117*  CO2 20 23 24 24 24   GLUCOSE 118* 116* 115* 111* 121*  BUN 64* 59* 57* 53* 51*  CREATININE 1.78* 1.72* 2.04* 2.14* 2.31*  CALCIUM 8.1* 8.3* 8.1* 8.1* 7.9*  MG 2.0  --   --   --   --   PHOS 3.0  --   --   --   --    Liver Function Tests:  Recent Labs Lab 08/05/12 0936 08/08/12 0411  AST 10 6  ALT <5 <5  ALKPHOS 40 49  BILITOT 1.1 1.9*  PROT 4.6* 4.2*  ALBUMIN 1.9* 1.7*   CBC:  Recent Labs Lab 08/05/12 1635 08/06/12 0558 08/07/12 0500 08/08/12 0411 08/09/12 0930  WBC 0.5* 0.6* 0.8* 0.7* 0.9*  HGB 7.5* 7.7* 7.5* 7.2* 7.3*  HCT 22.5* 23.3* 23.3* 22.7* 23.4*  MCV 92.2 92.8 94.0 95.0 95.9  PLT 66* 74* 85* 81* 86*   CBG:  Recent Labs Lab 08/06/12 2123 08/07/12 0805 08/07/12 1144 08/07/12 1540 08/07/12 2109  GLUCAP 116* 124* 117* 116* 103*     Recent Results (from the past 240 hour(s))  MRSA PCR SCREENING     Status: None   Collection Time    07/31/12  6:02 PM      Result Value Range Status   MRSA by PCR NEGATIVE  NEGATIVE Final   Comment:            The GeneXpert MRSA Assay (FDA     approved for NASAL specimens     only), is one component of a     comprehensive MRSA colonization     surveillance program. It is not     intended to diagnose  MRSA     infection nor to guide or     monitor treatment for     MRSA infections.  CULTURE, BLOOD (ROUTINE X 2)     Status: None   Collection Time    08/02/12  5:25 PM      Result Value Range Status   Specimen Description BLOOD RIGHT ARM   Final   Special Requests BOTTLES DRAWN AEROBIC AND ANAEROBIC 10CC   Final   Culture  Setup Time 08/02/2012 22:29   Final   Culture NO GROWTH 5 DAYS   Final   Report Status 08/08/2012 FINAL   Final  CULTURE, BLOOD (ROUTINE X 2)     Status: None   Collection Time    08/02/12  5:35 PM      Result Value Range Status   Specimen Description BLOOD RIGHT ARM   Final   Special Requests BOTTLES DRAWN AEROBIC AND ANAEROBIC 10CC   Final   Culture  Setup Time 08/02/2012 22:30   Final   Culture NO GROWTH 5 DAYS   Final   Report Status 08/08/2012 FINAL   Final     Studies:  Recent x-ray studies have been reviewed in detail by the Attending Physician  Scheduled Meds:  Scheduled Meds: . antiseptic oral rinse  15 mL Mouth Rinse q12n4p  .  ceFAZolin (ANCEF) IV  2 g Intravenous Q8H  . chlorhexidine  15 mL Mouth Rinse BID  . diltiazem  120 mg Oral BH-q7a  . enoxaparin (LOVENOX) injection  150 mg Subcutaneous Q12H  . famotidine  20 mg Oral BID  . lactose free nutrition  237 mL Oral TID WC  . sodium chloride  10-40 mL Intracatheter Q12H    Time spent on care of this patient: 35 mins   Calvert Cantor, MD  [facility] Office  [phone] Pager - Text Page per Loretha Stapler as per below:  On-Call/Text Page:      [email or web address]      password  [email or web address]  If 7PM-7AM, please contact night-coverage [email or web address] Password [facility] 08/10/2012, 3:46 PM   LOS: 10 days

## 2012-08-11 DIAGNOSIS — Z113 Encounter for screening for infections with a predominantly sexual mode of transmission: Secondary | ICD-10-CM

## 2012-08-11 DIAGNOSIS — M25469 Effusion, unspecified knee: Secondary | ICD-10-CM

## 2012-08-11 DIAGNOSIS — A4101 Sepsis due to Methicillin susceptible Staphylococcus aureus: Principal | ICD-10-CM

## 2012-08-11 DIAGNOSIS — M4640 Discitis, unspecified, site unspecified: Secondary | ICD-10-CM

## 2012-08-11 DIAGNOSIS — M009 Pyogenic arthritis, unspecified: Secondary | ICD-10-CM

## 2012-08-11 DIAGNOSIS — M109 Gout, unspecified: Secondary | ICD-10-CM

## 2012-08-11 LAB — HEPATITIS C ANTIBODY (REFLEX): HCV Ab: NEGATIVE

## 2012-08-11 LAB — CBC
Hemoglobin: 7.6 g/dL — ABNORMAL LOW (ref 13.0–17.0)
Platelets: 128 10*3/uL — ABNORMAL LOW (ref 150–400)
RBC: 2.3 MIL/uL — ABNORMAL LOW (ref 4.22–5.81)
RBC: 2.54 MIL/uL — ABNORMAL LOW (ref 4.22–5.81)
RDW: 18.7 % — ABNORMAL HIGH (ref 11.5–15.5)
WBC: 1.6 10*3/uL — ABNORMAL LOW (ref 4.0–10.5)

## 2012-08-11 LAB — C-REACTIVE PROTEIN: CRP: 8.7 mg/dL — ABNORMAL HIGH (ref <0.60)

## 2012-08-11 LAB — BASIC METABOLIC PANEL
Calcium: 7.8 mg/dL — ABNORMAL LOW (ref 8.4–10.5)
Chloride: 115 mEq/L — ABNORMAL HIGH (ref 96–112)
Creatinine, Ser: 2.54 mg/dL — ABNORMAL HIGH (ref 0.50–1.35)
GFR calc Af Amer: 28 mL/min — ABNORMAL LOW (ref >90)
Sodium: 147 mEq/L — ABNORMAL HIGH (ref 135–145)

## 2012-08-11 LAB — SEDIMENTATION RATE: Sed Rate: 77 mm/hr — ABNORMAL HIGH (ref 0–16)

## 2012-08-11 LAB — HIV-1 RNA QUANT-NO REFLEX-BLD
HIV 1 RNA Quant: <20 copies/mL (ref <20)
HIV-1 RNA Quant, Log: <1.3 {Log} (ref <1.30)

## 2012-08-11 MED ORDER — DILTIAZEM HCL ER COATED BEADS 180 MG PO CP24
180.0000 mg | ORAL_CAPSULE | ORAL | Status: DC
  Administered 2012-08-12 – 2012-08-17 (×6): 180 mg via ORAL
  Filled 2012-08-11 (×7): qty 1

## 2012-08-11 MED ORDER — POTASSIUM CHLORIDE CRYS ER 20 MEQ PO TBCR
40.0000 meq | EXTENDED_RELEASE_TABLET | Freq: Once | ORAL | Status: AC
  Administered 2012-08-11: 40 meq via ORAL
  Filled 2012-08-11: qty 2

## 2012-08-11 MED ORDER — FUROSEMIDE 10 MG/ML IJ SOLN
40.0000 mg | Freq: Two times a day (BID) | INTRAMUSCULAR | Status: DC
  Administered 2012-08-11 (×2): 40 mg via INTRAVENOUS
  Filled 2012-08-11 (×4): qty 4

## 2012-08-11 NOTE — Progress Notes (Signed)
Physical Therapy Treatment Patient Details Name: Alex Petty MRN: [id number] DOB: [birth year] Today's Date: 08/11/2012 Time: 7829-5621 PT Time Calculation (min): 23 min  PT Assessment / Plan / Recommendation  History of Present Illness 67 yo morbidly obese male admitted 7/21 for septic shock with neutropenia. Patient started on vanc/primaxin/acyclovir/micafungin, but narrowed to current vancomycin and cefazolin for cultures revealing staph aureus. Source likely R thigh vesicular lesions/R foot   PT Comments   Patient did roll to Lt side with less assist today.  However, he continues to have back pain and now new pain in Lt. Knee that occurs during mobility.  Today patient unable to sit at EOB due to this pain.   At next session, will coordinate pain meds with PT session to try to minimize pain with hopefully improved mobility.  If continues to have minimal progress, may need to consider LTACH at discharge for continued therapy.   Follow Up Recommendations  Home health PT;Supervision/Assistance - 24 hour     Does the patient have the potential to tolerate intense rehabilitation     Barriers to Discharge        Equipment Recommendations  Other (comment) (TBD)    Recommendations for Other Services    Frequency Min 3X/week   Progress towards PT Goals Progress towards PT goals: Not progressing toward goals - comment (due to pain in back and now Lt. knee)  Plan Current plan remains appropriate    Precautions / Restrictions Precautions Precautions: Fall Restrictions Weight Bearing Restrictions: No   Pertinent Vitals/Pain Pain in back and Lt knee at 7/10, impacting mobility    Mobility  Bed Mobility Bed Mobility: Rolling Right;Rolling Left;Left Sidelying to Sit Rolling Right: 2: Max assist;With rail Rolling Left: 3: Mod assist;With rail Left Sidelying to Sit: 1: +2 Total assist;With rails;HOB flat Left Sidelying to Sit: Patient Percentage: 50% Details for Bed Mobility  Assistance: Patient rolling to Lt side initially.  Verbal cues to use rail.  Assist to flex RLE to assist with rolling.  Able to roll with mod assist today.  Patient had large watery BM (diarrhea).  Cleaned patient in sidelying and informed RN of BM.  Patient attempted to move to sitting from sidelying x2 - unable due to pain in Lt knee and back spasms.  Attempted to have patient move from supine to sit with increased assist - continued to have back spasms.  Returned to supine and notified RN of patient's pain.  Moved patient to [facility], and encouraged him to try to increase HOB to more upright position.  Transfers Transfers: Not assessed      PT Goals (current goals can now be found in the care plan section)    Visit Information  Last PT Received On: 08/11/12 Assistance Needed: +2 History of Present Illness: 67 yo morbidly obese male admitted 7/21 for septic shock with neutropenia. Patient started on vanc/primaxin/acyclovir/micafungin, but narrowed to current vancomycin and cefazolin for cultures revealing staph aureus. Source likely R thigh vesicular lesions/R foot    Subjective Data  Subjective: Patient reports back spasms with attempts at sitting up.   Cognition  Cognition Arousal/Alertness: Awake/alert Behavior During Therapy: WFL for tasks assessed/performed Overall Cognitive Status: Within Functional Limits for tasks assessed    Balance     End of Session PT - End of Session Equipment Utilized During Treatment: Oxygen Activity Tolerance: Patient limited by pain Patient left: in bed;with call bell/phone within reach Nurse Communication: Mobility status;Patient requests pain meds   GP  Vena Austria 08/11/2012, 4:28 PM Durenda Hurt. Renaldo Fiddler, [facility] Acute Rehab Services Pager [phone]

## 2012-08-11 NOTE — Plan of Care (Signed)
Problem: Acute Rehab PT Goals Goal: Pt Will Go Supine/Side To Sit Outcome: Not Progressing Not progressing due to pain.

## 2012-08-11 NOTE — Progress Notes (Signed)
[facility]  Day 12 cefazolin    Subjective: Patient complaining of exquisite left knee plain 6 stating "I think my gout is acting up again"   Antibiotics:  Anti-infectives   Start     Dose/Rate Route Frequency Ordered Stop   08/02/12 1400  vancomycin (VANCOCIN) 2,000 mg in sodium chloride 0.9 % 500 mL IVPB  Status:  Discontinued     2,000 mg 250 mL/hr over 120 Minutes Intravenous Every 48 hours 07/31/12 2041 08/01/12 1124   08/01/12 1800  vancomycin (VANCOCIN) 2,000 mg in sodium chloride 0.9 % 500 mL IVPB  Status:  Discontinued     2,000 mg 250 mL/hr over 120 Minutes Intravenous Every 24 hours 08/01/12 1124 08/03/12 1433   08/01/12 1730  ceFAZolin (ANCEF) IVPB 2 g/50 mL premix     2 g 100 mL/hr over 30 Minutes Intravenous 3 times per day 08/01/12 1621     08/01/12 1200  acyclovir (ZOVIRAX) 750 mg in dextrose 5 % 150 mL IVPB  Status:  Discontinued     750 mg 165 mL/hr over 60 Minutes Intravenous Every 12 hours 08/01/12 1124 08/01/12 1544   08/01/12 0000  imipenem-cilastatin (PRIMAXIN) 250 mg in sodium chloride 0.9 % 100 mL IVPB  Status:  Discontinued     250 mg 200 mL/hr over 30 Minutes Intravenous 4 times per day 07/31/12 2041 08/01/12 1544   07/31/12 2115  vancomycin (VANCOCIN) IVPB 1000 mg/200 mL premix     1,000 mg 200 mL/hr over 60 Minutes Intravenous NOW 07/31/12 2041 07/31/12 2215   07/31/12 2030  acyclovir (ZOVIRAX) 1,000 mg in dextrose 5 % 150 mL IVPB  Status:  Discontinued     1,000 mg 170 mL/hr over 60 Minutes Intravenous Every 24 hours 07/31/12 1921 08/01/12 1124   07/31/12 2000  micafungin (MYCAMINE) 150 mg in sodium chloride 0.9 % 100 mL IVPB  Status:  Discontinued     150 mg 100 mL/hr over 1 Hours Intravenous Daily 07/31/12 1921 08/01/12 1544   07/31/12 1315  imipenem-cilastatin (PRIMAXIN) 500 mg in sodium chloride 0.9 % 100 mL IVPB     500 mg 200 mL/hr over 30 Minutes Intravenous  Once 07/31/12 1306 07/31/12 1627   07/31/12 1300   vancomycin (VANCOCIN) IVPB 1000 mg/200 mL premix     1,000 mg 200 mL/hr over 60 Minutes Intravenous  Once 07/31/12 1252 07/31/12 1627      Medications: Scheduled Meds: . antiseptic oral rinse  15 mL Mouth Rinse q12n4p  .  ceFAZolin (ANCEF) IV  2 g Intravenous Q8H  . chlorhexidine  15 mL Mouth Rinse BID  . [START ON 08/12/2012] diltiazem  180 mg Oral BH-q7a  . enoxaparin (LOVENOX) injection  150 mg Subcutaneous Q12H  . famotidine  20 mg Oral BID  . furosemide  40 mg Intravenous Q12H  . lactose free nutrition  237 mL Oral TID WC  . sodium chloride  10-40 mL Intracatheter Q12H   Continuous Infusions:   PRN Meds:.acetaminophen, heparin lock flush, morphine injection, ondansetron, promethazine, sodium chloride   Objective: Weight change:   Intake/Output Summary (Last 24 hours) at 08/11/12 1419 Last data filed at 08/11/12 1358  Gross per 24 hour  Intake   1070 ml  Output   1052 ml  Net     18 ml   Blood pressure 114/64, pulse 108, temperature 97.6 F (36.4 C), temperature source Oral, resp. rate 22, height 5' 8.5" (1.74 m), weight 332 lb 14.3 oz (151  kg), SpO2 94.00%. Temp:  [97.5 F (36.4 C)-98.5 F (36.9 C)] 97.6 F (36.4 C) (08/01 1247) Pulse Rate:  [64-116] 108 (08/01 1247) Resp:  [13-28] 22 (08/01 1247) BP: (100-131)/(48-76) 114/64 mmHg (08/01 1247) SpO2:  [92 %-100 %] 94 % (08/01 1247)  Physical Exam: General: Alert and awake, oriented x3, wearing CPAP HEENT: anicteric sclera, pupils reactive to light and accommodation, EOMI CVS regular rate, normal r,  no murmur rubs or gallops Chest: clear to auscultation bilaterally, no wheezing, rales or rhonchi Abdomen: soft nontender, nondistended, normal bowel sounds, Extremities: Left knee is warm and exquisitely tender to palpation He also has pain radiating down his back into his lower thigh consistent with his known discitis.  Skin: erythema surrounding left chronic skin changes, also changes (chronic left side) Neuro:  nonfocal  Lab Results:  Recent Labs  08/09/12 0930  WBC 0.9*  HGB 7.3*  HCT 23.4*  PLT 86*    BMET  Recent Labs  08/09/12 0930 08/10/12 0540  NA 149* 148*  K 3.6 3.4*  CL 116* 117*  CO2 24 24  GLUCOSE 111* 121*  BUN 53* 51*  CREATININE 2.14* 2.31*  CALCIUM 8.1* 7.9*    Micro Results: Recent Results (from the past 240 hour(s))  CULTURE, BLOOD (ROUTINE X 2)     Status: None   Collection Time    08/02/12  5:25 PM      Result Value Range Status   Specimen Description BLOOD RIGHT ARM   Final   Special Requests BOTTLES DRAWN AEROBIC AND ANAEROBIC 10CC   Final   Culture  Setup Time 08/02/2012 22:29   Final   Culture NO GROWTH 5 DAYS   Final   Report Status 08/08/2012 FINAL   Final  CULTURE, BLOOD (ROUTINE X 2)     Status: None   Collection Time    08/02/12  5:35 PM      Result Value Range Status   Specimen Description BLOOD RIGHT ARM   Final   Special Requests BOTTLES DRAWN AEROBIC AND ANAEROBIC 10CC   Final   Culture  Setup Time 08/02/2012 22:30   Final   Culture NO GROWTH 5 DAYS   Final   Report Status 08/08/2012 FINAL   Final  CLOSTRIDIUM DIFFICILE BY PCR     Status: None   Collection Time    08/10/12  8:00 PM      Result Value Range Status   C difficile by pcr NEGATIVE  NEGATIVE Final    Studies/Results: Dg Eye Foreign Body  08/09/2012   *RADIOLOGY REPORT*  Clinical Data: Pre MRI.  History well and working with metal.  ORBITS FOR FOREIGN BODY - 2 VIEW  Comparison: None.  Findings: No radiopaque foreign body is projected over either orbit.  An NG tube is in place.  IMPRESSION: No radiopaque foreign body projected over either orbit.   Original Report Authenticated By: Marin Roberts, M.D.   Mr Lumbar Spine Wo Contrast  08/09/2012   *RADIOLOGY REPORT*  Clinical Data: Staph aureus bacteremia.  Acute low back pain.  MRI LUMBAR SPINE WITHOUT CONTRAST  Technique:  Multiplanar and multiecho pulse sequences of the lumbar spine were obtained without intravenous  contrast.  Comparison: MRI lumbar spine without and with contrast 03/05/2004.  Findings: Normal signal is present in the conus medullaris which terminates at T12-L1, within normal limits.  Fluid signal is present in the disc space at L3-4 and L4-5. Chronic fatty end plate marrow changes are present at L3-4.  The  patient is status post laminectomy and remote discectomy at this level.  There is diffuse soft tissue within the ventral epidural space posterior to the L4 and particularly the L5 vertebral body.  This is of similar signal to the L4 disc space.  Edematous end plate marrow changes are present at L4-5.  Retroperitoneal lymph nodes are present.  At this asymmetric left- sided edematous changes extend into the psoas muscle.  The disc levels at L2-3 above are normal.  L3-4:  Chronic end plate osteophytes and facet hypertrophy contribute to mild foraminal stenosis bilaterally.  Left laminectomy was performed.  Mild right lateral recess narrowing is present.  L4-5:  The ventral epidural soft tissue compatible with disc material or infection displaces the thecal sac posteriorly.  This results in moderate central canal stenosis.  Mild foraminal narrowing is present bilaterally.  Diffuse asymmetric left-sided edematous changes are present.  There is fluid in the facet joints bilaterally.  L5-S1:  Minimal fluid signal is present in this disc as well.  The patient is status post left laminectomy.  There is fluid in the left facet joint.  A pars defect is present on the left.  This is chronic.  IMPRESSION:  1.  Diffuse fluid signal throughout the L3-4 and L4-5 lumbar discs suggesting infection. 2.  Contiguous heterogeneous soft tissue extending into the ventral epidural space at L4 and L5 and narrowing the central canal is concerning for extruded disc material or infected flight month. 3.  Asymmetric edematous changes within the left psoas muscle, the left L4-5 and L5-S1 facet joints and posterior musculature is  concerning for infection. 4.  Postoperative changes at L3-4 and L5-S1.  These results were called by telephone on 08/09/2012 at 06:50 p.m. to Dr. Luciana Axe, who verbally acknowledged these results.   Original Report Authenticated By: Marin Roberts, M.D.   Dg Abd Portable 1v  08/10/2012   *RADIOLOGY REPORT*  Clinical Data: Follow-up of ileus.  PORTABLE ABDOMEN - 1 VIEW  Comparison: 08/06/2012  Findings: There is worsening gaseous dilatation of the small bowel with maximal caliber of small bowel approaching 6.5 cm.  Previously visualized nasogastric tube appears to have been removed.  Findings are worrisome for worsening small bowel obstruction rather than ileus.  IMPRESSION: Worsening small bowel dilatation consistent with small bowel obstruction.  Previously seen nasogastric tube appears to have been removed.  Maximal small bowel caliber approaches 6.5 cm.   Original Report Authenticated By: Irish Lack, M.D.      Assessment/Plan: QUINTO TIPPY is a 67 y.o. male with  CLL undergoing chemotherapy with pancytopenia and now MSSA bacteremia and L3-S1 disk, facet infection, psoas muscle infection  #1 MSSA bacteremia with Lumbar diskitis and psoas muscle abscess and now RULE OUT METASTATIC SPREAD TO KNEE  --He needs aspiration of knee for :  #1 cell count and differential--MOST IMPORTANT #2 crystals #3 GS and culture  If he has findings c/w septic knee he will need formal I and D in OR  --HE NEEDS MINIMUM OF 8 WEEKS OF IV CEFAZOLIN with day #1 being day of first negative blood culture (08/02/12)  --He will need repeat MRI of the spine prior to stopping the IV antibiotics to ensure psoas infection clearing and no progression of diskitis (keeping in mind MRI can look worse despite clinical improvement)  --He will need weekly cbc and bmp faxed to Dr. Orvan Falconer # [phone]  I will arrange [facility]SF[facility] in our clinic in the next 2 weeks and prior to end of his  abx course tentative stop date is September  16th  #2 Swollen Knee --see above, Dr. Charlann Boxer to see today  #2 Screening: check hep c and HIV  Dr. Luciana Axe is covering this weekend for ID.   LOS: 11 days   Acey Lav 08/11/2012, 2:19 PM

## 2012-08-11 NOTE — Progress Notes (Addendum)
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 He developed rash on right thigh originally thought to be zoster. He was started on zovirax as outpt. He developed progressive weakness to the point he had trouble walking. He was becoming more confused. He had fever to 103F. Family took him to [facility] ED. He was found to be hypotensive, febrile, anemic, and neutropenic. He had CVL placed, was given IV fluid, transfused 1 unit PRBC, and started on ABx. He was transfered to [facility] for further assessment.   While at [facility], the pt has been diagnosed w/ Staph bacteremia.  As a result, his Port-A-Cath has been removed.  He has also required tx for an ileus.    SIGNIFICANT EVENTS:  7/21 - Transfer to [facility] on PCCM service, PRBC transfusion  7/23 - [facility] assumed care of pt  7/25 - pt developed hematemesis - INR found to be >6.0 7/25-7/26 - transfused FFP, PRBC, and Vitamin K 10mg  7/26 - to OR for Port-A-Cath removal 7/27-7/29 - had NG tube for ileus 7/30-MRI reveals lumbar discitis and infection and left psoas 8/1-developed left knee swelling  Assessment/Plan:  Neutropenic fever with Sepsis - Staph aureus bacteremia  did not require pressors - lactate cleared - possible sources are Rt thigh lesions and right foot - confirmed to be staph on 7/23  - Port-A-Cath removed 7/26  - TTE negative for endocarditis - MRI L spine Reveals discitis and psoas muscle inflammation- neurosurgery called per ID recommendations-Dr. Mikal Plane called me and told me at this time there is no need for surgery and therefore patient does not need a consult - per ID, HE NEEDS MINIMUM OF 8 WEEKS OF IV CEFAZOLIN with day #1 being day of first negative blood culture (08/02/12)   Back pain - MRI reveals lumbar discitis - per pt, pain has improved significantly  since admission --Per ID, He will need repeat MRI of the spine prior to stopping the IV antibiotics to ensure psoas infection clearing and no progression of diskitis (keeping in mind MRI can look worse despite clinical improvement)  Illeus - low grade coffee ground emesis KUB confirmed ileus - suspect pt has suffered mild mallory-weiss tear due to vomiting in setting of coagulopathy, vs/ chemo associated mucositis -  - NG tube came out while patient was having MRI performed -He is tolerating full liquids and therefore will advance to regular diet  Left knee swelling -Concerned that this may be related to current MSSA infection and therefore ID asked for an Ortho consult to aspirate the knee -He also has history of gout which could be causing this-had not been on allopurinol to due to ileus  Coagulopathy 2nd to chronic coumadin for A fib INR was markedly elevated at 6.74 in setting of coffee ground emesis - was given Vitamin K 10mg  + 4 U FFP for reversal - resumed anticoag w/ lovenox on 7/28, - CBC stable -    Fluid overload -Likely due to volume resuscitation for sepsis -Will start to slowly diurese now  Pustular folliculitis Cont abx coverage - rash has essentially resolved  Venous stasis dermatitis  Cellulitis of leg, left resolved w/ abx tx  Pancytopenia 2nd to chemotherapy for CLL neupogen x 1 on 7/21 - improvement finally noted today- consulted oncology- see Dr Alver Fisher note  Chronic Afib Now tolerating PO Cardizem as ileus  resolved - rate is rapid today and therefore I have increased the dose of oral Cardizem  Acute on chronic renal failure - Cr increased to 3.06 on 7/21 baseline cr 2.5 - crt is currently better than his baseline  Hyperkalemia resolved  Acute toxic metabolic encephalopathy  2nd to sepsis - now resolved - follow clinically - folate is normal - B12 low at 276 so replacement has been ordered    Hx of HTN BP well controlled at this time/borderline  hypotensive - follow   Code Status: FULL Family Communication: No family communication today Disposition Plan: SDU  Consultants: PCCM >> [facility] ID Gen Surgery   Procedures: Rt IJ CVL 7/21 >>   Antibiotics: Cefazolin 7/22 >> Primaxin 7/21 >> 7/22 Acyclovir 7/21 >> 7/22 Micafungin 7/21 >> 7/22 Vancomycin 7/21 >> 7/24  DVT prophylaxis: SCDs >>  lovenox  HPI/Subjective: Patient had no complaints for me today however told the ID physician later that his left knee was bothering him  Objective: Blood pressure 114/64, pulse 108, temperature 97.6 F (36.4 C), temperature source Oral, resp. rate 22, height 5' 8.5" (1.74 m), weight 151 kg (332 lb 14.3 oz), SpO2 94.00%.  Intake/Output Summary (Last 24 hours) at 08/11/12 1300 Last data filed at 08/11/12 0900  Gross per 24 hour  Intake   1070 ml  Output    652 ml  Net    418 ml    Exam: General: No acute respiratory distress at rest in bed  Lungs: Clear to auscultation bilaterally without wheezes or crackles - BS are distant Cardiovascular: tachycardic - no appreciable gallup or M but HS are quite distant Abdomen: morbidly obese, nontender, protuberant, soft, bowel sounds slow, no rebound, no ascites, no appreciable mass Extremities: change of chronic venous stasis B - severe edema in thighs/ legs - charcot joints B ankles  Data Reviewed: Basic Metabolic Panel:  Recent Labs Lab 08/05/12 0936 08/06/12 0558 08/08/12 0411 08/09/12 0930 08/10/12 0540  NA 140 141 151* 149* 148*  K 4.2 4.1 3.6 3.6 3.4*  CL 109 110 118* 116* 117*  CO2 20 23 24 24 24   GLUCOSE 118* 116* 115* 111* 121*  BUN 64* 59* 57* 53* 51*  CREATININE 1.78* 1.72* 2.04* 2.14* 2.31*  CALCIUM 8.1* 8.3* 8.1* 8.1* 7.9*  MG 2.0  --   --   --   --   PHOS 3.0  --   --   --   --    Liver Function Tests:  Recent Labs Lab 08/05/12 0936 08/08/12 0411  AST 10 6  ALT <5 <5  ALKPHOS 40 49  BILITOT 1.1 1.9*  PROT 4.6* 4.2*  ALBUMIN 1.9* 1.7*   CBC:  Recent  Labs Lab 08/05/12 1635 08/06/12 0558 08/07/12 0500 08/08/12 0411 08/09/12 0930  WBC 0.5* 0.6* 0.8* 0.7* 0.9*  HGB 7.5* 7.7* 7.5* 7.2* 7.3*  HCT 22.5* 23.3* 23.3* 22.7* 23.4*  MCV 92.2 92.8 94.0 95.0 95.9  PLT 66* 74* 85* 81* 86*   CBG:  Recent Labs Lab 08/06/12 2123 08/07/12 0805 08/07/12 1144 08/07/12 1540 08/07/12 2109  GLUCAP 116* 124* 117* 116* 103*    Recent Results (from the past 240 hour(s))  CULTURE, BLOOD (ROUTINE X 2)     Status: None   Collection Time    08/02/12  5:25 PM      Result Value Range Status   Specimen Description BLOOD RIGHT ARM   Final   Special Requests BOTTLES DRAWN AEROBIC AND ANAEROBIC 10CC   Final  Culture  Setup Time 08/02/2012 22:29   Final   Culture NO GROWTH 5 DAYS   Final   Report Status 08/08/2012 FINAL   Final  CULTURE, BLOOD (ROUTINE X 2)     Status: None   Collection Time    08/02/12  5:35 PM      Result Value Range Status   Specimen Description BLOOD RIGHT ARM   Final   Special Requests BOTTLES DRAWN AEROBIC AND ANAEROBIC 10CC   Final   Culture  Setup Time 08/02/2012 22:30   Final   Culture NO GROWTH 5 DAYS   Final   Report Status 08/08/2012 FINAL   Final  CLOSTRIDIUM DIFFICILE BY PCR     Status: None   Collection Time    08/10/12  8:00 PM      Result Value Range Status   C difficile by pcr NEGATIVE  NEGATIVE Final     Studies:  Recent x-ray studies have been reviewed in detail by the Attending Physician  Scheduled Meds:  Scheduled Meds: . antiseptic oral rinse  15 mL Mouth Rinse q12n4p  .  ceFAZolin (ANCEF) IV  2 g Intravenous Q8H  . chlorhexidine  15 mL Mouth Rinse BID  . [START ON 08/12/2012] diltiazem  180 mg Oral BH-q7a  . enoxaparin (LOVENOX) injection  150 mg Subcutaneous Q12H  . famotidine  20 mg Oral BID  . furosemide  40 mg Intravenous Q12H  . lactose free nutrition  237 mL Oral TID WC  . sodium chloride  10-40 mL Intracatheter Q12H    Time spent on care of this patient: 35 mins   Calvert Cantor,  MD  [facility] Office  [phone] Pager - Text Page per Loretha Stapler as per below:  On-Call/Text Page:      [email or web address]      password [email or web address]  If 7PM-7AM, please contact night-coverage [email or web address] Password [email or web address] 08/11/2012, 1:00 PM   LOS: 11 days

## 2012-08-11 NOTE — Consult Note (Signed)
Reason for Consult:left knee pain Referring Physician: Dr. Butler Denmark, MD  Alex Petty is an 67 y.o. male.  HPI: The patient is a 67 year old male with left knee pain x two days. He has not had any injury or trauma to the left knee. He has never had surgery on the knee. He reports that over the past couple days he has developed pain and swelling in the knee. He has a history of gout and has had a gouty attack in the left knee previously. He reports that the left knee hurts with any movement and is significantly tender over the lateral aspect of the knee.   Past Medical History  Diagnosis Date  . Arthritis   . Hypertension   . DJD (degenerative joint disease)   . DJD (degenerative joint disease), ankle and foot   . Arthritis   . Cellulitis   . Arthritis   . Chronic back pain   . Gout   . Dysrhythmia     afibrillation  . Atrial fibrillation   . Chronic kidney disease   . Colon cancer     2005  . 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 Charcot ankle     dr. Emilio Math    Past Surgical History  Procedure Laterality Date  . Foot surgery      right foot-  . Colon surgery      forcolon cancer, 2005, Dr. Marcell Anger in [place]  . Tonsillectomy    . Cystoscopy with urethral dilatation    . Back surgery      x2  . Cataract extraction w/phaco  01/14/2011    Procedure: CATARACT EXTRACTION PHACO AND INTRAOCULAR LENS PLACEMENT (IOC);  Surgeon: Gemma Payor;  Location: [facility]S;  Service: Ophthalmology;  Laterality: Left;  CDE=16.27  . Lymph node biopsy Right 07/11/2012    Procedure: CERVICAL LYMPH NODE BIOPSY;  Surgeon: Marlane Hatcher, MD;  Location: [facility]S;  Service: General;  Laterality: Right;  end @ 1101  . Portacath placement Right 07/11/2012    Procedure: INSERTION PORT-A-CATH;  Surgeon: Marlane Hatcher, MD;  Location: [facility];  Service: General;  Laterality: Right;  . Port-a-cath removal Right 08/05/2012    Procedure: REMOVAL PORT-A-CATH;  Surgeon: Shelly Rubenstein, MD;   Location: [facility] OR;  Service: General;  Laterality: Right;    Family History  Problem Relation Age of Onset  . Anesthesia problems Neg Hx   . Hypotension Neg Hx   . Malignant hyperthermia Neg Hx   . Pseudochol deficiency Neg Hx   . Breast cancer Mother   . Heart disease Mother     Social History:  reports that he has never smoked. He has never used smokeless tobacco. He reports that he does not drink alcohol or use illicit drugs.  Allergies:  Allergies  Allergen Reactions  . Ace Inhibitors Other (See Comments)    cough  . Rocephin (Ceftriaxone Sodium) Hives  . Beta Adrenergic Blockers Rash    Medications: I have reviewed the patient's current medications.  Results for orders placed during the hospital encounter of 07/31/12 (from the past 48 hour(s))  BASIC METABOLIC PANEL     Status: Abnormal   Collection Time    08/10/12  5:40 AM      Result Value Range   Sodium 148 (*) 135 - 145 mEq/L   Potassium 3.4 (*) 3.5 - 5.1 mEq/L   Chloride 117 (*) 96 - 112 mEq/L   CO2 24  19 - 32  mEq/L   Glucose, Bld 121 (*) 70 - 99 mg/dL   BUN 51 (*) 6 - 23 mg/dL   Creatinine, Ser 1.61 (*) 0.50 - 1.35 mg/dL   Calcium 7.9 (*) 8.4 - 10.5 mg/dL   GFR calc non Af Amer 28 (*) >90 mL/min   GFR calc Af Amer 32 (*) >90 mL/min   Comment:            The eGFR has been calculated     using the CKD EPI equation.     This calculation has not been     validated in all clinical     situations.     eGFR's persistently     <90 mL/min signify     possible Chronic Kidney Disease.  CLOSTRIDIUM DIFFICILE BY PCR     Status: None   Collection Time    08/10/12  8:00 PM      Result Value Range   C difficile by pcr NEGATIVE  NEGATIVE  SEDIMENTATION RATE     Status: Abnormal   Collection Time    08/11/12  5:00 AM      Result Value Range   Sed Rate 77 (*) 0 - 16 mm/hr  C-REACTIVE PROTEIN     Status: Abnormal   Collection Time    08/11/12  5:00 AM      Result Value Range   CRP 8.7 (*) <0.60 mg/dL    Dg  Eye Foreign Body  08/09/2012   *RADIOLOGY REPORT*  Clinical Data: Pre MRI.  History well and working with metal.  ORBITS FOR FOREIGN BODY - 2 VIEW  Comparison: None.  Findings: No radiopaque foreign body is projected over either orbit.  An NG tube is in place.  IMPRESSION: No radiopaque foreign body projected over either orbit.   Original Report Authenticated By: Marin Roberts, M.D.   Mr Lumbar Spine Wo Contrast  08/09/2012   *RADIOLOGY REPORT*  Clinical Data: Staph aureus bacteremia.  Acute low back pain.  MRI LUMBAR SPINE WITHOUT CONTRAST  Technique:  Multiplanar and multiecho pulse sequences of the lumbar spine were obtained without intravenous contrast.  Comparison: MRI lumbar spine without and with contrast 03/05/2004.  Findings: Normal signal is present in the conus medullaris which terminates at T12-L1, within normal limits.  Fluid signal is present in the disc space at L3-4 and L4-5. Chronic fatty end plate marrow changes are present at L3-4.  The patient is status post laminectomy and remote discectomy at this level.  There is diffuse soft tissue within the ventral epidural space posterior to the L4 and particularly the L5 vertebral body.  This is of similar signal to the L4 disc space.  Edematous end plate marrow changes are present at L4-5.  Retroperitoneal lymph nodes are present.  At this asymmetric left- sided edematous changes extend into the psoas muscle.  The disc levels at L2-3 above are normal.  L3-4:  Chronic end plate osteophytes and facet hypertrophy contribute to mild foraminal stenosis bilaterally.  Left laminectomy was performed.  Mild right lateral recess narrowing is present.  L4-5:  The ventral epidural soft tissue compatible with disc material or infection displaces the thecal sac posteriorly.  This results in moderate central canal stenosis.  Mild foraminal narrowing is present bilaterally.  Diffuse asymmetric left-sided edematous changes are present.  There is fluid in the  facet joints bilaterally.  L5-S1:  Minimal fluid signal is present in this disc as well.  The patient is status post left laminectomy.  There is fluid in the left facet joint.  A pars defect is present on the left.  This is chronic.  IMPRESSION:  1.  Diffuse fluid signal throughout the L3-4 and L4-5 lumbar discs suggesting infection. 2.  Contiguous heterogeneous soft tissue extending into the ventral epidural space at L4 and L5 and narrowing the central canal is concerning for extruded disc material or infected flight month. 3.  Asymmetric edematous changes within the left psoas muscle, the left L4-5 and L5-S1 facet joints and posterior musculature is concerning for infection. 4.  Postoperative changes at L3-4 and L5-S1.  These results were called by telephone on 08/09/2012 at 06:50 p.m. to Dr. Luciana Axe, who verbally acknowledged these results.   Original Report Authenticated By: Marin Roberts, M.D.   Dg Abd Portable 1v  08/10/2012   *RADIOLOGY REPORT*  Clinical Data: Follow-up of ileus.  PORTABLE ABDOMEN - 1 VIEW  Comparison: 08/06/2012  Findings: There is worsening gaseous dilatation of the small bowel with maximal caliber of small bowel approaching 6.5 cm.  Previously visualized nasogastric tube appears to have been removed.  Findings are worrisome for worsening small bowel obstruction rather than ileus.  IMPRESSION: Worsening small bowel dilatation consistent with small bowel obstruction.  Previously seen nasogastric tube appears to have been removed.  Maximal small bowel caliber approaches 6.5 cm.   Original Report Authenticated By: Irish Lack, M.D.    Review of Systems  Constitutional: Positive for malaise/fatigue. Negative for fever, chills and diaphoresis.  Respiratory: Positive for shortness of breath. Negative for cough and wheezing.        SOB with exertion  Cardiovascular: Positive for leg swelling. Negative for chest pain and palpitations.  Gastrointestinal: Positive for abdominal  pain.  Genitourinary: Negative for dysuria.  Musculoskeletal: Positive for back pain and joint pain. Negative for falls.       Left knee pain  Skin: Negative.   Neurological: Negative for dizziness, weakness and headaches.   Blood pressure 118/72, pulse 108, temperature 98.5 F (36.9 C), temperature source Oral, resp. rate 20, height 5' 8.5" (1.74 m), weight 151 kg (332 lb 14.3 oz), SpO2 92.00%. Physical Exam  Constitutional: He is oriented to person, place, and time. He is cooperative. No distress.  Morbidly obese  HENT:  Head: Normocephalic and atraumatic.  Eyes: EOM are normal.  Neck: Normal range of motion.  Cardiovascular: Intact distal pulses.   Musculoskeletal:       Right knee: He exhibits decreased range of motion and swelling. He exhibits no erythema. No tenderness found.       Left knee: He exhibits decreased range of motion, swelling and effusion. He exhibits no erythema. Tenderness found.       Right ankle: He exhibits deformity.       Left ankle: He exhibits deformity.  Ankles: Bilateral Charcot Left knee: Patient experiences pain with passive and active motion of the left knee. No erythema about the left knee. No lesions or open wounds. Moderate effusion about the left knee in addition to bilateral 2+ pitting edema in LE. Tenderness greater laterally than medially. No pain with motion of the right knee.   Neurological: He is alert and oriented to person, place, and time.  Skin: Skin is warm and intact. No abrasion and no rash noted. He is not diaphoretic. No erythema.  Psychiatric: He has a normal mood and affect. His behavior is normal.    Assessment/Plan: Left knee pain Patient has multiple medical issues including but not limited to CLL, staph  aureus bacteremia, bilateral Charcot ankles, morbid obesity, and left knee pain. There is an effusion and tenderness to palpation over the left knee but no erythema. No signs of infection within the joint. He does have a history  of gout which is the patient's main concern. The patient would benefit from an aspiration of the left knee, analysis of fluid to follow. Dr. Charlann Boxer will see the patient and perform the aspiration as he sees fit. The patient and his family were informed of this and are in agreement with this plan.   Niamya Vittitow LAUREN 08/11/2012, 12:41 PM

## 2012-08-12 DIAGNOSIS — M519 Unspecified thoracic, thoracolumbar and lumbosacral intervertebral disc disorder: Secondary | ICD-10-CM

## 2012-08-12 LAB — SYNOVIAL CELL COUNT + DIFF, W/ CRYSTALS: Lymphocytes-Synovial Fld: 3 % (ref 0–20)

## 2012-08-12 LAB — BASIC METABOLIC PANEL
CO2: 24 mEq/L (ref 19–32)
Chloride: 115 mEq/L — ABNORMAL HIGH (ref 96–112)
Glucose, Bld: 126 mg/dL — ABNORMAL HIGH (ref 70–99)
Potassium: 3 mEq/L — ABNORMAL LOW (ref 3.5–5.1)
Sodium: 149 mEq/L — ABNORMAL HIGH (ref 135–145)

## 2012-08-12 LAB — GRAM STAIN

## 2012-08-12 MED ORDER — POTASSIUM CHLORIDE CRYS ER 20 MEQ PO TBCR
40.0000 meq | EXTENDED_RELEASE_TABLET | Freq: Once | ORAL | Status: AC
  Administered 2012-08-12: 40 meq via ORAL
  Filled 2012-08-12: qty 2

## 2012-08-12 MED ORDER — VITAMIN B-12 1000 MCG PO TABS
1000.0000 ug | ORAL_TABLET | Freq: Every day | ORAL | Status: DC
  Administered 2012-08-12 – 2012-08-17 (×6): 1000 ug via ORAL
  Filled 2012-08-12 (×6): qty 1

## 2012-08-12 MED ORDER — CYANOCOBALAMIN 1000 MCG/ML IJ SOLN
1000.0000 ug | Freq: Every day | INTRAMUSCULAR | Status: DC
  Filled 2012-08-12: qty 1

## 2012-08-12 NOTE — Progress Notes (Signed)
Patient ID: Alex Petty, male   DOB: 1945-05-17, 67 y.o.   MRN: [id number]  Patient seen yesterday for initial consult to rule out septic joint  At time that I was going to aspirate his knee he had used the restroom in his bed  Returned today, wife at bedside, to perform aspiration  Exam: Left knee tender to touch and with some movement but horrible pain Palpable effusion left knee No significant warmth No cellulitis, erythema  In room today under sterile prep I aspirated about 30cc joint fluid to send for evaluation No obvious concerns for infection but looks to be inflammatory consistent with known diagnosis of gout  Reports being off allopurinol due to renal failure  Fluid sent for cell count, crystal eval, gram stain and culture  If gout defer to medical management  If and only if surprisingly septic joint will need to go to OR but not likely based on above assessment/evaluation  Call with questions or concerns

## 2012-08-12 NOTE — Progress Notes (Signed)
[facility]  Date of Admission:  07/31/2012  Antibiotics: Cefazolin (plan through Sept 16th)  Subjective: Feels better in knee after tap by Dr. Charlann Boxer  Objective: Temp:  [97.6 F (36.4 C)-98.4 F (36.9 C)] 98 F (36.7 C) (08/02 1200) Pulse Rate:  [94-112] 97 (08/02 1200) Resp:  [14-24] 19 (08/02 1200) BP: (112-137)/(50-92) 112/68 mmHg (08/02 1200) SpO2:  [96 %-100 %] 99 % (08/02 1200) Weight:  [322 lb 12.1 oz (146.4 kg)] 322 lb 12.1 oz (146.4 kg) (08/02 0500)  General: Awake, alert, nad Skin: no rashes Lungs: CTA B Cor: RRR without m Abdomen: obese, nt, nd   Lab Results Lab Results  Component Value Date   WBC 1.7* 08/11/2012   HGB 7.6* 08/11/2012   HCT 24.1* 08/11/2012   MCV 94.9 08/11/2012   PLT 133* 08/11/2012    Lab Results  Component Value Date   CREATININE 2.85* 08/12/2012   BUN 53* 08/12/2012   NA 149* 08/12/2012   K 3.0* 08/12/2012   CL 115* 08/12/2012   CO2 24 08/12/2012    Lab Results  Component Value Date   ALT <5 08/08/2012   AST 6 08/08/2012   ALKPHOS 49 08/08/2012   BILITOT 1.9* 08/08/2012      Microbiology: Recent Results (from the past 240 hour(s))  CULTURE, BLOOD (ROUTINE X 2)     Status: None   Collection Time    08/02/12  5:25 PM      Result Value Range Status   Specimen Description BLOOD RIGHT ARM   Final   Special Requests BOTTLES DRAWN AEROBIC AND ANAEROBIC 10CC   Final   Culture  Setup Time 08/02/2012 22:29   Final   Culture NO GROWTH 5 DAYS   Final   Report Status 08/08/2012 FINAL   Final  CULTURE, BLOOD (ROUTINE X 2)     Status: None   Collection Time    08/02/12  5:35 PM      Result Value Range Status   Specimen Description BLOOD RIGHT ARM   Final   Special Requests BOTTLES DRAWN AEROBIC AND ANAEROBIC 10CC   Final   Culture  Setup Time 08/02/2012 22:30   Final   Culture NO GROWTH 5 DAYS   Final   Report Status 08/08/2012 FINAL   Final  CLOSTRIDIUM DIFFICILE BY PCR     Status: None   Collection Time    08/10/12   8:00 PM      Result Value Range Status   C difficile by pcr NEGATIVE  NEGATIVE Final  GRAM STAIN     Status: None   Collection Time    08/12/12  1:06 PM      Result Value Range Status   Specimen Description SYNOVIAL LEFT KNEE   Final   Special Requests NONE   Final   Gram Stain     Final   Value: ABUNDANT WBC PRESENT, PREDOMINANTLY PMN     NO ORGANISMS SEEN     MUHORO D.,RN 08.02.14 1333 BY JONESJ   Report Status 08/12/2012 FINAL   Final    Studies/Results: No results found.  Assessment/Plan: 1) Bacteremia with MSSA and L3-S1 discitis, facet infection, psoas muscle - It appears knee is more c/w gout, though will await culture.   - continue with cefazolin through at least Sept 16  - I will be available over the weekend if needed, otherwise Dr. Daiva Eves will follow up on Monday.     Staci Righter, MD [facility] [email or web address] [phone] pager   [phone] cell 08/12/2012, 1:57 PM

## 2012-08-12 NOTE — Progress Notes (Signed)
[facility]  Patient Status:  Alex Petty is a new pt to [facility] services.   [facility] is providing the following services:   HH Nursing, possible PT and [facility] for home IV Antibiotics upon DC home.  [facility] hospital coordinator team will follow pt during this admission and work with pt/family on in hospital teaching on IV antibiotic administration/PICC care to support independence at home and transition to home when ready.   If patient discharges after hours, please call [phone].   Sedalia Muta 08/12/2012, 6:51 AM

## 2012-08-12 NOTE — Progress Notes (Signed)
Pharmacy - Lovenox per MD  Continues on Lovenox while Coumadin is on hold for Afib Presented on admission with increased INR in setting of coffee ground emesis.  Plan: 1) Continue Lovenox 150 mg sq Q 12 hours 2) Plans for long term anti-coag?  Thank you.  Okey Regal, PharmD

## 2012-08-12 NOTE — Progress Notes (Signed)
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 He developed rash on right thigh originally thought to be zoster. He was started on zovirax as outpt. He developed progressive weakness to the point he had trouble walking. He was becoming more confused. He had fever to 103F. Family took him to [facility]. He was found to be hypotensive, febrile, anemic, and neutropenic. He had CVL placed, was given IV fluid, transfused 1 unit PRBC, and started on ABx. He was transfered to [facility] for further assessment.   While at [facility], the pt has been diagnosed w/ Staph bacteremia.  As a result, his Port-A-Cath has been removed.  He has also required tx for an ileus.    Hospital summary:   7/21 - Transfer to [facility] on PCCM service, PRBC transfusion  7/23 - [facility] assumed care of pt  7/25 - pt developed hematemesis - INR found to be >6.0 7/25-7/26 - transfused FFP, PRBC, and Vitamin K 10mg  7/26 - to OR for Port-A-Cath removal 7/27-7/29 - had NG tube for ileus 7/30-MRI reveals lumbar discitis and infection and left psoas 8/1-developed left knee swelling  Assessment/Plan:  Neutropenic fever with Sepsis - Staph aureus bacteremia  did not require pressors - lactate cleared - possible sources are Rt thigh lesions and right foot - confirmed to be staph on 7/23  - Port-A-Cath removed 7/26  - TTE negative for endocarditis - MRI L spine Reveals discitis and psoas muscle inflammation- neurosurgery called per ID recommendations-Dr. Mikal Plane called me and told me at this time there is no need for surgery and therefore patient does not need a consult - per ID, HE NEEDS MINIMUM OF 8 WEEKS OF IV CEFAZOLIN with day #1 being day of first negative blood culture (08/02/12)   Back pain - MRI reveals lumbar discitis - per pt, pain has improved significantly  since admission --Per ID, He will need repeat MRI of the spine prior to stopping the IV antibiotics to ensure psoas infection clearing and no progression of diskitis (keeping in mind MRI can look worse despite clinical improvement)  Illeus - low grade coffee ground emesis KUB confirmed ileus - suspect pt has suffered mild mallory-weiss tear due to vomiting in setting of coagulopathy, vs/ chemo associated mucositis -  - NG tube came out while patient was having MRI performed -He is a regular diet now but having loose stools due to resumption of solids.   Coagulopathy 2nd to chronic coumadin for A fib INR was markedly elevated at 6.74 in setting of coffee ground emesis - was given Vitamin K 10mg  + 4 U FFP for reversal - resumed anticoag w/ lovenox on 7/28, - CBC stable -    Left knee swelling -Concerned that this may be related to current MSSA infection and therefore ID asked for an Ortho consult to aspirate the knee -He also has history of gout which could be causing this-had not been on allopurinol to due to ileus  Severe dependent edema in legs - gave lasix on 8/1 but renal function worsened and therefore did not give further lasix - per wieht, he is about the same as when he was admitted and per I and O, he is in negative balance by 1 L  Pustular folliculitis Cont abx coverage - rash has essentially resolved  Venous stasis dermatitis  Cellulitis of leg,  left resolved w/ abx tx  Pancytopenia 2nd to chemotherapy for CLL neupogen x 1 on 7/21 - improvement finally noted on 8/1 - consulted oncology- see Dr Alver Fisher note  Chronic Afib Now tolerating PO Cardizem as ileus resolved - rate is rapid today and therefore I have increased the dose of oral Cardizem - on Lovenox- since Hgb is stable and he is now tolerating POs, will resume coumadin.   Acute on chronic renal failure - Cr increased to 3.06 on 7/21 baseline cr 2.8 - crt is currently better than his  baseline  Hyperkalemia resolved  Acute toxic metabolic encephalopathy  -2nd to sepsis - now resolved - - folate is normal -  -B12 low at 276 so replacement has been ordered    Hx of HTN BP well controlled at this time/borderline hypotensive - follow   Code Status: FULL Family Communication: with wife Disposition Plan: SDU  Consultants: PCCM >> [facility] ID Gen Surgery   Procedures: Rt IJ CVL 7/21 >>   Antibiotics: Cefazolin 7/22 >> Primaxin 7/21 >> 7/22 Acyclovir 7/21 >> 7/22 Micafungin 7/21 >> 7/22 Vancomycin 7/21 >> 7/24  DVT prophylaxis: SCDs >>  lovenox  HPI/Subjective: Patient still awaiting knee aspiration- having multiple stools since resumption of diet but not diarrhea  Objective: Blood pressure 112/68, pulse 97, temperature 98 F (36.7 C), temperature source Oral, resp. rate 19, height 5' 8.5" (1.74 m), weight 146.4 kg (322 lb 12.1 oz), SpO2 99.00%.  Intake/Output Summary (Last 24 hours) at 08/12/12 1419 Last data filed at 08/12/12 1401  Gross per 24 hour  Intake    870 ml  Output   1703 ml  Net   -833 ml    Exam: General: No acute respiratory distress at rest in bed  Lungs: Clear to auscultation bilaterally without wheezes or crackles - BS are distant Cardiovascular: tachycardic - no appreciable gallup or M but HS are quite distant Abdomen: morbidly obese, nontender, protuberant, soft, bowel sounds slow, no rebound, no ascites, no appreciable mass Extremities: change of chronic venous stasis B - severe edema in thighs/ legs - charcot joints B ankles  Data Reviewed: Basic Metabolic Panel:  Recent Labs Lab 08/08/12 0411 08/09/12 0930 08/10/12 0540 08/11/12 1420 08/12/12 0500  NA 151* 149* 148* 147* 149*  K 3.6 3.6 3.4* 3.1* 3.0*  CL 118* 116* 117* 115* 115*  CO2 24 24 24 22 24   GLUCOSE 115* 111* 121* 143* 126*  BUN 57* 53* 51* 51* 53*  CREATININE 2.04* 2.14* 2.31* 2.54* 2.85*  CALCIUM 8.1* 8.1* 7.9* 7.8* 8.0*   Liver Function  Tests:  Recent Labs Lab 08/08/12 0411  AST 6  ALT <5  ALKPHOS 49  BILITOT 1.9*  PROT 4.2*  ALBUMIN 1.7*   CBC:  Recent Labs Lab 08/07/12 0500 08/08/12 0411 08/09/12 0930 08/11/12 1420 08/11/12 1700  WBC 0.8* 0.7* 0.9* 1.6* 1.7*  HGB 7.5* 7.2* 7.3* 6.8* 7.6*  HCT 23.3* 22.7* 23.4* 21.8* 24.1*  MCV 94.0 95.0 95.9 94.8 94.9  PLT 85* 81* 86* 128* 133*   CBG:  Recent Labs Lab 08/06/12 2123 08/07/12 0805 08/07/12 1144 08/07/12 1540 08/07/12 2109  GLUCAP 116* 124* 117* 116* 103*    Recent Results (from the past 240 hour(s))  CULTURE, BLOOD (ROUTINE X 2)     Status: None   Collection Time    08/02/12  5:25 PM      Result Value Range Status   Specimen Description BLOOD RIGHT ARM   Final   Special Requests  BOTTLES DRAWN AEROBIC AND ANAEROBIC 10CC   Final   Culture  Setup Time 08/02/2012 22:29   Final   Culture NO GROWTH 5 DAYS   Final   Report Status 08/08/2012 FINAL   Final  CULTURE, BLOOD (ROUTINE X 2)     Status: None   Collection Time    08/02/12  5:35 PM      Result Value Range Status   Specimen Description BLOOD RIGHT ARM   Final   Special Requests BOTTLES DRAWN AEROBIC AND ANAEROBIC 10CC   Final   Culture  Setup Time 08/02/2012 22:30   Final   Culture NO GROWTH 5 DAYS   Final   Report Status 08/08/2012 FINAL   Final  CLOSTRIDIUM DIFFICILE BY PCR     Status: None   Collection Time    08/10/12  8:00 PM      Result Value Range Status   C difficile by pcr NEGATIVE  NEGATIVE Final  GRAM STAIN     Status: None   Collection Time    08/12/12  1:06 PM      Result Value Range Status   Specimen Description SYNOVIAL LEFT KNEE   Final   Special Requests NONE   Final   Gram Stain     Final   Value: ABUNDANT WBC PRESENT, PREDOMINANTLY PMN     NO ORGANISMS SEEN     MUHORO D.,RN 08.02.14 1333 BY JONESJ   Report Status 08/12/2012 FINAL   Final     Studies:  Recent x-ray studies have been reviewed in detail by the Attending Physician  Scheduled  Meds:  Scheduled Meds: . antiseptic oral rinse  15 mL Mouth Rinse q12n4p  .  ceFAZolin (ANCEF) IV  2 g Intravenous Q8H  . chlorhexidine  15 mL Mouth Rinse BID  . diltiazem  180 mg Oral BH-q7a  . enoxaparin (LOVENOX) injection  150 mg Subcutaneous Q12H  . famotidine  20 mg Oral BID  . lactose free nutrition  237 mL Oral TID WC  . sodium chloride  10-40 mL Intracatheter Q12H    Time spent on care of this patient: 35 mins   Calvert Cantor, MD  [facility] Office  [phone] Pager - Text Page per Loretha Stapler as per below:  On-Call/Text Page:      [email or web address]      password [email or web address]  If 7PM-7AM, please contact night-coverage [email or web address] Password [email or web address] 08/12/2012, 2:19 PM   LOS: 12 days

## 2012-08-13 DIAGNOSIS — M109 Gout, unspecified: Secondary | ICD-10-CM

## 2012-08-13 LAB — CBC
HCT: 21.5 % — ABNORMAL LOW (ref 39.0–52.0)
Hemoglobin: 6.6 g/dL — CL (ref 13.0–17.0)
Hemoglobin: 6.8 g/dL — CL (ref 13.0–17.0)
MCV: 95.6 fL (ref 78.0–100.0)
RBC: 2.25 MIL/uL — ABNORMAL LOW (ref 4.22–5.81)
RBC: 2.23 MIL/uL — ABNORMAL LOW (ref 4.22–5.81)
WBC: 3 10*3/uL — ABNORMAL LOW (ref 4.0–10.5)
WBC: 3.2 10*3/uL — ABNORMAL LOW (ref 4.0–10.5)

## 2012-08-13 LAB — BASIC METABOLIC PANEL
CO2: 23 mEq/L (ref 19–32)
Calcium: 7.9 mg/dL — ABNORMAL LOW (ref 8.4–10.5)
Creatinine, Ser: 2.97 mg/dL — ABNORMAL HIGH (ref 0.50–1.35)

## 2012-08-13 LAB — URINALYSIS, ROUTINE W REFLEX MICROSCOPIC
Bilirubin Urine: NEGATIVE
Specific Gravity, Urine: 1.015 (ref 1.005–1.030)
Urobilinogen, UA: 0.2 mg/dL (ref 0.0–1.0)
pH: 5 (ref 5.0–8.0)

## 2012-08-13 LAB — VITAMIN B12: Vitamin B-12: 1566 pg/mL — ABNORMAL HIGH (ref 211–911)

## 2012-08-13 LAB — RETICULOCYTES
RBC.: 2.16 MIL/uL — ABNORMAL LOW (ref 4.22–5.81)
Retic Ct Pct: 2.5 % (ref 0.4–3.1)

## 2012-08-13 LAB — PREPARE RBC (CROSSMATCH)

## 2012-08-13 LAB — URINE MICROSCOPIC-ADD ON

## 2012-08-13 LAB — SODIUM, URINE, RANDOM: Sodium, Ur: 24 mEq/L

## 2012-08-13 MED ORDER — POTASSIUM CHLORIDE CRYS ER 20 MEQ PO TBCR
40.0000 meq | EXTENDED_RELEASE_TABLET | ORAL | Status: AC
  Administered 2012-08-13 (×3): 40 meq via ORAL
  Filled 2012-08-13 (×3): qty 2

## 2012-08-13 MED ORDER — DIPHENOXYLATE-ATROPINE 2.5-0.025 MG PO TABS
2.0000 | ORAL_TABLET | Freq: Once | ORAL | Status: AC
  Administered 2012-08-13: 2 via ORAL
  Filled 2012-08-13: qty 2

## 2012-08-13 MED ORDER — PREDNISONE 20 MG PO TABS
40.0000 mg | ORAL_TABLET | Freq: Every day | ORAL | Status: DC
  Administered 2012-08-14 – 2012-08-17 (×4): 40 mg via ORAL
  Filled 2012-08-13 (×5): qty 2

## 2012-08-13 NOTE — Progress Notes (Signed)
CRITICAL VALUE ALERT  Critical value received: Hgb 6.6  Date of notification: 08/13/12  Time of notification:  1330  Critical value read back: yes  Nurse who received alert:  Arman Bogus RN  MD notified (1st page): Rizwan  Time of first page:  1350  Responding MD:  Dr Butler Denmark received order to redraw

## 2012-08-13 NOTE — Progress Notes (Addendum)
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 He developed rash on right thigh originally thought to be zoster. He was started on zovirax as outpt. He developed progressive weakness to the point he had trouble walking. He was becoming more confused. He had fever to 103F. Family took him to [facility] ED. He was found to be hypotensive, febrile, anemic, and neutropenic. He had CVL placed, was given IV fluid, transfused 1 unit PRBC, and started on ABx. He was transfered to [facility] for further assessment.   While at [facility], the pt has been diagnosed w/ Staph bacteremia.  As a result, his Port-A-Cath has been removed.  He has also required tx for an ileus.    Hospital summary:   7/21 - Transfer to [facility] on PCCM service, PRBC transfusion  7/23 - [facility] assumed care of pt  7/25 - pt developed hematemesis - INR found to be >6.0 7/25-7/26 - transfused FFP, PRBC, and Vitamin K 10mg  7/26 - to OR for Port-A-Cath removal 7/27-7/29 - had NG tube for ileus 7/30-MRI reveals lumbar discitis and infection and left psoas 8/1-developed left knee swelling  Assessment/Plan:  Neutropenic fever with Sepsis - Staph aureus bacteremia  did not require pressors - lactate cleared - possible sources are Rt thigh lesions and right foot - confirmed to be staph on 7/23  - Port-A-Cath removed 7/26  - TTE negative for endocarditis - MRI L spine Reveals discitis and psoas muscle inflammation- neurosurgery called per ID recommendations-Dr. Mikal Plane called me and told me at this time there is no need for surgery and therefore patient does not need a consult - per ID, HE NEEDS MINIMUM OF 8 WEEKS OF IV CEFAZOLIN with day #1 being day of first negative blood culture (08/02/12)   Back pain - MRI reveals lumbar discitis - per pt, pain has improved significantly  since admission --Per ID, He will need repeat MRI of the spine prior to stopping the IV antibiotics to ensure psoas infection clearing and no progression of diskitis (keeping in mind MRI can look worse despite clinical improvement)  Illeus - low grade coffee ground emesis KUB confirmed ileus - suspect pt has suffered mild mallory-weiss tear due to vomiting in setting of coagulopathy, vs/ chemo associated mucositis -  - NG tube came out while patient was having MRI performed -He is a regular diet now but having loose stools due to resumption of solids.  Anemia Hgb dropping steadily - no obvious source of bleeding noted- will give a unit of blood today - check anemia panel for iron deficiency   Coagulopathy 2nd to chronic coumadin for A fib INR was markedly elevated at 6.74 in setting of coffee ground emesis - was given Vitamin K 10mg  + 4 U FFP for reversal - resumed anticoag w/ lovenox on 7/28  And coumadin on 8/2- no obvious bleed noted    Left knee swelling -Concerned that this may be related to current MSSA infection and therefore ID asked for an Ortho consult to aspirate the knee -fluid reveals gout crystals - starting prednisone today x 5 days  Severe dependent edema in legs - gave lasix on 8/1 but renal function worsened and therefore did not give further lasix - per wieght, he is about the same as when he was admitted and per I and O, he is in negative  balance by 1 L  Hypokalemia - having diarrhea- will replace   Diarrhea - started after diet resumed once ileus resolved- c dif neg- give lomotil x 1 today  Pustular folliculitis Cont abx coverage - rash has essentially resolved  Venous stasis dermatitis  Cellulitis of leg, left resolved w/ abx tx  Pancytopenia 2nd to chemotherapy for CLL neupogen x 1 on 7/21 - improvement finally noted on 8/1 - consulted oncology- see Dr Alver Fisher note  Chronic Afib Now tolerating PO Cardizem as ileus resolved -- on Lovenox-   Acute on  chronic renal failure - Cr increased to 3.06 on 7/21 baseline cr 2.8 - crt increasing steadily-  -FeNa was calculated and reveals that this is prerenal -Due to complement and hypernatremia will start him on D5W   Acute toxic metabolic encephalopathy  -2nd to sepsis - now resolved - - folate is normal -  -B12 low at 276 so replacement has been ordered    Hx of HTN BP well controlled at this time/borderline hypotensive - follow   Code Status: FULL Family Communication: with wife Disposition Plan: SDU  Consultants: PCCM >> [facility] ID Gen Surgery   Procedures: Rt IJ CVL 7/21 >>   Antibiotics: Cefazolin 7/22 >> Primaxin 7/21 >> 7/22 Acyclovir 7/21 >> 7/22 Micafungin 7/21 >> 7/22 Vancomycin 7/21 >> 7/24  DVT prophylaxis: SCDs >>  lovenox  HPI/Subjective: Patient still feels knee is better after aspiration- eating well-   Objective: Blood pressure 116/78, pulse 104, temperature 98 F (36.7 C), temperature source Oral, resp. rate 17, height 5' 8.5" (1.74 m), weight 151 kg (332 lb 14.3 oz), SpO2 94.00%.  Intake/Output Summary (Last 24 hours) at 08/13/12 1532 Last data filed at 08/13/12 1414  Gross per 24 hour  Intake    577 ml  Output   1350 ml  Net   -773 ml    Exam: General: No acute respiratory distress at rest in bed  Lungs: Clear to auscultation bilaterally without wheezes or crackles - BS are distant Cardiovascular: tachycardic - no appreciable gallup or M but HS are quite distant Abdomen: morbidly obese, nontender, protuberant, soft, bowel sounds slow, no rebound, no ascites, no appreciable mass Extremities: change of chronic venous stasis B - severe edema in thighs/ legs - charcot joints B ankles  Data Reviewed: Basic Metabolic Panel:  Recent Labs Lab 08/09/12 0930 08/10/12 0540 08/11/12 1420 08/12/12 0500 08/13/12 0500 08/13/12 0930  NA 149* 148* 147* 149* 147*  --   K 3.6 3.4* 3.1* 3.0* 2.9*  --   CL 116* 117* 115* 115* 115*  --   CO2 24 24 22 24  23   --   GLUCOSE 111* 121* 143* 126* 121*  --   BUN 53* 51* 51* 53* 53*  --   CREATININE 2.14* 2.31* 2.54* 2.85* 2.97*  --   CALCIUM 8.1* 7.9* 7.8* 8.0* 7.9*  --   MG  --   --   --   --   --  1.8   Liver Function Tests:  Recent Labs Lab 08/08/12 0411  AST 6  ALT <5  ALKPHOS 49  BILITOT 1.9*  PROT 4.2*  ALBUMIN 1.7*   CBC:  Recent Labs Lab 08/09/12 0930 08/11/12 1420 08/11/12 1700 08/13/12 0930 08/13/12 1435  WBC 0.9* 1.6* 1.7* 3.0* 3.2*  HGB 7.3* 6.8* 7.6* 6.6* 6.8*  HCT 23.4* 21.8* 24.1* 21.5* 21.5*  MCV 95.9 94.8 94.9 95.6 96.4  PLT 86* 128* 133* 168 173   CBG:  Recent  Labs Lab 08/06/12 2123 08/07/12 0805 08/07/12 1144 08/07/12 1540 08/07/12 2109  GLUCAP 116* 124* 117* 116* 103*    Recent Results (from the past 240 hour(s))  CLOSTRIDIUM DIFFICILE BY PCR     Status: None   Collection Time    08/10/12  8:00 PM      Result Value Range Status   C difficile by pcr NEGATIVE  NEGATIVE Final  GRAM STAIN     Status: None   Collection Time    08/12/12  1:06 PM      Result Value Range Status   Specimen Description SYNOVIAL LEFT KNEE   Final   Special Requests NONE   Final   Gram Stain     Final   Value: ABUNDANT WBC PRESENT, PREDOMINANTLY PMN     NO ORGANISMS SEEN     MUHORO D.,RN 08.02.14 1333 BY JONESJ   Report Status 08/12/2012 FINAL   Final  BODY FLUID CULTURE     Status: None   Collection Time    08/12/12  1:06 PM      Result Value Range Status   Specimen Description SYNOVIAL LEFT KNEE   Final   Special Requests NONE   Final   Gram Stain     Final   Value: ABUNDANT WBC PRESENT, PREDOMINANTLY PMN     NO ORGANISMS SEEN     MUHORO D RN 08.02.14 1333 BY JONESJ Performed at [facility]   Culture NO GROWTH 1 DAY   Final   Report Status PENDING   Incomplete     Studies:  Recent x-ray studies have been reviewed in detail by the Attending Physician  Scheduled Meds:  Scheduled Meds: . antiseptic oral rinse  15 mL Mouth Rinse q12n4p  .   ceFAZolin (ANCEF) IV  2 g Intravenous Q8H  . chlorhexidine  15 mL Mouth Rinse BID  . diltiazem  180 mg Oral BH-q7a  . enoxaparin (LOVENOX) injection  150 mg Subcutaneous Q12H  . famotidine  20 mg Oral BID  . lactose free nutrition  237 mL Oral TID WC  . sodium chloride  10-40 mL Intracatheter Q12H  . vitamin B-12  1,000 mcg Oral Daily    Time spent on care of this patient: 35 mins   Calvert Cantor, MD  [facility] Office  [phone] Pager - Text Page per Loretha Stapler as per below:  On-Call/Text Page:      [email or web address]      password [email or web address]  If 7PM-7AM, please contact night-coverage [email or web address] Password [email or web address] 08/13/2012, 3:32 PM   LOS: 13 days

## 2012-08-14 DIAGNOSIS — M109 Gout, unspecified: Secondary | ICD-10-CM

## 2012-08-14 DIAGNOSIS — Y849 Medical procedure, unspecified as the cause of abnormal reaction of the patient, or of later complication, without mention of misadventure at the time of the procedure: Secondary | ICD-10-CM

## 2012-08-14 DIAGNOSIS — T80211A Bloodstream infection due to central venous catheter, initial encounter: Secondary | ICD-10-CM

## 2012-08-14 LAB — HEMOGLOBIN AND HEMATOCRIT, BLOOD
HCT: 23.4 % — ABNORMAL LOW (ref 39.0–52.0)
Hemoglobin: 6.5 g/dL — CL (ref 13.0–17.0)
Hemoglobin: 7.3 g/dL — ABNORMAL LOW (ref 13.0–17.0)

## 2012-08-14 LAB — OCCULT BLOOD X 1 CARD TO LAB, STOOL: Fecal Occult Bld: NEGATIVE

## 2012-08-14 MED ORDER — POTASSIUM CHLORIDE CRYS ER 20 MEQ PO TBCR
20.0000 meq | EXTENDED_RELEASE_TABLET | Freq: Once | ORAL | Status: AC
  Administered 2012-08-14: 20 meq via ORAL
  Filled 2012-08-14: qty 1

## 2012-08-14 MED ORDER — ENOXAPARIN SODIUM 40 MG/0.4ML ~~LOC~~ SOLN
40.0000 mg | SUBCUTANEOUS | Status: DC
Start: 2012-08-15 — End: 2012-08-17
  Administered 2012-08-15 – 2012-08-17 (×3): 40 mg via SUBCUTANEOUS
  Filled 2012-08-14 (×4): qty 0.4

## 2012-08-14 MED ORDER — WARFARIN SODIUM 5 MG PO TABS
5.0000 mg | ORAL_TABLET | Freq: Once | ORAL | Status: DC
  Filled 2012-08-14: qty 1

## 2012-08-14 MED ORDER — WARFARIN - PHARMACIST DOSING INPATIENT: Freq: Every day | Status: DC

## 2012-08-14 NOTE — Progress Notes (Signed)
ANTICOAGULATION CONSULT NOTE - Initial Consult  Pharmacy Consult for Coumadin Indication: atrial fibrillation  Allergies  Allergen Reactions  . Ace Inhibitors Other (See Comments)    cough  . Rocephin (Ceftriaxone Sodium) Hives  . Beta Adrenergic Blockers Rash    Patient Measurements: Height: 5' 8.5" (174 cm) Weight: 343 lb 14.7 oz (156 kg) IBW/kg (Calculated) : 69.55  Vital Signs: Temp: 98.1 F (36.7 C) (08/04 1055) Temp src: Oral (08/04 1055) BP: 105/69 mmHg (08/04 1055) Pulse Rate: 102 (08/04 1055)  Labs:  Recent Labs  08/11/12 1420 08/11/12 1700 08/12/12 0500 08/13/12 0500 08/13/12 0930 08/13/12 1435 08/14/12 0200 08/14/12 1000  HGB 6.8* 7.6*  --   --  6.6* 6.8* 6.5* 7.3*  HCT 21.8* 24.1*  --   --  21.5* 21.5* 21.1* 23.4*  PLT 128* 133*  --   --  168 173  --   --   CREATININE 2.54*  --  2.85* 2.97*  --   --   --   --     Estimated Creatinine Clearance: 35.6 ml/min (by C-G formula based on Cr of 2.97).   Medical History: Past Medical History  Diagnosis Date  . Arthritis   . Hypertension   . DJD (degenerative joint disease)   . DJD (degenerative joint disease), ankle and foot   . Arthritis   . Cellulitis   . Arthritis   . Chronic back pain   . Gout   . Dysrhythmia     afibrillation  . Atrial fibrillation   . Chronic kidney disease   . Colon cancer     2005  . 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 Charcot ankle     dr. Emilio Math    Medications:  Scheduled:  . antiseptic oral rinse  15 mL Mouth Rinse q12n4p  .  ceFAZolin (ANCEF) IV  2 g Intravenous Q8H  . chlorhexidine  15 mL Mouth Rinse BID  . diltiazem  180 mg Oral BH-q7a  . enoxaparin (LOVENOX) injection  150 mg Subcutaneous Q12H  . famotidine  20 mg Oral BID  . lactose free nutrition  237 mL Oral TID WC  . predniSONE  40 mg Oral Q breakfast  . sodium chloride  10-40 mL Intracatheter Q12H  . vitamin B-12  1,000 mcg Oral Daily   Infusions:    Assessment: 67 y/o male  patient being treated for staph bacteremia, on chronic coumadin for h/o afib. Currently receiving lovenox 1mg /kg q12 for treatment, coumadin to resume. Noted, patient was supratherapeutic on admit.  Goal of Therapy:  INR 2-3 Monitor platelets by anticoagulation protocol: Yes   Plan:  Coumadin 5mg  today, f/u daily protime and continue lovenox until INR therapeutic.  Verlene Mayer, PharmD, BCPS Pager [phone] 08/14/2012,12:28 PM

## 2012-08-14 NOTE — Progress Notes (Addendum)
[facility]  Day 15 cefazolin    Subjective: Knee pain is better "   Antibiotics:  Anti-infectives   Start     Dose/Rate Route Frequency Ordered Stop   08/02/12 1400  vancomycin (VANCOCIN) 2,000 mg in sodium chloride 0.9 % 500 mL IVPB  Status:  Discontinued     2,000 mg 250 mL/hr over 120 Minutes Intravenous Every 48 hours 07/31/12 2041 08/01/12 1124   08/01/12 1800  vancomycin (VANCOCIN) 2,000 mg in sodium chloride 0.9 % 500 mL IVPB  Status:  Discontinued     2,000 mg 250 mL/hr over 120 Minutes Intravenous Every 24 hours 08/01/12 1124 08/03/12 1433   08/01/12 1730  ceFAZolin (ANCEF) IVPB 2 g/50 mL premix     2 g 100 mL/hr over 30 Minutes Intravenous 3 times per day 08/01/12 1621     08/01/12 1200  acyclovir (ZOVIRAX) 750 mg in dextrose 5 % 150 mL IVPB  Status:  Discontinued     750 mg 165 mL/hr over 60 Minutes Intravenous Every 12 hours 08/01/12 1124 08/01/12 1544   08/01/12 0000  imipenem-cilastatin (PRIMAXIN) 250 mg in sodium chloride 0.9 % 100 mL IVPB  Status:  Discontinued     250 mg 200 mL/hr over 30 Minutes Intravenous 4 times per day 07/31/12 2041 08/01/12 1544   07/31/12 2115  vancomycin (VANCOCIN) IVPB 1000 mg/200 mL premix     1,000 mg 200 mL/hr over 60 Minutes Intravenous NOW 07/31/12 2041 07/31/12 2215   07/31/12 2030  acyclovir (ZOVIRAX) 1,000 mg in dextrose 5 % 150 mL IVPB  Status:  Discontinued     1,000 mg 170 mL/hr over 60 Minutes Intravenous Every 24 hours 07/31/12 1921 08/01/12 1124   07/31/12 2000  micafungin (MYCAMINE) 150 mg in sodium chloride 0.9 % 100 mL IVPB  Status:  Discontinued     150 mg 100 mL/hr over 1 Hours Intravenous Daily 07/31/12 1921 08/01/12 1544   07/31/12 1315  imipenem-cilastatin (PRIMAXIN) 500 mg in sodium chloride 0.9 % 100 mL IVPB     500 mg 200 mL/hr over 30 Minutes Intravenous  Once 07/31/12 1306 07/31/12 1627   07/31/12 1300  vancomycin (VANCOCIN) IVPB 1000 mg/200 mL premix     1,000 mg 200 mL/hr  over 60 Minutes Intravenous  Once 07/31/12 1252 07/31/12 1627      Medications: Scheduled Meds: . antiseptic oral rinse  15 mL Mouth Rinse q12n4p  .  ceFAZolin (ANCEF) IV  2 g Intravenous Q8H  . chlorhexidine  15 mL Mouth Rinse BID  . diltiazem  180 mg Oral BH-q7a  . enoxaparin (LOVENOX) injection  150 mg Subcutaneous Q12H  . famotidine  20 mg Oral BID  . lactose free nutrition  237 mL Oral TID WC  . predniSONE  40 mg Oral Q breakfast  . sodium chloride  10-40 mL Intracatheter Q12H  . vitamin B-12  1,000 mcg Oral Daily  . warfarin  5 mg Oral ONCE-1800  . Warfarin - Pharmacist Dosing Inpatient   Does not apply q1800   Continuous Infusions:   PRN Meds:.acetaminophen, heparin lock flush, morphine injection, ondansetron, promethazine, sodium chloride   Objective: Weight change: 11 lb 0.4 oz (5 kg)  Intake/Output Summary (Last 24 hours) at 08/14/12 1530 Last data filed at 08/14/12 1436  Gross per 24 hour  Intake   1595 ml  Output    550 ml  Net   1045 ml   Blood pressure 105/63, pulse 92, temperature 98.1  F (36.7 C), temperature source Oral, resp. rate 14, height 5' 8.5" (1.74 m), weight 343 lb 14.7 oz (156 kg), SpO2 98.00%. Temp:  [97.5 F (36.4 C)-98.4 F (36.9 C)] 98.1 F (36.7 C) (08/04 1055) Pulse Rate:  [51-115] 92 (08/04 1400) Resp:  [12-24] 14 (08/04 1400) BP: (90-133)/(51-74) 105/63 mmHg (08/04 1400) SpO2:  [72 %-100 %] 98 % (08/04 1400) Weight:  [343 lb 14.7 oz (156 kg)] 343 lb 14.7 oz (156 kg) (08/03 2326)  Physical Exam: General: Alert and awake, oriented x3, wearing CPAP HEENT: anicteric sclera, pupils reactive to light and accommodation, EOMI CVS regular rate, normal r,  no murmur rubs or gallops Chest: clear to auscultation bilaterally, no wheezing, rales or rhonchi Abdomen: soft nontender, nondistended, normal bowel sounds, Extremities: Left knee is  tender to palpation He also has pain radiating down his back into his lower thigh consistent with his  known discitis.  Skin: erythema surrounding left chronic skin changes, also changes (chronic left side) Neuro: nonfocal  Lab Results:  Recent Labs  08/13/12 0930 08/13/12 1435 08/14/12 0200 08/14/12 1000  WBC 3.0* 3.2*  --   --   HGB 6.6* 6.8* 6.5* 7.3*  HCT 21.5* 21.5* 21.1* 23.4*  PLT 168 173  --   --     BMET  Recent Labs  08/12/12 0500 08/13/12 0500  NA 149* 147*  K 3.0* 2.9*  CL 115* 115*  CO2 24 23  GLUCOSE 126* 121*  BUN 53* 53*  CREATININE 2.85* 2.97*  CALCIUM 8.0* 7.9*    Micro Results: Recent Results (from the past 240 hour(s))  CLOSTRIDIUM DIFFICILE BY PCR     Status: None   Collection Time    08/10/12  8:00 PM      Result Value Range Status   C difficile by pcr NEGATIVE  NEGATIVE Final  GRAM STAIN     Status: None   Collection Time    08/12/12  1:06 PM      Result Value Range Status   Specimen Description SYNOVIAL LEFT KNEE   Final   Special Requests NONE   Final   Gram Stain     Final   Value: ABUNDANT WBC PRESENT, PREDOMINANTLY PMN     NO ORGANISMS SEEN     MUHORO D.,RN 08.02.14 1333 BY JONESJ   Report Status 08/12/2012 FINAL   Final  BODY FLUID CULTURE     Status: None   Collection Time    08/12/12  1:06 PM      Result Value Range Status   Specimen Description SYNOVIAL LEFT KNEE   Final   Special Requests NONE   Final   Gram Stain     Final   Value: ABUNDANT WBC PRESENT, PREDOMINANTLY PMN     NO ORGANISMS SEEN     MUHORO D RN 08.02.14 1333 BY JONESJ Performed at [facility]   Culture NO GROWTH 2 DAYS   Final   Report Status PENDING   Incomplete    Studies/Results: No results found.    Assessment/Plan: Alex Petty is a 67 y.o. male with  CLL undergoing chemotherapy with pancytopenia and now MSSA bacteremia and L3-S1 disk, facet infection, psoas muscle infection  #1 MSSA bacteremia with Lumbar diskitis and psoas muscle abscess and now rule out metastatic infeciton to knee. Knee spreader was more consistent with gout  with crystals and the cell count. No organisms have been grown from the knee and the knee is getting better   --  I NOTICED THAT HIS PICC WAS PLACED ON THE DAY OF HIS BACTEREMIA SO THEREFORE IT IS BY DEFINITION A CONTAMINATED LINE  --PICC NEEDS TO BE DC'D  --BLOOD CULTURE POST REMOVAL OF PICC --CATHETER HOLIDAY WHILE BLOOD CLEARING   --HE NEEDS MINIMUM OF 8 WEEKS OF IV CEFAZOLIN with day #1 NOW BEING FIRST DAY NEGATIVE BLOOD CULTURES AFTER REMOVAL OF PICC  --He will need repeat MRI of the spine prior to stopping the IV antibiotics to ensure psoas infection clearing and no progression of diskitis (keeping in mind MRI can look worse despite clinical improvement)  --He will need weekly cbc and bmp faxed to Dr. Orvan Falconer # [phone]   We would like to see him  next 2 weeks whether it be in [facility] or if he goes to SNF in our clinic and prior to end of his abx course tentative stop date is September 16th  #2 Swollen Knee appears to be gout  #3 Screening: hep c and HIV negative  Dr. Luciana Axe is covering this weekend for ID.   LOS: 14 days   Acey Lav 08/14/2012, 3:30 PM

## 2012-08-14 NOTE — Progress Notes (Signed)
[facility] Progress Note [place] TEAM 1 - Stepdown/ICU TEAM   Alex Petty OZH:[id number] DOB: 09-Sep-1945 DOA: 07/31/2012 PCP: [name], MD  Brief narrative: 67 yo male started on chemo for CLL (bendamustine/rituxan). He developed rash on right thigh originally thought to be zoster. He was started on zovirax as outpt. He developed progressive weakness to the point he had trouble walking. He was becoming more confused. He had fever to 103F. Family took him to [facility] ED. He was found to be hypotensive, febrile, anemic, and neutropenic. He had CVL placed, was given IV fluid, transfused 1 unit PRBC, and started on ABx. He was transfered to [facility] for further assessment.   While at [facility], the pt has been diagnosed w/ Staph bacteremia.  As a result, his Port-A-Cath has been removed.  He has also required tx for an ileus.    Hospital summary:   7/21 - Transfer to [facility] on PCCM service, PRBC transfusion  7/23 - [facility] assumed care of pt  7/25 - pt developed hematemesis - INR found to be >6.0 7/25 - 7/26 - transfused FFP, PRBC, and Vitamin K 10mg  7/26 - to OR for Port-A-Cath removal 7/27 - 7/29 - had NG tube for ileus 7/30 - MRI reveals lumbar discitis and infection and left psoas inflammation  8/1 - 8/2 - developed left knee swelling >> Ortho consult >> L knee aspirate c/w gout   Assessment/Plan:  Neutropenic fever with Sepsis - Staph aureus bacteremia  - did not require pressors - lactate cleared - possible sources are Rt thigh lesions and right foot - confirmed to be staph on 7/23  - Port-A-Cath removed 7/26  - TTE negative for endocarditis - MRI L spine revealed discitis and psoas muscle inflammation - Neurosurgery called per ID recommendations  - Dr. Mikal Plane called Dr. Butler Denmark stating there is no need for surgery and therefore patient does not need a consult - ID concerned that PICC could be infected, and has therefore asked that it be d/c  - per ID, HE NEEDS MINIMUM OF 8 WEEKS OF IV  CEFAZOLIN with day #1 being day of first negative blood cultures AFTER REMOVAL OF PICC   Back pain - lumbar discitis  - MRI reveals lumbar discitis - per pt, pain has improved significantly since admission, but is greatly exacerbated by movement  - Per ID will need repeat MRI of the spine prior to stopping the IV antibiotics to ensure psoas infection clearing and no progression of diskitis   Illeus - low grade coffee ground emesis - KUB confirmed ileus - suspect pt has suffered mild mallory-weiss tear due to vomiting in setting of coagulopathy, vs/ chemo associated mucositis  - NG tube came out while patient was having MRI performed - currently tolerating regular diet, though appetite is poor   Anemia Hgb dropping slowly but consistently - no obvious source of bleeding noted - anemia panel c/w ongoing infection/"chronic disease" - decrease anticoag to DVT prophy dose for now (pt informed of risk of CVA) - transfuse as needed to keep Hgb >7.0 - recheck CBC in AM - may have to obtain CT of abdom to r/o [facility]  Coagulopathy 2nd to chronic coumadin for A fib INR was markedly elevated at 6.74 in setting of coffee ground emesis - was given Vitamin K 10mg  + 4 U FFP for reversal - resumed anticoag w/ lovenox on 7/28  and coumadin on 8/2- no obvious bleed noted, but with dropping Hgb switching to DVT prophy lovenox only  on 8/4  Left knee swelling - ID asked for an Ortho consult to aspirate the knee - fluid reveals gout crystals - prednisone x 5 days  Severe dependent edema in legs - gave lasix on 8/1 but renal function worsened and therefore did not give further lasix - per wieght, he is about the same as when he was admitted and per I and O, he is in negative balance by 1 L  Hypokalemia - having diarrhea - replace - recheck in AM  Diarrhea - started after diet resumed once ileus resolved - C dif neg - seems to be slowing today   Pustular folliculitis Cont abx coverage - rash has essentially  resolved  Venous stasis dermatitis  Cellulitis of leg, left resolved w/ abx tx  Pancytopenia 2nd to chemotherapy for CLL neupogen x 1 on 7/21 - improvement finally noted on 8/1- consulted oncology - see Dr Alver Fisher note  Chronic Afib Now tolerating PO Cardizem as ileus resolved - lovenox >> coumadin currently on hold as discussed above - pt and wife counseled on risk for CVA  Acute on chronic renal failure - Cr increased to 3.06 on 7/21 - baseline cr 2.8 - crt not checked today - will order BMET for AM  Acute toxic metabolic encephalopathy  -2nd to sepsis - now resolved - folate is normal - B12 low at 276 so replacement ordered    Hx of HTN BP well controlled at this time/borderline hypotensive - follow   Code Status: FULL Family Communication: with wife and pt at bedside  Disposition Plan: SDU - hopeful for LTAC transfer Wed/Thurs if Hgb stable   Consultants: PCCM >> [facility] ID Gen Surgery Orthopedics    Antibiotics: Cefazolin 7/22 >> Primaxin 7/21 >> 7/22 Acyclovir 7/21 >> 7/22 Micafungin 7/21 >> 7/22 Vancomycin 7/21 >> 7/24  DVT prophylaxis: SCDs >>  lovenox  HPI/Subjective: No new complaints today.  Relates severe back pain when working with PT.  No diarrhea thus far today.  Denies cp, sob, n/v, or abdom pain.    Objective: Blood pressure 105/63, pulse 92, temperature 98.1 F (36.7 C), temperature source Oral, resp. rate 14, height 5' 8.5" (1.74 m), weight 156 kg (343 lb 14.7 oz), SpO2 98.00%.  Intake/Output Summary (Last 24 hours) at 08/14/12 1523 Last data filed at 08/14/12 1436  Gross per 24 hour  Intake   1595 ml  Output    550 ml  Net   1045 ml    Exam: General: No acute respiratory distress at rest in bed  Lungs: Clear to auscultation bilaterally without wheezes or crackles - BS are distant Cardiovascular: reg rate w/o appreciable gallup or rub  Abdomen: morbidly obese, nontender, protuberant, soft, bowel sounds slow, no rebound, no ascites, no  appreciable mass Extremities: change of chronic venous stasis B - severe edema in thighs/ legs - charcot joints B ankles  Data Reviewed: Basic Metabolic Panel:  Recent Labs Lab 08/09/12 0930 08/10/12 0540 08/11/12 1420 08/12/12 0500 08/13/12 0500 08/13/12 0930  NA 149* 148* 147* 149* 147*  --   K 3.6 3.4* 3.1* 3.0* 2.9*  --   CL 116* 117* 115* 115* 115*  --   CO2 24 24 22 24 23   --   GLUCOSE 111* 121* 143* 126* 121*  --   BUN 53* 51* 51* 53* 53*  --   CREATININE 2.14* 2.31* 2.54* 2.85* 2.97*  --   CALCIUM 8.1* 7.9* 7.8* 8.0* 7.9*  --   MG  --   --   --   --   --  1.8   Liver Function Tests:  Recent Labs Lab 08/08/12 0411  AST 6  ALT <5  ALKPHOS 49  BILITOT 1.9*  PROT 4.2*  ALBUMIN 1.7*   CBC:  Recent Labs Lab 08/09/12 0930 08/11/12 1420 08/11/12 1700 08/13/12 0930 08/13/12 1435 08/14/12 0200 08/14/12 1000  WBC 0.9* 1.6* 1.7* 3.0* 3.2*  --   --   HGB 7.3* 6.8* 7.6* 6.6* 6.8* 6.5* 7.3*  HCT 23.4* 21.8* 24.1* 21.5* 21.5* 21.1* 23.4*  MCV 95.9 94.8 94.9 95.6 96.4  --   --   PLT 86* 128* 133* 168 173  --   --    CBG:  Recent Labs Lab 08/07/12 1540 08/07/12 2109  GLUCAP 116* 103*    Recent Results (from the past 240 hour(s))  CLOSTRIDIUM DIFFICILE BY PCR     Status: None   Collection Time    08/10/12  8:00 PM      Result Value Range Status   C difficile by pcr NEGATIVE  NEGATIVE Final  GRAM STAIN     Status: None   Collection Time    08/12/12  1:06 PM      Result Value Range Status   Specimen Description SYNOVIAL LEFT KNEE   Final   Special Requests NONE   Final   Gram Stain     Final   Value: ABUNDANT WBC PRESENT, PREDOMINANTLY PMN     NO ORGANISMS SEEN     MUHORO D.,RN 08.02.14 1333 BY JONESJ   Report Status 08/12/2012 FINAL   Final  BODY FLUID CULTURE     Status: None   Collection Time    08/12/12  1:06 PM      Result Value Range Status   Specimen Description SYNOVIAL LEFT KNEE   Final   Special Requests NONE   Final   Gram Stain      Final   Value: ABUNDANT WBC PRESENT, PREDOMINANTLY PMN     NO ORGANISMS SEEN     MUHORO D RN 08.02.14 1333 BY JONESJ Performed at [facility]   Culture NO GROWTH 2 DAYS   Final   Report Status PENDING   Incomplete     Studies:  Recent x-ray studies have been reviewed in detail by the Attending Physician  Scheduled Meds:  Scheduled Meds: . antiseptic oral rinse  15 mL Mouth Rinse q12n4p  .  ceFAZolin (ANCEF) IV  2 g Intravenous Q8H  . chlorhexidine  15 mL Mouth Rinse BID  . diltiazem  180 mg Oral BH-q7a  . enoxaparin (LOVENOX) injection  150 mg Subcutaneous Q12H  . famotidine  20 mg Oral BID  . lactose free nutrition  237 mL Oral TID WC  . predniSONE  40 mg Oral Q breakfast  . sodium chloride  10-40 mL Intracatheter Q12H  . vitamin B-12  1,000 mcg Oral Daily  . warfarin  5 mg Oral ONCE-1800  . Warfarin - Pharmacist Dosing Inpatient   Does not apply q1800    Time spent on care of this patient: 35 mins   [facility] T, MD  [facility] Office  [phone] Pager - Text Page per Amion as per below:  On-Call/Text Page:      [email or web address]      password [email or web address]  If 7PM-7AM, please contact night-coverage [email or web address] Password [email or web address] 08/14/2012, 3:23 PM   LOS: 14 days

## 2012-08-14 NOTE — Progress Notes (Signed)
Physical Therapy Treatment Patient Details Name: Alex Petty MRN: [id number] DOB: 1945/06/02 Today's Date: 08/14/2012 Time: 4098-1191 PT Time Calculation (min): 24 min  PT Assessment / Plan / Recommendation  History of Present Illness 67 yo morbidly obese male admitted 7/21 for septic shock with neutropenia. Patient started on vanc/primaxin/acyclovir/micafungin, but narrowed to current vancomycin and cefazolin for cultures revealing staph aureus. Source likely R thigh vesicular lesions/R foot   PT Comments   Pt con't to make slow progress towards goals. Pt remains greatly limited by back pain. Anticipate pt to require ST-SNF placement prior to transition home due to pt currently requiring maximal assist for all mobility and ADLs   Follow Up Recommendations  SNF;Supervision/Assistance - 24 hour     Does the patient have the potential to tolerate intense rehabilitation     Barriers to Discharge        Equipment Recommendations       Recommendations for Other Services    Frequency Min 3X/week   Progress towards PT Goals Progress towards PT goals: Progressing toward goals  Plan Discharge plan needs to be updated    Precautions / Restrictions Precautions Precautions: Fall Restrictions Weight Bearing Restrictions: No   Pertinent Vitals/Pain 10/10 back pain during transfer but diminishes once position achieved ie supine or sitting EOB    Mobility  Bed Mobility Bed Mobility: Supine to Sit;Sit to Sidelying Left;Scooting to HOB Supine to Sit: 1: +2 Total assist;With rails;HOB elevated Supine to Sit: Patient Percentage: 50% Sit to Sidelying Left: 2: Max assist;HOB flat;With rail Scooting to [facility]: 1: +2 Total assist (pt pulled on head board, bed in trendelenburg) Details for Bed Mobility Assistance: elevated HOB all the way up to limit chance of back spasm. pt with spasm when transitioning to EOB but once there spasm diminished Transfers Transfers: Not assessed (pt unable to  tolerate) Ambulation/Gait Ambulation/Gait Assistance: Not tested (comment)    Exercises General Exercises - Lower Extremity Quad Sets: AROM;Both;5 reps;Supine Gluteal Sets: AROM;Both;5 reps;Supine Long Arc Quad: AROM;5 reps;Both;Seated (AAROM to L LE)   PT Diagnosis:    PT Problem List:   PT Treatment Interventions:     PT Goals (current goals can now be found in the care plan section)    Visit Information  Last PT Received On: 08/14/12 Assistance Needed: +2 History of Present Illness: 67 yo morbidly obese male admitted 7/21 for septic shock with neutropenia. Patient started on vanc/primaxin/acyclovir/micafungin, but narrowed to current vancomycin and cefazolin for cultures revealing staph aureus. Source likely R thigh vesicular lesions/R foot    Subjective Data  Subjective: Pt received supine in bed agreeable to PT   Cognition  Cognition Arousal/Alertness: Awake/alert Behavior During Therapy: WFL for tasks assessed/performed Overall Cognitive Status: Within Functional Limits for tasks assessed    Balance  Static Sitting Balance Static Sitting - Balance Support: Bilateral upper extremity supported;Feet supported Static Sitting - Level of Assistance: 5: Stand by assistance (pt leaned against HOB and used rail) Static Sitting - Comment/# of Minutes: pt tolerated 6 min  End of Session PT - End of Session Equipment Utilized During Treatment: Oxygen Activity Tolerance: Patient limited by pain Patient left: in bed;with call bell/phone within reach;with nursing/sitter in room Nurse Communication: Mobility status;Patient requests pain meds   GP     Marcene Brawn 08/14/2012, 9:07 AM  Lewis Shock, PT, DPT Pager #: [phone] Office #: [phone]

## 2012-08-14 NOTE — Progress Notes (Signed)
Patient ID: Alex Petty, male   DOB: Sep 22, 1945, 67 y.o.   MRN: [id number]   Review of left knee aspirate reveals 26,000 white cells with intracellular crystals consistent with known diagnosis of gout  Will defer to medical management based on general medical co-morbidities  Call with further questions

## 2012-08-15 ENCOUNTER — Inpatient Hospital Stay (HOSPITAL_COMMUNITY): Payer: Medicare Other

## 2012-08-15 ENCOUNTER — Ambulatory Visit: Payer: Medicare Other | Admitting: Cardiovascular Disease

## 2012-08-15 ENCOUNTER — Encounter (HOSPITAL_COMMUNITY): Payer: Self-pay | Admitting: Radiology

## 2012-08-15 DIAGNOSIS — R609 Edema, unspecified: Secondary | ICD-10-CM

## 2012-08-15 LAB — CBC
HCT: 23.4 % — ABNORMAL LOW (ref 39.0–52.0)
Hemoglobin: 7.3 g/dL — ABNORMAL LOW (ref 13.0–17.0)
MCV: 95.5 fL (ref 78.0–100.0)
RBC: 2.45 MIL/uL — ABNORMAL LOW (ref 4.22–5.81)
RDW: 18.4 % — ABNORMAL HIGH (ref 11.5–15.5)
WBC: 3.2 10*3/uL — ABNORMAL LOW (ref 4.0–10.5)

## 2012-08-15 LAB — BODY FLUID CULTURE: Culture: NO GROWTH

## 2012-08-15 LAB — TYPE AND SCREEN
ABO/RH(D): A POS
Antibody Screen: NEGATIVE
Unit division: 0
Unit division: 0

## 2012-08-15 LAB — BASIC METABOLIC PANEL
Calcium: 8.1 mg/dL — ABNORMAL LOW (ref 8.4–10.5)
GFR calc non Af Amer: 18 mL/min — ABNORMAL LOW (ref >90)
Glucose, Bld: 133 mg/dL — ABNORMAL HIGH (ref 70–99)
Sodium: 144 mEq/L (ref 135–145)

## 2012-08-15 NOTE — Progress Notes (Signed)
[facility]  Day 16 cefazolin    Subjective: Knee pain is better "   Antibiotics:  Anti-infectives   Start     Dose/Rate Route Frequency Ordered Stop   08/02/12 1400  vancomycin (VANCOCIN) 2,000 mg in sodium chloride 0.9 % 500 mL IVPB  Status:  Discontinued     2,000 mg 250 mL/hr over 120 Minutes Intravenous Every 48 hours 07/31/12 2041 08/01/12 1124   08/01/12 1800  vancomycin (VANCOCIN) 2,000 mg in sodium chloride 0.9 % 500 mL IVPB  Status:  Discontinued     2,000 mg 250 mL/hr over 120 Minutes Intravenous Every 24 hours 08/01/12 1124 08/03/12 1433   08/01/12 1730  ceFAZolin (ANCEF) IVPB 2 g/50 mL premix     2 g 100 mL/hr over 30 Minutes Intravenous 3 times per day 08/01/12 1621     08/01/12 1200  acyclovir (ZOVIRAX) 750 mg in dextrose 5 % 150 mL IVPB  Status:  Discontinued     750 mg 165 mL/hr over 60 Minutes Intravenous Every 12 hours 08/01/12 1124 08/01/12 1544   08/01/12 0000  imipenem-cilastatin (PRIMAXIN) 250 mg in sodium chloride 0.9 % 100 mL IVPB  Status:  Discontinued     250 mg 200 mL/hr over 30 Minutes Intravenous 4 times per day 07/31/12 2041 08/01/12 1544   07/31/12 2115  vancomycin (VANCOCIN) IVPB 1000 mg/200 mL premix     1,000 mg 200 mL/hr over 60 Minutes Intravenous NOW 07/31/12 2041 07/31/12 2215   07/31/12 2030  acyclovir (ZOVIRAX) 1,000 mg in dextrose 5 % 150 mL IVPB  Status:  Discontinued     1,000 mg 170 mL/hr over 60 Minutes Intravenous Every 24 hours 07/31/12 1921 08/01/12 1124   07/31/12 2000  micafungin (MYCAMINE) 150 mg in sodium chloride 0.9 % 100 mL IVPB  Status:  Discontinued     150 mg 100 mL/hr over 1 Hours Intravenous Daily 07/31/12 1921 08/01/12 1544   07/31/12 1315  imipenem-cilastatin (PRIMAXIN) 500 mg in sodium chloride 0.9 % 100 mL IVPB     500 mg 200 mL/hr over 30 Minutes Intravenous  Once 07/31/12 1306 07/31/12 1627   07/31/12 1300  vancomycin (VANCOCIN) IVPB 1000 mg/200 mL premix     1,000 mg 200 mL/hr  over 60 Minutes Intravenous  Once 07/31/12 1252 07/31/12 1627      Medications: Scheduled Meds: . antiseptic oral rinse  15 mL Mouth Rinse q12n4p  .  ceFAZolin (ANCEF) IV  2 g Intravenous Q8H  . chlorhexidine  15 mL Mouth Rinse BID  . diltiazem  180 mg Oral BH-q7a  . enoxaparin (LOVENOX) injection  40 mg Subcutaneous Q24H  . famotidine  20 mg Oral BID  . lactose free nutrition  237 mL Oral TID WC  . predniSONE  40 mg Oral Q breakfast  . sodium chloride  10-40 mL Intracatheter Q12H  . vitamin B-12  1,000 mcg Oral Daily   Continuous Infusions:   PRN Meds:.acetaminophen, heparin lock flush, morphine injection, ondansetron, promethazine, sodium chloride   Objective: Weight change: -14.1 oz (-0.4 kg)  Intake/Output Summary (Last 24 hours) at 08/15/12 1657 Last data filed at 08/15/12 1529  Gross per 24 hour  Intake    990 ml  Output    800 ml  Net    190 ml   Blood pressure 122/65, pulse 90, temperature 98.2 F (36.8 C), temperature source Oral, resp. rate 20, height 5' 8.5" (1.74 m), weight 343 lb 0.6 oz (155.6 kg),  SpO2 99.00%. Temp:  [97.6 F (36.4 C)-98.8 F (37.1 C)] 98.2 F (36.8 C) (08/05 1521) Pulse Rate:  [69-107] 90 (08/05 1521) Resp:  [15-31] 20 (08/05 1521) BP: (99-130)/(52-82) 122/65 mmHg (08/05 1521) SpO2:  [97 %-100 %] 99 % (08/05 1521) Weight:  [343 lb 0.6 oz (155.6 kg)] 343 lb 0.6 oz (155.6 kg) (08/05 0300)  Physical Exam: General: Alert and awake, oriented x3, wearing CPAP HEENT: anicteric sclera, pupils reactive to light and accommodation, EOMI CVS regular rate, normal r,  no murmur rubs or gallops Chest: clear to auscultation bilaterally, no wheezing, rales or rhonchi Abdomen: soft nontender, nondistended, normal bowel sounds, Extremities: Left knee is  tender to palpation He also has pain radiating down his back into his lower thigh consistent with his known discitis.  Skin: erythema surrounding left chronic skin changes, also changes (chronic left  side) Neuro: nonfocal  Lab Results:  Recent Labs  08/13/12 1435  08/14/12 1000 08/15/12 0945  WBC 3.2*  --   --  3.2*  HGB 6.8*  < > 7.3* 7.3*  HCT 21.5*  < > 23.4* 23.4*  PLT 173  --   --  204  < > = values in this interval not displayed.  BMET  Recent Labs  08/13/12 0500 08/15/12 0945  NA 147* 144  K 2.9* 4.1  CL 115* 111  CO2 23 22  GLUCOSE 121* 133*  BUN 53* 62*  CREATININE 2.97* 3.30*  CALCIUM 7.9* 8.1*    Micro Results: Recent Results (from the past 240 hour(s))  CLOSTRIDIUM DIFFICILE BY PCR     Status: None   Collection Time    08/10/12  8:00 PM      Result Value Range Status   C difficile by pcr NEGATIVE  NEGATIVE Final  GRAM STAIN     Status: None   Collection Time    08/12/12  1:06 PM      Result Value Range Status   Specimen Description SYNOVIAL LEFT KNEE   Final   Special Requests NONE   Final   Gram Stain     Final   Value: ABUNDANT WBC PRESENT, PREDOMINANTLY PMN     NO ORGANISMS SEEN     MUHORO D.,RN 08.02.14 1333 BY JONESJ   Report Status 08/12/2012 FINAL   Final  BODY FLUID CULTURE     Status: None   Collection Time    08/12/12  1:06 PM      Result Value Range Status   Specimen Description SYNOVIAL LEFT KNEE   Final   Special Requests NONE   Final   Gram Stain     Final   Value: ABUNDANT WBC PRESENT, PREDOMINANTLY PMN     NO ORGANISMS SEEN     MUHORO D RN 08.02.14 1333 BY JONESJ Performed at [facility]     Performed at [facility]   Culture     Final   Value: NO GROWTH 3 DAYS     Performed at [facility]   Report Status 08/15/2012 FINAL   Final    Studies/Results: Ct Abdomen Pelvis Wo Contrast  08/15/2012   *RADIOLOGY REPORT*  Clinical Data: Drop in hemoglobin.  On blood thinner.  Chronic renal disease. Atrial fibrillation.  Hypertension.  Chronic lymphocytic leukemia undergoing chemotherapy.  Bacteremia. Disc/facet infection.  Psoas muscle infection.  CT ABDOMEN AND PELVIS WITHOUT CONTRAST  Technique:   Multidetector CT imaging of the abdomen and pelvis was performed following the standard protocol without intravenous  contrast.  Comparison: 08/10/2012 lumbar spine MR.  06/13/2012 PET CT.  Findings: Destruction L3 vertebral body consistent with the patient's history of diskitis.  There is extension of inflammatory process into the left psoas muscle on the prior MR.  There remains mild asymmetry of the psoas muscles but without large collection to suggest retroperitoneal hemorrhage as cause of the patient's drop in hematocrit.  Interval decrease in size of previously noted (PET CT of the 06/13/2012) adenopathy.  Residual lymph nodes remain with surrounding hazy inflammation suggesting result of treatment. Largest remaining lymph nodes noted in the left inguinal region measures 2.6 x 1.5 cm maximal dimension.  Spleen remains enlarged spanning over 15.6 cm.  Diffuse third spacing of fluid including small pleural effusions greater on the right and prominent subcutaneous edema/fluid collection greater on the left.  It would be difficult to exclude subcutaneous/flank blood given the history although this would be unusual and would be apparent on this coronal examination.  Prior sigmoid resection.  Prominent diverticula throughout the colon most prominent descending colon and the sigmoid colon. Limited for detection of inflammation given the third spacing of fluid. No free air.  Fluid and gas filled slightly prominent size loops of small bowel without point of obstruction noted.  Radiopaque containing normal sized appendix.  This may represent the presence of appendicoliths or barium.  Mild circumferential thickening of the cecum and proximal ascending colon may be related to under distension although limiting detection of colitis.  Tiny amount of gas in the bladder may be related to recent catheterization.  Clinical correlation recommended.  Small gallstones with dense bile.  Slightly lobulated appearance of the liver.   Cirrhosis not excluded.  Evaluation of solid abdominal viscera is limited by lack of IV contrast.  Taking this limitation into account no obvious hepatic, splenic, pancreatic, adrenal or renal lesion.  Atherosclerotic type changes of the aorta and iliac arteries. Dilated iliac arteries measuring up to 2.2 cm bilaterally.  IMPRESSION: The source of decreased hematocrit is not identified with certainty.  Please see above discussion.  Decrease in size although incomplete resolution of adenopathy. Residual splenomegaly.  Diffuse third spacing of fluid most notable left flank subcutaneous region.  Evaluation of bowel is limited by the third spacing of fluid please see above discussion.  Calcified gallstones.  L3-4 destruction consistent with the patient's history of diskitis.  Dilated iliac arteries measuring up to 2.2 cm bilaterally.   Original Report Authenticated By: Lacy Duverney, M.D.      Assessment/Plan: Alex Petty is a 67 y.o. male with  CLL undergoing chemotherapy with pancytopenia and now MSSA bacteremia and L3-S1 disk, facet infection, psoas muscle infection  #1 MSSA bacteremia with Lumbar diskitis and psoas muscle abscess and now rule out metastatic infeciton to knee. Knee spreader was more consistent with gout with crystals and the cell count. No organisms have been grown from the knee and the knee is getting better  --PICC is out  --BLOOD CULTURE POST REMOVAL OF PICC are cooking from today  --CATHETER HOLIDAY WHILE BLOOD CLEARING ( i WOULD NOT PLACE NEW PICC UNTIL PT HAS BEEN WITHOUT CENTRAL LINE FOR AT LEAST 72 HOURS)  --HE NEEDS MINIMUM OF 8 WEEKS OF IV CEFAZOLIN with day #1 TODAY (if CULTURES REMAIN NEGATIVE POST PICC REMOVAL)  --He will need repeat MRI of the spine prior to stopping the IV antibiotics to ensure psoas infection clearing and no progression of diskitis (keeping in mind MRI can look worse despite clinical improvement)  --He  will need weekly cbc and bmp faxed to Dr.  Daiva Eves # [phone]  We would like to see him  next 2 weeks whether it be in [facility] or if he goes to SNF in our clinic and prior to end of his abx course tentative stop date is September 16th  #2 Swollen Knee appears to be gout  I will sign off for now please call with further questions.     LOS: 15 days   Acey Lav 08/15/2012, 4:57 PM

## 2012-08-15 NOTE — Progress Notes (Signed)
[facility] Progress Note [place] TEAM 1 - Stepdown/ICU TEAM   Alex Petty ION:[id number] DOB: [birth year] DOA: 07/31/2012 PCP: [name], MD  Brief narrative: 67 yo male started on chemo for CLL (bendamustine/rituxan). He developed rash on right thigh originally thought to be zoster. He was started on zovirax as outpt. He developed progressive weakness to the point he had trouble walking. He was becoming more confused. He had fever to 103F. Family took him to [facility] ED. He was found to be hypotensive, febrile, anemic, and neutropenic. He had CVL placed, was given IV fluid, transfused 1 unit PRBC, and started on ABx. He was transfered to [facility] for further assessment.   While at [facility], the pt has been diagnosed w/ Staph bacteremia.  As a result, his Port-A-Cath has been removed.  He has also required tx for an ileus.    Hospital summary:   7/21 - Transfer to [facility] on PCCM service, PRBC transfusion  7/23 - [facility] assumed care of pt  7/25 - pt developed hematemesis - INR found to be >6.0 7/25 - 7/26 - transfused FFP, PRBC, and Vitamin K 10mg  7/26 - to OR for Port-A-Cath removal 7/27 - 7/29 - had NG tube for ileus 7/30 - MRI reveals lumbar discitis and infection and left psoas inflammation  8/1 - 8/2 - developed left knee swelling >> Ortho consult >> L knee aspirate c/w gout   Assessment/Plan:  Neutropenic fever with Sepsis - Staph aureus bacteremia  - did not require pressors - lactate cleared - possible sources are Rt thigh lesions and right foot - confirmed to be staph on 7/23  - Port-A-Cath removed 7/26  - TTE negative for endocarditis - MRI L spine revealed discitis and psoas muscle inflammation - Neurosurgery called per ID recommendations  - Dr. Mikal Plane called Dr. Butler Denmark stating there is no need for surgery and therefore patient does not need a consult - ID concerned that PICC could be infected, and has therefore asked that it be d/c  - per ID, HE NEEDS MINIMUM OF 8 WEEKS OF IV  CEFAZOLIN with day #1 being day of first negative blood cultures AFTER REMOVAL OF PICC   Back pain - lumbar discitis  - MRI reveals lumbar discitis - per pt, pain has improved significantly since admission, but is greatly exacerbated by movement  - Per ID will need repeat MRI of the spine prior to stopping the IV antibiotics to ensure psoas infection clearing and no progression of diskitis   Illeus - low grade coffee ground emesis - KUB confirmed ileus - suspect pt has suffered mild mallory-weiss tear due to vomiting in setting of coagulopathy, vs/ chemo associated mucositis  - NG tube came out while patient was having MRI performed - currently tolerating regular diet, though appetite is poor   Anemia Hgb dropping slowly but consistently - no obvious source of bleeding noted - anemia panel c/w ongoing infection/"chronic disease" - decrease anticoag to DVT prophy dose for now (pt informed of risk of CVA) - transfuse as needed to keep Hgb >7.0 - CBC reveals Hgb only rose by 1 point after 2 U of blood- CT abd/pelvis done without contrast does not reveal retroperitoneal bleed  Severe anasarca - Hesitant to diurese due to steadily worsening renal function - gave lasix on 8/1 but renal function worsened and therefore did not give further lasix  Coagulopathy 2nd to chronic coumadin for A fib INR was markedly elevated at 6.74 in setting of coffee ground emesis -  was given Vitamin K 10mg  + 4 U FFP for reversal - resumed anticoag w/ lovenox on 7/28  and coumadin on 8/2- no obvious bleed noted, but with dropping Hgb switching to DVT prophy lovenox only on 8/4  Left knee swelling - ID asked for an Ortho consult to aspirate the knee - fluid reveals gout crystals - prednisone x 5 days  Hypokalemia - due to diarrhea- resolved  Diarrhea - started after diet resumed once ileus resolved - C dif neg - seems to be slowing today   Pustular folliculitis Cont abx coverage - rash has essentially  resolved  Venous stasis dermatitis  Cellulitis of leg, left resolved w/ abx tx  Pancytopenia 2nd to chemotherapy for CLL neupogen x 1 on 7/21 - improvement finally noted on 8/1- consulted oncology - see Dr Alver Fisher note  Chronic Afib Now tolerating PO Cardizem as ileus resolved - lovenox >> coumadin currently on hold as discussed above - pt and wife counseled on risk for CVA  Acute on chronic renal failure - Cr increased to 3.06 on 7/21 - baseline cr 2.8 - now up to 3.30 -  requested renal consult  Acute toxic metabolic encephalopathy  -2nd to sepsis - now resolved - folate is normal - B12 low at 276 so replacement ordered    Hx of HTN BP well controlled at this time/borderline hypotensive - follow   Code Status: FULL Family Communication: with wife and pt at bedside  Disposition Plan: SDU - hopeful for LTAC transfer Wed/Thurs if Hgb stable   Consultants: PCCM >> [facility] ID Gen Surgery Orthopedics    Antibiotics: Cefazolin 7/22 >> Primaxin 7/21 >> 7/22 Acyclovir 7/21 >> 7/22 Micafungin 7/21 >> 7/22 Vancomycin 7/21 >> 7/24  DVT prophylaxis: SCDs >>  lovenox  HPI/Subjective: No new complaints-   Objective: Blood pressure 122/65, pulse 90, temperature 98.2 F (36.8 C), temperature source Oral, resp. rate 20, height 5' 8.5" (1.74 m), weight 155.6 kg (343 lb 0.6 oz), SpO2 99.00%.  Intake/Output Summary (Last 24 hours) at 08/15/12 1621 Last data filed at 08/15/12 1529  Gross per 24 hour  Intake    990 ml  Output    800 ml  Net    190 ml    Exam: General: No acute respiratory distress at rest in bed  Lungs: Clear to auscultation bilaterally without wheezes or crackles - BS are distant Cardiovascular: reg rate w/o appreciable gallup or rub  Abdomen: morbidly obese, nontender, protuberant, soft, bowel sounds slow, no rebound, no ascites, no appreciable mass- pitting edema in flanks Extremities: change of chronic venous stasis B - severe edema in thighs/ legs - charcot  joints B ankles  Data Reviewed: Basic Metabolic Panel:  Recent Labs Lab 08/10/12 0540 08/11/12 1420 08/12/12 0500 08/13/12 0500 08/13/12 0930 08/15/12 0945  NA 148* 147* 149* 147*  --  144  K 3.4* 3.1* 3.0* 2.9*  --  4.1  CL 117* 115* 115* 115*  --  111  CO2 24 22 24 23   --  22  GLUCOSE 121* 143* 126* 121*  --  133*  BUN 51* 51* 53* 53*  --  62*  CREATININE 2.31* 2.54* 2.85* 2.97*  --  3.30*  CALCIUM 7.9* 7.8* 8.0* 7.9*  --  8.1*  MG  --   --   --   --  1.8  --    Liver Function Tests: No results found for this basename: AST, ALT, ALKPHOS, BILITOT, PROT, ALBUMIN,  in the last 168 hours  CBC:  Recent Labs Lab 08/11/12 1420 08/11/12 1700 08/13/12 0930 08/13/12 1435 08/14/12 0200 08/14/12 1000 08/15/12 0945  WBC 1.6* 1.7* 3.0* 3.2*  --   --  3.2*  HGB 6.8* 7.6* 6.6* 6.8* 6.5* 7.3* 7.3*  HCT 21.8* 24.1* 21.5* 21.5* 21.1* 23.4* 23.4*  MCV 94.8 94.9 95.6 96.4  --   --  95.5  PLT 128* 133* 168 173  --   --  204   CBG: No results found for this basename: GLUCAP,  in the last 168 hours  Recent Results (from the past 240 hour(s))  CLOSTRIDIUM DIFFICILE BY PCR     Status: None   Collection Time    08/10/12  8:00 PM      Result Value Range Status   C difficile by pcr NEGATIVE  NEGATIVE Final  GRAM STAIN     Status: None   Collection Time    08/12/12  1:06 PM      Result Value Range Status   Specimen Description SYNOVIAL LEFT KNEE   Final   Special Requests NONE   Final   Gram Stain     Final   Value: ABUNDANT WBC PRESENT, PREDOMINANTLY PMN     NO ORGANISMS SEEN     MUHORO D.,RN 08.02.14 1333 BY JONESJ   Report Status 08/12/2012 FINAL   Final  BODY FLUID CULTURE     Status: None   Collection Time    08/12/12  1:06 PM      Result Value Range Status   Specimen Description SYNOVIAL LEFT KNEE   Final   Special Requests NONE   Final   Gram Stain     Final   Value: ABUNDANT WBC PRESENT, PREDOMINANTLY PMN     NO ORGANISMS SEEN     MUHORO D RN 08.02.14 1333 BY JONESJ  Performed at [facility]     Performed at [facility]   Culture     Final   Value: NO GROWTH 3 DAYS     Performed at [facility]   Report Status 08/15/2012 FINAL   Final     Studies:  Recent x-ray studies have been reviewed in detail by the Attending Physician  Scheduled Meds:  Scheduled Meds: . antiseptic oral rinse  15 mL Mouth Rinse q12n4p  .  ceFAZolin (ANCEF) IV  2 g Intravenous Q8H  . chlorhexidine  15 mL Mouth Rinse BID  . diltiazem  180 mg Oral BH-q7a  . enoxaparin (LOVENOX) injection  40 mg Subcutaneous Q24H  . famotidine  20 mg Oral BID  . lactose free nutrition  237 mL Oral TID WC  . predniSONE  40 mg Oral Q breakfast  . sodium chloride  10-40 mL Intracatheter Q12H  . vitamin B-12  1,000 mcg Oral Daily    Time spent on care of this patient: 35 mins   Calvert Cantor, MD  [facility] Office  [phone] Pager - Text Page per Loretha Stapler as per below:  On-Call/Text Page:      [email or web address]      password [email or web address]  If 7PM-7AM, please contact night-coverage [email or web address] Password [email or web address] 08/15/2012, 4:21 PM   LOS: 15 days

## 2012-08-15 NOTE — Consult Note (Signed)
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  Now on at [place] 8wk course of cefazolin.  TTE negative for IE.    Pt with baseline CKD and SCr 2.2-2.4 at time of CTX.  By report this is related to chronic NSAID use.  No hx/o DM.  No hx/o proteinuria.  SCr first above baseline function 8/1 and has increased to 3.3 today.   No AMGs, iodinated contrast, NSAIDs, TMP/SMX, ARB/ACEi, sustained hypotension.  Trial of furosemide 40 IV BID 08/01.  UOP ranging from 350 - 1700 over past several days and net even most days.    Pt with chronic massive LEE, and venous stasis.  Somwehat worsened here in hospital.  No new rashes, other joing pains.  No N/V, SOB, CP.  No chagne to urine color.  No urinary hesitancy, dribbling. Incontinence, lwer abd pain.     Creatinine, Ser (mg/dL)  Date Value  01/16/1094 3.30*  08/13/2012 2.97*  08/12/2012 2.85*  08/11/2012 2.54*  08/10/2012 2.31*  08/09/2012 2.14*  08/08/2012 2.04*  08/06/2012 1.72*  08/05/2012 1.78*  08/03/2012 1.93*  ] ROS Balance of 12 systems is negative w/ exceptions as above  PMH  Past Medical History  Diagnosis Date  . Arthritis   . Hypertension   . DJD (degenerative joint disease)   . DJD (degenerative joint disease), ankle and foot   . Arthritis   . Cellulitis   . Arthritis   . Chronic back pain   . Gout   . Dysrhythmia     afibrillation  . Atrial fibrillation   . Chronic kidney disease   . Colon cancer     2005  . 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 Charcot ankle     Alex Petty   [facility]  Past Surgical History  Procedure Laterality Date  . Foot surgery      right foot-  . Colon surgery      forcolon cancer, 2005, Dr.  Marcell Anger in [place]  . Tonsillectomy    . Cystoscopy with urethral dilatation    . Back surgery      x2  . Cataract extraction w/phaco  01/14/2011    Procedure: CATARACT EXTRACTION PHACO AND INTRAOCULAR LENS PLACEMENT (IOC);  Surgeon: Alex Petty;  Location: [facility];  Service: Ophthalmology;  Laterality: Left;  CDE=16.27  . Lymph node biopsy Right 07/11/2012    Procedure: CERVICAL LYMPH NODE BIOPSY;  Surgeon: Alex Hatcher, MD;  Location: [facility];  Service: General;  Laterality: Right;  end @ 1101  . Portacath placement Right 07/11/2012    Procedure: INSERTION PORT-A-CATH;  Surgeon: Alex Hatcher, MD;  Location: [facility];  Service: General;  Laterality: Right;  . Port-a-cath removal Right 08/05/2012    Procedure: REMOVAL PORT-A-CATH;  Surgeon: Alex Rubenstein, MD;  Location: [facility] OR;  Service: General;  Laterality: Right;   FH  Family History  Problem Relation Age of Onset  . Anesthesia problems Neg Hx   . Hypotension Neg Hx   . Malignant hyperthermia Neg Hx   . Pseudochol deficiency Neg Hx   . Breast cancer Mother   . Heart disease Mother    SH  reports that he has never smoked. He  has never used smokeless tobacco. He reports that he does not drink alcohol or use illicit drugs.  Worked as a Curator.   Allergies  Allergies  Allergen Reactions  . Ace Inhibitors Other (See Comments)    cough  . Rocephin (Ceftriaxone Sodium) Hives  . Beta Adrenergic Blockers Rash   Home medications Prior to Admission medications   Medication Sig Start Date End Date Taking? Authorizing Provider  acetaminophen (TYLENOL) 325 MG tablet Take by mouth. Take 2 (325mg  tabs) 1 hour prior to Rituxan treatment.   Yes Historical Provider, MD  acyclovir (ZOVIRAX) 400 MG tablet Take 1 tablet (400 mg total) by mouth 2 (two) times daily. 07/19/12  Yes Ellouise Newer, PA-C  allopurinol (ZYLOPRIM) 300 MG tablet Take 1 tablet (300 mg total) by mouth daily. 07/19/12  Yes Thomas S Kefalas, PA-C  BOSWELLIA SERRATA PO Take  1 capsule by mouth daily. With Vitamin D3   Yes Historical Provider, MD  cyclobenzaprine (FLEXERIL) 10 MG tablet Take 1 tablet (10 mg total) by mouth 3 (three) times daily as needed for muscle spasms. 07/28/12  Yes Maurine Minister Kefalas, PA-C  dexamethasone (DECADRON) 4 MG tablet Starting the day after chemo, take 2 tabs in the am and 2 tabs in the pm for 2 days. Then Stop. Take with food. 07/19/12  Yes Maurine Minister Kefalas, PA-C  diltiazem (CARDIZEM CD) 120 MG 24 hr capsule Take 120 mg by mouth every morning.    Yes Historical Provider, MD  diphenhydrAMINE (BENADRYL) 25 MG tablet Take 50 mg by mouth. Take 2 (25mg ) tablets 1 hour prior to Rituxan treatment.   Yes Historical Provider, MD  doxazosin (CARDURA) 4 MG tablet Take 4 mg by mouth every morning.    Yes Historical Provider, MD  febuxostat (ULORIC) 40 MG tablet Take 40 mg by mouth every morning.    Yes Historical Provider, MD  fenofibrate 54 MG tablet Take 54 mg by mouth every morning.    Yes Historical Provider, MD  Glucosamine-Chondroit-Vit C-Mn (GLUCOSAMINE 1500 COMPLEX PO) Take 1 tablet by mouth every morning.    Yes Historical Provider, MD  HYDROcodone-acetaminophen (NORCO) 7.5-325 MG per tablet Take 1 tablet by mouth every 6 (six) hours as needed for pain.   Yes Historical Provider, MD  HYDROmorphone (DILAUDID) 4 MG tablet Take 1 tablet (4 mg total) by mouth every 6 (six) hours as needed for pain. 07/30/12  Yes Alex Lennert, MD  indomethacin (INDOCIN) 50 MG capsule Take 50 mg by mouth every 8 (eight) hours as needed. For gout   Yes Historical Provider, MD  lidocaine-prilocaine (EMLA) cream Apply a quarter size amount to port site 1 hour prior to chemo. Do not rub in. Cover with plastic. 07/19/12  Yes Ellouise Newer, PA-C  Multiple Vitamins-Minerals (MULTIVITAMINS THER. W/MINERALS) TABS Take 1 tablet by mouth every morning.    Yes Historical Provider, MD  ondansetron (ZOFRAN) 8 MG tablet Starting the day after chemo, take 1 tab in the am and 1 tab in  the pm for 2 days. Then may take 1 tab two times a day IF needed for nausea/vomiting. 07/19/12  Yes Ellouise Newer, PA-C  sulfamethoxazole-trimethoprim (BACTRIM DS,SEPTRA DS) 800-160 MG per tablet Take 1 tablet by mouth every Monday, Wednesday, and Friday. 07/19/12  Yes Maurine Minister Kefalas, PA-C  torsemide (DEMADEX) 20 MG tablet Take 20-40 mg by mouth daily. Take 2 tablets if fluid retention is severe    Yes Historical Provider, MD  vitamin C (ASCORBIC ACID) 500  MG tablet Take 500 mg by mouth daily.   Yes Historical Provider, MD  warfarin (COUMADIN) 5 MG tablet Take 1 tablet (5 mg total) by mouth daily. 07/19/12  Yes Jodelle Gross, NP    Current Medications Current Facility-Administered Medications  Medication Dose Route Frequency Provider Last Rate Last Dose  . acetaminophen (TYLENOL) suppository 650 mg  650 mg Rectal Q4H PRN Lonia Blood, MD      . antiseptic oral rinse (BIOTENE) solution 15 mL  15 mL Mouth Rinse q12n4p Coralyn Helling, MD   15 mL at 08/15/12 1243  . ceFAZolin (ANCEF) IVPB 2 g/50 mL premix  2 g Intravenous Q8H Arman Filter, RPH   2 g at 08/15/12 1450  . chlorhexidine (PERIDEX) 0.12 % solution 15 mL  15 mL Mouth Rinse BID Coralyn Helling, MD   15 mL at 08/15/12 0754  . diltiazem (CARDIZEM CD) 24 hr capsule 180 mg  180 mg Oral Angelina Sheriff, MD   180 mg at 08/15/12 0754  . enoxaparin (LOVENOX) injection 40 mg  40 mg Subcutaneous Q24H Lonia Blood, MD   40 mg at 08/15/12 0755  . famotidine (PEPCID) tablet 20 mg  20 mg Oral BID Calvert Cantor, MD   20 mg at 08/15/12 1020  . heparin lock flush 100 unit/mL  250 Units Intracatheter PRN Ellouise Newer, PA-C      . lactose free nutrition (BOOST PLUS) liquid 237 mL  237 mL Oral TID WC Calvert Cantor, MD   237 mL at 08/15/12 1243  . morphine 2 MG/ML injection 2-4 mg  2-4 mg Intravenous Q2H PRN Lonia Blood, MD   2 mg at 08/14/12 0853  . ondansetron (ZOFRAN) injection 4 mg  4 mg Intravenous Q4H PRN Lonia Blood, MD   4  mg at 08/06/12 1542  . predniSONE (DELTASONE) tablet 40 mg  40 mg Oral Q breakfast Calvert Cantor, MD   40 mg at 08/15/12 0756  . promethazine (PHENERGAN) injection 12.5 mg  12.5 mg Intravenous Q6H PRN Lonia Blood, MD   12.5 mg at 08/06/12 1414  . sodium chloride 0.9 % injection 10-40 mL  10-40 mL Intracatheter PRN Lonia Blood, MD   10 mL at 08/11/12 2237  . sodium chloride 0.9 % injection 10-40 mL  10-40 mL Intracatheter Q12H Lonia Blood, MD   10 mL at 08/14/12 0945  . vitamin B-12 (CYANOCOBALAMIN) tablet 1,000 mcg  1,000 mcg Oral Daily Calvert Cantor, MD   1,000 mcg at 08/15/12 1020    Recent Labs Lab 08/13/12 0930 08/13/12 1435 08/14/12 0200 08/14/12 1000 08/15/12 0945  WBC 3.0* 3.2*  --   --  3.2*  HGB 6.6* 6.8* 6.5* 7.3* 7.3*  HCT 21.5* 21.5* 21.1* 23.4* 23.4*  MCV 95.6 96.4  --   --  95.5  PLT 168 173  --   --  204   UA 8/3: negative protein, moderate Hb, no RBC/HPF.   Basic Metabolic Panel  Recent Labs Lab 08/09/12 0930 08/10/12 0540 08/11/12 1420 08/12/12 0500 08/13/12 0500 08/15/12 0945  NA 149* 148* 147* 149* 147* 144  K 3.6 3.4* 3.1* 3.0* 2.9* 4.1  CL 116* 117* 115* 115* 115* 111  CO2 24 24 22 24 23 22   GLUCOSE 111* 121* 143* 126* 121* 133*  BUN 53* 51* 51* 53* 53* 62*  CREATININE 2.14* 2.31* 2.54* 2.85* 2.97* 3.30*  CALCIUM 8.1* 7.9* 7.8* 8.0* 7.9* 8.1*   CT 8/5 reviewed.  NO evidence of obstruction, hemorrhage.    Physical Exam  Blood pressure 99/52, pulse 85, temperature 98 F (36.7 C), temperature source Oral, resp. rate 20, height 5' 8.5" (1.74 m), weight 155.6 kg (343 lb 0.6 oz), SpO2 100.00%. GEN: mildly ill appearing, obese,  ENT: NCAT. Full dentures EYES: EOMI.  Anicteric sclera CV: RRR. Nl s1s2.  No rub PULM: CTAB. diminshed bs. Nl wob ABD: obese, s/nt SKIN: scattered ecchymoses over palms of hands, pt unclear when first present WUJ:[id number] 4+ pitting edema in feet, legs b/l.  Venous hyperpigmentation and scaling.    Filed  Weights   08/13/12 0409 08/13/12 2326 08/15/12 0300  Weight: 151 kg (332 lb 14.3 oz) 156 kg (343 lb 14.7 oz) 155.6 kg (343 lb 0.6 oz)  Weight 7/23 148kg  A/P 66M with AoCKD in setting of MSSA bacteremia/diskits, septic shock, and recent dx of CLL s/p Rituxan/bendamustine chemotherapy.  Etiology of renal failure unclear.  Baseline is 2.2-2.4 as best can be discerned.  No proteinuria or hematuira on last UA but some Hb and CK should be checked.  S. Aureus can cause a post-infectious process but would expect protein/blood on UA.  Similarly allergic interstitial nephritis on differential but once again UA without pyuria or proteinuria.  Weight is up from admission, blood pressure is normal, and pt ad lib PO without much N/V making hypovolemia unlikely. I am reticent to fluid challenge given masive edema.  No evidence of obsruction currently.  ATN of course is a possibilty in this man with CKD and very severe illness.  The rash/bruising on the palms is unusual but no clearly petechial or purpuric, ? Related to coagulopathy; no TCP.    Pt without uremic symptoms.  Anemia is likely multifactorial and support with ESA therapy is deferred to oncology given malignancy.    IMPRESSION/PLAN 1. AKI on CKD [BL 2.2-2.4]; cause unclear.  Not uremic at this time.  Check CK. Renal US to eval size and cortical thickness.  Dose all medications for appropriate creatinine clerance.  Avoid NSAIDs, IV Contrast. Follow volume status as best able.   2. MSSA bacteremia and diskitis on cefazolin 3. Anemia, multifactorial  Alex Heck MD 08/15/2012, 2:36 PM

## 2012-08-16 ENCOUNTER — Other Ambulatory Visit (HOSPITAL_COMMUNITY): Payer: Medicare Other

## 2012-08-16 LAB — BASIC METABOLIC PANEL
CO2: 19 mEq/L (ref 19–32)
Chloride: 107 mEq/L (ref 96–112)
Creatinine, Ser: 3.38 mg/dL — ABNORMAL HIGH (ref 0.50–1.35)
Glucose, Bld: 118 mg/dL — ABNORMAL HIGH (ref 70–99)

## 2012-08-16 LAB — CBC
HCT: 25.1 % — ABNORMAL LOW (ref 39.0–52.0)
MCV: 95.1 fL (ref 78.0–100.0)
RBC: 2.64 MIL/uL — ABNORMAL LOW (ref 4.22–5.81)
RDW: 17.7 % — ABNORMAL HIGH (ref 11.5–15.5)
WBC: 3.6 10*3/uL — ABNORMAL LOW (ref 4.0–10.5)

## 2012-08-16 LAB — PROTIME-INR: INR: 1.03 (ref 0.00–1.49)

## 2012-08-16 MED ORDER — CEFAZOLIN SODIUM-DEXTROSE 2-3 GM-% IV SOLR
2.0000 g | Freq: Two times a day (BID) | INTRAVENOUS | Status: DC
  Administered 2012-08-16 – 2012-08-17 (×2): 2 g via INTRAVENOUS
  Filled 2012-08-16 (×3): qty 50

## 2012-08-16 MED ORDER — SODIUM BICARBONATE 650 MG PO TABS
650.0000 mg | ORAL_TABLET | Freq: Three times a day (TID) | ORAL | Status: DC
  Administered 2012-08-16 – 2012-08-17 (×4): 650 mg via ORAL
  Filled 2012-08-16 (×6): qty 1

## 2012-08-16 MED ORDER — FUROSEMIDE 10 MG/ML IJ SOLN: 60.0000 mg | Freq: Once | INTRAMUSCULAR | Status: DC

## 2012-08-16 NOTE — Clinical Social Work Note (Signed)
CSW reviewed chart and pt information with RNCM.  Pt may be candidate for [facility].  RNCM to inquire.  CSW remains available to assist.  Vickii Penna, LCSWA [phone]  Clinical Social Work

## 2012-08-16 NOTE — Progress Notes (Signed)
Physical Therapy Treatment Patient Details Name: Alex Petty MRN: [id number] DOB: November 07, 1945 Today's Date: 08/16/2012 Time: 4132-4401 PT Time Calculation (min): 31 min  PT Assessment / Plan / Recommendation  History of Present Illness 67 yo morbidly obese male admitted 7/21 for septic shock with neutropenia. Patient started on vanc/primaxin/acyclovir/micafungin, but narrowed to current vancomycin and cefazolin for cultures revealing staph aureus. Source likely R thigh vesicular lesions/R foot   PT Comments   Pt reports pain in back overall is better controlled today but spasms with movement cont to limit mobility progression.     Follow Up Recommendations  SNF;Supervision/Assistance - 24 hour     Does the patient have the potential to tolerate intense rehabilitation     Barriers to Discharge        Equipment Recommendations  Other (comment) (TBD)    Recommendations for Other Services    Frequency Min 3X/week   Progress towards PT Goals Progress towards PT goals: Progressing toward goals (slowly)  Plan Discharge plan needs to be updated    Precautions / Restrictions Precautions Precautions: Fall Restrictions Weight Bearing Restrictions: No   Pertinent Vitals/Pain 4/10 back.  C/o back spasms with movement.  RN administered IV pain medication at beginning of session    Mobility  Bed Mobility Bed Mobility: Supine to Sit;Sitting - Scoot to Edge of Bed;Sit to Supine;Scooting to [facility] Supine to Sit: 1: +2 Total assist;HOB elevated;With rails Supine to Sit: Patient Percentage: 60% Sitting - Scoot to Edge of Bed: 2: Max assist Sit to Supine: 1: +2 Total assist;HOB flat Sit to Supine: Patient Percentage: 40% Scooting to [facility]: With rail;Other (comment) (trendelenberg position) Details for Bed Mobility Assistance: HOB elevated ~60 degrees due to pt's request & him stating that worked well for him last session.  (A) for LE's, & to lift shoulders/trunk to sitting upright with use of  draw pad to pivot hips around to EOB.  (A) to lift LE's back into bed & to control/guide shoulders to supine position.  Attempted to have pt lay onto side but unsuccessful.   Pt used headboard to pull himself to [facility] with bed in trendelenberg position.  Transfers Details for Transfer Assistance: attempted to stand but unable to clear hips off bed. Pt c/o bil LE pain L>R & back pain.     Ambulation/Gait Ambulation/Gait Assistance: Not tested (comment)       PT Goals (current goals can now be found in the care plan section)    Visit Information  Last PT Received On: 08/16/12 Assistance Needed: +2 History of Present Illness: 67 yo morbidly obese male admitted 7/21 for septic shock with neutropenia. Patient started on vanc/primaxin/acyclovir/micafungin, but narrowed to current vancomycin and cefazolin for cultures revealing staph aureus. Source likely R thigh vesicular lesions/R foot    Subjective Data      Cognition  Cognition Arousal/Alertness: Awake/alert Behavior During Therapy: WFL for tasks assessed/performed Overall Cognitive Status: Within Functional Limits for tasks assessed    Balance  Static Sitting Balance Static Sitting - Balance Support: Feet supported;Bilateral upper extremity supported Static Sitting - Level of Assistance: 5: Stand by assistance Static Sitting - Comment/# of Minutes: 15 mins.    End of Session PT - End of Session Equipment Utilized During Treatment: Oxygen Patient left: in bed;with call bell/phone within reach;with family/visitor present Nurse Communication: Mobility status   GP     Lara Mulch 08/16/2012, 2:54 PM   Verdell Face, PTA [phone] 08/16/2012

## 2012-08-16 NOTE — Progress Notes (Signed)
42M with AoCKD [BL 2.2-2.4] in setting of treatment 7/16 for CLL (bendamustine and rituximab) and MSSA bacteremia with lumbar diskitis, persistent anemia.    Subjective:  Renal US yesterday, results below CK normal Good appetite No SOB   08/05 0701 - 08/06 0700 In: 1350 [P.O.:1200; IV Piggyback:150] Out: 1200 [Urine:1200]  Current meds: reviewed includes cefazolin, LMWH, prednisone 40 (gout, ends 8/9) Current Labs: reviewed and SCr slightly inc to 3.38, BUN 67, K 4.5, NaHCO3 19   Physical Exam:  Blood pressure 134/76, pulse 101, temperature 98.2 F (36.8 C), temperature source Oral, resp. rate 13, height 5' 8.5" (1.74 m), weight 155.6 kg (343 lb 0.6 oz), SpO2 99.00%.  NAD, obese, breathing cmofortably in bed RRR nl s1s2 no rub CTAB. abd obese, soft, nontender EXT: ongoing massive 4+ LEE SKIN: perhaps palmar purple rash is somewhat improved.    8/5 Renal US: Right Kidney: Right renal length is 12.0 cm. There is some  cortical thinning. There is increased echogenicity of renal  parenchyma. A small cyst of the midportion measures 1.0 x 1.0 x  1.1 cm in diameter.  Left Kidney: Left renal length is 11.7 cm. There is cortical  thinning with increased echogenicity of renal parenchyma. A lower  pole cyst is seen measuring 2.5 x 2.4 x 1.8 cm. This was also  visualized on previous study appears simple.  Examination of each kidney shows no evidence of hydronephrosis,  solid mass, or calculus.  Bladder: No urinary bladder abnormality is identified.  Incidental note is made of ascites. Incidental note is made of  sludge within the gallbladder.  IMPRESSION:  Renal lengths appear normal. There is bilateral cortical thinning  with bilateral increased echogenicity of renal parenchyma  consistent with medical renal disease.    Assessment/Plan 1. AoCKD: Good UOP and K normal.  CT A/P and renal US w/o obstruction.  Likely pt has ATN at this point. Other causes unlikely 2. MSSA bacteremia  and lumbar diskitis: per ID, on cefazolin 3. Anemia, deferred to oncology 2/2 malignancy with active chemotherapy 4. Mild metabolic acidosis, normal AG.  Start NaHCO3 650 TID  [name] MD 08/16/2012, 9:30 AM   Recent Labs Lab 08/13/12 0500 08/15/12 0945 08/16/12 0830  NA 147* 144 139  K 2.9* 4.1 4.5  CL 115* 111 107  CO2 23 22 19   GLUCOSE 121* 133* 118*  BUN 53* 62* 67*  CREATININE 2.97* 3.30* 3.38*  CALCIUM 7.9* 8.1* 8.0*    Recent Labs Lab 08/13/12 1435  08/14/12 1000 08/15/12 0945 08/16/12 0830  WBC 3.2*  --   --  3.2* 3.6*  HGB 6.8*  < > 7.3* 7.3* 7.9*  HCT 21.5*  < > 23.4* 23.4* 25.1*  MCV 96.4  --   --  95.5 95.1  PLT 173  --   --  204 243  < > = values in this interval not displayed.  Current Facility-Administered Medications  Medication Dose Route Frequency Provider Last Rate Last Dose  . acetaminophen (TYLENOL) suppository 650 mg  650 mg Rectal Q4H PRN Lonia Blood, MD      . antiseptic oral rinse (BIOTENE) solution 15 mL  15 mL Mouth Rinse q12n4p Coralyn Helling, MD   15 mL at 08/15/12 1902  . ceFAZolin (ANCEF) IVPB 2 g/50 mL premix  2 g Intravenous Q8H Arman Filter, RPH   2 g at 08/16/12 1610  . chlorhexidine (PERIDEX) 0.12 % solution 15 mL  15 mL Mouth Rinse BID Coralyn Helling, MD  15 mL at 08/16/12 0832  . diltiazem (CARDIZEM CD) 24 hr capsule 180 mg  180 mg Oral Angelina Sheriff, MD   180 mg at 08/16/12 0833  . enoxaparin (LOVENOX) injection 40 mg  40 mg Subcutaneous Q24H Lonia Blood, MD   40 mg at 08/16/12 0719  . famotidine (PEPCID) tablet 20 mg  20 mg Oral BID Calvert Cantor, MD   20 mg at 08/15/12 2238  . heparin lock flush 100 unit/mL  250 Units Intracatheter PRN Ellouise Newer, PA-C      . lactose free nutrition (BOOST PLUS) liquid 237 mL  237 mL Oral TID WC Calvert Cantor, MD   237 mL at 08/16/12 0835  . morphine 2 MG/ML injection 2-4 mg  2-4 mg Intravenous Q2H PRN Lonia Blood, MD   2 mg at 08/14/12 0853  . ondansetron (ZOFRAN)  injection 4 mg  4 mg Intravenous Q4H PRN Lonia Blood, MD   4 mg at 08/06/12 1542  . predniSONE (DELTASONE) tablet 40 mg  40 mg Oral Q breakfast Calvert Cantor, MD   40 mg at 08/16/12 0832  . promethazine (PHENERGAN) injection 12.5 mg  12.5 mg Intravenous Q6H PRN Lonia Blood, MD   12.5 mg at 08/06/12 1414  . sodium chloride 0.9 % injection 10-40 mL  10-40 mL Intracatheter PRN Lonia Blood, MD   10 mL at 08/11/12 2237  . sodium chloride 0.9 % injection 10-40 mL  10-40 mL Intracatheter Q12H Lonia Blood, MD   10 mL at 08/15/12 2237  . vitamin B-12 (CYANOCOBALAMIN) tablet 1,000 mcg  1,000 mcg Oral Daily Calvert Cantor, MD   1,000 mcg at 08/15/12 1020

## 2012-08-16 NOTE — Progress Notes (Signed)
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 He developed rash on right thigh originally thought to be zoster. He was started on zovirax as outpt. He developed progressive weakness to the point he had trouble walking. He was becoming more confused. He had fever to 103F. Family took him to [facility] ED. He was found to be hypotensive, febrile, anemic, and neutropenic. He had CVL placed, was given IV fluid, transfused 1 unit PRBC, and started on ABx. He was transfered to [facility] for further assessment.   While at [facility], the pt has been diagnosed w/ Staph bacteremia.  As a result, his Port-A-Cath has been removed.  He has also required tx for an ileus.    Hospital summary:   7/21 - Transfer to [facility] on PCCM service, PRBC transfusion  7/23 - [facility] assumed care of pt  7/25 - pt developed hematemesis - INR found to be >6.0 7/25 - 7/26 - transfused FFP, PRBC, and Vitamin K 10mg  7/26 - to OR for Port-A-Cath removal 7/27 - 7/29 - had NG tube for ileus 7/30 - MRI reveals lumbar discitis and infection and left psoas inflammation  8/1 - 8/2 - developed left knee swelling >> Ortho consult >> L knee aspirate c/w gout  8/4 - Hgb fluctuating so anticoag changed to DVT prophy only  8/4 - PICC removed per ID - repeat blood cx ordered   Assessment/Plan:  Neutropenic fever with Sepsis - Staph aureus bacteremia  - did not require pressors - lactate cleared - possible sources were Rt thigh lesions and right foot - confirmed to be staph on 7/23  - Port-A-Cath removed 7/26  - TTE negative for endocarditis - MRI L spine revealed discitis and psoas muscle inflammation - Neurosurgery called per ID recommendations  - Dr. Mikal Plane called Dr. Butler Denmark stating there is no need for surgery and therefore patient does not need a consult - ID concerned  that PICC could be infected, and asked that it be d/c  - per ID, HE NEEDS MINIMUM OF 8 WEEKS OF IV CEFAZOLIN with day #1 being day of first negative blood cultures AFTER REMOVAL OF PICC (collected 8/5 - NGTD as of 8/6)  Back pain - lumbar discitis  - MRI reveals lumbar discitis - per pt, pain has improved significantly since admission, but is greatly exacerbated by movement  - Per ID will need repeat MRI of the spine prior to stopping the IV antibiotics to ensure psoas infection clearing and no progression of diskitis   Illeus - low grade coffee ground emesis - KUB confirmed ileus - suspect pt has suffered mild mallory-weiss tear due to vomiting in setting of coagulopathy, vs/ chemo associated mucositis  - NG tube came out while patient was having MRI performed - currently tolerating regular diet, though appetite is poor   Anemia Hgb dropped slowly but consistently - no obvious source of bleeding noted - anemia panel c/w ongoing infection/"chronic disease" - decrease anticoag to DVT prophy dose for now (pt informed of risk of CVA) - transfuse as needed to keep Hgb >7.0 - CT abd/pelvis done without contrast did not reveal retroperitoneal bleed - f/u CBC again in AM  Severe anasarca - Hesitant to diurese due to steadily worsening renal function - gave lasix on 8/1 but renal function worsened  -will attempt to dose with lasix again  and follow renal function   Coagulopathy 2nd to chronic coumadin for A fib INR was markedly elevated at 6.74 in setting of coffee ground emesis - was given Vitamin K 10mg  + 4 U FFP for reversal - resumed anticoag w/ lovenox on 7/28  and coumadin on 8/2- no obvious bleed noted, but with dropping Hgb switched to DVT prophy lovenox only on 8/4  Left knee swelling - ID asked for an Ortho consult to aspirate the knee - fluid reveals gout crystals - prednisone x 5 days  Hypokalemia - due to diarrhea- resolved  Diarrhea - started after diet resumed once ileus  resolved - C dif neg - no further diarrhea 8/6  Pustular folliculitis Cont abx coverage - rash has essentially resolved  Venous stasis dermatitis  Cellulitis of leg, left resolved w/ abx tx  Pancytopenia 2nd to chemotherapy for CLL neupogen x 1 on 7/21 - improvement finally noted on 8/1- consulted oncology - see Dr Alver Fisher note  Chronic Afib Now tolerating PO Cardizem as ileus resolved - lovenox >> coumadin currently on hold as discussed above - pt and wife counseled on risk for CVA  Acute on chronic renal failure - Cr increased to 3.06 on 7/21 - baseline cr 2.8 - now up to 3.38 -  Renal following  Acute toxic metabolic encephalopathy  -2nd to sepsis - now resolved - folate is normal - B12 low at 276 so replacement ordered    Hx of HTN BP well controlled at this time - follow   Code Status: FULL Family Communication: with wife and pt at bedside  Disposition Plan: SDU - hopeful for LTAC transfer Thurs if Hgb stable   Consultants: PCCM >> [facility] ID Gen Surgery Orthopedics  Nephrology   Antibiotics: Cefazolin 7/22 >> Primaxin 7/21 >> 7/22 Acyclovir 7/21 >> 7/22 Micafungin 7/21 >> 7/22 Vancomycin 7/21 >> 7/24  DVT prophylaxis: SCDs >>  lovenox  HPI/Subjective: No new complaints.  Denies diarrhea today.  No sob, n/v, or abdom pain.    Objective: Blood pressure 125/72, pulse 99, temperature 97.8 F (36.6 C), temperature source Oral, resp. rate 14, height 5' 8.5" (1.74 m), weight 155.6 kg (343 lb 0.6 oz), SpO2 100.00%.  Intake/Output Summary (Last 24 hours) at 08/16/12 1550 Last data filed at 08/16/12 0800  Gross per 24 hour  Intake    700 ml  Output    975 ml  Net   -275 ml    Exam: General: No acute respiratory distress at rest in bed  Lungs: Clear to auscultation bilaterally without wheezes or crackles - BS are distant Cardiovascular: reg rate w/o appreciable gallup or rub  Abdomen: morbidly obese, nontender, protuberant, soft, bowel sounds slow, no rebound,  no ascites, no appreciable mass- pitting edema in flanks Extremities: change of chronic venous stasis B - severe edema in thighs/ legs - charcot joints B ankles - hands becoming so edematous pt can't flex fingers  Data Reviewed: Basic Metabolic Panel:  Recent Labs Lab 08/11/12 1420 08/12/12 0500 08/13/12 0500 08/13/12 0930 08/15/12 0945 08/16/12 0830  NA 147* 149* 147*  --  144 139  K 3.1* 3.0* 2.9*  --  4.1 4.5  CL 115* 115* 115*  --  111 107  CO2 22 24 23   --  22 19  GLUCOSE 143* 126* 121*  --  133* 118*  BUN 51* 53* 53*  --  62* 67*  CREATININE 2.54* 2.85* 2.97*  --  3.30* 3.38*  CALCIUM 7.8* 8.0* 7.9*  --  8.1* 8.0*  MG  --   --   --  1.8  --   --    Liver Function Tests: No results found for this basename: AST, ALT, ALKPHOS, BILITOT, PROT, ALBUMIN,  in the last 168 hours CBC:  Recent Labs Lab 08/11/12 1700 08/13/12 0930 08/13/12 1435 08/14/12 0200 08/14/12 1000 08/15/12 0945 08/16/12 0830  WBC 1.7* 3.0* 3.2*  --   --  3.2* 3.6*  HGB 7.6* 6.6* 6.8* 6.5* 7.3* 7.3* 7.9*  HCT 24.1* 21.5* 21.5* 21.1* 23.4* 23.4* 25.1*  MCV 94.9 95.6 96.4  --   --  95.5 95.1  PLT 133* 168 173  --   --  204 243     Recent Results (from the past 240 hour(s))  CLOSTRIDIUM DIFFICILE BY PCR     Status: None   Collection Time    08/10/12  8:00 PM      Result Value Range Status   C difficile by pcr NEGATIVE  NEGATIVE Final  GRAM STAIN     Status: None   Collection Time    08/12/12  1:06 PM      Result Value Range Status   Specimen Description SYNOVIAL LEFT KNEE   Final   Special Requests NONE   Final   Gram Stain     Final   Value: ABUNDANT WBC PRESENT, PREDOMINANTLY PMN     NO ORGANISMS SEEN     MUHORO D.,RN 08.02.14 1333 BY JONESJ   Report Status 08/12/2012 FINAL   Final  BODY FLUID CULTURE     Status: None   Collection Time    08/12/12  1:06 PM      Result Value Range Status   Specimen Description SYNOVIAL LEFT KNEE   Final   Special Requests NONE   Final   Gram Stain      Final   Value: ABUNDANT WBC PRESENT, PREDOMINANTLY PMN     NO ORGANISMS SEEN     MUHORO D RN 08.02.14 1333 BY JONESJ Performed at [facility]     Performed at [facility]   Culture     Final   Value: NO GROWTH 3 DAYS     Performed at [facility]   Report Status 08/15/2012 FINAL   Final  CULTURE, BLOOD (ROUTINE X 2)     Status: None   Collection Time    08/15/12  9:45 AM      Result Value Range Status   Specimen Description BLOOD RIGHT ARM   Final   Special Requests BOTTLES DRAWN AEROBIC AND ANAEROBIC 10CC   Final   Culture  Setup Time     Final   Value: 08/15/2012 14:03     Performed at [facility]   Culture     Final   Value:        BLOOD CULTURE RECEIVED NO GROWTH TO DATE CULTURE WILL BE HELD FOR 5 DAYS BEFORE ISSUING A FINAL NEGATIVE REPORT     Performed at [facility]   Report Status PENDING   Incomplete  CULTURE, BLOOD (ROUTINE X 2)     Status: None   Collection Time    08/15/12  1:54 PM      Result Value Range Status   Specimen Description BLOOD RIGHT HAND   Final   Special Requests BOTTLES DRAWN AEROBIC ONLY 1CC   Final   Culture  Setup Time     Final   Value: 08/15/2012 22:40  Performed at [facility]     Final   Value:        BLOOD CULTURE RECEIVED NO GROWTH TO DATE CULTURE WILL BE HELD FOR 5 DAYS BEFORE ISSUING A FINAL NEGATIVE REPORT     Performed at [facility]   Report Status PENDING   Incomplete     Studies:  Recent x-ray studies have been reviewed in detail by the Attending Physician  Scheduled Meds:  Scheduled Meds: . antiseptic oral rinse  15 mL Mouth Rinse q12n4p  .  ceFAZolin (ANCEF) IV  2 g Intravenous Q12H  . chlorhexidine  15 mL Mouth Rinse BID  . diltiazem  180 mg Oral [id number]  . enoxaparin (LOVENOX) injection  40 mg Subcutaneous Q24H  . famotidine  20 mg Oral BID  . lactose free nutrition  237 mL Oral TID WC  . predniSONE  40 mg Oral Q breakfast  . sodium bicarbonate   650 mg Oral TID  . sodium chloride  10-40 mL Intracatheter Q12H  . vitamin B-12  1,000 mcg Oral Daily    Time spent on care of this patient: 35 mins   Farron Lafond T, MD  [facility] Office  [phone] Pager - Text Page per Loretha Stapler as per below:  On-Call/Text Page:      [email or web address]      password [email or web address]  If 7PM-7AM, please contact night-coverage [email or web address] Password [email or web address] 08/16/2012, 3:50 PM   LOS: 16 days

## 2012-08-17 ENCOUNTER — Inpatient Hospital Stay (HOSPITAL_COMMUNITY): Payer: Medicare Other

## 2012-08-17 ENCOUNTER — Inpatient Hospital Stay
Admission: AD | Admit: 2012-08-17 | Discharge: 2012-10-25 | Disposition: A | Discharge status: Skilled Nursing Facility | Payer: Self-pay | Source: Ambulatory Visit | Attending: Internal Medicine | Admitting: Internal Medicine

## 2012-08-17 ENCOUNTER — Ambulatory Visit (HOSPITAL_COMMUNITY): Payer: Medicare Other | Admitting: Oncology

## 2012-08-17 LAB — RENAL FUNCTION PANEL
BUN: 71 mg/dL — ABNORMAL HIGH (ref 6–23)
CO2: 20 mEq/L (ref 19–32)
Chloride: 109 mEq/L (ref 96–112)
Creatinine, Ser: 3.42 mg/dL — ABNORMAL HIGH (ref 0.50–1.35)

## 2012-08-17 LAB — CBC
HCT: 25.1 % — ABNORMAL LOW (ref 39.0–52.0)
MCV: 94 fL (ref 78.0–100.0)
RBC: 2.67 MIL/uL — ABNORMAL LOW (ref 4.22–5.81)
WBC: 4 10*3/uL (ref 4.0–10.5)

## 2012-08-17 MED ORDER — BOOST PLUS PO LIQD: 237.0000 mL | Freq: Three times a day (TID) | ORAL | Status: DC

## 2012-08-17 MED ORDER — FAMOTIDINE 20 MG PO TABS: 20.0000 mg | ORAL_TABLET | Freq: Two times a day (BID) | ORAL | Status: DC

## 2012-08-17 MED ORDER — CEFAZOLIN SODIUM-DEXTROSE 2-3 GM-% IV SOLR: 2.0000 g | Freq: Two times a day (BID) | INTRAVENOUS | Status: DC

## 2012-08-17 MED ORDER — SODIUM BICARBONATE 650 MG PO TABS: 650.0000 mg | ORAL_TABLET | Freq: Three times a day (TID) | ORAL | Status: DC

## 2012-08-17 MED ORDER — ENOXAPARIN SODIUM 150 MG/ML ~~LOC~~ SOLN: 150.0000 mg | Freq: Two times a day (BID) | SUBCUTANEOUS | Status: DC

## 2012-08-17 MED ORDER — DILTIAZEM HCL ER COATED BEADS 180 MG PO CP24: 180.0000 mg | ORAL_CAPSULE | ORAL | Status: DC

## 2012-08-17 MED ORDER — CYANOCOBALAMIN 1000 MCG PO TABS: 1000.0000 ug | ORAL_TABLET | Freq: Every day | ORAL | Status: AC

## 2012-08-17 MED ORDER — PREDNISONE 20 MG PO TABS: 40.0000 mg | ORAL_TABLET | Freq: Every day | ORAL | Status: AC

## 2012-08-17 NOTE — Discharge Summary (Signed)
Physician Discharge Summary  CHENEY EWART WUJ:[id number] DOB: 22-Sep-1945 DOA: 07/31/2012  PCP: [name], MD  Admit date: 07/31/2012 Discharge date: 08/17/2012  Time spent: >45 minutes  Recommendations for Outpatient Follow-up:  1. Please give iron infusions for low iron levels and then give PO iron 2. follow renal function closely 3. Place PICC tomorrow if blood cultures negative 4. ID f/u in 2 wks with Dr Daiva Eves  Discharge Diagnoses:  Principal Problem: MSSA sepsis/ bacteremia/ discitis Active Problems:   Gout - acute left knee   Acute on chronic kidney disease, stage 3   Venous stasis dermatitis   A-fib on anticoagulation   CLL (chronic lymphocytic leukemia)   Neutropenic fever   Antineoplastic chemotherapy induced pancytopenia   Pustular folliculitis- right thigh   Acute back pain/Discitis of lumbar region   Discharge Condition: fair  Diet recommendation: heart healthy  Filed Weights   08/15/12 0300 08/16/12 0436 08/17/12 0436  Weight: 155.6 kg (343 lb 0.6 oz) 155.6 kg (343 lb 0.6 oz) 155.6 kg (343 lb 0.6 oz)    History of present illness:  This is a 67 y/o male with h/o CLL diagnosed in early July of this year, on chemo with bendamustine/rituxan, Gout, severe pedal edema (lymphedema?), a-fib, CKD, Colon Cancer 2005 and HTN. He has chronic issues with left leg cellulitis as well.   He presented to [facility] with a fever of 103, confusion, weakness and cellulitis of right leg, lumbar pain. He was transferred to [facility] the same day and admitted to the ICU with sepsis. He had severe pancytopenia due to chemo and ARF.   Hospital Course:  Neutropenic fever with Sepsis - Staph aureus bacteremia/ Lumbar discitis and Psoas infection  - did not require pressors -possible sources are Rt thigh lesions and right foot - confirmed to be staph on 7/23  - Port-A-Cath found to be infected and removed 7/26  - TTE negative for endocarditis  - admitted with severe back pain-  MRI L spine (7/30) revealed L3-L4 and L4-L5 discitis and left psoas muscle inflammation - Neurosurgery called per ID recommendations - Dr. Mikal Plane called Dr. Butler Denmark stating there is no need for surgery and therefore patient does not need a consult at this time - ID realized that PICC line was in place when blood cultures were positive therefore tecnically infected - PICC removed on 8/5 - per ID, HE NEEDS MINIMUM OF 8 WEEKS OF IV CEFAZOLIN with day #1 being day of first negative blood cultures AFTER REMOVAL OF PICC (8/5)  - blood cultures drawn on 8/5- if negative, replace a PICC and then continue antibiotics for a full 8 more weeks from 8/5.   Back pain - lumbar discitis  - MRI reveals lumbar discitis  - per pt, pain has improved significantly since admission, but is greatly exacerbated by movement  - Per ID will need repeat MRI of the spine prior to stopping the IV antibiotics to ensure psoas infection clearing and no progression of diskitis  - PRn Morphine for back pain  right thigh Pustular folliculitis and right leg cellulitis - was the source of this infection - previously suspected to be shingles- given valtrex  Pancytopenia 2nd to chemotherapy for CLL  neupogen x 1 on 7/21 -- consulted oncology - Seen by Dr Clelia Croft who noted it was a prolonged pancytopenia related to chemo-- improvement finally noted on 8/1 with normalizaion of WBC and Platelets (hgb was still border line low- 7-8 range)  Ileus and low grade  coffee ground emesis   - suspect pt has suffered mild mallory-weiss tear due to vomiting in setting of coagulopathy - this resolved quickly with correction of INR and did not recur  Ileus 7/24 - NG tube placed due to continued vomiting - NG tube came out while patient was having MRI of back performed  - currently tolerating regular diet, though appetite is poor   Acute on chronic renal failure  - Cr increased to 3.06 on 7/21 - baseline cr 2.8 - now up to 3.30 - requested renal  consult - they feels his renal function is stable and also state he may have mild ATN - please follow renal function closely  Anemia  - baseline Hgb 9-10 - initially due to chemo dropping to 6.7 - was transfused 1 unit on admission - has received multiple blood transfusions since - total 6 Units - hgb upto 8.1 today -Ct abd/pelvis done a few days ago when Hgb was noted to drop again- negative for bleed and no obvious GI bleed noted in past few days - anemia panel reveals low iron levels and high ferritin (due to acute infection) - this is likely the reason for dropping Hgb -  Iron/TIBC/Ferritin    Component Value Date/Time   IRON 17* 08/13/2012 1600   TIBC 141* 08/13/2012 1600   FERRITIN 1512* 08/13/2012 1600   - start Iron- may need infusions while at [facility].   Chronic Afib  Now tolerating PO Cardizem as ileus resolved - lovenox >    Coagulopathy 2nd to chronic coumadin for A fib  INR was markedly elevated at 6.74 in setting of coffee ground emesis - was given Vitamin K 10mg  + 4 U FFP for reversal - resumed anticoag w/ lovenox on 7/28 and coumadin on 8/2- no obvious bleed noted, but with dropping Hgb switching to DVT prophy lovenox only on 8/4 - will resume full dose on d/c  Chronic severe edema of legs/ Venous stasis dermatitis   - Hesitant to diurese due to steadily worsening renal function  - clearly need treatment as his legs were likely the source of this sepsis!!  Left knee swelling/ Gout - 8/2 - ID asked for an Ortho consult to aspirate the knee  - fluid reveals gout crystals - not septic knee - prednisone started 8/4 x 5 days   Diarrhea  - started after diet resumed once ileus resolved - C dif neg - improved now   Cont abx coverage - rash has essentially resolved    Acute toxic metabolic encephalopathy  -2nd to sepsis - now resolved - folate is normal - B12 low at 276 so replacement ordered   Hx of HTN  BP well controlled at this time/borderline hypotensive - follow-  resume antihypertensives as needed      Procedures: Rt IJ CVL 7/21 >>   7/26 - to OR for Port-A-Cath removal 8/1 - 8/2 - developed left knee swelling >> Ortho consult >> L knee aspirate c/w gout  8/4 - PICC removed per ID - repeat blood cx ordered    Consultations: PCCM  ID  Gen Surgery  Orthopedics   Antibiotics:  Cefazolin 7/22 >> will need to be for total 8 wks starting from 8/4 if blood cultures drawn on 8/4 are negative Primaxin 7/21 >> 7/22  Acyclovir 7/21 >> 7/22  Micafungin 7/21 >> 7/22  Vancomycin 7/21 >> 7/24   Discharge Exam: Filed Vitals:   08/17/12 1205  BP: 142/84  Pulse: 90  Temp: 97.8 F (  36.6 C)  Resp: 19    General: AAO x 3, no complaints  Cardiovascular: IIRR, no murmus Respiratory: CTAb/l  Ext- massive edema b/l legs Abd- soft, NT, ND. BS+  Discharge Instructions      Discharge Orders   Future Appointments Provider Department Dept Phone   08/28/2012 3:30 PM Cliffton Asters, MD [facility] [phone]   09/18/2012 11:00 AM [facility] Covering Provider [facility] [phone]   Future Orders Complete By Expires     Diet - low sodium heart healthy  As directed     Increase activity slowly  As directed     enoxaparin (LOVENOX) per pharmacy consult  As directed         Medication List    STOP taking these medications       acyclovir 400 MG tablet  Commonly known as:  ZOVIRAX     cyclobenzaprine 10 MG tablet  Commonly known as:  FLEXERIL     dexamethasone 4 MG tablet  Commonly known as:  DECADRON     diphenhydrAMINE 25 MG tablet  Commonly known as:  BENADRYL     doxazosin 4 MG tablet  Commonly known as:  CARDURA     febuxostat 40 MG tablet  Commonly known as:  ULORIC     fenofibrate 54 MG tablet     indomethacin 50 MG capsule  Commonly known as:  INDOCIN     lidocaine-prilocaine cream  Commonly known as:  EMLA     mupirocin ointment 2 %  Commonly known as:  BACTROBAN      ondansetron 8 MG tablet  Commonly known as:  ZOFRAN     sulfamethoxazole-trimethoprim 800-160 MG per tablet  Commonly known as:  BACTRIM DS,SEPTRA DS     torsemide 20 MG tablet  Commonly known as:  DEMADEX     valACYclovir 1000 MG tablet  Commonly known as:  VALTREX      TAKE these medications       acetaminophen 325 MG tablet  Commonly known as:  TYLENOL  Take by mouth. Take 2 (325mg  tabs) 1 hour prior to Rituxan treatment.     allopurinol 300 MG tablet  Commonly known as:  ZYLOPRIM  Take 1 tablet (300 mg total) by mouth daily.     BOSWELLIA SERRATA PO  Take 1 capsule by mouth daily. With Vitamin D3     ceFAZolin 2-3 GM-% Solr  Commonly known as:  ANCEF  Inject 50 mLs (2 g total) into the vein every 12 (twelve) hours.     cyanocobalamin 1000 MCG tablet  Take 1 tablet (1,000 mcg total) by mouth daily.     diltiazem 180 MG 24 hr capsule  Commonly known as:  CARDIZEM CD  Take 1 capsule (180 mg total) by mouth every morning.     enoxaparin 150 MG/ML injection  Commonly known as:  LOVENOX  Inject 1 mL (150 mg total) into the skin every 12 (twelve) hours.     famotidine 20 MG tablet  Commonly known as:  PEPCID  Take 1 tablet (20 mg total) by mouth 2 (two) times daily.     GLUCOSAMINE 1500 COMPLEX PO  Take 1 tablet by mouth every morning.     HYDROcodone-acetaminophen 7.5-325 MG per tablet  Commonly known as:  NORCO  Take 1 tablet by mouth every 6 (six) hours as needed for pain.     HYDROmorphone 4 MG tablet  Commonly known as:  DILAUDID  Take 1 tablet (4 mg total) by mouth every 6 (six) hours as needed for pain.     lactose free nutrition Liqd  Take 237 mLs by mouth 3 (three) times daily with meals.     multivitamins ther. w/minerals Tabs tablet  Take 1 tablet by mouth every morning.     predniSONE 20 MG tablet  Commonly known as:  DELTASONE  Take 2 tablets (40 mg total) by mouth daily with breakfast.  Start taking on:  08/18/2012     sodium bicarbonate  650 MG tablet  Take 1 tablet (650 mg total) by mouth 3 (three) times daily.     vitamin C 500 MG tablet  Commonly known as:  ASCORBIC ACID  Take 500 mg by mouth daily.     warfarin 5 MG tablet  Commonly known as:  COUMADIN  Take 1 tablet (5 mg total) by mouth daily.       Allergies  Allergen Reactions  . Ace Inhibitors Other (See Comments)    cough  . Rocephin (Ceftriaxone Sodium) Hives  . Beta Adrenergic Blockers Rash   Follow-up Information   Follow up with [name], MD.   [place] [address] informationSidney Ace [place] [id number]        The results of significant diagnostics from this hospitalization (including imaging, microbiology, ancillary and laboratory) are listed below for reference.    Significant Diagnostic Studies: Ct Abdomen Pelvis Wo Contrast  08/15/2012   *RADIOLOGY REPORT*  Clinical Data: Drop in hemoglobin.  On blood thinner.  Chronic renal disease. Atrial fibrillation.  Hypertension.  Chronic lymphocytic leukemia undergoing chemotherapy.  Bacteremia. Disc/facet infection.  Psoas muscle infection.  CT ABDOMEN AND PELVIS WITHOUT CONTRAST  Technique:  Multidetector CT imaging of the abdomen and pelvis was performed following the standard protocol without intravenous contrast.  Comparison: 08/10/2012 lumbar spine MR.  06/13/2012 PET CT.  Findings: Destruction L3 vertebral body consistent with the patient's history of diskitis.  There is extension of inflammatory process into the left psoas muscle on the prior MR.  There remains mild asymmetry of the psoas muscles but without large collection to suggest retroperitoneal hemorrhage as cause of the patient's drop in hematocrit.  Interval decrease in size of previously noted (PET CT of the 06/13/2012) adenopathy.  Residual lymph nodes remain with surrounding hazy inflammation suggesting result of treatment. Largest remaining lymph nodes noted in the left inguinal region measures 2.6 x 1.5 cm maximal dimension.  Spleen remains enlarged  spanning over 15.6 cm.  Diffuse third spacing of fluid including small pleural effusions greater on the right and prominent subcutaneous edema/fluid collection greater on the left.  It would be difficult to exclude subcutaneous/flank blood given the history although this would be unusual and would be apparent on this coronal examination.  Prior sigmoid resection.  Prominent diverticula throughout the colon most prominent descending colon and the sigmoid colon. Limited for detection of inflammation given the third spacing of fluid. No free air.  Fluid and gas filled slightly prominent size loops of small bowel without point of obstruction noted.  Radiopaque containing normal sized appendix.  This may represent the presence of appendicoliths or barium.  Mild circumferential thickening of the cecum and proximal ascending colon may be related to under distension although limiting detection of colitis.  Tiny amount of gas in the bladder may be related to recent catheterization.  Clinical correlation recommended.  Small gallstones with dense bile.  Slightly lobulated appearance of the liver.  Cirrhosis not excluded.  Evaluation  of solid abdominal viscera is limited by lack of IV contrast.  Taking this limitation into account no obvious hepatic, splenic, pancreatic, adrenal or renal lesion.  Atherosclerotic type changes of the aorta and iliac arteries. Dilated iliac arteries measuring up to 2.2 cm bilaterally.  IMPRESSION: The source of decreased hematocrit is not identified with certainty.  Please see above discussion.  Decrease in size although incomplete resolution of adenopathy. Residual splenomegaly.  Diffuse third spacing of fluid most notable left flank subcutaneous region.  Evaluation of bowel is limited by the third spacing of fluid please see above discussion.  Calcified gallstones.  L3-4 destruction consistent with the patient's history of diskitis.  Dilated iliac arteries measuring up to 2.2 cm bilaterally.    Original Report Authenticated By: Lacy Duverney, M.D.   Dg Eye Foreign Body  08/09/2012   *RADIOLOGY REPORT*  Clinical Data: Pre MRI.  History well and working with metal.  ORBITS FOR FOREIGN BODY - 2 VIEW  Comparison: None.  Findings: No radiopaque foreign body is projected over either orbit.  An NG tube is in place.  IMPRESSION: No radiopaque foreign body projected over either orbit.   Original Report Authenticated By: Marin Roberts, M.D.   Mr Lumbar Spine Wo Contrast  08/09/2012   *RADIOLOGY REPORT*  Clinical Data: Staph aureus bacteremia.  Acute low back pain.  MRI LUMBAR SPINE WITHOUT CONTRAST  Technique:  Multiplanar and multiecho pulse sequences of the lumbar spine were obtained without intravenous contrast.  Comparison: MRI lumbar spine without and with contrast 03/05/2004.  Findings: Normal signal is present in the conus medullaris which terminates at T12-L1, within normal limits.  Fluid signal is present in the disc space at L3-4 and L4-5. Chronic fatty end plate marrow changes are present at L3-4.  The patient is status post laminectomy and remote discectomy at this level.  There is diffuse soft tissue within the ventral epidural space posterior to the L4 and particularly the L5 vertebral body.  This is of similar signal to the L4 disc space.  Edematous end plate marrow changes are present at L4-5.  Retroperitoneal lymph nodes are present.  At this asymmetric left- sided edematous changes extend into the psoas muscle.  The disc levels at L2-3 above are normal.  L3-4:  Chronic end plate osteophytes and facet hypertrophy contribute to mild foraminal stenosis bilaterally.  Left laminectomy was performed.  Mild right lateral recess narrowing is present.  L4-5:  The ventral epidural soft tissue compatible with disc material or infection displaces the thecal sac posteriorly.  This results in moderate central canal stenosis.  Mild foraminal narrowing is present bilaterally.  Diffuse asymmetric  left-sided edematous changes are present.  There is fluid in the facet joints bilaterally.  L5-S1:  Minimal fluid signal is present in this disc as well.  The patient is status post left laminectomy.  There is fluid in the left facet joint.  A pars defect is present on the left.  This is chronic.  IMPRESSION:  1.  Diffuse fluid signal throughout the L3-4 and L4-5 lumbar discs suggesting infection. 2.  Contiguous heterogeneous soft tissue extending into the ventral epidural space at L4 and L5 and narrowing the central canal is concerning for extruded disc material or infected flight month. 3.  Asymmetric edematous changes within the left psoas muscle, the left L4-5 and L5-S1 facet joints and posterior musculature is concerning for infection. 4.  Postoperative changes at L3-4 and L5-S1.  These results were called by telephone on 08/09/2012 at  06:50 p.m. to Dr. Luciana Axe, who verbally acknowledged these results.   Original Report Authenticated By: Marin Roberts, M.D.   US Renal  08/15/2012   *RADIOLOGY REPORT*  Clinical Data: History of acute renal insufficiency and chronic renal disease.  RENAL/URINARY TRACT ULTRASOUND COMPLETE  Comparison:  CT 08/15/2012.  Ultrasound 10/16/2010.  Findings:  Right Kidney:  Right renal length is 12.0 cm.  There is some cortical thinning.  There is increased echogenicity of renal parenchyma.  A small cyst of the midportion measures 1.0 x 1.0 x 1.1 cm in diameter.  Left Kidney:  Left renal length is 11.7 cm.  There is cortical thinning with increased echogenicity of renal parenchyma.  A lower pole cyst is seen measuring 2.5 x 2.4 x 1.8 cm. This was also visualized on previous study appears simple.  Examination of each kidney shows no evidence of hydronephrosis, solid mass, or calculus.  Bladder:  No urinary bladder abnormality is identified.  Incidental note is made of ascites.  Incidental note is made of sludge within the gallbladder.  IMPRESSION: Renal lengths appear normal.  There  is bilateral cortical thinning with bilateral increased  echogenicity of renal parenchyma consistent with medical renal disease.   Original Report Authenticated By: Onalee Hua Call   Dg Chest Port 1 View  08/07/2012   *RADIOLOGY REPORT*  Clinical Data: None.  Left upper extremity PICC.  PORTABLE CHEST - 1 VIEW  Comparison: 07/31/2012.  Findings: Cardiopericardial silhouette is mildly enlarged. Airspace disease and atelectasis at the right lung base.  Right IJ central line is unchanged.  Enteric tube is present.  Left upper extremity PICC is new and the tip is in the mid SVC, just inferior to the level of the carina. Sclerosis of the left humerus is present and there are severe degenerative changes of left and right glenohumeral joints.  IMPRESSION: New left upper extremity PICC with the tip in the mid superior vena cava just inferior to the level of the carina.  Interval placement of enteric tube with the tip not visualized.   Original Report Authenticated By: Andreas Newport, M.D.   Dg Chest Portable 1 View  07/31/2012   *RADIOLOGY REPORT*  Clinical Data: Central line placement  PORTABLE CHEST - 1 VIEW  Comparison: Portable exam 1401 hours compared to 1214 hours  Findings: New right jugular central venous catheter with tip projecting over SVC. Right subclavian Port-A-Cath again identified. Enlargement of cardiac silhouette with pulmonary vascular congestion. Lungs grossly clear. No pleural effusion, pneumothorax or acute osseous findings. Lateral left costophrenic angle excluded.  IMPRESSION: Enlargement of cardiac silhouette with pulmonary vascular congestion. No pneumothorax following right jugular line placement.   Original Report Authenticated By: Ulyses Southward, M.D.   Dg Chest Port 1 View  07/31/2012   *RADIOLOGY REPORT*  Clinical Data: Sepsis, hypotension, history colon cancer, CLL, hypertension, chronic kidney disease, atrial fibrillation  PORTABLE CHEST - 1 VIEW  Comparison: Portable exam 1214 hours  compared to 07/11/2012  Findings: Right subclavian Port-A-Cath stable tip projecting over SVC. Enlargement of cardiac silhouette with pulmonary vascular congestion. No gross infiltrate, pleural effusion or pneumothorax. Minimal peribronchial thickening. Pronounced left glenohumeral degenerative changes.  IMPRESSION: Enlargement of cardiac silhouette with pulmonary vascular congestion. No acute infiltrate.   Original Report Authenticated By: Ulyses Southward, M.D.   Dg Abd Portable 1v  08/10/2012   *RADIOLOGY REPORT*  Clinical Data: Follow-up of ileus.  PORTABLE ABDOMEN - 1 VIEW  Comparison: 08/06/2012  Findings: There is worsening gaseous dilatation of the small bowel with  maximal caliber of small bowel approaching 6.5 cm.  Previously visualized nasogastric tube appears to have been removed.  Findings are worrisome for worsening small bowel obstruction rather than ileus.  IMPRESSION: Worsening small bowel dilatation consistent with small bowel obstruction.  Previously seen nasogastric tube appears to have been removed.  Maximal small bowel caliber approaches 6.5 cm.   Original Report Authenticated By: Irish Lack, M.D.   Dg Abd Portable 1v  08/06/2012   *RADIOLOGY REPORT*  Clinical Data: NG tube placement.  Dilated bowel.  PORTABLE ABDOMEN - 1 VIEW 7:24 p.m.  Comparison: 08/06/2012 at 6:53 a.m.  Findings: NG tube has been inserted.  The tip is in the fundus of the stomach and appears to be just beyond the gastroesophageal junction.  There is extensive air in the large and small bowel.  IMPRESSION: NG tube tip just beyond the gastroesophageal junction.   Original Report Authenticated By: Francene Boyers, M.D.   Dg Abd Portable 1v  08/06/2012   *RADIOLOGY REPORT*  Clinical Data: Ileus  PORTABLE ABDOMEN - 1 VIEW  Comparison: August 03, 2012  Findings: There is persistent generalized bowel dilatation with small bowel loops slightly more dilated compared to recent prior study.  No free air is seen on this supine  examination.  There is moderate stool in the colon.  IMPRESSION: Slight increase in generalized bowel dilatation.  Suspect ileus, although a degree of obstruction is not excluded on this study.  No free air is seen on this supine examination.   Original Report Authenticated By: Bretta Bang, M.D.   Dg Abd Portable 2v  08/03/2012   *RADIOLOGY REPORT*  Clinical Data: Vomiting.  PORTABLE ABDOMEN - 2 VIEW  Comparison: None  Findings: There is diffuse gaseous distention of both large and small bowel.  I suspect represents ileus.  No free air.  No organomegaly or suspicious calcification.  No acute bony abnormality.  IMPRESSION: Diffuse gaseous distention of bowel, likely ileus.   Original Report Authenticated By: Charlett Nose, M.D.    Microbiology: Recent Results (from the past 240 hour(s))  CLOSTRIDIUM DIFFICILE BY PCR     Status: None   Collection Time    08/10/12  8:00 PM      Result Value Range Status   C difficile by pcr NEGATIVE  NEGATIVE Final  GRAM STAIN     Status: None   Collection Time    08/12/12  1:06 PM      Result Value Range Status   Specimen Description SYNOVIAL LEFT KNEE   Final   Special Requests NONE   Final   Gram Stain     Final   Value: ABUNDANT WBC PRESENT, PREDOMINANTLY PMN     NO ORGANISMS SEEN     MUHORO D.,RN 08.02.14 1333 BY JONESJ   Report Status 08/12/2012 FINAL   Final  BODY FLUID CULTURE     Status: None   Collection Time    08/12/12  1:06 PM      Result Value Range Status   Specimen Description SYNOVIAL LEFT KNEE   Final   Special Requests NONE   Final   Gram Stain     Final   Value: ABUNDANT WBC PRESENT, PREDOMINANTLY PMN     NO ORGANISMS SEEN     MUHORO D RN 08.02.14 1333 BY JONESJ Performed at [facility]     Performed at [facility]   Culture     Final   Value: NO GROWTH 3 DAYS  Performed at [facility]   Report Status 08/15/2012 FINAL   Final  CULTURE, BLOOD (ROUTINE X 2)     Status: None   Collection Time     08/15/12  9:45 AM      Result Value Range Status   Specimen Description BLOOD RIGHT ARM   Final   Special Requests BOTTLES DRAWN AEROBIC AND ANAEROBIC 10CC   Final   Culture  Setup Time     Final   Value: 08/15/2012 14:03     Performed at [facility]   Culture     Final   Value:        BLOOD CULTURE RECEIVED NO GROWTH TO DATE CULTURE WILL BE HELD FOR 5 DAYS BEFORE ISSUING A FINAL NEGATIVE REPORT     Performed at [facility]   Report Status PENDING   Incomplete  CULTURE, BLOOD (ROUTINE X 2)     Status: None   Collection Time    08/15/12  1:54 PM      Result Value Range Status   Specimen Description BLOOD RIGHT HAND   Final   Special Requests BOTTLES DRAWN AEROBIC ONLY 1CC   Final   Culture  Setup Time     Final   Value: 08/15/2012 22:40     Performed at [facility] [employer]   Culture     Final   Value:        BLOOD CULTURE RECEIVED NO GROWTH TO DATE CULTURE WILL BE HELD FOR 5 DAYS BEFORE ISSUING A FINAL NEGATIVE REPORT     Performed at [facility]   Report Status PENDING   Incomplete     Labs: Basic Metabolic Panel:  Recent Labs Lab 08/12/12 0500 08/13/12 0500 08/13/12 0930 08/15/12 0945 08/16/12 0830 08/17/12 0515  NA 149* 147*  --  144 139 140  K 3.0* 2.9*  --  4.1 4.5 4.6  CL 115* 115*  --  111 107 109  CO2 24 23  --  22 19 20   GLUCOSE 126* 121*  --  133* 118* 137*  BUN 53* 53*  --  62* 67* 71*  CREATININE 2.85* 2.97*  --  3.30* 3.38* 3.42*  CALCIUM 8.0* 7.9*  --  8.1* 8.0* 8.0*  MG  --   --  1.8  --   --   --   PHOS  --   --   --   --   --  4.5   Liver Function Tests:  Recent Labs Lab 08/17/12 0515  ALBUMIN 1.7*   No results found for this basename: LIPASE, AMYLASE,  in the last 168 hours No results found for this basename: AMMONIA,  in the last 168 hours CBC:  Recent Labs Lab 08/13/12 0930 08/13/12 1435 08/14/12 0200 08/14/12 1000 08/15/12 0945 08/16/12 0830 08/17/12 0515  WBC 3.0* 3.2*  --   --  3.2* 3.6* 4.0  HGB  6.6* 6.8* 6.5* 7.3* 7.3* 7.9* 8.1*  HCT 21.5* 21.5* 21.1* 23.4* 23.4* 25.1* 25.1*  MCV 95.6 96.4  --   --  95.5 95.1 94.0  PLT 168 173  --   --  204 243 272   Cardiac Enzymes:  Recent Labs Lab 08/15/12 1530  CKTOTAL 18   BNP: BNP (last 3 results) No results found for this basename: PROBNP,  in the last 8760 hours CBG: No results found for this basename: GLUCAP,  in the last 168 hours     Signed:  [facility]  [facility] 08/17/2012, 12:09 PM

## 2012-08-17 NOTE — Progress Notes (Signed)
Attempted to call report nurse not available at this time but she will call back as soon as she can /protocal in calling report

## 2012-08-17 NOTE — Progress Notes (Signed)
34M with AoCKD [BL 2.2-2.4] in setting of treatment 7/16 for CLL (bendamustine and rituximab) and MSSA bacteremia with lumbar diskitis, persistent anemia.    Subjective:  IV lasix 60 yesterday for massive LEE, minimal change in UOP Attempted OOB yesterday  08/06 0701 - 08/07 0700 In: 940 [P.O.:840; IV Piggyback:100] Out: 1075 [Urine:1075]  Current meds: reviewed Current Labs: reviewed   Physical Exam:  Blood pressure 125/71, pulse 103, temperature 97.9 F (36.6 C), temperature source Oral, resp. rate 16, height 5' 8.5" (1.74 m), weight 155.6 kg (343 lb 0.6 oz), SpO2 19.00%. NAD, eating breakfast RRR Clear b/l S/nt/nd Massive 4+ LEE, hyperpigmentation  Assessment/Plan 1. AoCKD: Stable over past 24h.  Nonoliguric. No change to plans right now 2. MSSA bacteremia and lumbar diskitis: per ID, on cefazolin  3. Anemia, deferred to oncology 2/2 malignancy with active chemotherapy  4. Mild metabolic acidosis, normal AG. On NaHCO3 5. Massive edema: ok with attempted diuresis unless renal function worsens.  Certainly this much edema, which is chronic, will inihibit ability to recover.     [name] MD 08/17/2012, 8:55 AM   Recent Labs Lab 08/15/12 0945 08/16/12 0830 08/17/12 0515  NA 144 139 140  K 4.1 4.5 4.6  CL 111 107 109  CO2 22 19 20   GLUCOSE 133* 118* 137*  BUN 62* 67* 71*  CREATININE 3.30* 3.38* 3.42*  CALCIUM 8.1* 8.0* 8.0*  PHOS  --   --  4.5    Recent Labs Lab 08/15/12 0945 08/16/12 0830 08/17/12 0515  WBC 3.2* 3.6* 4.0  HGB 7.3* 7.9* 8.1*  HCT 23.4* 25.1* 25.1*  MCV 95.5 95.1 94.0  PLT 204 243 272    Current Facility-Administered Medications  Medication Dose Route Frequency Provider Last Rate Last Dose  . acetaminophen (TYLENOL) suppository 650 mg  650 mg Rectal Q4H PRN Lonia Blood, MD      . antiseptic oral rinse (BIOTENE) solution 15 mL  15 mL Mouth Rinse q12n4p Coralyn Helling, MD   15 mL at 08/16/12 1637  . ceFAZolin (ANCEF) IVPB 2 g/50 mL  premix  2 g Intravenous Q12H Lonia Blood, MD   2 g at 08/16/12 2221  . chlorhexidine (PERIDEX) 0.12 % solution 15 mL  15 mL Mouth Rinse BID Coralyn Helling, MD   15 mL at 08/17/12 0739  . diltiazem (CARDIZEM CD) 24 hr capsule 180 mg  180 mg Oral Angelina Sheriff, MD   180 mg at 08/17/12 0739  . enoxaparin (LOVENOX) injection 40 mg  40 mg Subcutaneous Q24H Lonia Blood, MD   40 mg at 08/17/12 0742  . famotidine (PEPCID) tablet 20 mg  20 mg Oral BID Calvert Cantor, MD   20 mg at 08/16/12 2216  . furosemide (LASIX) injection 60 mg  60 mg Intravenous Once Lonia Blood, MD      . lactose free nutrition (BOOST PLUS) liquid 237 mL  237 mL Oral TID WC Calvert Cantor, MD   237 mL at 08/17/12 0739  . morphine 2 MG/ML injection 2-4 mg  2-4 mg Intravenous Q2H PRN Lonia Blood, MD   2 mg at 08/16/12 0946  . ondansetron (ZOFRAN) injection 4 mg  4 mg Intravenous Q4H PRN Lonia Blood, MD   4 mg at 08/06/12 1542  . predniSONE (DELTASONE) tablet 40 mg  40 mg Oral Q breakfast Calvert Cantor, MD   40 mg at 08/17/12 0739  . promethazine (PHENERGAN) injection 12.5 mg  12.5 mg Intravenous Q6H PRN  Lonia Blood, MD   12.5 mg at 08/06/12 1414  . sodium bicarbonate tablet 650 mg  650 mg Oral TID Arita Miss, MD   650 mg at 08/16/12 2216  . sodium chloride 0.9 % injection 10-40 mL  10-40 mL Intracatheter PRN Lonia Blood, MD   10 mL at 08/11/12 2237  . sodium chloride 0.9 % injection 10-40 mL  10-40 mL Intracatheter Q12H Lonia Blood, MD   10 mL at 08/16/12 2224  . vitamin B-12 (CYANOCOBALAMIN) tablet 1,000 mcg  1,000 mcg Oral Daily Calvert Cantor, MD   1,000 mcg at 08/16/12 [phone]

## 2012-08-17 NOTE — Progress Notes (Signed)
Report given to nurse in [facility] aware of move at bedside . Patient am care done and  Will allow to eat his lunch before move since it is lunch  Time nurse informed that he would be up after this

## 2012-08-18 ENCOUNTER — Inpatient Hospital Stay (HOSPITAL_COMMUNITY): Payer: Medicare Other

## 2012-08-18 LAB — CBC WITH DIFFERENTIAL/PLATELET
Basophils Absolute: 0 10*3/uL (ref 0.0–0.1)
Eosinophils Absolute: 0 10*3/uL (ref 0.0–0.7)
Lymphocytes Relative: 5 % — ABNORMAL LOW (ref 12–46)
MCH: 29.3 pg (ref 26.0–34.0)
MCHC: 31.3 g/dL (ref 30.0–36.0)
Monocytes Absolute: 0.2 10*3/uL (ref 0.1–1.0)
Neutrophils Relative %: 91 % — ABNORMAL HIGH (ref 43–77)
Platelets: 231 10*3/uL (ref 150–400)
RDW: 17.6 % — ABNORMAL HIGH (ref 11.5–15.5)

## 2012-08-18 LAB — C-REACTIVE PROTEIN: CRP: <0.5 mg/dL — ABNORMAL LOW (ref <0.60)

## 2012-08-18 LAB — COMPREHENSIVE METABOLIC PANEL
ALT: <5 U/L (ref 0–53)
AST: 11 U/L (ref 0–37)
CO2: 19 mEq/L (ref 19–32)
Calcium: 7.9 mg/dL — ABNORMAL LOW (ref 8.4–10.5)
Creatinine, Ser: 3.53 mg/dL — ABNORMAL HIGH (ref 0.50–1.35)
GFR calc Af Amer: 19 mL/min — ABNORMAL LOW (ref >90)
GFR calc non Af Amer: 17 mL/min — ABNORMAL LOW (ref >90)
Sodium: 140 mEq/L (ref 135–145)
Total Protein: 4.5 g/dL — ABNORMAL LOW (ref 6.0–8.3)

## 2012-08-18 LAB — SEDIMENTATION RATE: Sed Rate: 48 mm/hr — ABNORMAL HIGH (ref 0–16)

## 2012-08-18 LAB — PREALBUMIN: Prealbumin: 24.2 mg/dL (ref 17.0–34.0)

## 2012-08-18 LAB — PHOSPHORUS: Phosphorus: 4.6 mg/dL (ref 2.3–4.6)

## 2012-08-19 LAB — PROTIME-INR: INR: 1.39 (ref 0.00–1.49)

## 2012-08-19 LAB — BASIC METABOLIC PANEL
BUN: 79 mg/dL — ABNORMAL HIGH (ref 6–23)
CO2: 20 mEq/L (ref 19–32)
Glucose, Bld: 108 mg/dL — ABNORMAL HIGH (ref 70–99)
Potassium: 4.4 mEq/L (ref 3.5–5.1)
Sodium: 140 mEq/L (ref 135–145)

## 2012-08-20 LAB — BASIC METABOLIC PANEL
CO2: 20 mEq/L (ref 19–32)
Chloride: 105 mEq/L (ref 96–112)
Glucose, Bld: 128 mg/dL — ABNORMAL HIGH (ref 70–99)
Potassium: 4.5 mEq/L (ref 3.5–5.1)
Sodium: 136 mEq/L (ref 135–145)

## 2012-08-20 LAB — PROTIME-INR
INR: 1.52 — ABNORMAL HIGH (ref 0.00–1.49)
Prothrombin Time: 17.9 seconds — ABNORMAL HIGH (ref 11.6–15.2)

## 2012-08-21 LAB — CBC WITH DIFFERENTIAL/PLATELET
Eosinophils Relative: 0 % (ref 0–5)
Lymphocytes Relative: 5 % — ABNORMAL LOW (ref 12–46)
MCH: 30.2 pg (ref 26.0–34.0)
Monocytes Absolute: 0.2 10*3/uL (ref 0.1–1.0)
Neutrophils Relative %: 92 % — ABNORMAL HIGH (ref 43–77)
Platelets: 290 10*3/uL (ref 150–400)
RBC: 2.81 MIL/uL — ABNORMAL LOW (ref 4.22–5.81)
WBC: 7.4 10*3/uL (ref 4.0–10.5)

## 2012-08-21 LAB — PROTIME-INR
INR: 1.87 — ABNORMAL HIGH (ref 0.00–1.49)
Prothrombin Time: 21 seconds — ABNORMAL HIGH (ref 11.6–15.2)

## 2012-08-21 LAB — BASIC METABOLIC PANEL
CO2: 18 mEq/L — ABNORMAL LOW (ref 19–32)
Chloride: 106 mEq/L (ref 96–112)
Creatinine, Ser: 3.1 mg/dL — ABNORMAL HIGH (ref 0.50–1.35)
GFR calc Af Amer: 22 mL/min — ABNORMAL LOW (ref >90)
Potassium: 4.7 mEq/L (ref 3.5–5.1)
Sodium: 138 mEq/L (ref 135–145)

## 2012-08-21 LAB — CULTURE, BLOOD (ROUTINE X 2): Culture: NO GROWTH

## 2012-08-22 LAB — BASIC METABOLIC PANEL
Calcium: 7.6 mg/dL — ABNORMAL LOW (ref 8.4–10.5)
Chloride: 104 mEq/L (ref 96–112)
Creatinine, Ser: 3.1 mg/dL — ABNORMAL HIGH (ref 0.50–1.35)
GFR calc Af Amer: 22 mL/min — ABNORMAL LOW (ref >90)
Sodium: 136 mEq/L (ref 135–145)

## 2012-08-22 LAB — PROTIME-INR: Prothrombin Time: 27.4 seconds — ABNORMAL HIGH (ref 11.6–15.2)

## 2012-08-23 LAB — PROTIME-INR: INR: 2.83 — ABNORMAL HIGH (ref 0.00–1.49)

## 2012-08-24 ENCOUNTER — Telehealth: Payer: Self-pay | Admitting: *Deleted

## 2012-08-24 LAB — BASIC METABOLIC PANEL
BUN: 88 mg/dL — ABNORMAL HIGH (ref 6–23)
CO2: 21 mEq/L (ref 19–32)
Calcium: 7.5 mg/dL — ABNORMAL LOW (ref 8.4–10.5)
Creatinine, Ser: 3.46 mg/dL — ABNORMAL HIGH (ref 0.50–1.35)
Glucose, Bld: 113 mg/dL — ABNORMAL HIGH (ref 70–99)

## 2012-08-24 LAB — CBC
Hemoglobin: 5.2 g/dL — CL (ref 13.0–17.0)
MCH: 30.6 pg (ref 26.0–34.0)
MCH: 31 pg (ref 26.0–34.0)
MCV: 93.5 fL (ref 78.0–100.0)
MCV: 94 fL (ref 78.0–100.0)
Platelets: 223 10*3/uL (ref 150–400)
RBC: 1.7 MIL/uL — ABNORMAL LOW (ref 4.22–5.81)
RDW: 19.2 % — ABNORMAL HIGH (ref 11.5–15.5)

## 2012-08-24 LAB — PREPARE RBC (CROSSMATCH)

## 2012-08-24 NOTE — Telephone Encounter (Signed)
[facility] appt already canceled due to pt remaining in [facility] rather than SN[facility].

## 2012-08-25 ENCOUNTER — Other Ambulatory Visit (HOSPITAL_COMMUNITY): Payer: Self-pay

## 2012-08-25 LAB — PREPARE RBC (CROSSMATCH)

## 2012-08-25 LAB — BASIC METABOLIC PANEL
BUN: 92 mg/dL — ABNORMAL HIGH (ref 6–23)
CO2: 21 mEq/L (ref 19–32)
Chloride: 103 mEq/L (ref 96–112)
Glucose, Bld: 120 mg/dL — ABNORMAL HIGH (ref 70–99)
Potassium: 5.3 mEq/L — ABNORMAL HIGH (ref 3.5–5.1)
Sodium: 136 mEq/L (ref 135–145)

## 2012-08-25 LAB — URINALYSIS, ROUTINE W REFLEX MICROSCOPIC
Nitrite: POSITIVE — AB
Protein, ur: 30 mg/dL — AB
Urobilinogen, UA: 0.2 mg/dL (ref 0.0–1.0)

## 2012-08-25 LAB — CBC
HCT: 18.9 % — ABNORMAL LOW (ref 39.0–52.0)
HCT: 17.5 % — ABNORMAL LOW (ref 39.0–52.0)
Hemoglobin: 5.9 g/dL — CL (ref 13.0–17.0)
Hemoglobin: 5.7 g/dL — CL (ref 13.0–17.0)
MCH: 30.2 pg (ref 26.0–34.0)
MCHC: 32.6 g/dL (ref 30.0–36.0)
MCV: 92.6 fL (ref 78.0–100.0)
RBC: 2.04 MIL/uL — ABNORMAL LOW (ref 4.22–5.81)
RBC: 1.89 MIL/uL — ABNORMAL LOW (ref 4.22–5.81)

## 2012-08-25 LAB — PROTIME-INR
INR: 2.45 — ABNORMAL HIGH (ref 0.00–1.49)
Prothrombin Time: 25.8 seconds — ABNORMAL HIGH (ref 11.6–15.2)

## 2012-08-25 LAB — URINE MICROSCOPIC-ADD ON

## 2012-08-26 ENCOUNTER — Other Ambulatory Visit (HOSPITAL_COMMUNITY): Payer: Self-pay

## 2012-08-26 LAB — BASIC METABOLIC PANEL
BUN: 93 mg/dL — ABNORMAL HIGH (ref 6–23)
CO2: 20 mEq/L (ref 19–32)
Glucose, Bld: 143 mg/dL — ABNORMAL HIGH (ref 70–99)
Potassium: 5.2 mEq/L — ABNORMAL HIGH (ref 3.5–5.1)
Sodium: 136 mEq/L (ref 135–145)

## 2012-08-26 LAB — CBC
HCT: 17.9 % — ABNORMAL LOW (ref 39.0–52.0)
Hemoglobin: 5.8 g/dL — CL (ref 13.0–17.0)
Hemoglobin: 6.4 g/dL — CL (ref 13.0–17.0)
MCH: 30.2 pg (ref 26.0–34.0)
MCH: 32.2 pg (ref 26.0–34.0)
MCHC: 32.4 g/dL (ref 30.0–36.0)
MCV: 93.2 fL (ref 78.0–100.0)
MCV: 93.5 fL (ref 78.0–100.0)
RBC: 1.99 MIL/uL — ABNORMAL LOW (ref 4.22–5.81)
RDW: 19 % — ABNORMAL HIGH (ref 11.5–15.5)

## 2012-08-26 LAB — PROTIME-INR: INR: 1.75 — ABNORMAL HIGH (ref 0.00–1.49)

## 2012-08-26 LAB — MAGNESIUM: Magnesium: 1.8 mg/dL (ref 1.5–2.5)

## 2012-08-27 ENCOUNTER — Other Ambulatory Visit (HOSPITAL_COMMUNITY): Payer: Self-pay

## 2012-08-27 LAB — TYPE AND SCREEN
ABO/RH(D): A POS
Antibody Screen: NEGATIVE
Unit division: 0
Unit division: 0
Unit division: 0
Unit division: 0
Unit division: 0
Unit division: 0

## 2012-08-27 LAB — URINE CULTURE: Colony Count: >=100000

## 2012-08-27 LAB — CBC
HCT: 24.6 % — ABNORMAL LOW (ref 39.0–52.0)
HCT: 22.6 % — ABNORMAL LOW (ref 39.0–52.0)
Hemoglobin: 8.1 g/dL — ABNORMAL LOW (ref 13.0–17.0)
Hemoglobin: 7.7 g/dL — ABNORMAL LOW (ref 13.0–17.0)
MCH: 31.8 pg (ref 26.0–34.0)
MCHC: 34.1 g/dL (ref 30.0–36.0)
MCV: 92.5 fL (ref 78.0–100.0)
RBC: 2.42 MIL/uL — ABNORMAL LOW (ref 4.22–5.81)
RDW: 18.6 % — ABNORMAL HIGH (ref 11.5–15.5)
WBC: 15.5 10*3/uL — ABNORMAL HIGH (ref 4.0–10.5)

## 2012-08-27 LAB — BASIC METABOLIC PANEL
BUN: 100 mg/dL — ABNORMAL HIGH (ref 6–23)
CO2: 21 mEq/L (ref 19–32)
Chloride: 102 mEq/L (ref 96–112)
Creatinine, Ser: 3.83 mg/dL — ABNORMAL HIGH (ref 0.50–1.35)
Glucose, Bld: 143 mg/dL — ABNORMAL HIGH (ref 70–99)
Potassium: 5.5 mEq/L — ABNORMAL HIGH (ref 3.5–5.1)

## 2012-08-28 ENCOUNTER — Other Ambulatory Visit (HOSPITAL_COMMUNITY): Payer: Self-pay

## 2012-08-28 ENCOUNTER — Inpatient Hospital Stay: Payer: Medicare Other | Admitting: Internal Medicine

## 2012-08-28 LAB — BASIC METABOLIC PANEL
BUN: 103 mg/dL — ABNORMAL HIGH (ref 6–23)
CO2: 18 mEq/L — ABNORMAL LOW (ref 19–32)
Chloride: 102 mEq/L (ref 96–112)
GFR calc non Af Amer: 16 mL/min — ABNORMAL LOW (ref >90)
Glucose, Bld: 135 mg/dL — ABNORMAL HIGH (ref 70–99)
Potassium: 5.9 mEq/L — ABNORMAL HIGH (ref 3.5–5.1)
Sodium: 134 mEq/L — ABNORMAL LOW (ref 135–145)

## 2012-08-28 LAB — BLOOD GAS, ARTERIAL
Acid-base deficit: 6.5 mmol/L — ABNORMAL HIGH (ref 0.0–2.0)
Bicarbonate: 19.2 mEq/L — ABNORMAL LOW (ref 20.0–24.0)
FIO2: 0.21 %
pCO2 arterial: 42.8 mmHg (ref 35.0–45.0)
pO2, Arterial: 108 mmHg — ABNORMAL HIGH (ref 80.0–100.0)

## 2012-08-28 LAB — CBC
HCT: 20.7 % — ABNORMAL LOW (ref 39.0–52.0)
MCH: 30.6 pg (ref 26.0–34.0)
MCV: 94.5 fL (ref 78.0–100.0)
RDW: 18.5 % — ABNORMAL HIGH (ref 11.5–15.5)
WBC: 13.5 10*3/uL — ABNORMAL HIGH (ref 4.0–10.5)

## 2012-08-29 ENCOUNTER — Encounter: Payer: Self-pay | Admitting: Radiology

## 2012-08-29 ENCOUNTER — Other Ambulatory Visit (HOSPITAL_COMMUNITY): Payer: Self-pay

## 2012-08-29 LAB — CBC WITH DIFFERENTIAL/PLATELET
Basophils Relative: 0 % (ref 0–1)
Eosinophils Absolute: 0 10*3/uL (ref 0.0–0.7)
HCT: 26.7 % — ABNORMAL LOW (ref 39.0–52.0)
Hemoglobin: 8.8 g/dL — ABNORMAL LOW (ref 13.0–17.0)
Lymphs Abs: 0.6 10*3/uL — ABNORMAL LOW (ref 0.7–4.0)
MCH: 31.3 pg (ref 26.0–34.0)
MCHC: 33 g/dL (ref 30.0–36.0)
Monocytes Absolute: 0.7 10*3/uL (ref 0.1–1.0)
Monocytes Relative: 5 % (ref 3–12)
RBC: 2.81 MIL/uL — ABNORMAL LOW (ref 4.22–5.81)

## 2012-08-29 LAB — PROTIME-INR: INR: 1.31 (ref 0.00–1.49)

## 2012-08-29 LAB — BASIC METABOLIC PANEL
BUN: 107 mg/dL — ABNORMAL HIGH (ref 6–23)
Chloride: 101 mEq/L (ref 96–112)
Creatinine, Ser: 3.49 mg/dL — ABNORMAL HIGH (ref 0.50–1.35)
GFR calc Af Amer: 19 mL/min — ABNORMAL LOW (ref >90)
Glucose, Bld: 139 mg/dL — ABNORMAL HIGH (ref 70–99)

## 2012-08-29 LAB — CBC
HCT: 25.3 % — ABNORMAL LOW (ref 39.0–52.0)
Hemoglobin: 8.3 g/dL — ABNORMAL LOW (ref 13.0–17.0)
MCHC: 32.8 g/dL (ref 30.0–36.0)
RBC: 2.66 MIL/uL — ABNORMAL LOW (ref 4.22–5.81)

## 2012-08-29 MED ORDER — HEPARIN SODIUM (PORCINE) 1000 UNIT/ML IJ SOLN
INTRAMUSCULAR | Status: AC
  Filled 2012-08-29: qty 1

## 2012-08-29 NOTE — Consult Note (Signed)
Reason for Consult: Right hip hematoma Referring Physician: Dr. Iver Nestle is an 67 y.o. male.  HPI: Patient is a 67 year old gentleman multiple medical problems including end-stage renal disease who has had a CT scan which shows a hematoma in the right gluteus medius and right posterior hip. Patient complains of right-sided radicular pain. States he has had a history of left-sided radicular pain from his degenerative disc disease.  Past Medical History  Diagnosis Date  . Arthritis   . Hypertension   . DJD (degenerative joint disease)   . DJD (degenerative joint disease), ankle and foot   . Arthritis   . Cellulitis   . Arthritis   . Chronic back pain   . Gout   . Dysrhythmia     afibrillation  . Atrial fibrillation   . Chronic kidney disease   . Colon cancer     2005  . 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 Charcot ankle     dr. Emilio Math    Past Surgical History  Procedure Laterality Date  . Foot surgery      right foot-  . Colon surgery      forcolon cancer, 2005, Dr. Marcell Anger in [place]  . Tonsillectomy    . Cystoscopy with urethral dilatation    . Back surgery      x2  . Cataract extraction w/phaco  01/14/2011    Procedure: CATARACT EXTRACTION PHACO AND INTRAOCULAR LENS PLACEMENT (IOC);  Surgeon: Gemma Payor;  Location: [facility];  Service: Ophthalmology;  Laterality: Left;  CDE=16.27  . Lymph node biopsy Right 07/11/2012    Procedure: CERVICAL LYMPH NODE BIOPSY;  Surgeon: Marlane Hatcher, MD;  Location: [facility];  Service: General;  Laterality: Right;  end @ 1101  . Portacath placement Right 07/11/2012    Procedure: INSERTION PORT-A-CATH;  Surgeon: Marlane Hatcher, MD;  Location: [facility];  Service: General;  Laterality: Right;  . Port-a-cath removal Right 08/05/2012    Procedure: REMOVAL PORT-A-CATH;  Surgeon: Shelly Rubenstein, MD;  Location: [facility];  Service: General;  Laterality: Right;    Family History  Problem Relation Age of Onset  .  Anesthesia problems Neg Hx   . Hypotension Neg Hx   . Malignant hyperthermia Neg Hx   . Pseudochol deficiency Neg Hx   . Breast cancer Mother   . Heart disease Mother     Social History:  reports that he has never smoked. He has never used smokeless tobacco. He reports that he does not drink alcohol or use illicit drugs.  Allergies:  Allergies  Allergen Reactions  . Ace Inhibitors Other (See Comments)    cough  . Rocephin [Ceftriaxone Sodium] Hives  . Beta Adrenergic Blockers Rash    Medications: I have reviewed the patient's current medications.  Results for orders placed during the hospital encounter of 08/17/12 (from the past 48 hour(s))  CBC     Status: Abnormal   Collection Time    08/27/12  7:13 PM      Result Value Range   WBC 14.9 (*) 4.0 - 10.5 K/uL   RBC 2.42 (*) 4.22 - 5.81 MIL/uL   Hemoglobin 7.7 (*) 13.0 - 17.0 g/dL   HCT 16.1 (*) 09.6 - 04.5 %   MCV 93.4  78.0 - 100.0 fL   MCH 31.8  26.0 - 34.0 pg   MCHC 34.1  30.0 - 36.0 g/dL   RDW 40.9 (*) 81.1 - 91.4 %   Platelets 173  150 - 400 K/uL  BASIC METABOLIC PANEL     Status: Abnormal   Collection Time    08/28/12  8:20 AM      Result Value Range   Sodium 134 (*) 135 - 145 mEq/L   Potassium 5.9 (*) 3.5 - 5.1 mEq/L   Chloride 102  96 - 112 mEq/L   CO2 18 (*) 19 - 32 mEq/L   Glucose, Bld 135 (*) 70 - 99 mg/dL   BUN 086 (*) 6 - 23 mg/dL   Creatinine, Ser 5.78 (*) 0.50 - 1.35 mg/dL   Calcium 7.4 (*) 8.4 - 10.5 mg/dL   GFR calc non Af Amer 16 (*) >90 mL/min   GFR calc Af Amer 18 (*) >90 mL/min   Comment: (NOTE)     The eGFR has been calculated using the CKD EPI equation.     This calculation has not been validated in all clinical situations.     eGFR's persistently <90 mL/min signify possible Chronic Kidney     Disease.  CBC     Status: Abnormal   Collection Time    08/28/12 10:00 AM      Result Value Range   WBC 13.5 (*) 4.0 - 10.5 K/uL   RBC 2.19 (*) 4.22 - 5.81 MIL/uL   Hemoglobin 6.7 (*) 13.0 - 17.0  g/dL   Comment: REPEATED TO VERIFY     CRITICAL RESULT CALLED TO, READ BACK BY AND VERIFIED WITH:     J.PENDLETON,RN 1205 08/28/12 CLARK,S   HCT 20.7 (*) 39.0 - 52.0 %   MCV 94.5  78.0 - 100.0 fL   MCH 30.6  26.0 - 34.0 pg   MCHC 32.4  30.0 - 36.0 g/dL   RDW 46.9 (*) 62.9 - 52.8 %   Platelets 165  150 - 400 K/uL  PROTIME-INR     Status: Abnormal   Collection Time    08/28/12 10:00 AM      Result Value Range   Prothrombin Time 16.5 (*) 11.6 - 15.2 seconds   INR 1.37  0.00 - 1.49  BLOOD GAS, ARTERIAL     Status: Abnormal   Collection Time    08/28/12 10:21 AM      Result Value Range   FIO2 0.21     Delivery systems ROOM AIR     pH, Arterial 7.274 (*) 7.350 - 7.450   pCO2 arterial 42.8  35.0 - 45.0 mmHg   pO2, Arterial 108.0 (*) 80.0 - 100.0 mmHg   Bicarbonate 19.2 (*) 20.0 - 24.0 mEq/L   TCO2 20.5  0 - 100 mmol/L   Acid-base deficit 6.5 (*) 0.0 - 2.0 mmol/L   O2 Saturation 98.8     Patient temperature 98.6     Collection site RIGHT RADIAL     Drawn by COLLECTED BY RT     Sample type ARTERIAL DRAW     Allens test (pass/fail) PASS  PASS  TYPE AND SCREEN     Status: None   Collection Time    08/28/12  3:00 PM      Result Value Range   ABO/RH(D) A POS     Antibody Screen NEG     Sample Expiration 08/31/2012     Unit Number [id number]     Blood Component Type RED CELLS,LR     Unit division 00     Status of Unit ISSUED     Transfusion Status OK TO TRANSFUSE     Crossmatch Result Compatible  Unit Number [id number]     Blood Component Type RED CELLS,LR     Unit division 00     Status of Unit ISSUED     Transfusion Status OK TO TRANSFUSE     Crossmatch Result Compatible     Unit Number [id number]     Blood Component Type RED CELLS,LR     Unit division 00     Status of Unit ALLOCATED     Transfusion Status OK TO TRANSFUSE     Crossmatch Result Compatible     Unit Number [id number]     Blood Component Type RED CELLS,LR     Unit division 00     Status of  Unit ALLOCATED     Transfusion Status OK TO TRANSFUSE     Crossmatch Result Compatible    PREPARE RBC (CROSSMATCH)     Status: None   Collection Time    08/28/12  3:00 PM      Result Value Range   Order Confirmation ORDER PROCESSED BY BLOOD BANK    CBC WITH DIFFERENTIAL     Status: Abnormal   Collection Time    08/29/12  2:00 AM      Result Value Range   WBC 14.7 (*) 4.0 - 10.5 K/uL   RBC 2.81 (*) 4.22 - 5.81 MIL/uL   Hemoglobin 8.8 (*) 13.0 - 17.0 g/dL   Comment: POST TRANSFUSION SPECIMEN   HCT 26.7 (*) 39.0 - 52.0 %   MCV 95.0  78.0 - 100.0 fL   MCH 31.3  26.0 - 34.0 pg   MCHC 33.0  30.0 - 36.0 g/dL   RDW 52.8 (*) 41.3 - 24.4 %   Platelets 163  150 - 400 K/uL   Neutrophils Relative % 91 (*) 43 - 77 %   Neutro Abs 13.3 (*) 1.7 - 7.7 K/uL   Lymphocytes Relative 4 (*) 12 - 46 %   Lymphs Abs 0.6 (*) 0.7 - 4.0 K/uL   Monocytes Relative 5  3 - 12 %   Monocytes Absolute 0.7  0.1 - 1.0 K/uL   Eosinophils Relative 0  0 - 5 %   Eosinophils Absolute 0.0  0.0 - 0.7 K/uL   Basophils Relative 0  0 - 1 %   Basophils Absolute 0.0  0.0 - 0.1 K/uL  BASIC METABOLIC PANEL     Status: Abnormal   Collection Time    08/29/12  2:00 AM      Result Value Range   Sodium 137  135 - 145 mEq/L   Potassium 5.1  3.5 - 5.1 mEq/L   Chloride 101  96 - 112 mEq/L   CO2 23  19 - 32 mEq/L   Glucose, Bld 139 (*) 70 - 99 mg/dL   BUN 010 (*) 6 - 23 mg/dL   Creatinine, Ser 2.72 (*) 0.50 - 1.35 mg/dL   Calcium 7.4 (*) 8.4 - 10.5 mg/dL   GFR calc non Af Amer 17 (*) >90 mL/min   GFR calc Af Amer 19 (*) >90 mL/min   Comment: (NOTE)     The eGFR has been calculated using the CKD EPI equation.     This calculation has not been validated in all clinical situations.     eGFR's persistently <90 mL/min signify possible Chronic Kidney     Disease.  PHOSPHORUS     Status: Abnormal   Collection Time    08/29/12  2:00 AM      Result Value Range   Phosphorus  5.7 (*) 2.3 - 4.6 mg/dL  MAGNESIUM     Status: None    Collection Time    08/29/12  2:00 AM      Result Value Range   Magnesium 1.8  1.5 - 2.5 mg/dL    Ct Abdomen Pelvis Wo Contrast  08/27/2012   *RADIOLOGY REPORT*  Clinical Data: Hemorrhage/hematoma.  Increasing transfusion requirement.  Bruising in the right groin pain and body.  CT ABDOMEN AND PELVIS WITHOUT CONTRAST  Technique:  Multidetector CT imaging of the abdomen and pelvis was performed following the standard protocol without intravenous contrast.  Comparison: 08/15/2012.  Findings: Lung Bases: Dependent atelectasis. Coronary artery atherosclerosis is present. If office based assessment of coronary risk factors has not been performed, it is now recommended.  Small pericardial effusion.  Low attenuation intravascular compartment suggesting anemia.  Liver:  Unenhanced CT was performed per clinician order.  Lack of IV contrast limits sensitivity and specificity, especially for evaluation of abdominal/pelvic solid viscera.  Normal.  Spleen:  Unchanged splenomegaly.  Gallbladder:  Tiny calcified gallstones layering dependently in the neck along with some biliary sludge.  Common bile duct:  Normal.  Pancreas:  Normal.  Adrenal glands:  Normal bilaterally.  Kidneys:  The renal cortical thinning.  Cyst in the anterior left inferior renal pole.  No hydronephrosis.  Ureters grossly appear within normal limits.  Stomach:  Normal.  High density in the posterior fundus consistent with contents in the enteric stream.  Small bowel:  Normal.  Colon:   Normal appendix.  Small amount of ascites along the left pericolic gutter.  Colonic diverticulosis without diverticulitis.  Pelvic Genitourinary:  Urinary bladder appears within normal limits.  Bones:  No aggressive osseous lesions.  Severe lumbar spondylosis, most pronounced at L3-L4.  Chronic left L5 pars defect.  Vasculature: Atherosclerosis and tortuosity of the common iliac arteries. 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  No acute vascular abnormality  allowing for noncontrast technique.  Body Wall: Anasarca changes are present.  The right lateral flank is excluded from view.  Infiltration of the subcutaneous tissues of both flanks is partially visualized.  There is a small subcutaneous lipoma lateral to the left hip.  There is high attenuation around the right gluteus medius with hematoma measuring 13 cm AP by 9.4 cm transverse.  This encompasses the muscle belly and overestimates the size of the hematoma.  There is also hematoma posterior to the right hip measuring 4.2 cm x 9.6 cm, probably just superficial to the right obturator externus muscle and tendon.  IMPRESSION: 1.  Hematoma around the right gluteus medius muscle. 2.  Second hematoma posterior to the right hip, incompletely visualized. 3.  Anasarca. 4.  Unchanged splenomegaly. 5.  Cholelithiasis and biliary sludge. 6.  Old changes of L3-L4 diskitis. 7.  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   Original Report Authenticated By: Andreas Newport, M.D.   Dg Chest Port 1 View  08/28/2012   *RADIOLOGY REPORT*  Clinical Data: Central line placement.  PORTABLE CHEST - 1 VIEW  Comparison: Single view of the chest 08/26/2012 and 08/25/2012.  Findings: Right IJ catheter is in place with the tip projecting over the lower superior vena cava.  No pneumothorax seen.  There is cardiomegaly without edema.  Patchy right basilar airspace disease is noted.  IMPRESSION:  1.  Tip of right IJ catheter projects in the lower superior vena cava.  Negative for pneumothorax. 2.  Patchy right basilar airspace disease could be due to  atelectasis or infection.   Original Report Authenticated By: Holley Dexter, M.D.    Review of Systems  All other systems reviewed and are negative.   There were no vitals taken for this visit. Physical Exam On examination patient has massive pitting edema of both upper and lower extremities. There is brawny skin color changes over the tibia bilaterally left  worse than right consistent with venous stasis insufficiency however patient does have massive pitting edema in the foot leg and thigh as well as the upper extremities which is more consistent with fluid retention and lymphedema. Patient can move his foot up and down no neurologic deficits but he does have right-sided radicular pain most likely due to the hematoma and the combined degenerative disc disease of his lumbar spine. Assessment/Plan: Assessment: Hematoma right posterior hip atraumatic most likely do to his anticoagulation therapy.  Plan: Will follow this expectantly. Patient does not appear to have infection or be symptomatic from the hematoma. Patient is not a surgical candidate for excision of the hematoma and with the chronic nature of the hematoma this is most likely consolidated and not amenable to aspiration. I'll followup as needed.  Keyonte Cookston V 08/29/2012, 6:55 AM

## 2012-08-29 NOTE — Procedures (Signed)
L IJ 24cm Trialysis catheter No complication No blood loss. See complete dictation in [facility].

## 2012-08-29 NOTE — H&P (Signed)
Alex Petty is an 67 y.o. male.   Chief Complaint: Chronic kidney disease Acute renal failure- worsening renal fxn Oliguric Potassium 5.1 (5.9) Scheduled for temporary dialysis catheter placement per Nephrology Dr Thedore Mins Hx MSSA bacteremia- on Ancef- in LTAC afib- on coumadin- new Rt hip hematoma HPI: HTN; Colon Ca; CLL; discitis; Afib; CKD  Past Medical History  Diagnosis Date  . Arthritis   . Hypertension   . DJD (degenerative joint disease)   . DJD (degenerative joint disease), ankle and foot   . Arthritis   . Cellulitis   . Arthritis   . Chronic back pain   . Gout   . Dysrhythmia     afibrillation  . Atrial fibrillation   . Chronic kidney disease   . Colon cancer     2005  . 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 Charcot ankle     dr. Emilio Math    Past Surgical History  Procedure Laterality Date  . Foot surgery      right foot-  . Colon surgery      forcolon cancer, 2005, Dr. Marcell Anger in [place]  . Tonsillectomy    . Cystoscopy with urethral dilatation    . Back surgery      x2  . Cataract extraction w/phaco  01/14/2011    Procedure: CATARACT EXTRACTION PHACO AND INTRAOCULAR LENS PLACEMENT (IOC);  Surgeon: Gemma Payor;  Location: [facility];  Service: Ophthalmology;  Laterality: Left;  CDE=16.27  . Lymph node biopsy Right 07/11/2012    Procedure: CERVICAL LYMPH NODE BIOPSY;  Surgeon: Marlane Hatcher, MD;  Location: [facility];  Service: General;  Laterality: Right;  end @ 1101  . Portacath placement Right 07/11/2012    Procedure: INSERTION PORT-A-CATH;  Surgeon: Marlane Hatcher, MD;  Location: [facility];  Service: General;  Laterality: Right;  . Port-a-cath removal Right 08/05/2012    Procedure: REMOVAL PORT-A-CATH;  Surgeon: Shelly Rubenstein, MD;  Location: [facility];  Service: General;  Laterality: Right;    Family History  Problem Relation Age of Onset  . Anesthesia problems Neg Hx   . Hypotension Neg Hx   . Malignant hyperthermia Neg Hx   . Pseudochol  deficiency Neg Hx   . Breast cancer Mother   . Heart disease Mother    Social History:  reports that he has never smoked. He has never used smokeless tobacco. He reports that he does not drink alcohol or use illicit drugs.  Allergies:  Allergies  Allergen Reactions  . Ace Inhibitors Other (See Comments)    cough  . Rocephin [Ceftriaxone Sodium] Hives  . Beta Adrenergic Blockers Rash    Medications Prior to Admission  Medication Sig Dispense Refill  . acetaminophen (TYLENOL) 325 MG tablet Take by mouth. Take 2 (325mg  tabs) 1 hour prior to Rituxan treatment.      Marland Kitchen allopurinol (ZYLOPRIM) 300 MG tablet Take 1 tablet (300 mg total) by mouth daily.  300 tablet  2  . BOSWELLIA SERRATA PO Take 1 capsule by mouth daily. With Vitamin D3      . ceFAZolin (ANCEF) 2-3 GM-% SOLR Inject 50 mLs (2 g total) into the vein every 12 (twelve) hours.      Marland Kitchen diltiazem (CARDIZEM CD) 180 MG 24 hr capsule Take 1 capsule (180 mg total) by mouth every morning.      . enoxaparin (LOVENOX) 150 MG/ML injection Inject 1 mL (150 mg total) into the skin every 12 (twelve) hours.  0 Syringe    .  famotidine (PEPCID) 20 MG tablet Take 1 tablet (20 mg total) by mouth 2 (two) times daily.      . Glucosamine-Chondroit-Vit C-Mn (GLUCOSAMINE 1500 COMPLEX PO) Take 1 tablet by mouth every morning.       Marland Kitchen HYDROcodone-acetaminophen (NORCO) 7.5-325 MG per tablet Take 1 tablet by mouth every 6 (six) hours as needed for pain.      Marland Kitchen HYDROmorphone (DILAUDID) 4 MG tablet Take 1 tablet (4 mg total) by mouth every 6 (six) hours as needed for pain.  20 tablet  0  . lactose free nutrition (BOOST PLUS) LIQD Take 237 mLs by mouth 3 (three) times daily with meals.    0  . Multiple Vitamins-Minerals (MULTIVITAMINS THER. W/MINERALS) TABS Take 1 tablet by mouth every morning.       . [EXPIRED] predniSONE (DELTASONE) 20 MG tablet Take 2 tablets (40 mg total) by mouth daily with breakfast.  2 tablet  0  . sodium bicarbonate 650 MG tablet Take 1  tablet (650 mg total) by mouth 3 (three) times daily.      . vitamin B-12 1000 MCG tablet Take 1 tablet (1,000 mcg total) by mouth daily.      . vitamin C (ASCORBIC ACID) 500 MG tablet Take 500 mg by mouth daily.      Marland Kitchen warfarin (COUMADIN) 5 MG tablet Take 1 tablet (5 mg total) by mouth daily.  30 tablet  1    Results for orders placed during the hospital encounter of 08/17/12 (from the past 48 hour(s))  CBC     Status: Abnormal   Collection Time    08/27/12  7:13 PM      Result Value Range   WBC 14.9 (*) 4.0 - 10.5 K/uL   RBC 2.42 (*) 4.22 - 5.81 MIL/uL   Hemoglobin 7.7 (*) 13.0 - 17.0 g/dL   HCT 91.4 (*) 78.2 - 95.6 %   MCV 93.4  78.0 - 100.0 fL   MCH 31.8  26.0 - 34.0 pg   MCHC 34.1  30.0 - 36.0 g/dL   RDW 21.3 (*) 08.6 - 57.8 %   Platelets 173  150 - 400 K/uL  BASIC METABOLIC PANEL     Status: Abnormal   Collection Time    08/28/12  8:20 AM      Result Value Range   Sodium 134 (*) 135 - 145 mEq/L   Potassium 5.9 (*) 3.5 - 5.1 mEq/L   Chloride 102  96 - 112 mEq/L   CO2 18 (*) 19 - 32 mEq/L   Glucose, Bld 135 (*) 70 - 99 mg/dL   BUN 469 (*) 6 - 23 mg/dL   Creatinine, Ser 6.29 (*) 0.50 - 1.35 mg/dL   Calcium 7.4 (*) 8.4 - 10.5 mg/dL   GFR calc non Af Amer 16 (*) >90 mL/min   GFR calc Af Amer 18 (*) >90 mL/min   Comment: (NOTE)     The eGFR has been calculated using the CKD EPI equation.     This calculation has not been validated in all clinical situations.     eGFR's persistently <90 mL/min signify possible Chronic Kidney     Disease.  CBC     Status: Abnormal   Collection Time    08/28/12 10:00 AM      Result Value Range   WBC 13.5 (*) 4.0 - 10.5 K/uL   RBC 2.19 (*) 4.22 - 5.81 MIL/uL   Hemoglobin 6.7 (*) 13.0 - 17.0 g/dL   Comment:  REPEATED TO VERIFY     CRITICAL RESULT CALLED TO, READ BACK BY AND VERIFIED WITH:     J.PENDLETON,RN 1205 08/28/12 CLARK,S   HCT 20.7 (*) 39.0 - 52.0 %   MCV 94.5  78.0 - 100.0 fL   MCH 30.6  26.0 - 34.0 pg   MCHC 32.4  30.0 - 36.0  g/dL   RDW 16.1 (*) 09.6 - 04.5 %   Platelets 165  150 - 400 K/uL  PROTIME-INR     Status: Abnormal   Collection Time    08/28/12 10:00 AM      Result Value Range   Prothrombin Time 16.5 (*) 11.6 - 15.2 seconds   INR 1.37  0.00 - 1.49  BLOOD GAS, ARTERIAL     Status: Abnormal   Collection Time    08/28/12 10:21 AM      Result Value Range   FIO2 0.21     Delivery systems ROOM AIR     pH, Arterial 7.274 (*) 7.350 - 7.450   pCO2 arterial 42.8  35.0 - 45.0 mmHg   pO2, Arterial 108.0 (*) 80.0 - 100.0 mmHg   Bicarbonate 19.2 (*) 20.0 - 24.0 mEq/L   TCO2 20.5  0 - 100 mmol/L   Acid-base deficit 6.5 (*) 0.0 - 2.0 mmol/L   O2 Saturation 98.8     Patient temperature 98.6     Collection site RIGHT RADIAL     Drawn by COLLECTED BY RT     Sample type ARTERIAL DRAW     Allens test (pass/fail) PASS  PASS  TYPE AND SCREEN     Status: None   Collection Time    08/28/12  3:00 PM      Result Value Range   ABO/RH(D) A POS     Antibody Screen NEG     Sample Expiration 08/31/2012     Unit Number [id number]     Blood Component Type RED CELLS,LR     Unit division 00     Status of Unit ISSUED     Transfusion Status OK TO TRANSFUSE     Crossmatch Result Compatible     Unit Number [id number]     Blood Component Type RED CELLS,LR     Unit division 00     Status of Unit ISSUED     Transfusion Status OK TO TRANSFUSE     Crossmatch Result Compatible     Unit Number [id number]     Blood Component Type RED CELLS,LR     Unit division 00     Status of Unit ALLOCATED     Transfusion Status OK TO TRANSFUSE     Crossmatch Result Compatible     Unit Number [id number]     Blood Component Type RED CELLS,LR     Unit division 00     Status of Unit ALLOCATED     Transfusion Status OK TO TRANSFUSE     Crossmatch Result Compatible    PREPARE RBC (CROSSMATCH)     Status: None   Collection Time    08/28/12  3:00 PM      Result Value Range   Order Confirmation ORDER PROCESSED BY BLOOD BANK     CBC WITH DIFFERENTIAL     Status: Abnormal   Collection Time    08/29/12  2:00 AM      Result Value Range   WBC 14.7 (*) 4.0 - 10.5 K/uL   RBC 2.81 (*) 4.22 - 5.81 MIL/uL   Hemoglobin 8.8 (*)  13.0 - 17.0 g/dL   Comment: POST TRANSFUSION SPECIMEN   HCT 26.7 (*) 39.0 - 52.0 %   MCV 95.0  78.0 - 100.0 fL   MCH 31.3  26.0 - 34.0 pg   MCHC 33.0  30.0 - 36.0 g/dL   RDW 16.1 (*) 09.6 - 04.5 %   Platelets 163  150 - 400 K/uL   Neutrophils Relative % 91 (*) 43 - 77 %   Neutro Abs 13.3 (*) 1.7 - 7.7 K/uL   Lymphocytes Relative 4 (*) 12 - 46 %   Lymphs Abs 0.6 (*) 0.7 - 4.0 K/uL   Monocytes Relative 5  3 - 12 %   Monocytes Absolute 0.7  0.1 - 1.0 K/uL   Eosinophils Relative 0  0 - 5 %   Eosinophils Absolute 0.0  0.0 - 0.7 K/uL   Basophils Relative 0  0 - 1 %   Basophils Absolute 0.0  0.0 - 0.1 K/uL  BASIC METABOLIC PANEL     Status: Abnormal   Collection Time    08/29/12  2:00 AM      Result Value Range   Sodium 137  135 - 145 mEq/L   Potassium 5.1  3.5 - 5.1 mEq/L   Chloride 101  96 - 112 mEq/L   CO2 23  19 - 32 mEq/L   Glucose, Bld 139 (*) 70 - 99 mg/dL   BUN 409 (*) 6 - 23 mg/dL   Creatinine, Ser 8.11 (*) 0.50 - 1.35 mg/dL   Calcium 7.4 (*) 8.4 - 10.5 mg/dL   GFR calc non Af Amer 17 (*) >90 mL/min   GFR calc Af Amer 19 (*) >90 mL/min   Comment: (NOTE)     The eGFR has been calculated using the CKD EPI equation.     This calculation has not been validated in all clinical situations.     eGFR's persistently <90 mL/min signify possible Chronic Kidney     Disease.  PHOSPHORUS     Status: Abnormal   Collection Time    08/29/12  2:00 AM      Result Value Range   Phosphorus 5.7 (*) 2.3 - 4.6 mg/dL  MAGNESIUM     Status: None   Collection Time    08/29/12  2:00 AM      Result Value Range   Magnesium 1.8  1.5 - 2.5 mg/dL   Ct Abdomen Pelvis Wo Contrast  08/27/2012   *RADIOLOGY REPORT*  Clinical Data: Hemorrhage/hematoma.  Increasing transfusion requirement.  Bruising in the  right groin pain and body.  CT ABDOMEN AND PELVIS WITHOUT CONTRAST  Technique:  Multidetector CT imaging of the abdomen and pelvis was performed following the standard protocol without intravenous contrast.  Comparison: 08/15/2012.  Findings: Lung Bases: Dependent atelectasis. Coronary artery atherosclerosis is present. If office based assessment of coronary risk factors has not been performed, it is now recommended.  Small pericardial effusion.  Low attenuation intravascular compartment suggesting anemia.  Liver:  Unenhanced CT was performed per clinician order.  Lack of IV contrast limits sensitivity and specificity, especially for evaluation of abdominal/pelvic solid viscera.  Normal.  Spleen:  Unchanged splenomegaly.  Gallbladder:  Tiny calcified gallstones layering dependently in the neck along with some biliary sludge.  Common bile duct:  Normal.  Pancreas:  Normal.  Adrenal glands:  Normal bilaterally.  Kidneys:  The renal cortical thinning.  Cyst in the anterior left inferior renal pole.  No hydronephrosis.  Ureters grossly appear within normal limits.  Stomach:  Normal.  High density in the posterior fundus consistent with contents in the enteric stream.  Small bowel:  Normal.  Colon:   Normal appendix.  Small amount of ascites along the left pericolic gutter.  Colonic diverticulosis without diverticulitis.  Pelvic Genitourinary:  Urinary bladder appears within normal limits.  Bones:  No aggressive osseous lesions.  Severe lumbar spondylosis, most pronounced at L3-L4.  Chronic left L5 pars defect.  Vasculature: Atherosclerosis and tortuosity of the common iliac arteries. 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  No acute vascular abnormality allowing for noncontrast technique.  Body Wall: Anasarca changes are present.  The right lateral flank is excluded from view.  Infiltration of the subcutaneous tissues of both flanks is partially visualized.  There is a small subcutaneous lipoma lateral to the  left hip.  There is high attenuation around the right gluteus medius with hematoma measuring 13 cm AP by 9.4 cm transverse.  This encompasses the muscle belly and overestimates the size of the hematoma.  There is also hematoma posterior to the right hip measuring 4.2 cm x 9.6 cm, probably just superficial to the right obturator externus muscle and tendon.  IMPRESSION: 1.  Hematoma around the right gluteus medius muscle. 2.  Second hematoma posterior to the right hip, incompletely visualized. 3.  Anasarca. 4.  Unchanged splenomegaly. 5.  Cholelithiasis and biliary sludge. 6.  Old changes of L3-L4 diskitis. 7.  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   Original Report Authenticated By: Andreas Newport, M.D.   Dg Chest Port 1 View  08/28/2012   *RADIOLOGY REPORT*  Clinical Data: Central line placement.  PORTABLE CHEST - 1 VIEW  Comparison: Single view of the chest 08/26/2012 and 08/25/2012.  Findings: Right IJ catheter is in place with the tip projecting over the lower superior vena cava.  No pneumothorax seen.  There is cardiomegaly without edema.  Patchy right basilar airspace disease is noted.  IMPRESSION:  1.  Tip of right IJ catheter projects in the lower superior vena cava.  Negative for pneumothorax. 2.  Patchy right basilar airspace disease could be due to atelectasis or infection.   Original Report Authenticated By: Holley Dexter, M.D.    Review of Systems  Constitutional: Positive for weight loss. Negative for fever.       Somnolent; difficult to arouse   Respiratory: Positive for shortness of breath and wheezing.   Cardiovascular: Positive for leg swelling. Negative for chest pain.  Gastrointestinal: Negative for nausea and vomiting.  Genitourinary:       UOP decreased  Neurological: Positive for weakness. Negative for headaches.    There were no vitals taken for this visit. Physical Exam  Constitutional:  anasarca  Cardiovascular: Normal rate.   irreg   Respiratory: Effort normal. He has wheezes.  GI: Soft. Bowel sounds are normal. He exhibits distension.  Neurological:  somnolent   Skin: Skin is warm.  Psychiatric:  Consented wife over phone     Assessment/Plan Acute on Chronic renall failure Worsening renal fxn; oliguric; K elevated Scheduled for temporary dialysis catheter placement pts wife aware of procedure benefits and risks and agreeable to proceed Consent signed and in chart  Ruhan Borak A 08/29/2012, 8:25 AM

## 2012-08-30 LAB — CBC
HCT: 26.5 % — ABNORMAL LOW (ref 39.0–52.0)
Hemoglobin: 8.6 g/dL — ABNORMAL LOW (ref 13.0–17.0)
MCH: 31.5 pg (ref 26.0–34.0)
MCHC: 33.1 g/dL (ref 30.0–36.0)
MCV: 95.3 fL (ref 78.0–100.0)
MCV: 95.2 fL (ref 78.0–100.0)
Platelets: 130 10*3/uL — ABNORMAL LOW (ref 150–400)
RBC: 2.78 MIL/uL — ABNORMAL LOW (ref 4.22–5.81)
RDW: 18.4 % — ABNORMAL HIGH (ref 11.5–15.5)
WBC: 13.4 10*3/uL — ABNORMAL HIGH (ref 4.0–10.5)
WBC: 11.8 10*3/uL — ABNORMAL HIGH (ref 4.0–10.5)

## 2012-08-30 LAB — RENAL FUNCTION PANEL
BUN: 94 mg/dL — ABNORMAL HIGH (ref 6–23)
CO2: 25 mEq/L (ref 19–32)
Chloride: 102 mEq/L (ref 96–112)
GFR calc Af Amer: 23 mL/min — ABNORMAL LOW (ref >90)
Glucose, Bld: 103 mg/dL — ABNORMAL HIGH (ref 70–99)
Phosphorus: 5.6 mg/dL — ABNORMAL HIGH (ref 2.3–4.6)
Potassium: 4.5 mEq/L (ref 3.5–5.1)
Sodium: 140 mEq/L (ref 135–145)

## 2012-08-30 LAB — PROTIME-INR: INR: 1.2 (ref 0.00–1.49)

## 2012-08-31 LAB — MAGNESIUM: Magnesium: 1.7 mg/dL (ref 1.5–2.5)

## 2012-08-31 LAB — CBC WITH DIFFERENTIAL/PLATELET
Basophils Absolute: 0 10*3/uL (ref 0.0–0.1)
Basophils Relative: 0 % (ref 0–1)
Eosinophils Absolute: 0 10*3/uL (ref 0.0–0.7)
Eosinophils Relative: 0 % (ref 0–5)
MCH: 31.5 pg (ref 26.0–34.0)
MCHC: 32.7 g/dL (ref 30.0–36.0)
MCV: 96.3 fL (ref 78.0–100.0)
Platelets: 130 10*3/uL — ABNORMAL LOW (ref 150–400)
RDW: 18.4 % — ABNORMAL HIGH (ref 11.5–15.5)
WBC: 12.7 10*3/uL — ABNORMAL HIGH (ref 4.0–10.5)

## 2012-08-31 LAB — PHOSPHORUS: Phosphorus: 5.5 mg/dL — ABNORMAL HIGH (ref 2.3–4.6)

## 2012-08-31 LAB — BASIC METABOLIC PANEL
Calcium: 7.6 mg/dL — ABNORMAL LOW (ref 8.4–10.5)
Creatinine, Ser: 2.77 mg/dL — ABNORMAL HIGH (ref 0.50–1.35)
GFR calc non Af Amer: 22 mL/min — ABNORMAL LOW (ref >90)
Sodium: 138 mEq/L (ref 135–145)

## 2012-09-01 LAB — TYPE AND SCREEN
ABO/RH(D): A POS
Antibody Screen: NEGATIVE
Unit division: 0
Unit division: 0

## 2012-09-01 LAB — CBC WITH DIFFERENTIAL/PLATELET
Basophils Absolute: 0 10*3/uL (ref 0.0–0.1)
Basophils Relative: 0 % (ref 0–1)
Eosinophils Relative: 0 % (ref 0–5)
Hemoglobin: 8.4 g/dL — ABNORMAL LOW (ref 13.0–17.0)
MCH: 30.8 pg (ref 26.0–34.0)
Monocytes Absolute: 0.6 10*3/uL (ref 0.1–1.0)
Neutrophils Relative %: 93 % — ABNORMAL HIGH (ref 43–77)
Platelets: 136 10*3/uL — ABNORMAL LOW (ref 150–400)
RBC: 2.73 MIL/uL — ABNORMAL LOW (ref 4.22–5.81)
WBC: 12.8 10*3/uL — ABNORMAL HIGH (ref 4.0–10.5)

## 2012-09-01 LAB — RENAL FUNCTION PANEL
BUN: 84 mg/dL — ABNORMAL HIGH (ref 6–23)
CO2: 25 mEq/L (ref 19–32)
Chloride: 100 mEq/L (ref 96–112)
Creatinine, Ser: 3.02 mg/dL — ABNORMAL HIGH (ref 0.50–1.35)
GFR calc non Af Amer: 20 mL/min — ABNORMAL LOW (ref >90)
Potassium: 4.4 mEq/L (ref 3.5–5.1)

## 2012-09-01 LAB — PROTIME-INR: INR: 1.29 (ref 0.00–1.49)

## 2012-09-02 LAB — BASIC METABOLIC PANEL
Calcium: 7.7 mg/dL — ABNORMAL LOW (ref 8.4–10.5)
GFR calc non Af Amer: 23 mL/min — ABNORMAL LOW (ref >90)
Sodium: 137 mEq/L (ref 135–145)

## 2012-09-02 LAB — CBC WITH DIFFERENTIAL/PLATELET
Basophils Absolute: 0 10*3/uL (ref 0.0–0.1)
Basophils Relative: 0 % (ref 0–1)
Eosinophils Absolute: 0 10*3/uL (ref 0.0–0.7)
Eosinophils Relative: 0 % (ref 0–5)
MCH: 31.2 pg (ref 26.0–34.0)
MCV: 97.7 fL (ref 78.0–100.0)
Platelets: 110 10*3/uL — ABNORMAL LOW (ref 150–400)
RDW: 18.2 % — ABNORMAL HIGH (ref 11.5–15.5)

## 2012-09-03 LAB — PROTIME-INR
INR: 1.15 (ref 0.00–1.49)
Prothrombin Time: 14.5 seconds (ref 11.6–15.2)

## 2012-09-04 ENCOUNTER — Other Ambulatory Visit (HOSPITAL_COMMUNITY): Payer: Self-pay

## 2012-09-04 LAB — CBC WITH DIFFERENTIAL/PLATELET
Basophils Absolute: 0 10*3/uL (ref 0.0–0.1)
Eosinophils Relative: 1 % (ref 0–5)
HCT: 27.7 % — ABNORMAL LOW (ref 39.0–52.0)
Lymphocytes Relative: 4 % — ABNORMAL LOW (ref 12–46)
MCHC: 31 g/dL (ref 30.0–36.0)
MCV: 98.2 fL (ref 78.0–100.0)
Monocytes Absolute: 0.4 10*3/uL (ref 0.1–1.0)
RDW: 18 % — ABNORMAL HIGH (ref 11.5–15.5)
WBC: 9.9 10*3/uL (ref 4.0–10.5)

## 2012-09-04 LAB — PREALBUMIN: Prealbumin: 26.8 mg/dL (ref 17.0–34.0)

## 2012-09-04 LAB — COMPREHENSIVE METABOLIC PANEL
AST: 8 U/L (ref 0–37)
BUN: 87 mg/dL — ABNORMAL HIGH (ref 6–23)
CO2: 25 mEq/L (ref 19–32)
Calcium: 8 mg/dL — ABNORMAL LOW (ref 8.4–10.5)
Creatinine, Ser: 3.14 mg/dL — ABNORMAL HIGH (ref 0.50–1.35)
GFR calc Af Amer: 22 mL/min — ABNORMAL LOW (ref >90)
GFR calc non Af Amer: 19 mL/min — ABNORMAL LOW (ref >90)
Glucose, Bld: 91 mg/dL (ref 70–99)

## 2012-09-04 LAB — RENAL FUNCTION PANEL
Albumin: 1.7 g/dL — ABNORMAL LOW (ref 3.5–5.2)
CO2: 26 mEq/L (ref 19–32)
Chloride: 102 mEq/L (ref 96–112)
GFR calc Af Amer: 24 mL/min — ABNORMAL LOW (ref >90)
GFR calc non Af Amer: 20 mL/min — ABNORMAL LOW (ref >90)
Potassium: 4.1 mEq/L (ref 3.5–5.1)
Sodium: 141 mEq/L (ref 135–145)

## 2012-09-04 LAB — CBC
Platelets: 89 10*3/uL — ABNORMAL LOW (ref 150–400)
RBC: 2.54 MIL/uL — ABNORMAL LOW (ref 4.22–5.81)
WBC: 8.2 10*3/uL (ref 4.0–10.5)

## 2012-09-05 ENCOUNTER — Other Ambulatory Visit (HOSPITAL_COMMUNITY): Payer: Self-pay

## 2012-09-05 LAB — PROTIME-INR: INR: 1.14 (ref 0.00–1.49)

## 2012-09-05 LAB — HEMOGLOBIN AND HEMATOCRIT, BLOOD: Hemoglobin: 8.3 g/dL — ABNORMAL LOW (ref 13.0–17.0)

## 2012-09-06 ENCOUNTER — Other Ambulatory Visit (HOSPITAL_COMMUNITY): Payer: Self-pay

## 2012-09-06 LAB — CBC WITH DIFFERENTIAL/PLATELET
Basophils Absolute: 0 10*3/uL (ref 0.0–0.1)
Basophils Relative: 0 % (ref 0–1)
Eosinophils Relative: 1 % (ref 0–5)
HCT: 26.6 % — ABNORMAL LOW (ref 39.0–52.0)
Hemoglobin: 8.4 g/dL — ABNORMAL LOW (ref 13.0–17.0)
Lymphocytes Relative: 4 % — ABNORMAL LOW (ref 12–46)
MCHC: 31.6 g/dL (ref 30.0–36.0)
MCV: 99.3 fL (ref 78.0–100.0)
Monocytes Absolute: 0.3 10*3/uL (ref 0.1–1.0)
Monocytes Relative: 4 % (ref 3–12)
RDW: 17.9 % — ABNORMAL HIGH (ref 11.5–15.5)

## 2012-09-06 LAB — RENAL FUNCTION PANEL
BUN: 73 mg/dL — ABNORMAL HIGH (ref 6–23)
CO2: 26 mEq/L (ref 19–32)
Calcium: 7.8 mg/dL — ABNORMAL LOW (ref 8.4–10.5)
Chloride: 102 mEq/L (ref 96–112)
Creatinine, Ser: 2.86 mg/dL — ABNORMAL HIGH (ref 0.50–1.35)
GFR calc non Af Amer: 21 mL/min — ABNORMAL LOW (ref >90)

## 2012-09-07 ENCOUNTER — Other Ambulatory Visit (HOSPITAL_COMMUNITY): Payer: Self-pay

## 2012-09-07 LAB — CBC WITH DIFFERENTIAL/PLATELET
Basophils Absolute: 0 10*3/uL (ref 0.0–0.1)
Basophils Relative: 0 % (ref 0–1)
Eosinophils Absolute: 0.1 10*3/uL (ref 0.0–0.7)
Eosinophils Relative: 1 % (ref 0–5)
MCH: 31.4 pg (ref 26.0–34.0)
MCHC: 31.7 g/dL (ref 30.0–36.0)
MCV: 98.9 fL (ref 78.0–100.0)
Platelets: 90 10*3/uL — ABNORMAL LOW (ref 150–400)
RDW: 17.8 % — ABNORMAL HIGH (ref 11.5–15.5)
WBC: 8.4 10*3/uL (ref 4.0–10.5)

## 2012-09-07 LAB — BASIC METABOLIC PANEL
Calcium: 7.9 mg/dL — ABNORMAL LOW (ref 8.4–10.5)
GFR calc non Af Amer: 29 mL/min — ABNORMAL LOW (ref >90)
Sodium: 140 mEq/L (ref 135–145)

## 2012-09-08 LAB — RENAL FUNCTION PANEL
Albumin: 2.1 g/dL — ABNORMAL LOW (ref 3.5–5.2)
Calcium: 7.5 mg/dL — ABNORMAL LOW (ref 8.4–10.5)
GFR calc Af Amer: 28 mL/min — ABNORMAL LOW (ref >90)
Phosphorus: 4.6 mg/dL (ref 2.3–4.6)
Potassium: 4.4 mEq/L (ref 3.5–5.1)
Sodium: 141 mEq/L (ref 135–145)

## 2012-09-08 LAB — CBC
MCHC: 31.2 g/dL (ref 30.0–36.0)
MCV: 98.9 fL (ref 78.0–100.0)
Platelets: 84 10*3/uL — ABNORMAL LOW (ref 150–400)
RDW: 17.5 % — ABNORMAL HIGH (ref 11.5–15.5)
WBC: 6.3 10*3/uL (ref 4.0–10.5)

## 2012-09-08 LAB — PROTIME-INR: INR: 1.12 (ref 0.00–1.49)

## 2012-09-09 LAB — BASIC METABOLIC PANEL
BUN: 44 mg/dL — ABNORMAL HIGH (ref 6–23)
CO2: 29 mEq/L (ref 19–32)
Calcium: 8 mg/dL — ABNORMAL LOW (ref 8.4–10.5)
Chloride: 99 mEq/L (ref 96–112)
Creatinine, Ser: 2.2 mg/dL — ABNORMAL HIGH (ref 0.50–1.35)
Glucose, Bld: 85 mg/dL (ref 70–99)

## 2012-09-09 LAB — CBC
HCT: 26.6 % — ABNORMAL LOW (ref 39.0–52.0)
MCH: 31.4 pg (ref 26.0–34.0)
MCV: 98.2 fL (ref 78.0–100.0)
Platelets: 92 10*3/uL — ABNORMAL LOW (ref 150–400)
RBC: 2.71 MIL/uL — ABNORMAL LOW (ref 4.22–5.81)

## 2012-09-09 LAB — PROTIME-INR: Prothrombin Time: 14.1 seconds (ref 11.6–15.2)

## 2012-09-09 LAB — SEDIMENTATION RATE: Sed Rate: 28 mm/hr — ABNORMAL HIGH (ref 0–16)

## 2012-09-10 LAB — PROTIME-INR: INR: 1.02 (ref 0.00–1.49)

## 2012-09-11 LAB — BASIC METABOLIC PANEL
BUN: 63 mg/dL — ABNORMAL HIGH (ref 6–23)
Chloride: 99 mEq/L (ref 96–112)
GFR calc non Af Amer: 23 mL/min — ABNORMAL LOW (ref >90)
Glucose, Bld: 105 mg/dL — ABNORMAL HIGH (ref 70–99)
Potassium: 4.2 mEq/L (ref 3.5–5.1)
Sodium: 138 mEq/L (ref 135–145)

## 2012-09-11 LAB — CBC WITH DIFFERENTIAL/PLATELET
Eosinophils Absolute: 0 10*3/uL (ref 0.0–0.7)
Hemoglobin: 8.2 g/dL — ABNORMAL LOW (ref 13.0–17.0)
Lymphocytes Relative: 3 % — ABNORMAL LOW (ref 12–46)
Lymphs Abs: 0.2 10*3/uL — ABNORMAL LOW (ref 0.7–4.0)
Monocytes Relative: 7 % (ref 3–12)
Neutro Abs: 4.8 10*3/uL (ref 1.7–7.7)
Neutrophils Relative %: 89 % — ABNORMAL HIGH (ref 43–77)
Platelets: 93 10*3/uL — ABNORMAL LOW (ref 150–400)
RBC: 2.61 MIL/uL — ABNORMAL LOW (ref 4.22–5.81)
WBC: 5.4 10*3/uL (ref 4.0–10.5)

## 2012-09-11 LAB — PROTIME-INR
INR: 1.07 (ref 0.00–1.49)
Prothrombin Time: 13.7 seconds (ref 11.6–15.2)

## 2012-09-12 LAB — PROTIME-INR: INR: 1.11 (ref 0.00–1.49)

## 2012-09-13 ENCOUNTER — Other Ambulatory Visit (HOSPITAL_COMMUNITY): Payer: Medicare Other

## 2012-09-13 LAB — CBC WITH DIFFERENTIAL/PLATELET
Basophils Absolute: 0 10*3/uL (ref 0.0–0.1)
Basophils Relative: 0 % (ref 0–1)
Eosinophils Relative: 1 % (ref 0–5)
HCT: 26.4 % — ABNORMAL LOW (ref 39.0–52.0)
Hemoglobin: 8.4 g/dL — ABNORMAL LOW (ref 13.0–17.0)
MCH: 30.9 pg (ref 26.0–34.0)
MCHC: 31.8 g/dL (ref 30.0–36.0)
MCV: 97.1 fL (ref 78.0–100.0)
Monocytes Absolute: 0.4 10*3/uL (ref 0.1–1.0)
Monocytes Relative: 8 % (ref 3–12)
Neutro Abs: 4.3 10*3/uL (ref 1.7–7.7)
RDW: 17.4 % — ABNORMAL HIGH (ref 11.5–15.5)

## 2012-09-13 LAB — RENAL FUNCTION PANEL
CO2: 31 mEq/L (ref 19–32)
Calcium: 8.1 mg/dL — ABNORMAL LOW (ref 8.4–10.5)
GFR calc Af Amer: 29 mL/min — ABNORMAL LOW (ref >90)
GFR calc non Af Amer: 25 mL/min — ABNORMAL LOW (ref >90)
Glucose, Bld: 91 mg/dL (ref 70–99)
Phosphorus: 4.1 mg/dL (ref 2.3–4.6)
Potassium: 4 mEq/L (ref 3.5–5.1)
Sodium: 140 mEq/L (ref 135–145)

## 2012-09-13 LAB — DIGOXIN LEVEL: Digoxin Level: 1.9 ng/mL (ref 0.8–2.0)

## 2012-09-14 ENCOUNTER — Inpatient Hospital Stay (HOSPITAL_COMMUNITY): Payer: Medicare Other

## 2012-09-15 ENCOUNTER — Inpatient Hospital Stay (HOSPITAL_COMMUNITY): Payer: Medicare Other

## 2012-09-15 LAB — CBC
HCT: 26.4 % — ABNORMAL LOW (ref 39.0–52.0)
Hemoglobin: 8.2 g/dL — ABNORMAL LOW (ref 13.0–17.0)
MCHC: 31.1 g/dL (ref 30.0–36.0)
RDW: 17.5 % — ABNORMAL HIGH (ref 11.5–15.5)
WBC: 4.6 10*3/uL (ref 4.0–10.5)

## 2012-09-15 LAB — RENAL FUNCTION PANEL
Albumin: 2.3 g/dL — ABNORMAL LOW (ref 3.5–5.2)
BUN: 50 mg/dL — ABNORMAL HIGH (ref 6–23)
Chloride: 98 mEq/L (ref 96–112)
GFR calc Af Amer: 29 mL/min — ABNORMAL LOW (ref >90)
Glucose, Bld: 87 mg/dL (ref 70–99)
Phosphorus: 3.9 mg/dL (ref 2.3–4.6)
Potassium: 4.1 mEq/L (ref 3.5–5.1)
Sodium: 138 mEq/L (ref 135–145)

## 2012-09-15 LAB — PROTIME-INR
INR: 1.06 (ref 0.00–1.49)
Prothrombin Time: 13.6 seconds (ref 11.6–15.2)

## 2012-09-16 LAB — PROTIME-INR
INR: 1.08 (ref 0.00–1.49)
Prothrombin Time: 13.8 seconds (ref 11.6–15.2)

## 2012-09-17 LAB — BASIC METABOLIC PANEL
BUN: 54 mg/dL — ABNORMAL HIGH (ref 6–23)
CO2: 31 mEq/L (ref 19–32)
Calcium: 8.5 mg/dL (ref 8.4–10.5)
Chloride: 96 mEq/L (ref 96–112)
Creatinine, Ser: 2.57 mg/dL — ABNORMAL HIGH (ref 0.50–1.35)

## 2012-09-18 ENCOUNTER — Ambulatory Visit (HOSPITAL_COMMUNITY): Payer: Medicare Other

## 2012-09-18 LAB — COMPREHENSIVE METABOLIC PANEL
Alkaline Phosphatase: 83 U/L (ref 39–117)
BUN: 66 mg/dL — ABNORMAL HIGH (ref 6–23)
Calcium: 8.5 mg/dL (ref 8.4–10.5)
Creatinine, Ser: 2.91 mg/dL — ABNORMAL HIGH (ref 0.50–1.35)
GFR calc Af Amer: 24 mL/min — ABNORMAL LOW (ref >90)
Glucose, Bld: 92 mg/dL (ref 70–99)
Potassium: 4.6 mEq/L (ref 3.5–5.1)
Total Protein: 5.1 g/dL — ABNORMAL LOW (ref 6.0–8.3)

## 2012-09-18 LAB — CBC WITH DIFFERENTIAL/PLATELET
Eosinophils Absolute: 0.1 10*3/uL (ref 0.0–0.7)
Eosinophils Relative: 1 % (ref 0–5)
HCT: 27.4 % — ABNORMAL LOW (ref 39.0–52.0)
Hemoglobin: 8.6 g/dL — ABNORMAL LOW (ref 13.0–17.0)
Lymphs Abs: 0.4 10*3/uL — ABNORMAL LOW (ref 0.7–4.0)
MCH: 30.5 pg (ref 26.0–34.0)
MCHC: 31.4 g/dL (ref 30.0–36.0)
MCV: 97.2 fL (ref 78.0–100.0)
Monocytes Absolute: 0.5 10*3/uL (ref 0.1–1.0)
Monocytes Relative: 10 % (ref 3–12)
RBC: 2.82 MIL/uL — ABNORMAL LOW (ref 4.22–5.81)

## 2012-09-18 LAB — SEDIMENTATION RATE: Sed Rate: 17 mm/hr — ABNORMAL HIGH (ref 0–16)

## 2012-09-18 LAB — PREALBUMIN: Prealbumin: 26.1 mg/dL (ref 17.0–34.0)

## 2012-09-19 LAB — RENAL FUNCTION PANEL
Albumin: 2.8 g/dL — ABNORMAL LOW (ref 3.5–5.2)
Chloride: 97 mEq/L (ref 96–112)
GFR calc Af Amer: 30 mL/min — ABNORMAL LOW (ref >90)
GFR calc non Af Amer: 26 mL/min — ABNORMAL LOW (ref >90)
Potassium: 4.3 mEq/L (ref 3.5–5.1)
Sodium: 138 mEq/L (ref 135–145)

## 2012-09-19 LAB — URINE MICROSCOPIC-ADD ON

## 2012-09-19 LAB — CBC
Platelets: 162 10*3/uL (ref 150–400)
RBC: 2.89 MIL/uL — ABNORMAL LOW (ref 4.22–5.81)
RDW: 17.2 % — ABNORMAL HIGH (ref 11.5–15.5)
WBC: 3.6 10*3/uL — ABNORMAL LOW (ref 4.0–10.5)

## 2012-09-19 LAB — URINALYSIS, ROUTINE W REFLEX MICROSCOPIC
Glucose, UA: NEGATIVE mg/dL
Specific Gravity, Urine: 1.015 (ref 1.005–1.030)
Urobilinogen, UA: 0.2 mg/dL (ref 0.0–1.0)
pH: 6 (ref 5.0–8.0)

## 2012-09-19 LAB — PROTIME-INR: Prothrombin Time: 14.1 seconds (ref 11.6–15.2)

## 2012-09-20 LAB — RENAL FUNCTION PANEL
BUN: 46 mg/dL — ABNORMAL HIGH (ref 6–23)
Glucose, Bld: 94 mg/dL (ref 70–99)
Phosphorus: 3.6 mg/dL (ref 2.3–4.6)
Potassium: 4.3 mEq/L (ref 3.5–5.1)

## 2012-09-20 LAB — CBC WITH DIFFERENTIAL/PLATELET
Eosinophils Relative: 1 % (ref 0–5)
HCT: 27.8 % — ABNORMAL LOW (ref 39.0–52.0)
Lymphocytes Relative: 8 % — ABNORMAL LOW (ref 12–46)
Lymphs Abs: 0.3 10*3/uL — ABNORMAL LOW (ref 0.7–4.0)
MCV: 96.9 fL (ref 78.0–100.0)
Monocytes Absolute: 0.4 10*3/uL (ref 0.1–1.0)
Monocytes Relative: 9 % (ref 3–12)
RBC: 2.87 MIL/uL — ABNORMAL LOW (ref 4.22–5.81)
WBC: 4.3 10*3/uL (ref 4.0–10.5)

## 2012-09-20 LAB — PROTIME-INR: Prothrombin Time: 13.4 seconds (ref 11.6–15.2)

## 2012-09-22 LAB — URINE CULTURE: Colony Count: >=100000

## 2012-09-22 LAB — RENAL FUNCTION PANEL
Albumin: 3.1 g/dL — ABNORMAL LOW (ref 3.5–5.2)
BUN: 20 mg/dL (ref 6–23)
Chloride: 98 mEq/L (ref 96–112)
GFR calc Af Amer: 74 mL/min — ABNORMAL LOW (ref >90)
Glucose, Bld: 90 mg/dL (ref 70–99)
Potassium: 3.5 mEq/L (ref 3.5–5.1)

## 2012-09-22 LAB — CBC
HCT: 28 % — ABNORMAL LOW (ref 39.0–52.0)
Hemoglobin: 8.9 g/dL — ABNORMAL LOW (ref 13.0–17.0)
WBC: 5.6 10*3/uL (ref 4.0–10.5)

## 2012-09-22 LAB — PROTIME-INR: INR: 1.05 (ref 0.00–1.49)

## 2012-09-23 LAB — RENAL FUNCTION PANEL
CO2: 32 mEq/L (ref 19–32)
GFR calc Af Amer: 42 mL/min — ABNORMAL LOW (ref >90)
GFR calc non Af Amer: 37 mL/min — ABNORMAL LOW (ref >90)
Glucose, Bld: 84 mg/dL (ref 70–99)
Phosphorus: 3.1 mg/dL (ref 2.3–4.6)
Potassium: 3.8 mEq/L (ref 3.5–5.1)
Sodium: 140 mEq/L (ref 135–145)

## 2012-09-23 LAB — CBC
HCT: 27.8 % — ABNORMAL LOW (ref 39.0–52.0)
RDW: 17.2 % — ABNORMAL HIGH (ref 11.5–15.5)
WBC: 4.9 10*3/uL (ref 4.0–10.5)

## 2012-09-23 LAB — PROTIME-INR
INR: 1.06 (ref 0.00–1.49)
Prothrombin Time: 13.6 seconds (ref 11.6–15.2)

## 2012-09-24 LAB — PROTIME-INR: INR: 1.06 (ref 0.00–1.49)

## 2012-09-25 LAB — SEDIMENTATION RATE: Sed Rate: 10 mm/hr (ref 0–16)

## 2012-09-25 LAB — CBC WITH DIFFERENTIAL/PLATELET
Lymphocytes Relative: 8 % — ABNORMAL LOW (ref 12–46)
Lymphs Abs: 0.5 10*3/uL — ABNORMAL LOW (ref 0.7–4.0)
Neutro Abs: 4.7 10*3/uL (ref 1.7–7.7)
Neutrophils Relative %: 87 % — ABNORMAL HIGH (ref 43–77)
Platelets: 137 10*3/uL — ABNORMAL LOW (ref 150–400)
RBC: 2.95 MIL/uL — ABNORMAL LOW (ref 4.22–5.81)
WBC: 5.4 10*3/uL (ref 4.0–10.5)

## 2012-09-25 LAB — RENAL FUNCTION PANEL
CO2: 29 mEq/L (ref 19–32)
Calcium: 8.5 mg/dL (ref 8.4–10.5)
Chloride: 97 mEq/L (ref 96–112)
GFR calc Af Amer: 27 mL/min — ABNORMAL LOW (ref >90)
GFR calc non Af Amer: 24 mL/min — ABNORMAL LOW (ref >90)
Potassium: 4.5 mEq/L (ref 3.5–5.1)
Sodium: 136 mEq/L (ref 135–145)

## 2012-09-25 LAB — PROTIME-INR: Prothrombin Time: 13.7 seconds (ref 11.6–15.2)

## 2012-09-26 LAB — BASIC METABOLIC PANEL
Calcium: 8.7 mg/dL (ref 8.4–10.5)
GFR calc non Af Amer: 21 mL/min — ABNORMAL LOW (ref >90)
Glucose, Bld: 97 mg/dL (ref 70–99)
Sodium: 138 mEq/L (ref 135–145)

## 2012-09-26 LAB — PROTIME-INR: INR: 1.02 (ref 0.00–1.49)

## 2012-09-26 NOTE — Consult Note (Signed)
Reason for Consult: Inflammation right olecranon bursa Referring Physician: Dr Judee Clara is an 67 y.o. male.  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  Past Medical History  Diagnosis Date  . Arthritis   . Hypertension   . DJD (degenerative joint disease)   . DJD (degenerative joint disease), ankle and foot   . Arthritis   . Cellulitis   . Arthritis   . Chronic back pain   . Gout   . Dysrhythmia     afibrillation  . Atrial fibrillation   . Chronic kidney disease   . Colon cancer     2005  . 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 Charcot ankle     dr. Emilio Math    Past Surgical History  Procedure Laterality Date  . Foot surgery      right foot-  . Colon surgery      forcolon cancer, 2005, Dr. Marcell Anger in [place]  . Tonsillectomy    . Cystoscopy with urethral dilatation    . Back surgery      x2  . Cataract extraction w/phaco  01/14/2011    Procedure: CATARACT EXTRACTION PHACO AND INTRAOCULAR LENS PLACEMENT (IOC);  Surgeon: Gemma Payor;  Location: [facility];  Service: Ophthalmology;  Laterality: Left;  CDE=16.27  . Lymph node biopsy Right 07/11/2012    Procedure: CERVICAL LYMPH NODE BIOPSY;  Surgeon: Marlane Hatcher, MD;  Location: [facility];  Service: General;  Laterality: Right;  end @ 1101  . Portacath placement Right 07/11/2012    Procedure: INSERTION PORT-A-CATH;  Surgeon: Marlane Hatcher, MD;  Location: [facility];  Service: General;  Laterality: Right;  . Port-a-cath removal Right 08/05/2012    Procedure: REMOVAL PORT-A-CATH;  Surgeon: Shelly Rubenstein, MD;  Location: [facility];  Service: General;  Laterality: Right;    Family History  Problem Relation Age of Onset  . Anesthesia problems Neg Hx   . Hypotension Neg Hx   . Malignant hyperthermia Neg Hx   .  Pseudochol deficiency Neg Hx   . Breast cancer Mother   . Heart disease Mother     Social History:  reports that he has never smoked. He has never used smokeless tobacco. He reports that he does not drink alcohol or use illicit drugs.  Allergies:  Allergies  Allergen Reactions  . Ace Inhibitors Other (See Comments)    cough  . Rocephin [Ceftriaxone Sodium] Hives  . Beta Adrenergic Blockers Rash    Medications: I have reviewed the patient's current medications.  Results for orders placed during the hospital encounter of 08/17/12 (from the past 48 hour(s))  PROTIME-INR     Status: None   Collection Time    09/25/12  5:00 AM      Result Value Range   Prothrombin Time 13.7  11.6 - 15.2 seconds   INR 1.07  0.00 - 1.49  CBC WITH DIFFERENTIAL     Status: Abnormal   Collection Time    09/25/12  5:00 AM      Result Value Range   WBC 5.4  4.0 - 10.5 K/uL   RBC 2.95 (*) 4.22 - 5.81 MIL/uL   Hemoglobin 8.8 (*) 13.0 - 17.0 g/dL   HCT 16.1 (*) 09.6 - 04.5 %   MCV 96.3  78.0 - 100.0 fL   MCH 29.8  26.0 - 34.0 pg   MCHC 31.0  30.0 - 36.0 g/dL   RDW 40.9 (*) 81.1 - 91.4 %   Platelets 137 (*) 150 - 400 K/uL   Neutrophils Relative % 87 (*) 43 - 77 %   Neutro Abs 4.7  1.7 - 7.7 K/uL   Lymphocytes Relative 8 (*) 12 - 46 %   Lymphs Abs 0.5 (*) 0.7 - 4.0 K/uL   Monocytes Relative 4  3 - 12 %   Monocytes Absolute 0.2  0.1 - 1.0 K/uL   Eosinophils Relative 1  0 - 5 %   Eosinophils Absolute 0.0  0.0 - 0.7 K/uL   Basophils Relative 0  0 - 1 %   Basophils Absolute 0.0  0.0 - 0.1 K/uL  SEDIMENTATION RATE     Status: None   Collection Time    09/25/12  5:00 AM      Result Value Range   Sed Rate 10  0 - 16 mm/hr  RENAL FUNCTION PANEL     Status: Abnormal   Collection Time    09/25/12 11:02 AM      Result Value Range   Sodium 136  135 - 145 mEq/L   Potassium 4.5  3.5 - 5.1 mEq/L   Chloride 97  96 - 112 mEq/L   CO2 29  19 - 32 mEq/L   Glucose, Bld 114 (*) 70 - 99 mg/dL   BUN 69 (*) 6 -  23 mg/dL   Creatinine, Ser 7.82 (*) 0.50 - 1.35 mg/dL   Calcium 8.5  8.4 - 95.6 mg/dL   Phosphorus 4.4  2.3 - 4.6 mg/dL   Albumin 2.8 (*) 3.5 - 5.2 g/dL   GFR calc non Af Amer 24 (*) >90 mL/min   GFR calc Af Amer 27 (*) >90 mL/min   Comment: (NOTE)     The eGFR has been calculated using the CKD EPI equation.     This calculation has not been validated in all clinical situations.     eGFR's persistently <90 mL/min signify possible Chronic Kidney     Disease.  PROTIME-INR     Status: None   Collection Time    09/26/12  5:00 AM      Result Value Range   Prothrombin Time 13.2  11.6 - 15.2 seconds   INR 1.02  0.00 - 1.49  BASIC METABOLIC PANEL     Status: Abnormal   Collection Time    09/26/12  5:00 AM      Result Value Range   Sodium 138  135 - 145 mEq/L   Potassium 4.6  3.5 - 5.1 mEq/L   Chloride 96  96 - 112 mEq/L   CO2 27  19 - 32 mEq/L   Glucose, Bld 97  70 - 99 mg/dL   BUN 77 (*) 6 - 23 mg/dL   Creatinine, Ser 2.13 (*) 0.50 - 1.35 mg/dL   Calcium 8.7  8.4 - 08.6 mg/dL   GFR calc non Af Amer 21 (*) >90 mL/min   GFR calc Af Amer 25 (*) >90 mL/min   Comment: (NOTE)     The eGFR has been calculated using the CKD EPI equation.     This calculation has not been validated in all clinical situations.     eGFR's persistently <90 mL/min signify possible Chronic Kidney     Disease.    No results found.  Review of Systems  All  other systems reviewed and are negative.   There were no vitals taken for this visit. Physical Exam On examination patient has chronic swelling of the left olecranon bursa there is no redness no tenderness no signs of infection. Examination of right elbow he has acute redness there is no swelling but he does have small tophaceous areas consistent with acute gout right olecranon bursa. Assessment/Plan: Assessment: Acute gout right olecranon bursa. No sign of infection.  Plan: I will order uric acid level. Patient is currently on allopurinol 100 mg daily.  This is most likely the highest does do to his renal insufficiency. If his uric acid is greater than 6.0 recommend adding: Chris 0.6 mg daily. I will followup as needed.  Tiernan Millikin V 09/26/2012, 7:41 PM

## 2012-09-27 LAB — CBC
HCT: 29 % — ABNORMAL LOW (ref 39.0–52.0)
Hemoglobin: 9 g/dL — ABNORMAL LOW (ref 13.0–17.0)
MCV: 95.7 fL (ref 78.0–100.0)
RBC: 3.03 MIL/uL — ABNORMAL LOW (ref 4.22–5.81)
WBC: 4.9 10*3/uL (ref 4.0–10.5)

## 2012-09-27 LAB — RENAL FUNCTION PANEL
BUN: 85 mg/dL — ABNORMAL HIGH (ref 6–23)
Glucose, Bld: 101 mg/dL — ABNORMAL HIGH (ref 70–99)
Phosphorus: 5.1 mg/dL — ABNORMAL HIGH (ref 2.3–4.6)
Potassium: 4.8 mEq/L (ref 3.5–5.1)

## 2012-09-27 LAB — URIC ACID: Uric Acid, Serum: 6.4 mg/dL (ref 4.0–7.8)

## 2012-09-27 LAB — CREATININE, URINE, 24 HOUR: Collection Interval-UCRE24: 24 hours

## 2012-09-28 LAB — RENAL FUNCTION PANEL
BUN: 93 mg/dL — ABNORMAL HIGH (ref 6–23)
Calcium: 8.3 mg/dL — ABNORMAL LOW (ref 8.4–10.5)
Glucose, Bld: 97 mg/dL (ref 70–99)
Phosphorus: 5 mg/dL — ABNORMAL HIGH (ref 2.3–4.6)

## 2012-09-28 LAB — CREATININE, URINE, 24 HOUR
Collection Interval-UCRE24: 24 hours
Urine Total Volume-UCRE24: 1800 mL

## 2012-09-28 LAB — PROTIME-INR
INR: 1.11 (ref 0.00–1.49)
Prothrombin Time: 14.1 seconds (ref 11.6–15.2)

## 2012-09-29 LAB — RENAL FUNCTION PANEL
Albumin: 3 g/dL — ABNORMAL LOW (ref 3.5–5.2)
BUN: 103 mg/dL — ABNORMAL HIGH (ref 6–23)
Chloride: 102 mEq/L (ref 96–112)
GFR calc Af Amer: 24 mL/min — ABNORMAL LOW (ref >90)
Glucose, Bld: 107 mg/dL — ABNORMAL HIGH (ref 70–99)
Potassium: 5.1 mEq/L (ref 3.5–5.1)
Sodium: 143 mEq/L (ref 135–145)

## 2012-09-30 LAB — PROTIME-INR
INR: 1.11 (ref 0.00–1.49)
Prothrombin Time: 14.1 seconds (ref 11.6–15.2)

## 2012-10-01 LAB — BASIC METABOLIC PANEL
BUN: 110 mg/dL — ABNORMAL HIGH (ref 6–23)
Chloride: 98 mEq/L (ref 96–112)
Creatinine, Ser: 2.92 mg/dL — ABNORMAL HIGH (ref 0.50–1.35)
GFR calc Af Amer: 24 mL/min — ABNORMAL LOW (ref >90)
Glucose, Bld: 87 mg/dL (ref 70–99)

## 2012-10-01 LAB — HEMOGLOBIN AND HEMATOCRIT, BLOOD: Hemoglobin: 8.8 g/dL — ABNORMAL LOW (ref 13.0–17.0)

## 2012-10-01 LAB — PROTIME-INR: INR: 1.06 (ref 0.00–1.49)

## 2012-10-02 ENCOUNTER — Other Ambulatory Visit (HOSPITAL_COMMUNITY): Payer: Self-pay

## 2012-10-02 LAB — BLOOD GAS, ARTERIAL
Acid-Base Excess: 6.8 mmol/L — ABNORMAL HIGH (ref 0.0–2.0)
O2 Content: 2 L/min
O2 Saturation: 96.4 %
pO2, Arterial: 85.5 mmHg (ref 80.0–100.0)

## 2012-10-02 LAB — URINALYSIS, ROUTINE W REFLEX MICROSCOPIC
Glucose, UA: NEGATIVE mg/dL
Protein, ur: NEGATIVE mg/dL
Specific Gravity, Urine: 1.009 (ref 1.005–1.030)
Urobilinogen, UA: 0.2 mg/dL (ref 0.0–1.0)

## 2012-10-02 LAB — COMPREHENSIVE METABOLIC PANEL
ALT: <5 U/L (ref 0–53)
AST: 10 U/L (ref 0–37)
Alkaline Phosphatase: 76 U/L (ref 39–117)
CO2: 31 mEq/L (ref 19–32)
Calcium: 8.7 mg/dL (ref 8.4–10.5)
Chloride: 96 mEq/L (ref 96–112)
GFR calc non Af Amer: 21 mL/min — ABNORMAL LOW (ref >90)
Glucose, Bld: 87 mg/dL (ref 70–99)
Potassium: 4.5 mEq/L (ref 3.5–5.1)
Sodium: 139 mEq/L (ref 135–145)
Total Bilirubin: 0.5 mg/dL (ref 0.3–1.2)

## 2012-10-02 LAB — CBC WITH DIFFERENTIAL/PLATELET
Basophils Absolute: 0 10*3/uL (ref 0.0–0.1)
Eosinophils Absolute: 0 10*3/uL (ref 0.0–0.7)
Hemoglobin: 9.2 g/dL — ABNORMAL LOW (ref 13.0–17.0)
Lymphocytes Relative: 8 % — ABNORMAL LOW (ref 12–46)
Lymphs Abs: 0.3 10*3/uL — ABNORMAL LOW (ref 0.7–4.0)
MCH: 29.7 pg (ref 26.0–34.0)
Monocytes Relative: 5 % (ref 3–12)
Neutro Abs: 3 10*3/uL (ref 1.7–7.7)
Neutrophils Relative %: 87 % — ABNORMAL HIGH (ref 43–77)
Platelets: 105 10*3/uL — ABNORMAL LOW (ref 150–400)
RBC: 3.1 MIL/uL — ABNORMAL LOW (ref 4.22–5.81)
RDW: 17 % — ABNORMAL HIGH (ref 11.5–15.5)
WBC: 3.4 10*3/uL — ABNORMAL LOW (ref 4.0–10.5)

## 2012-10-02 LAB — PREALBUMIN: Prealbumin: 24.7 mg/dL (ref 17.0–34.0)

## 2012-10-02 LAB — PROCALCITONIN: Procalcitonin: 0.6 ng/mL

## 2012-10-02 LAB — URINE MICROSCOPIC-ADD ON

## 2012-10-02 LAB — DIGOXIN LEVEL: Digoxin Level: 1.1 ng/mL (ref 0.8–2.0)

## 2012-10-02 LAB — SEDIMENTATION RATE: Sed Rate: 20 mm/hr — ABNORMAL HIGH (ref 0–16)

## 2012-10-02 LAB — PROTIME-INR: Prothrombin Time: 14 seconds (ref 11.6–15.2)

## 2012-10-03 ENCOUNTER — Other Ambulatory Visit (HOSPITAL_COMMUNITY): Payer: Self-pay

## 2012-10-03 LAB — CBC WITH DIFFERENTIAL/PLATELET
Basophils Absolute: 0 10*3/uL (ref 0.0–0.1)
Eosinophils Absolute: 0 10*3/uL (ref 0.0–0.7)
Eosinophils Relative: 0 % (ref 0–5)
Lymphs Abs: 0.3 10*3/uL — ABNORMAL LOW (ref 0.7–4.0)
MCH: 29.7 pg (ref 26.0–34.0)
MCHC: 31.9 g/dL (ref 30.0–36.0)
MCV: 93.1 fL (ref 78.0–100.0)
Monocytes Absolute: 0.3 10*3/uL (ref 0.1–1.0)
Neutrophils Relative %: 86 % — ABNORMAL HIGH (ref 43–77)
Platelets: 87 10*3/uL — ABNORMAL LOW (ref 150–400)
RBC: 2.9 MIL/uL — ABNORMAL LOW (ref 4.22–5.81)
RDW: 17.2 % — ABNORMAL HIGH (ref 11.5–15.5)
WBC: 4 10*3/uL (ref 4.0–10.5)

## 2012-10-03 LAB — RENAL FUNCTION PANEL
BUN: 120 mg/dL — ABNORMAL HIGH (ref 6–23)
Calcium: 8.2 mg/dL — ABNORMAL LOW (ref 8.4–10.5)
Chloride: 100 mEq/L (ref 96–112)
Glucose, Bld: 99 mg/dL (ref 70–99)
Phosphorus: 5.4 mg/dL — ABNORMAL HIGH (ref 2.3–4.6)
Potassium: 4.8 mEq/L (ref 3.5–5.1)

## 2012-10-03 LAB — PROTIME-INR: Prothrombin Time: 14.9 seconds (ref 11.6–15.2)

## 2012-10-03 NOTE — Procedures (Signed)
Successful fluoroscopic guided exchange of existing left jugular approach temporary dialysis catheter and right jugular approach triple lumen PICC.  Catheter is ready for immediate use.  No immediate post procedural complications.

## 2012-10-04 LAB — CBC WITH DIFFERENTIAL/PLATELET
Basophils Absolute: 0 10*3/uL (ref 0.0–0.1)
Basophils Relative: 0 % (ref 0–1)
Eosinophils Absolute: 0 10*3/uL (ref 0.0–0.7)
Eosinophils Relative: 0 % (ref 0–5)
HCT: 25.6 % — ABNORMAL LOW (ref 39.0–52.0)
Lymphocytes Relative: 9 % — ABNORMAL LOW (ref 12–46)
MCHC: 32.4 g/dL (ref 30.0–36.0)
MCV: 92.8 fL (ref 78.0–100.0)
Monocytes Absolute: 0.4 10*3/uL (ref 0.1–1.0)
Neutrophils Relative %: 81 % — ABNORMAL HIGH (ref 43–77)
Platelets: 87 10*3/uL — ABNORMAL LOW (ref 150–400)
RDW: 17 % — ABNORMAL HIGH (ref 11.5–15.5)
WBC: 3.9 10*3/uL — ABNORMAL LOW (ref 4.0–10.5)

## 2012-10-04 LAB — PROTIME-INR
INR: 1.11 (ref 0.00–1.49)
Prothrombin Time: 14.1 seconds (ref 11.6–15.2)

## 2012-10-04 LAB — RENAL FUNCTION PANEL
Albumin: 2.9 g/dL — ABNORMAL LOW (ref 3.5–5.2)
BUN: 101 mg/dL — ABNORMAL HIGH (ref 6–23)
Chloride: 102 mEq/L (ref 96–112)
GFR calc Af Amer: 27 mL/min — ABNORMAL LOW (ref >90)
GFR calc non Af Amer: 23 mL/min — ABNORMAL LOW (ref >90)
Phosphorus: 4.9 mg/dL — ABNORMAL HIGH (ref 2.3–4.6)
Potassium: 4.3 mEq/L (ref 3.5–5.1)
Sodium: 147 mEq/L — ABNORMAL HIGH (ref 135–145)

## 2012-10-04 LAB — URINE CULTURE

## 2012-10-04 MED FILL — Chlorhexidine Gluconate Liquid 4%: CUTANEOUS | Qty: 15 | Status: AC

## 2012-10-04 MED FILL — Heparin Sodium (Porcine) Inj 1000 Unit/ML: INTRAMUSCULAR | Qty: 10 | Status: AC

## 2012-10-05 LAB — CBC
MCH: 29.3 pg (ref 26.0–34.0)
MCHC: 31.7 g/dL (ref 30.0–36.0)
Platelets: 80 10*3/uL — ABNORMAL LOW (ref 150–400)
RBC: 2.59 MIL/uL — ABNORMAL LOW (ref 4.22–5.81)

## 2012-10-05 LAB — PROTIME-INR: Prothrombin Time: 14.6 seconds (ref 11.6–15.2)

## 2012-10-05 LAB — RENAL FUNCTION PANEL
BUN: 74 mg/dL — ABNORMAL HIGH (ref 6–23)
Calcium: 8.5 mg/dL (ref 8.4–10.5)
Creatinine, Ser: 2.2 mg/dL — ABNORMAL HIGH (ref 0.50–1.35)
GFR calc Af Amer: 34 mL/min — ABNORMAL LOW (ref >90)
GFR calc non Af Amer: 29 mL/min — ABNORMAL LOW (ref >90)
Phosphorus: 3.9 mg/dL (ref 2.3–4.6)
Sodium: 140 mEq/L (ref 135–145)

## 2012-10-05 LAB — VANCOMYCIN, RANDOM: Vancomycin Rm: 7 ug/mL

## 2012-10-06 LAB — RENAL FUNCTION PANEL
Albumin: 2.6 g/dL — ABNORMAL LOW (ref 3.5–5.2)
BUN: 52 mg/dL — ABNORMAL HIGH (ref 6–23)
Calcium: 8.4 mg/dL (ref 8.4–10.5)
Chloride: 99 mEq/L (ref 96–112)
GFR calc non Af Amer: 33 mL/min — ABNORMAL LOW (ref >90)
Glucose, Bld: 97 mg/dL (ref 70–99)
Phosphorus: 3.3 mg/dL (ref 2.3–4.6)

## 2012-10-06 LAB — CBC
HCT: 23.3 % — ABNORMAL LOW (ref 39.0–52.0)
MCHC: 31.3 g/dL (ref 30.0–36.0)
MCV: 92.8 fL (ref 78.0–100.0)
RDW: 17 % — ABNORMAL HIGH (ref 11.5–15.5)

## 2012-10-06 LAB — CULTURE, BLOOD (ROUTINE X 2)

## 2012-10-07 LAB — CBC
Hemoglobin: 7.8 g/dL — ABNORMAL LOW (ref 13.0–17.0)
MCHC: 32.1 g/dL (ref 30.0–36.0)
MCV: 91.4 fL (ref 78.0–100.0)
Platelets: 88 10*3/uL — ABNORMAL LOW (ref 150–400)
RBC: 2.66 MIL/uL — ABNORMAL LOW (ref 4.22–5.81)
RDW: 16.9 % — ABNORMAL HIGH (ref 11.5–15.5)
WBC: 3.8 10*3/uL — ABNORMAL LOW (ref 4.0–10.5)

## 2012-10-07 LAB — BASIC METABOLIC PANEL
BUN: 44 mg/dL — ABNORMAL HIGH (ref 6–23)
CO2: 32 mEq/L (ref 19–32)
Calcium: 8.3 mg/dL — ABNORMAL LOW (ref 8.4–10.5)
Creatinine, Ser: 1.98 mg/dL — ABNORMAL HIGH (ref 0.50–1.35)
GFR calc Af Amer: 38 mL/min — ABNORMAL LOW (ref >90)
GFR calc non Af Amer: 33 mL/min — ABNORMAL LOW (ref >90)
Sodium: 139 mEq/L (ref 135–145)

## 2012-10-07 LAB — CULTURE, BLOOD (ROUTINE X 2)

## 2012-10-07 LAB — PROTIME-INR: Prothrombin Time: 14.2 seconds (ref 11.6–15.2)

## 2012-10-08 LAB — CBC
Hemoglobin: 7.7 g/dL — ABNORMAL LOW (ref 13.0–17.0)
MCH: 29.7 pg (ref 26.0–34.0)
MCHC: 32.6 g/dL (ref 30.0–36.0)
RBC: 2.59 MIL/uL — ABNORMAL LOW (ref 4.22–5.81)
WBC: 4.2 10*3/uL (ref 4.0–10.5)

## 2012-10-08 LAB — PROTIME-INR
INR: 1.06 (ref 0.00–1.49)
Prothrombin Time: 13.6 seconds (ref 11.6–15.2)

## 2012-10-09 LAB — RENAL FUNCTION PANEL
CO2: 26 mEq/L (ref 19–32)
Calcium: 8.5 mg/dL (ref 8.4–10.5)
Chloride: 96 mEq/L (ref 96–112)
GFR calc Af Amer: 23 mL/min — ABNORMAL LOW (ref >90)
GFR calc non Af Amer: 20 mL/min — ABNORMAL LOW (ref >90)
Glucose, Bld: 93 mg/dL (ref 70–99)
Potassium: 3.8 mEq/L (ref 3.5–5.1)
Sodium: 138 mEq/L (ref 135–145)

## 2012-10-09 LAB — DIFFERENTIAL
Eosinophils Relative: 2 % (ref 0–5)
Lymphocytes Relative: 11 % — ABNORMAL LOW (ref 12–46)
Monocytes Absolute: 0.5 10*3/uL (ref 0.1–1.0)
Monocytes Relative: 11 % (ref 3–12)
Neutro Abs: 3.5 10*3/uL (ref 1.7–7.7)
Neutrophils Relative %: 76 % (ref 43–77)

## 2012-10-09 LAB — CBC
HCT: 23.6 % — ABNORMAL LOW (ref 39.0–52.0)
Hemoglobin: 7.7 g/dL — ABNORMAL LOW (ref 13.0–17.0)
MCHC: 32.6 g/dL (ref 30.0–36.0)
MCV: 89.7 fL (ref 78.0–100.0)
RBC: 2.63 MIL/uL — ABNORMAL LOW (ref 4.22–5.81)
WBC: 4.6 10*3/uL (ref 4.0–10.5)

## 2012-10-09 LAB — PROTIME-INR
INR: 1.05 (ref 0.00–1.49)
Prothrombin Time: 13.5 seconds (ref 11.6–15.2)

## 2012-10-09 LAB — PREPARE RBC (CROSSMATCH)

## 2012-10-10 LAB — PROTIME-INR: INR: 1.1 (ref 0.00–1.49)

## 2012-10-10 LAB — TYPE AND SCREEN
ABO/RH(D): A POS
Unit division: 0
Unit division: 0

## 2012-10-10 LAB — RENAL FUNCTION PANEL
Albumin: 2.3 g/dL — ABNORMAL LOW (ref 3.5–5.2)
BUN: 51 mg/dL — ABNORMAL HIGH (ref 6–23)
Chloride: 102 mEq/L (ref 96–112)
Creatinine, Ser: 2 mg/dL — ABNORMAL HIGH (ref 0.50–1.35)
GFR calc non Af Amer: 33 mL/min — ABNORMAL LOW (ref >90)
Phosphorus: 3 mg/dL (ref 2.3–4.6)
Potassium: 3.8 mEq/L (ref 3.5–5.1)

## 2012-10-10 LAB — CBC
MCHC: 33.1 g/dL (ref 30.0–36.0)
MCV: 88.6 fL (ref 78.0–100.0)
Platelets: 138 10*3/uL — ABNORMAL LOW (ref 150–400)
RDW: 17.2 % — ABNORMAL HIGH (ref 11.5–15.5)
WBC: 4.9 10*3/uL (ref 4.0–10.5)

## 2012-10-11 LAB — RENAL FUNCTION PANEL
BUN: 63 mg/dL — ABNORMAL HIGH (ref 6–23)
CO2: 29 mEq/L (ref 19–32)
GFR calc Af Amer: 29 mL/min — ABNORMAL LOW (ref >90)
Glucose, Bld: 99 mg/dL (ref 70–99)
Phosphorus: 3.8 mg/dL (ref 2.3–4.6)
Potassium: 3.7 mEq/L (ref 3.5–5.1)
Sodium: 139 mEq/L (ref 135–145)

## 2012-10-11 LAB — PROTIME-INR: INR: 1.1 (ref 0.00–1.49)

## 2012-10-11 LAB — HEMOGLOBIN AND HEMATOCRIT, BLOOD: Hemoglobin: 9 g/dL — ABNORMAL LOW (ref 13.0–17.0)

## 2012-10-12 LAB — PROTIME-INR
INR: 1.14 (ref 0.00–1.49)
Prothrombin Time: 14.4 seconds (ref 11.6–15.2)

## 2012-10-13 LAB — CULTURE, BLOOD (ROUTINE X 2): Culture: NO GROWTH

## 2012-10-13 LAB — VANCOMYCIN, RANDOM: Vancomycin Rm: 15.6 ug/mL

## 2012-10-13 LAB — CBC WITH DIFFERENTIAL/PLATELET
Basophils Absolute: 0 10*3/uL (ref 0.0–0.1)
Basophils Relative: 0 % (ref 0–1)
Eosinophils Absolute: 0.1 10*3/uL (ref 0.0–0.7)
Eosinophils Relative: 2 % (ref 0–5)
HCT: 25.4 % — ABNORMAL LOW (ref 39.0–52.0)
Hemoglobin: 8.3 g/dL — ABNORMAL LOW (ref 13.0–17.0)
Lymphocytes Relative: 8 % — ABNORMAL LOW (ref 12–46)
Lymphs Abs: 0.4 10*3/uL — ABNORMAL LOW (ref 0.7–4.0)
MCH: 28.9 pg (ref 26.0–34.0)
MCHC: 32.7 g/dL (ref 30.0–36.0)
Monocytes Absolute: 0.4 10*3/uL (ref 0.1–1.0)
Monocytes Relative: 8 % (ref 3–12)
Neutro Abs: 4.3 10*3/uL (ref 1.7–7.7)
Platelets: 171 10*3/uL (ref 150–400)
RDW: 16.5 % — ABNORMAL HIGH (ref 11.5–15.5)
WBC: 5.3 10*3/uL (ref 4.0–10.5)

## 2012-10-13 LAB — PROTIME-INR
INR: 1.2 (ref 0.00–1.49)
Prothrombin Time: 14.9 seconds (ref 11.6–15.2)

## 2012-10-13 LAB — RENAL FUNCTION PANEL
Albumin: 2.3 g/dL — ABNORMAL LOW (ref 3.5–5.2)
Chloride: 101 mEq/L (ref 96–112)
GFR calc non Af Amer: 24 mL/min — ABNORMAL LOW (ref >90)
Phosphorus: 4 mg/dL (ref 2.3–4.6)
Potassium: 3.5 mEq/L (ref 3.5–5.1)
Sodium: 142 mEq/L (ref 135–145)

## 2012-10-13 LAB — HEMOGLOBIN A1C: Hgb A1c MFr Bld: 5.3 % (ref <5.7)

## 2012-10-14 DIAGNOSIS — M79609 Pain in unspecified limb: Secondary | ICD-10-CM

## 2012-10-14 LAB — PROTIME-INR
INR: 1.19 (ref 0.00–1.49)
Prothrombin Time: 14.8 seconds (ref 11.6–15.2)

## 2012-10-14 LAB — CBC
HCT: 25.9 % — ABNORMAL LOW (ref 39.0–52.0)
Hemoglobin: 8.4 g/dL — ABNORMAL LOW (ref 13.0–17.0)
MCH: 28.2 pg (ref 26.0–34.0)
MCHC: 32.4 g/dL (ref 30.0–36.0)
RBC: 2.98 MIL/uL — ABNORMAL LOW (ref 4.22–5.81)
RDW: 16.3 % — ABNORMAL HIGH (ref 11.5–15.5)
WBC: 6 10*3/uL (ref 4.0–10.5)

## 2012-10-14 LAB — CULTURE, BLOOD (ROUTINE X 2): Culture: NO GROWTH

## 2012-10-14 NOTE — Progress Notes (Signed)
VASCULAR LAB PRELIMINARY  PRELIMINARY  PRELIMINARY  PRELIMINARY  Right upper extremity venous Doppler completed.    Preliminary report:  There is no DVT or SVT noted in the right upper extremity.  Anthonette Lesage, RVT 10/14/2012, 3:46 PM

## 2012-10-15 LAB — BASIC METABOLIC PANEL
BUN: 64 mg/dL — ABNORMAL HIGH (ref 6–23)
Chloride: 99 mEq/L (ref 96–112)
Creatinine, Ser: 2.51 mg/dL — ABNORMAL HIGH (ref 0.50–1.35)
Glucose, Bld: 103 mg/dL — ABNORMAL HIGH (ref 70–99)
Potassium: 4.2 mEq/L (ref 3.5–5.1)

## 2012-10-15 LAB — CBC
HCT: 26.6 % — ABNORMAL LOW (ref 39.0–52.0)
Hemoglobin: 8.4 g/dL — ABNORMAL LOW (ref 13.0–17.0)
MCH: 27.9 pg (ref 26.0–34.0)
MCHC: 31.6 g/dL (ref 30.0–36.0)
MCV: 88.4 fL (ref 78.0–100.0)
RDW: 16.4 % — ABNORMAL HIGH (ref 11.5–15.5)

## 2012-10-15 LAB — PROTIME-INR: Prothrombin Time: 14.6 seconds (ref 11.6–15.2)

## 2012-10-16 LAB — URIC ACID: Uric Acid, Serum: 6.5 mg/dL (ref 4.0–7.8)

## 2012-10-16 LAB — COMPREHENSIVE METABOLIC PANEL
ALT: 5 U/L (ref 0–53)
ALT: 5 U/L (ref 0–53)
AST: 12 U/L (ref 0–37)
Albumin: 2.3 g/dL — ABNORMAL LOW (ref 3.5–5.2)
Albumin: 2.4 g/dL — ABNORMAL LOW (ref 3.5–5.2)
Alkaline Phosphatase: 88 U/L (ref 39–117)
BUN: 75 mg/dL — ABNORMAL HIGH (ref 6–23)
CO2: 29 mEq/L (ref 19–32)
Calcium: 8.7 mg/dL (ref 8.4–10.5)
Creatinine, Ser: 2.91 mg/dL — ABNORMAL HIGH (ref 0.50–1.35)
GFR calc Af Amer: 24 mL/min — ABNORMAL LOW (ref >90)
GFR calc non Af Amer: 20 mL/min — ABNORMAL LOW (ref >90)
Glucose, Bld: 134 mg/dL — ABNORMAL HIGH (ref 70–99)
Glucose, Bld: 155 mg/dL — ABNORMAL HIGH (ref 70–99)
Sodium: 139 mEq/L (ref 135–145)
Total Bilirubin: 0.4 mg/dL (ref 0.3–1.2)
Total Protein: 5.4 g/dL — ABNORMAL LOW (ref 6.0–8.3)
Total Protein: 5.8 g/dL — ABNORMAL LOW (ref 6.0–8.3)

## 2012-10-16 LAB — CBC WITH DIFFERENTIAL/PLATELET
Basophils Absolute: 0 10*3/uL (ref 0.0–0.1)
Basophils Relative: 0 % (ref 0–1)
Eosinophils Absolute: 0 10*3/uL (ref 0.0–0.7)
HCT: 25.9 % — ABNORMAL LOW (ref 39.0–52.0)
Hemoglobin: 8.3 g/dL — ABNORMAL LOW (ref 13.0–17.0)
Lymphs Abs: 0.9 10*3/uL (ref 0.7–4.0)
MCH: 28.3 pg (ref 26.0–34.0)
MCHC: 32 g/dL (ref 30.0–36.0)
MCV: 88.4 fL (ref 78.0–100.0)
Monocytes Absolute: 0.7 10*3/uL (ref 0.1–1.0)
Monocytes Relative: 11 % (ref 3–12)
Neutrophils Relative %: 76 % (ref 43–77)
RBC: 2.93 MIL/uL — ABNORMAL LOW (ref 4.22–5.81)
RDW: 16.7 % — ABNORMAL HIGH (ref 11.5–15.5)

## 2012-10-16 LAB — URINALYSIS, ROUTINE W REFLEX MICROSCOPIC
Glucose, UA: NEGATIVE mg/dL
Hgb urine dipstick: NEGATIVE
Protein, ur: NEGATIVE mg/dL
Urobilinogen, UA: 0.2 mg/dL (ref 0.0–1.0)
pH: 5 (ref 5.0–8.0)

## 2012-10-16 LAB — PREALBUMIN: Prealbumin: 9.6 mg/dL — ABNORMAL LOW (ref 17.0–34.0)

## 2012-10-16 LAB — URINE MICROSCOPIC-ADD ON

## 2012-10-16 LAB — PHOSPHORUS: Phosphorus: 5.5 mg/dL — ABNORMAL HIGH (ref 2.3–4.6)

## 2012-10-16 LAB — C-REACTIVE PROTEIN: CRP: 17.9 mg/dL — ABNORMAL HIGH (ref <0.60)

## 2012-10-17 ENCOUNTER — Other Ambulatory Visit (HOSPITAL_COMMUNITY): Payer: Self-pay

## 2012-10-17 LAB — BASIC METABOLIC PANEL
CO2: 30 mEq/L (ref 19–32)
Chloride: 98 mEq/L (ref 96–112)
Creatinine, Ser: 2.43 mg/dL — ABNORMAL HIGH (ref 0.50–1.35)
GFR calc Af Amer: 30 mL/min — ABNORMAL LOW (ref >90)
Potassium: 4 mEq/L (ref 3.5–5.1)
Sodium: 140 mEq/L (ref 135–145)

## 2012-10-17 LAB — PROTIME-INR
INR: 1.14 (ref 0.00–1.49)
Prothrombin Time: 14.4 seconds (ref 11.6–15.2)

## 2012-10-17 LAB — CBC
MCV: 86.2 fL (ref 78.0–100.0)
Platelets: 234 10*3/uL (ref 150–400)
RBC: 2.9 MIL/uL — ABNORMAL LOW (ref 4.22–5.81)
RDW: 15.6 % — ABNORMAL HIGH (ref 11.5–15.5)
WBC: 2.9 10*3/uL — ABNORMAL LOW (ref 4.0–10.5)

## 2012-10-17 LAB — URINE CULTURE: Culture: NO GROWTH

## 2012-10-17 MED ORDER — FENTANYL CITRATE 0.05 MG/ML IJ SOLN
INTRAMUSCULAR | Status: AC | PRN
  Administered 2012-10-17 (×4): 25 ug via INTRAVENOUS

## 2012-10-17 MED ORDER — MIDAZOLAM HCL 2 MG/2ML IJ SOLN
INTRAMUSCULAR | Status: AC | PRN
  Administered 2012-10-17 (×2): 0.5 mg via INTRAVENOUS
  Administered 2012-10-17: 1 mg via INTRAVENOUS

## 2012-10-17 NOTE — H&P (Signed)
Alex Petty is an 67 y.o. male.   Chief Complaint: pt has had temporary dialysis catheter in place since 08/2102 Exchanged for trialysis cath 09/2012 Worsening renal fxn; dialysis dependent  Per Dr Thedore Mins- Renal MD Requesting permanent catheter  HPI: HTN; renal failure; gout; a fib; colon ca; CLL Prev + BC (09/2012) neg x 2 10/5 and 10/6-- On Vanco +UA: neg cx 10/6--pt on Meropenem  Past Medical History  Diagnosis Date  . Arthritis   . Hypertension   . DJD (degenerative joint disease)   . DJD (degenerative joint disease), ankle and foot   . Arthritis   . Cellulitis   . Arthritis   . Chronic back pain   . Gout   . Dysrhythmia     afibrillation  . Atrial fibrillation   . Chronic kidney disease   . Colon cancer     2005  . 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 Charcot ankle     dr. Emilio Math    Past Surgical History  Procedure Laterality Date  . Foot surgery      right foot-  . Colon surgery      forcolon cancer, 2005, Dr. Marcell Anger in [place]  . Tonsillectomy    . Cystoscopy with urethral dilatation    . Back surgery      x2  . Cataract extraction w/phaco  01/14/2011    Procedure: CATARACT EXTRACTION PHACO AND INTRAOCULAR LENS PLACEMENT (IOC);  Surgeon: Gemma Payor;  Location: [facility];  Service: Ophthalmology;  Laterality: Left;  CDE=16.27  . Lymph node biopsy Right 07/11/2012    Procedure: CERVICAL LYMPH NODE BIOPSY;  Surgeon: Marlane Hatcher, MD;  Location: [facility];  Service: General;  Laterality: Right;  end @ 1101  . Portacath placement Right 07/11/2012    Procedure: INSERTION PORT-A-CATH;  Surgeon: Marlane Hatcher, MD;  Location: [facility];  Service: General;  Laterality: Right;  . Port-a-cath removal Right 08/05/2012    Procedure: REMOVAL PORT-A-CATH;  Surgeon: Shelly Rubenstein, MD;  Location: [facility];  Service: General;  Laterality: Right;    Family History  Problem Relation Age of Onset  . Anesthesia problems Neg Hx   . Hypotension Neg Hx   .  Malignant hyperthermia Neg Hx   . Pseudochol deficiency Neg Hx   . Breast cancer Mother   . Heart disease Mother    Social History:  reports that he has never smoked. He has never used smokeless tobacco. He reports that he does not drink alcohol or use illicit drugs.  Allergies:  Allergies  Allergen Reactions  . Ace Inhibitors Other (See Comments)    cough  . Rocephin [Ceftriaxone Sodium] Hives  . Beta Adrenergic Blockers Rash    Medications Prior to Admission  Medication Sig Dispense Refill  . acetaminophen (TYLENOL) 325 MG tablet Take by mouth. Take 2 (325mg  tabs) 1 hour prior to Rituxan treatment.      Marland Kitchen allopurinol (ZYLOPRIM) 300 MG tablet Take 1 tablet (300 mg total) by mouth daily.  300 tablet  2  . BOSWELLIA SERRATA PO Take 1 capsule by mouth daily. With Vitamin D3      . ceFAZolin (ANCEF) 2-3 GM-% SOLR Inject 50 mLs (2 g total) into the vein every 12 (twelve) hours.      Marland Kitchen diltiazem (CARDIZEM CD) 180 MG 24 hr capsule Take 1 capsule (180 mg total) by mouth every morning.      . enoxaparin (LOVENOX) 150 MG/ML injection Inject 1 mL (150  mg total) into the skin every 12 (twelve) hours.  0 Syringe    . famotidine (PEPCID) 20 MG tablet Take 1 tablet (20 mg total) by mouth 2 (two) times daily.      . Glucosamine-Chondroit-Vit C-Mn (GLUCOSAMINE 1500 COMPLEX PO) Take 1 tablet by mouth every morning.       Marland Kitchen HYDROcodone-acetaminophen (NORCO) 7.5-325 MG per tablet Take 1 tablet by mouth every 6 (six) hours as needed for pain.      Marland Kitchen HYDROmorphone (DILAUDID) 4 MG tablet Take 1 tablet (4 mg total) by mouth every 6 (six) hours as needed for pain.  20 tablet  0  . lactose free nutrition (BOOST PLUS) LIQD Take 237 mLs by mouth 3 (three) times daily with meals.    0  . Multiple Vitamins-Minerals (MULTIVITAMINS THER. W/MINERALS) TABS Take 1 tablet by mouth every morning.       . [EXPIRED] predniSONE (DELTASONE) 20 MG tablet Take 2 tablets (40 mg total) by mouth daily with breakfast.  2 tablet  0   . sodium bicarbonate 650 MG tablet Take 1 tablet (650 mg total) by mouth 3 (three) times daily.      . vitamin B-12 1000 MCG tablet Take 1 tablet (1,000 mcg total) by mouth daily.      . vitamin C (ASCORBIC ACID) 500 MG tablet Take 500 mg by mouth daily.      Marland Kitchen warfarin (COUMADIN) 5 MG tablet Take 1 tablet (5 mg total) by mouth daily.  30 tablet  1    Results for orders placed during the hospital encounter of 08/17/12 (from the past 48 hour(s))  CBC WITH DIFFERENTIAL     Status: Abnormal   Collection Time    10/16/12  2:04 AM      Result Value Range   WBC 7.0  4.0 - 10.5 K/uL   RBC 2.93 (*) 4.22 - 5.81 MIL/uL   Hemoglobin 8.3 (*) 13.0 - 17.0 g/dL   HCT 16.1 (*) 09.6 - 04.5 %   MCV 88.4  78.0 - 100.0 fL   MCH 28.3  26.0 - 34.0 pg   MCHC 32.0  30.0 - 36.0 g/dL   RDW 40.9 (*) 81.1 - 91.4 %   Platelets 257  150 - 400 K/uL   Neutrophils Relative % 76  43 - 77 %   Neutro Abs 5.3  1.7 - 7.7 K/uL   Lymphocytes Relative 13  12 - 46 %   Lymphs Abs 0.9  0.7 - 4.0 K/uL   Monocytes Relative 11  3 - 12 %   Monocytes Absolute 0.7  0.1 - 1.0 K/uL   Eosinophils Relative 1  0 - 5 %   Eosinophils Absolute 0.0  0.0 - 0.7 K/uL   Basophils Relative 0  0 - 1 %   Basophils Absolute 0.0  0.0 - 0.1 K/uL  COMPREHENSIVE METABOLIC PANEL     Status: Abnormal   Collection Time    10/16/12  2:04 AM      Result Value Range   Sodium 138  135 - 145 mEq/L   Potassium 4.6  3.5 - 5.1 mEq/L   Chloride 95 (*) 96 - 112 mEq/L   CO2 30  19 - 32 mEq/L   Glucose, Bld 134 (*) 70 - 99 mg/dL   BUN 75 (*) 6 - 23 mg/dL   Creatinine, Ser 7.82 (*) 0.50 - 1.35 mg/dL   Calcium 8.3 (*) 8.4 - 10.5 mg/dL   Total Protein 5.4 (*) 6.0 -  8.3 g/dL   Albumin 2.3 (*) 3.5 - 5.2 g/dL   AST 11  0 - 37 U/L   ALT 5  0 - 53 U/L   Alkaline Phosphatase 88  39 - 117 U/L   Total Bilirubin 0.4  0.3 - 1.2 mg/dL   GFR calc non Af Amer 21 (*) >90 mL/min   GFR calc Af Amer 24 (*) >90 mL/min   Comment: (NOTE)     The eGFR has been calculated  using the CKD EPI equation.     This calculation has not been validated in all clinical situations.     eGFR's persistently <90 mL/min signify possible Chronic Kidney     Disease.  PHOSPHORUS     Status: Abnormal   Collection Time    10/16/12  2:04 AM      Result Value Range   Phosphorus 5.5 (*) 2.3 - 4.6 mg/dL  URIC ACID     Status: None   Collection Time    10/16/12  2:04 AM      Result Value Range   Uric Acid, Serum 6.5  4.0 - 7.8 mg/dL  CULTURE, BLOOD (ROUTINE X 2)     Status: None   Collection Time    10/16/12  2:46 AM      Result Value Range   Specimen Description BLOOD RIGHT NECK     Special Requests BOTTLES DRAWN AEROBIC AND ANAEROBIC 10CC     Culture  Setup Time       Value: 10/16/2012 09:29     Performed at [facility]   Culture       Value:        BLOOD CULTURE RECEIVED NO GROWTH TO DATE CULTURE WILL BE HELD FOR 5 DAYS BEFORE ISSUING A FINAL NEGATIVE REPORT     Performed at [facility]   Report Status PENDING    URINALYSIS, ROUTINE W REFLEX MICROSCOPIC     Status: Abnormal   Collection Time    10/16/12  5:15 AM      Result Value Range   Color, Urine YELLOW  YELLOW   APPearance CLOUDY (*) CLEAR   Specific Gravity, Urine 1.022  1.005 - 1.030   pH 5.0  5.0 - 8.0   Glucose, UA NEGATIVE  NEGATIVE mg/dL   Hgb urine dipstick NEGATIVE  NEGATIVE   Bilirubin Urine NEGATIVE  NEGATIVE   Ketones, ur NEGATIVE  NEGATIVE mg/dL   Protein, ur NEGATIVE  NEGATIVE mg/dL   Urobilinogen, UA 0.2  0.0 - 1.0 mg/dL   Nitrite NEGATIVE  NEGATIVE   Leukocytes, UA LARGE (*) NEGATIVE  URINE CULTURE     Status: None   Collection Time    10/16/12  5:15 AM      Result Value Range   Specimen Description URINE, RANDOM     Special Requests NONE     Culture  Setup Time       Value: 10/16/2012 09:38     Performed at [facility] Count       Value: NO GROWTH     Performed at [facility]   Culture       Value: NO GROWTH     Performed at [facility]   Report Status 10/17/2012 FINAL    URINE MICROSCOPIC-ADD ON     Status: Abnormal   Collection Time    10/16/12  5:15 AM      Result Value Range   Squamous Epithelial /  LPF MANY (*) RARE   WBC, UA 21-50  <3 WBC/hpf   RBC / HPF 0-2  <3 RBC/hpf   Bacteria, UA FEW (*) RARE   Casts GRANULAR CAST (*) NEGATIVE   Comment: HYALINE CASTS   Urine-Other AMORPHOUS URATES/PHOSPHATES     Comment: FEW YEAST  PROTIME-INR     Status: Abnormal   Collection Time    10/16/12  7:40 AM      Result Value Range   Prothrombin Time 15.5 (*) 11.6 - 15.2 seconds   INR 1.26  0.00 - 1.49  SEDIMENTATION RATE     Status: Abnormal   Collection Time    10/16/12  7:40 AM      Result Value Range   Sed Rate 100 (*) 0 - 16 mm/hr  PREALBUMIN     Status: Abnormal   Collection Time    10/16/12  7:40 AM      Result Value Range   Prealbumin 9.6 (*) 17.0 - 34.0 mg/dL   Comment: Performed at [facility]  COMPREHENSIVE METABOLIC PANEL     Status: Abnormal   Collection Time    10/16/12  7:40 AM      Result Value Range   Sodium 139  135 - 145 mEq/L   Potassium 5.0  3.5 - 5.1 mEq/L   Chloride 98  96 - 112 mEq/L   CO2 29  19 - 32 mEq/L   Glucose, Bld 155 (*) 70 - 99 mg/dL   BUN 79 (*) 6 - 23 mg/dL   Creatinine, Ser 4.69 (*) 0.50 - 1.35 mg/dL   Calcium 8.7  8.4 - 62.9 mg/dL   Total Protein 5.8 (*) 6.0 - 8.3 g/dL   Albumin 2.4 (*) 3.5 - 5.2 g/dL   AST 12  0 - 37 U/L   ALT 5  0 - 53 U/L   Alkaline Phosphatase 92  39 - 117 U/L   Total Bilirubin 0.3  0.3 - 1.2 mg/dL   GFR calc non Af Amer 20 (*) >90 mL/min   GFR calc Af Amer 23 (*) >90 mL/min   Comment: (NOTE)     The eGFR has been calculated using the CKD EPI equation.     This calculation has not been validated in all clinical situations.     eGFR's persistently <90 mL/min signify possible Chronic Kidney     Disease.  C-REACTIVE PROTEIN     Status: Abnormal   Collection Time    10/16/12  2:05 PM      Result Value Range   CRP 17.9 (*)  <0.60 mg/dL   Comment: Performed at [facility]  PROTIME-INR     Status: None   Collection Time    10/17/12  6:10 AM      Result Value Range   Prothrombin Time 14.4  11.6 - 15.2 seconds   INR 1.14  0.00 - 1.49  CBC     Status: Abnormal   Collection Time    10/17/12  6:10 AM      Result Value Range   WBC 2.9 (*) 4.0 - 10.5 K/uL   RBC 2.90 (*) 4.22 - 5.81 MIL/uL   Hemoglobin 8.2 (*) 13.0 - 17.0 g/dL   HCT 52.8 (*) 41.3 - 24.4 %   MCV 86.2  78.0 - 100.0 fL   MCH 28.3  26.0 - 34.0 pg   MCHC 32.8  30.0 - 36.0 g/dL   RDW 01.0 (*) 27.2 - 53.6 %   Platelets  234  150 - 400 K/uL  BASIC METABOLIC PANEL     Status: Abnormal   Collection Time    10/17/12  6:10 AM      Result Value Range   Sodium 140  135 - 145 mEq/L   Potassium 4.0  3.5 - 5.1 mEq/L   Chloride 98  96 - 112 mEq/L   CO2 30  19 - 32 mEq/L   Glucose, Bld 179 (*) 70 - 99 mg/dL   BUN 66 (*) 6 - 23 mg/dL   Creatinine, Ser 0.98 (*) 0.50 - 1.35 mg/dL   Calcium 8.5  8.4 - 11.9 mg/dL   GFR calc non Af Amer 26 (*) >90 mL/min   GFR calc Af Amer 30 (*) >90 mL/min   Comment: (NOTE)     The eGFR has been calculated using the CKD EPI equation.     This calculation has not been validated in all clinical situations.     eGFR's persistently <90 mL/min signify possible Chronic Kidney     Disease.   No results found.  Review of Systems  Constitutional: Negative for fever.  HENT: Negative for neck pain.   Respiratory: Negative for shortness of breath.   Gastrointestinal: Negative for nausea, vomiting and abdominal pain.  Genitourinary:       Foley in place 10/6 UA: = wbcs; Ucx no growth- final  Neurological: Positive for weakness. Negative for dizziness and headaches.    There were no vitals taken for this visit. Physical Exam  Constitutional: He is oriented to person, place, and time.  Cardiovascular: Normal rate and regular rhythm.   No murmur heard. Respiratory: Effort normal.  GI: Bowel sounds are normal.   Musculoskeletal: Normal range of motion.  Neurological: He is alert and oriented to person, place, and time.  Psychiatric: He has a normal mood and affect. His behavior is normal. Judgment and thought content normal.     Assessment/Plan Renal failure Now dialysis dependent per Dr Thedore Mins Has temp catheter in place Need perm cath Pt aware of procedure benefits and risks and agreeable to proceed Consent signed and in chart On Vanco Asking [facility] MD if needs to treat +UA/ neg Cx -----pt on Meropenem per PA for urine   [place] A 10/17/2012, 8:27 AM

## 2012-10-17 NOTE — Procedures (Signed)
Procedure:  Tunneled hemodialysis catheter placement Access:  Right IJ vein 23 cm tip to cuff length Bard Hemosplit catheter placed with tip in RA.  No PTX.  OK to use.

## 2012-10-17 NOTE — H&P (Signed)
Agree 

## 2012-10-18 LAB — RENAL FUNCTION PANEL
Albumin: 2.5 g/dL — ABNORMAL LOW (ref 3.5–5.2)
CO2: 28 mEq/L (ref 19–32)
Calcium: 8.7 mg/dL (ref 8.4–10.5)
Chloride: 97 mEq/L (ref 96–112)
Creatinine, Ser: 2.79 mg/dL — ABNORMAL HIGH (ref 0.50–1.35)
GFR calc non Af Amer: 22 mL/min — ABNORMAL LOW (ref >90)
Glucose, Bld: 155 mg/dL — ABNORMAL HIGH (ref 70–99)
Potassium: 4.2 mEq/L (ref 3.5–5.1)
Sodium: 139 mEq/L (ref 135–145)

## 2012-10-18 LAB — CBC
HCT: 27 % — ABNORMAL LOW (ref 39.0–52.0)
Hemoglobin: 9 g/dL — ABNORMAL LOW (ref 13.0–17.0)
MCH: 28.6 pg (ref 26.0–34.0)
MCV: 85.7 fL (ref 78.0–100.0)
RBC: 3.15 MIL/uL — ABNORMAL LOW (ref 4.22–5.81)
WBC: 4.5 10*3/uL (ref 4.0–10.5)

## 2012-10-18 LAB — VANCOMYCIN, RANDOM: Vancomycin Rm: 14.3 ug/mL

## 2012-10-18 LAB — PROTIME-INR: INR: 1.12 (ref 0.00–1.49)

## 2012-10-19 ENCOUNTER — Other Ambulatory Visit (HOSPITAL_COMMUNITY): Payer: Medicare Other

## 2012-10-19 LAB — PROTIME-INR: INR: 1.08 (ref 0.00–1.49)

## 2012-10-19 NOTE — Progress Notes (Signed)
Successful removal of left IJ Trialysis catheter. No complications.  Brayton El PA-C Interventional Radiology 10/19/2012 11:53 AM

## 2012-10-20 LAB — RENAL FUNCTION PANEL
Albumin: 2.6 g/dL — ABNORMAL LOW (ref 3.5–5.2)
BUN: 88 mg/dL — ABNORMAL HIGH (ref 6–23)
Chloride: 95 mEq/L — ABNORMAL LOW (ref 96–112)
Creatinine, Ser: 2.72 mg/dL — ABNORMAL HIGH (ref 0.50–1.35)
GFR calc non Af Amer: 23 mL/min — ABNORMAL LOW (ref >90)
Phosphorus: 4.4 mg/dL (ref 2.3–4.6)
Potassium: 4 mEq/L (ref 3.5–5.1)

## 2012-10-20 LAB — PROTIME-INR
INR: 1.09 (ref 0.00–1.49)
Prothrombin Time: 13.9 seconds (ref 11.6–15.2)

## 2012-10-20 LAB — BASIC METABOLIC PANEL
Calcium: 8.4 mg/dL (ref 8.4–10.5)
Creatinine, Ser: 2.72 mg/dL — ABNORMAL HIGH (ref 0.50–1.35)
GFR calc Af Amer: 26 mL/min — ABNORMAL LOW (ref >90)
Glucose, Bld: 154 mg/dL — ABNORMAL HIGH (ref 70–99)
Sodium: 138 mEq/L (ref 135–145)

## 2012-10-20 LAB — CBC
Hemoglobin: 9.3 g/dL — ABNORMAL LOW (ref 13.0–17.0)
MCH: 28 pg (ref 26.0–34.0)
MCV: 84.9 fL (ref 78.0–100.0)
Platelets: 287 10*3/uL (ref 150–400)
RBC: 3.32 MIL/uL — ABNORMAL LOW (ref 4.22–5.81)
RDW: 15.6 % — ABNORMAL HIGH (ref 11.5–15.5)
WBC: 7.6 10*3/uL (ref 4.0–10.5)

## 2012-10-20 LAB — PHOSPHORUS: Phosphorus: 4.4 mg/dL (ref 2.3–4.6)

## 2012-10-20 LAB — MAGNESIUM: Magnesium: 1.9 mg/dL (ref 1.5–2.5)

## 2012-10-22 LAB — CULTURE, BLOOD (ROUTINE X 2): Culture: NO GROWTH

## 2012-10-22 LAB — PROTIME-INR: Prothrombin Time: 14.1 seconds (ref 11.6–15.2)

## 2012-10-23 LAB — CBC WITH DIFFERENTIAL/PLATELET
Basophils Absolute: 0.1 10*3/uL (ref 0.0–0.1)
Basophils Relative: 1 % (ref 0–1)
Eosinophils Relative: 1 % (ref 0–5)
HCT: 30.6 % — ABNORMAL LOW (ref 39.0–52.0)
Hemoglobin: 10.1 g/dL — ABNORMAL LOW (ref 13.0–17.0)
Lymphocytes Relative: 7 % — ABNORMAL LOW (ref 12–46)
MCV: 85.7 fL (ref 78.0–100.0)
Monocytes Relative: 5 % (ref 3–12)
Neutro Abs: 8.6 10*3/uL — ABNORMAL HIGH (ref 1.7–7.7)
RBC: 3.57 MIL/uL — ABNORMAL LOW (ref 4.22–5.81)
RDW: 16.7 % — ABNORMAL HIGH (ref 11.5–15.5)
WBC: 10 10*3/uL (ref 4.0–10.5)

## 2012-10-23 LAB — PROTIME-INR
INR: 1.05 (ref 0.00–1.49)
Prothrombin Time: 13.5 seconds (ref 11.6–15.2)

## 2012-10-23 LAB — RENAL FUNCTION PANEL
Albumin: 2.4 g/dL — ABNORMAL LOW (ref 3.5–5.2)
BUN: 105 mg/dL — ABNORMAL HIGH (ref 6–23)
CO2: 27 mEq/L (ref 19–32)
Calcium: 7.6 mg/dL — ABNORMAL LOW (ref 8.4–10.5)
Chloride: 96 mEq/L (ref 96–112)
Creatinine, Ser: 3.05 mg/dL — ABNORMAL HIGH (ref 0.50–1.35)
GFR calc Af Amer: 23 mL/min — ABNORMAL LOW (ref >90)
GFR calc non Af Amer: 20 mL/min — ABNORMAL LOW (ref >90)
Glucose, Bld: 102 mg/dL — ABNORMAL HIGH (ref 70–99)
Phosphorus: 5.6 mg/dL — ABNORMAL HIGH (ref 2.3–4.6)
Potassium: 3.9 mEq/L (ref 3.5–5.1)
Sodium: 136 mEq/L (ref 135–145)

## 2012-10-23 LAB — PREALBUMIN: Prealbumin: 36 mg/dL — ABNORMAL HIGH (ref 17.0–34.0)

## 2012-10-23 LAB — SEDIMENTATION RATE: Sed Rate: 19 mm/hr — ABNORMAL HIGH (ref 0–16)

## 2012-10-24 LAB — PROTIME-INR: Prothrombin Time: 13.7 seconds (ref 11.6–15.2)

## 2012-10-25 LAB — RENAL FUNCTION PANEL
Albumin: 2.1 g/dL — ABNORMAL LOW (ref 3.5–5.2)
CO2: 27 mEq/L (ref 19–32)
Calcium: 7.6 mg/dL — ABNORMAL LOW (ref 8.4–10.5)
Chloride: 102 mEq/L (ref 96–112)
Creatinine, Ser: 3.05 mg/dL — ABNORMAL HIGH (ref 0.50–1.35)
GFR calc Af Amer: 23 mL/min — ABNORMAL LOW (ref >90)
GFR calc non Af Amer: 20 mL/min — ABNORMAL LOW (ref >90)
Glucose, Bld: 94 mg/dL (ref 70–99)
Phosphorus: 5.1 mg/dL — ABNORMAL HIGH (ref 2.3–4.6)
Potassium: 4.2 mEq/L (ref 3.5–5.1)

## 2012-10-25 LAB — CBC
Hemoglobin: 8 g/dL — ABNORMAL LOW (ref 13.0–17.0)
MCH: 28.4 pg (ref 26.0–34.0)
MCV: 86.5 fL (ref 78.0–100.0)
Platelets: 139 10*3/uL — ABNORMAL LOW (ref 150–400)
RBC: 2.82 MIL/uL — ABNORMAL LOW (ref 4.22–5.81)
RDW: 17.4 % — ABNORMAL HIGH (ref 11.5–15.5)
WBC: 5.7 10*3/uL (ref 4.0–10.5)

## 2012-11-04 ENCOUNTER — Encounter (HOSPITAL_COMMUNITY): Payer: Self-pay | Admitting: Emergency Medicine

## 2012-11-04 ENCOUNTER — Emergency Department (HOSPITAL_COMMUNITY): Payer: Medicare Other

## 2012-11-04 ENCOUNTER — Inpatient Hospital Stay (HOSPITAL_COMMUNITY)
Admission: EM | Admit: 2012-11-04 | Discharge: 2012-11-06 | DRG: 553 | Disposition: A | Discharge status: Skilled Nursing Facility | Payer: Medicare Other | Attending: Internal Medicine | Admitting: Internal Medicine

## 2012-11-04 DIAGNOSIS — A4101 Sepsis due to Methicillin susceptible Staphylococcus aureus: Secondary | ICD-10-CM

## 2012-11-04 DIAGNOSIS — I8310 Varicose veins of unspecified lower extremity with inflammation: Secondary | ICD-10-CM

## 2012-11-04 DIAGNOSIS — R609 Edema, unspecified: Secondary | ICD-10-CM

## 2012-11-04 DIAGNOSIS — R2231 Localized swelling, mass and lump, right upper limb: Secondary | ICD-10-CM

## 2012-11-04 DIAGNOSIS — Z992 Dependence on renal dialysis: Secondary | ICD-10-CM | POA: Diagnosis present

## 2012-11-04 DIAGNOSIS — A419 Sepsis, unspecified organism: Secondary | ICD-10-CM

## 2012-11-04 DIAGNOSIS — R7881 Bacteremia: Secondary | ICD-10-CM

## 2012-11-04 DIAGNOSIS — C9110 Chronic lymphocytic leukemia of B-cell type not having achieved remission: Secondary | ICD-10-CM | POA: Diagnosis present

## 2012-11-04 DIAGNOSIS — L03116 Cellulitis of left lower limb: Secondary | ICD-10-CM

## 2012-11-04 DIAGNOSIS — D709 Neutropenia, unspecified: Secondary | ICD-10-CM

## 2012-11-04 DIAGNOSIS — Z01810 Encounter for preprocedural cardiovascular examination: Secondary | ICD-10-CM

## 2012-11-04 DIAGNOSIS — M549 Dorsalgia, unspecified: Secondary | ICD-10-CM

## 2012-11-04 DIAGNOSIS — N186 End stage renal disease: Secondary | ICD-10-CM | POA: Diagnosis present

## 2012-11-04 DIAGNOSIS — I120 Hypertensive chronic kidney disease with stage 5 chronic kidney disease or end stage renal disease: Secondary | ICD-10-CM | POA: Diagnosis present

## 2012-11-04 DIAGNOSIS — M109 Gout, unspecified: Principal | ICD-10-CM | POA: Diagnosis present

## 2012-11-04 DIAGNOSIS — I872 Venous insufficiency (chronic) (peripheral): Secondary | ICD-10-CM | POA: Diagnosis present

## 2012-11-04 DIAGNOSIS — M7989 Other specified soft tissue disorders: Secondary | ICD-10-CM | POA: Diagnosis present

## 2012-11-04 DIAGNOSIS — N390 Urinary tract infection, site not specified: Secondary | ICD-10-CM | POA: Diagnosis present

## 2012-11-04 DIAGNOSIS — G9340 Encephalopathy, unspecified: Secondary | ICD-10-CM | POA: Diagnosis present

## 2012-11-04 DIAGNOSIS — N19 Unspecified kidney failure: Secondary | ICD-10-CM

## 2012-11-04 DIAGNOSIS — Z79899 Other long term (current) drug therapy: Secondary | ICD-10-CM

## 2012-11-04 DIAGNOSIS — Z85038 Personal history of other malignant neoplasm of large intestine: Secondary | ICD-10-CM

## 2012-11-04 DIAGNOSIS — Z7401 Bed confinement status: Secondary | ICD-10-CM | POA: Diagnosis present

## 2012-11-04 DIAGNOSIS — M009 Pyogenic arthritis, unspecified: Secondary | ICD-10-CM

## 2012-11-04 DIAGNOSIS — M4646 Discitis, unspecified, lumbar region: Secondary | ICD-10-CM

## 2012-11-04 DIAGNOSIS — M1990 Unspecified osteoarthritis, unspecified site: Secondary | ICD-10-CM

## 2012-11-04 DIAGNOSIS — M79601 Pain in right arm: Secondary | ICD-10-CM | POA: Diagnosis present

## 2012-11-04 DIAGNOSIS — L0102 Bockhart's impetigo: Secondary | ICD-10-CM

## 2012-11-04 DIAGNOSIS — M79609 Pain in unspecified limb: Secondary | ICD-10-CM

## 2012-11-04 DIAGNOSIS — Z113 Encounter for screening for infections with a predominantly sexual mode of transmission: Secondary | ICD-10-CM

## 2012-11-04 DIAGNOSIS — Z9221 Personal history of antineoplastic chemotherapy: Secondary | ICD-10-CM

## 2012-11-04 DIAGNOSIS — I4891 Unspecified atrial fibrillation: Secondary | ICD-10-CM | POA: Diagnosis present

## 2012-11-04 DIAGNOSIS — R4182 Altered mental status, unspecified: Secondary | ICD-10-CM | POA: Diagnosis present

## 2012-11-04 DIAGNOSIS — D61810 Antineoplastic chemotherapy induced pancytopenia: Secondary | ICD-10-CM

## 2012-11-04 DIAGNOSIS — M4640 Discitis, unspecified, site unspecified: Secondary | ICD-10-CM

## 2012-11-04 DIAGNOSIS — Z6839 Body mass index (BMI) 39.0-39.9, adult: Secondary | ICD-10-CM

## 2012-11-04 DIAGNOSIS — Z794 Long term (current) use of insulin: Secondary | ICD-10-CM

## 2012-11-04 HISTORY — DX: Bacteremia: R78.81

## 2012-11-04 LAB — CBC WITH DIFFERENTIAL/PLATELET
Basophils Relative: 0 % (ref 0–1)
HCT: 27.2 % — ABNORMAL LOW (ref 39.0–52.0)
Hemoglobin: 8.8 g/dL — ABNORMAL LOW (ref 13.0–17.0)
Lymphs Abs: 0.6 10*3/uL — ABNORMAL LOW (ref 0.7–4.0)
MCH: 27.8 pg (ref 26.0–34.0)
MCHC: 32.4 g/dL (ref 30.0–36.0)
Monocytes Absolute: 0.6 10*3/uL (ref 0.1–1.0)
Monocytes Relative: 5 % (ref 3–12)
Neutro Abs: 11.2 10*3/uL — ABNORMAL HIGH (ref 1.7–7.7)
Neutrophils Relative %: 90 % — ABNORMAL HIGH (ref 43–77)
Platelets: 192 10*3/uL (ref 150–400)
RBC: 3.17 MIL/uL — ABNORMAL LOW (ref 4.22–5.81)
WBC: 12.4 10*3/uL — ABNORMAL HIGH (ref 4.0–10.5)

## 2012-11-04 LAB — COMPREHENSIVE METABOLIC PANEL
Albumin: 2.7 g/dL — ABNORMAL LOW (ref 3.5–5.2)
Alkaline Phosphatase: 90 U/L (ref 39–117)
BUN: 24 mg/dL — ABNORMAL HIGH (ref 6–23)
Chloride: 91 mEq/L — ABNORMAL LOW (ref 96–112)
Creatinine, Ser: 2.45 mg/dL — ABNORMAL HIGH (ref 0.50–1.35)
GFR calc Af Amer: 30 mL/min — ABNORMAL LOW (ref >90)
GFR calc non Af Amer: 26 mL/min — ABNORMAL LOW (ref >90)
Glucose, Bld: 112 mg/dL — ABNORMAL HIGH (ref 70–99)
Potassium: 3.5 mEq/L (ref 3.5–5.1)
Total Bilirubin: 0.5 mg/dL (ref 0.3–1.2)

## 2012-11-04 LAB — URINALYSIS, ROUTINE W REFLEX MICROSCOPIC
Glucose, UA: NEGATIVE mg/dL
Nitrite: NEGATIVE
Specific Gravity, Urine: >1.03 — ABNORMAL HIGH (ref 1.005–1.030)
pH: 5 (ref 5.0–8.0)

## 2012-11-04 LAB — PROTIME-INR: Prothrombin Time: 14.6 seconds (ref 11.6–15.2)

## 2012-11-04 LAB — MAGNESIUM: Magnesium: 1.8 mg/dL (ref 1.5–2.5)

## 2012-11-04 LAB — TROPONIN I: Troponin I: <0.3 ng/mL (ref <0.30)

## 2012-11-04 LAB — PHOSPHORUS: Phosphorus: 2.9 mg/dL (ref 2.3–4.6)

## 2012-11-04 LAB — DIGOXIN LEVEL: Digoxin Level: 1.1 ng/mL (ref 0.8–2.0)

## 2012-11-04 LAB — APTT: aPTT: 38 seconds — ABNORMAL HIGH (ref 24–37)

## 2012-11-04 LAB — URINE MICROSCOPIC-ADD ON

## 2012-11-04 LAB — AMMONIA: Ammonia: 15 umol/L (ref 11–60)

## 2012-11-04 MED ORDER — INSULIN ASPART 100 UNIT/ML ~~LOC~~ SOLN
0.0000 [IU] | Freq: Three times a day (TID) | SUBCUTANEOUS | Status: DC
  Administered 2012-11-05: 1 [IU] via SUBCUTANEOUS
  Administered 2012-11-06: 2 [IU] via SUBCUTANEOUS

## 2012-11-04 MED ORDER — VITAMIN B-12 1000 MCG PO TABS
1000.0000 ug | ORAL_TABLET | Freq: Every day | ORAL | Status: DC
  Administered 2012-11-05 – 2012-11-06 (×2): 1000 ug via ORAL
  Filled 2012-11-04 (×2): qty 1

## 2012-11-04 MED ORDER — CIPROFLOXACIN IN D5W 400 MG/200ML IV SOLN
400.0000 mg | Freq: Once | INTRAVENOUS | Status: AC
  Administered 2012-11-04: 400 mg via INTRAVENOUS
  Filled 2012-11-04: qty 200

## 2012-11-04 MED ORDER — DILTIAZEM HCL ER BEADS 120 MG PO CP24
120.0000 mg | ORAL_CAPSULE | Freq: Every day | ORAL | Status: DC
  Administered 2012-11-05 – 2012-11-06 (×2): 120 mg via ORAL
  Filled 2012-11-04 (×2): qty 1

## 2012-11-04 MED ORDER — ENOXAPARIN SODIUM 100 MG/ML ~~LOC~~ SOLN
127.0000 mg | Freq: Once | SUBCUTANEOUS | Status: AC
  Administered 2012-11-04: 125 mg via SUBCUTANEOUS

## 2012-11-04 MED ORDER — ACETAMINOPHEN 325 MG PO TABS: 650.0000 mg | ORAL_TABLET | Freq: Four times a day (QID) | ORAL | Status: DC | PRN

## 2012-11-04 MED ORDER — GLUCERNA SHAKE PO LIQD
237.0000 mL | Freq: Three times a day (TID) | ORAL | Status: DC
  Administered 2012-11-05 – 2012-11-06 (×4): 237 mL via ORAL

## 2012-11-04 MED ORDER — ALUM & MAG HYDROXIDE-SIMETH 200-200-20 MG/5ML PO SUSP
30.0000 mL | Freq: Four times a day (QID) | ORAL | Status: DC | PRN
  Filled 2012-11-04: qty 30

## 2012-11-04 MED ORDER — OXYCODONE HCL 5 MG PO TABS: 5.0000 mg | ORAL_TABLET | ORAL | Status: DC | PRN

## 2012-11-04 MED ORDER — ACETAMINOPHEN 650 MG RE SUPP: 650.0000 mg | Freq: Four times a day (QID) | RECTAL | Status: DC | PRN

## 2012-11-04 MED ORDER — PIPERACILLIN-TAZOBACTAM IN DEX 2-0.25 GM/50ML IV SOLN
2.2500 g | Freq: Three times a day (TID) | INTRAVENOUS | Status: DC
  Administered 2012-11-05 (×2): 2.25 g via INTRAVENOUS
  Filled 2012-11-04 (×5): qty 50

## 2012-11-04 MED ORDER — METOCLOPRAMIDE HCL 10 MG PO TABS
10.0000 mg | ORAL_TABLET | Freq: Three times a day (TID) | ORAL | Status: DC
  Administered 2012-11-05 – 2012-11-06 (×5): 10 mg via ORAL
  Filled 2012-11-04 (×6): qty 1

## 2012-11-04 MED ORDER — ONDANSETRON HCL 4 MG/2ML IJ SOLN: 4.0000 mg | Freq: Three times a day (TID) | INTRAMUSCULAR | Status: DC | PRN

## 2012-11-04 MED ORDER — PRO-STAT SUGAR FREE PO LIQD
30.0000 mL | Freq: Three times a day (TID) | ORAL | Status: DC
  Administered 2012-11-05 – 2012-11-06 (×6): 30 mL via ORAL
  Filled 2012-11-04 (×6): qty 30

## 2012-11-04 MED ORDER — PIPERACILLIN-TAZOBACTAM 3.375 G IVPB 30 MIN
3.3750 g | Freq: Once | INTRAVENOUS | Status: DC
  Filled 2012-11-04: qty 50

## 2012-11-04 MED ORDER — DIGOXIN 125 MCG PO TABS
0.1250 mg | ORAL_TABLET | ORAL | Status: DC
  Administered 2012-11-05: 0.125 mg via ORAL
  Filled 2012-11-04: qty 1

## 2012-11-04 MED ORDER — VITAMIN C 500 MG PO TABS
500.0000 mg | ORAL_TABLET | Freq: Two times a day (BID) | ORAL | Status: DC
  Administered 2012-11-05 – 2012-11-06 (×3): 500 mg via ORAL
  Filled 2012-11-04 (×5): qty 1

## 2012-11-04 MED ORDER — ONDANSETRON HCL 4 MG PO TABS: 4.0000 mg | ORAL_TABLET | Freq: Four times a day (QID) | ORAL | Status: DC | PRN

## 2012-11-04 MED ORDER — SODIUM CHLORIDE 0.9 % IV SOLN: 250.0000 mL | INTRAVENOUS | Status: DC | PRN

## 2012-11-04 MED ORDER — SODIUM CHLORIDE 0.9 % IJ SOLN
3.0000 mL | Freq: Two times a day (BID) | INTRAMUSCULAR | Status: DC
  Administered 2012-11-05 (×2): 3 mL via INTRAVENOUS

## 2012-11-04 MED ORDER — ENOXAPARIN SODIUM 100 MG/ML ~~LOC~~ SOLN
160.0000 mg | Freq: Once | SUBCUTANEOUS | Status: DC
  Filled 2012-11-04: qty 2

## 2012-11-04 MED ORDER — CIPROFLOXACIN IN D5W 400 MG/200ML IV SOLN
400.0000 mg | Freq: Two times a day (BID) | INTRAVENOUS | Status: DC
  Filled 2012-11-04: qty 200

## 2012-11-04 MED ORDER — ONDANSETRON HCL 4 MG/2ML IJ SOLN: 4.0000 mg | Freq: Four times a day (QID) | INTRAMUSCULAR | Status: DC | PRN

## 2012-11-04 MED ORDER — ALLOPURINOL 100 MG PO TABS
100.0000 mg | ORAL_TABLET | Freq: Every day | ORAL | Status: DC
  Administered 2012-11-05 – 2012-11-06 (×2): 100 mg via ORAL
  Filled 2012-11-04 (×2): qty 1

## 2012-11-04 MED ORDER — SODIUM CHLORIDE 0.9 % IJ SOLN: 3.0000 mL | INTRAMUSCULAR | Status: DC | PRN

## 2012-11-04 MED ORDER — POLYETHYLENE GLYCOL 3350 17 G PO PACK
17.0000 g | PACK | Freq: Every day | ORAL | Status: DC
  Administered 2012-11-06: 17 g via ORAL
  Filled 2012-11-04 (×2): qty 1

## 2012-11-04 MED ORDER — PREGABALIN 50 MG PO CAPS
75.0000 mg | ORAL_CAPSULE | Freq: Two times a day (BID) | ORAL | Status: DC
  Administered 2012-11-05 – 2012-11-06 (×3): 75 mg via ORAL
  Filled 2012-11-04 (×6): qty 1

## 2012-11-04 MED ORDER — PIPERACILLIN-TAZOBACTAM 3.375 G IVPB
3.3750 g | Freq: Three times a day (TID) | INTRAVENOUS | Status: DC
  Filled 2012-11-04 (×2): qty 50

## 2012-11-04 NOTE — ED Notes (Signed)
Family in to see patient.  Updated on care to date.

## 2012-11-04 NOTE — Progress Notes (Addendum)
Report received from RN at [facility]. Pt will be transported to [facility] rm [phone] via ambulance.

## 2012-11-04 NOTE — ED Notes (Addendum)
Sent from [facility].  States patient returned from dialysis today very lethargic and confused which is not his norm.  Yesterday was alert and oriented.  Spoke w/[facility].  RN there reports patient was lethargic on arrival but easily aroused and oriented w/out fevers.  Was told by patient he took pain medication prior to arrival to dialysis.  Patient has only been on HD x 1-2 weeks, prior to that was at [facility] in [facility].  C/O pain in R arm w/touch.  Arm is 4+ edema, slightly warm

## 2012-11-04 NOTE — ED Notes (Signed)
Dr. Hyacinth Meeker made aware of patient transfer within the hour and reminded him of need for reevaluation.

## 2012-11-04 NOTE — ED Notes (Signed)
In/Out cath done for urine collection.  Large amount purulent drainage cleaned from tip of penis.  Urine is tea colored, cloudy and odiferous.

## 2012-11-04 NOTE — Progress Notes (Signed)
67yo male had two-week hospitalization for sepsis following chemotherapy this past summer and required HD, now on HD, admitted now for swelling of arm and UTI, to begin IV ABX.  Will start Zosyn 2.25g IV Q8H and monitor CBC and Cx.  Vernard Gambles, PharmD, BCPS 11/04/2012 11:35 PM

## 2012-11-04 NOTE — ED Provider Notes (Signed)
CSN: [id number]     Arrival date & time 11/04/12  1413 History  This chart was scribed for Vida Roller, MD by Bennett Scrape, ED Scribe. This patient was seen in room APA02/APA02 and the patient's care was started at 2:18 PM.   Chief Complaint  Patient presents with  . Weakness  . Arm Swelling  . Altered Mental Status   Level 5 Caveat- Somnolence   The history is provided by the EMS personnel. No language interpreter was used.    HPI Comments: Alex Petty is a 67 y.o. male brought in by ambulance from [facility], who presents to the Emergency Department for AMS described as appearing more confused and lethargic after dialysis today. [facility] stated that he was confused and lethargic but easily arousable upon arrival and left in the same condition. They report that the pt had more than a normal amount of fluid removed today. No fevers noted. Currently pt c/o 2 weeks of right arm pain and chronic lower back pain. He denies nausea. He denies having a h/o CMD. Due to AMS, he is unable to answer any further questions.  According to the medical record the patient was treated in July for septicemia and septic shock, at that time he required central line placement, ICU Stay and had progressive renal dysfunction.  Nephrology was consulted and eventually recommended that the patient have a dialysis catheter placed. Prior to this he had been on chemotherapy for chronic lymphocytic leukemia. This ended up causing a pancytopenia and as far as I can tell has been discontinued at this time.   Past Medical History  Diagnosis Date  . Arthritis   . Hypertension   . DJD (degenerative joint disease)   . DJD (degenerative joint disease), ankle and foot   . Arthritis   . Cellulitis   . Arthritis   . Chronic back pain   . Gout   . Dysrhythmia     afibrillation  . Atrial fibrillation   . Chronic kidney disease   . Colon cancer     2005  . CLL (chronic lymphocytic leukemia) 07/19/2012   . Charcot ankle     dr. Emilio Math  . Pneumonia   . Bacteremia    Past Surgical History  Procedure Laterality Date  . Foot surgery      right foot-  . Colon surgery      forcolon cancer, 2005, Dr. Marcell Anger in [place]  . Tonsillectomy    . Cystoscopy with urethral dilatation    . Back surgery      x2  . Cataract extraction w/phaco  01/14/2011    Procedure: CATARACT EXTRACTION PHACO AND INTRAOCULAR LENS PLACEMENT (IOC);  Surgeon: Gemma Payor;  Location: [facility];  Service: Ophthalmology;  Laterality: Left;  CDE=16.27  . Lymph node biopsy Right 07/11/2012    Procedure: CERVICAL LYMPH NODE BIOPSY;  Surgeon: Marlane Hatcher, MD;  Location: [facility];  Service: General;  Laterality: Right;  end @ 1101  . Portacath placement Right 07/11/2012    Procedure: INSERTION PORT-A-CATH;  Surgeon: Marlane Hatcher, MD;  Location: [facility];  Service: General;  Laterality: Right;  . Port-a-cath removal Right 08/05/2012    Procedure: REMOVAL PORT-A-CATH;  Surgeon: Shelly Rubenstein, MD;  Location: [facility];  Service: General;  Laterality: Right;   Family History  Problem Relation Age of Onset  . Anesthesia problems Neg Hx   . Hypotension Neg Hx   . Malignant hyperthermia Neg Hx   .  Pseudochol deficiency Neg Hx   . Breast cancer Mother   . Heart disease Mother    History  Substance Use Topics  . Smoking status: Never Smoker   . Smokeless tobacco: Never Used  . Alcohol Use: No    Review of Systems  Unable to perform ROS: Mental status change    Allergies  Ace inhibitors; Rocephin; and Beta adrenergic blockers  Home Medications   Current Outpatient Rx  Name  Route  Sig  Dispense  Refill  . allopurinol (ZYLOPRIM) 100 MG tablet   Oral   Take 100 mg by mouth daily.         . Amino Acids-Protein Hydrolys (FEEDING SUPPLEMENT, PRO-STAT SUGAR FREE 64,) LIQD   Oral   Take 30 mLs by mouth 3 (three) times daily with meals.         Marland Kitchen b complex-vitamin c-folic acid (NEPHRO-VITE) 0.8 MG TABS  tablet   Oral   Take 0.8 mg by mouth at bedtime.         . Calcium Carb-Cholecalciferol (CALCIUM 600 + D PO)   Oral   Take 1 tablet by mouth daily.         . digoxin (LANOXIN) 0.125 MG tablet   Oral   Take 0.125 mg by mouth every other day.         . diltiazem (TIAZAC) 120 MG 24 hr capsule   Oral   Take 120 mg by mouth daily.         . famotidine (PEPCID) 20 MG tablet   Oral   Take 20 mg by mouth 2 (two) times daily.         . feeding supplement, GLUCERNA SHAKE, (GLUCERNA SHAKE) LIQD   Oral   Take 237 mLs by mouth 3 (three) times daily between meals.         . ferrous fumarate (HEMOCYTE - 106 MG FE) 325 (106 FE) MG TABS tablet   Oral   Take 1 tablet by mouth 2 (two) times daily.         Marland Kitchen HYDROcodone-acetaminophen (NORCO/VICODIN) 5-325 MG per tablet   Oral   Take 1 tablet by mouth every 4 (four) hours as needed for pain.         Marland Kitchen insulin aspart (NOVOLOG) 100 UNIT/ML injection   Subcutaneous   Inject 2-12 Units into the skin 3 (three) times daily with meals. If sugar is 150-200=2 units, 201-250=4 units, 251-300=6 units, 301-350=6 units, 351-400=10 units, 401-500=12 units, if above 400 call md.         . metoCLOPramide (REGLAN) 10 MG tablet   Oral   Take 10 mg by mouth 3 (three) times daily.         . polyethylene glycol (MIRALAX / GLYCOLAX) packet   Oral   Take 17 g by mouth daily.         . pregabalin (LYRICA) 75 MG capsule   Oral   Take 75 mg by mouth 2 (two) times daily.         Marland Kitchen saccharomyces boulardii (FLORASTOR) 250 MG capsule   Oral   Take 250 mg by mouth 2 (two) times daily.         . vitamin B-12 1000 MCG tablet   Oral   Take 1 tablet (1,000 mcg total) by mouth daily.         . vitamin C (ASCORBIC ACID) 500 MG tablet   Oral   Take 500 mg by mouth 2 (two) times  daily.           Triage Vitals: BP 123/66  Pulse 96  Temp(Src) 97.9 F (36.6 C) (Oral)  Resp 15  SpO2 93%  Physical Exam  Nursing note and vitals  reviewed. Constitutional: He is oriented to person, place, and time. He appears well-developed and well-nourished.  Somnolent  HENT:  Head: Normocephalic and atraumatic.  Oropharynx was clear, MMM  Eyes: Conjunctivae are normal. Right eye exhibits no discharge. Left eye exhibits no discharge. No scleral icterus.  Neck: No tracheal deviation present.  Pain with turning head to right so pt holds his head in a left position   Cardiovascular: Normal rate and regular rhythm.   Very strong pulses to the right radial pulse, frequent PVCs  Pulmonary/Chest: Effort normal. No respiratory distress. He has rales (slight rales that cleared with coughing).  Port-a-cath site on the right, no discharge, no redness  Abdominal: Soft. He exhibits no distension. There is no tenderness. There is no guarding.  Genitourinary:  Skin breakdown in the perianal and gluteal regions, consistent with grade 1 decubitus  Musculoskeletal: Normal range of motion.  Pitting edema up to the proximal right upper extremity and up to the knees bilaterally of the lower extremities, asymmetrical upper extremities with the right being greater than left, pain with ROM of the RUE at the wrist and elbow    Neurological: He is alert and oriented to person, place, and time.  Somnolent but arousable, answers questions appropriately, moves all 4 extremities, speech is clear  Skin: Skin is warm and dry.  Chronic discoloration of the lower extremities to the distal shins, perianal erythema that is consistent with early stage 1 decubitus ulcers, no purulence or foul smell  Psychiatric: He has a normal mood and affect. His behavior is normal.    ED Course  Procedures (including critical care time)  DIAGNOSTIC STUDIES: Oxygen Saturation is 93% on room air, adequate by my interpretation.    COORDINATION OF CARE: 2:32 PM-Rectal temp is 99. Will order CXR, troponin, CBC, CMP and ammonia levels.   Labs Review Labs Reviewed  CBC WITH  DIFFERENTIAL - Abnormal; Notable for the following:    WBC 12.4 (*)    RBC 3.17 (*)    Hemoglobin 8.8 (*)    HCT 27.2 (*)    RDW 18.3 (*)    Neutrophils Relative % 90 (*)    Neutro Abs 11.2 (*)    Lymphocytes Relative 4 (*)    Lymphs Abs 0.6 (*)    All other components within normal limits  COMPREHENSIVE METABOLIC PANEL - Abnormal; Notable for the following:    Sodium 129 (*)    Chloride 91 (*)    Glucose, Bld 112 (*)    BUN 24 (*)    Creatinine, Ser 2.45 (*)    Albumin 2.7 (*)    GFR calc non Af Amer 26 (*)    GFR calc Af Amer 30 (*)    All other components within normal limits  APTT - Abnormal; Notable for the following:    aPTT 38 (*)    All other components within normal limits  URINALYSIS, ROUTINE W REFLEX MICROSCOPIC - Abnormal; Notable for the following:    APPearance CLOUDY (*)    Specific Gravity, Urine >1.030 (*)    Hgb urine dipstick SMALL (*)    Bilirubin Urine SMALL (*)    Ketones, ur TRACE (*)    Protein, ur 30 (*)    Leukocytes, UA MODERATE (*)  All other components within normal limits  URINE MICROSCOPIC-ADD ON - Abnormal; Notable for the following:    Bacteria, UA FEW (*)    All other components within normal limits  URINE CULTURE  PROTIME-INR  LACTIC ACID, PLASMA  AMMONIA  TROPONIN I  MAGNESIUM  PHOSPHORUS  DIGOXIN LEVEL   Imaging Review Dg Chest Port 1 View  11/04/2012   CLINICAL DATA:  Shortness of breath  EXAM: PORTABLE CHEST - 1 VIEW  COMPARISON:  10/17/2012  FINDINGS: Borderline cardiomegaly. No acute infiltrate or pulmonary edema. Right IJ dialysis catheter in place. No diagnostic pneumothorax. Left basilar atelectasis.  IMPRESSION: Borderline cardiomegaly. No active disease.   Electronically Signed   By: Natasha Mead M.D.   On: 11/04/2012 14:55    EKG Interpretation     Ventricular Rate:  75 PR Interval:    QRS Duration: 108 QT Interval:  340 QTC Calculation: 379 R Axis:   -53 Text Interpretation:  Atrial fibrillation Low voltage  QRS Incomplete right bundle branch block Left anterior fascicular block Possible Anterolateral infarct (cited on or before 06-Jan-2011) Abnormal ECG When compared with ECG of 06-Jan-2011 08:58, Atrial fibrillation has replaced Sinus rhythm Left anterior fascicular block is now Present Incomplete right bundle branch block is now Present Questionable change in initial forces of Lateral leads            MDM   1. Altered mental status   2. Localized swelling, mass, or lump of upper extremity, right   3. UTI (lower urinary tract infection)   4. Renal failure    The patient is not febrile, has a slight hypoxia but no oxygen requirement, no tachycardia though he does have frequent PVCs on the monitor. His respirations are unlabored, speaking appropriately though in shortened sentences because of somnolence not respiratory distress. EKG confirms atrial fibrillation with a left axis deviation and poor R-wave progression. Compared with 01/06/2011, this is  Different as the axis is leftward and the poor R wave progression is present.  The patient will need a workup for his altered mental status. His right upper extremity is severely swollen. I have performed a brief bedside ultrasound and dizziness and drainage of the right upper extremity appears to be intact without any deficits incompressibility of the veins. Doppler imaging for flow was not performed. He does have clots in the neck and what appears to be the internal jugular vein. The study was limited by the fact that the patient cannot turn his head straight or to the right.  The patient is on Coumadin.  Pt has possibility of DVT - has catheter that may be nidus - would need lovenox and may best be served at larger facility that can get doppler studies today.  He does have UTI - abx started, lactic acid is normal.  D/w Dr. Janee Morn with [facility] here at [facility] - also recommends that pt be transferred.  Care d/w carelink at 4:15 PM.   Initiate consultation  Discuss care with Dr. Rito Ehrlich on at [facility] who agrees to admit the patient. Transfer orders written   I personally performed the services described in this documentation, which was scribed in my presence. The recorded information has been reviewed and is accurate.       Vida Roller, MD 11/04/12 [phone]

## 2012-11-04 NOTE — H&P (Signed)
PCP:   [name], MD   Chief Complaint:  Right arm pain and swelling, ams  HPI: 67 yo male with recent dx of CLL (july 14) and after his first round of chemotherapy had sepsis requiring over 2 week hospilization at [facility] went into renal failure requiring dialysis.  Has been on dialysis for almost 2 months on t/r/sat.  During that hosp he did not require intubation, was d/c to [facility] then to [facility] last week.  Prior to this illness, he was ambulatory, pretty functional and has had a significant deterioration since.  Over the last several days his right arm has been progressively swelling and very painful.  No rashes.  He has a right IJ cath placed for dialysis on 10/7, he has no other access for dialysis.  Has not had mapping done for perm cath yet.  His daughter and wife are present.  His daughter is an [facility] at [facility] and very knowledgeable about his care.  Last week he also had some swelling in the right arm, an ultrasound was done and did not show a dvt.  Today they were called because he was more confused than normal.  Since the pain in his arm he has been started on norco, and his family thinks this may be contributing to the confusion.  He is normally not easily confused.  Pt was transferred from [facility] ED for further w/u of the swelling.  He was previously on coumadin back in aug and suffered from many bleeding issues, mainly very large hematomas had developed on buttocks and legs requiring multiple blood transfusions, his coumadin was stopped at this time.  ?? Had UGIB during that time.  He has bed bound since his illness started in July.  He has had no n/v/d.  No sob.  Eating ok.  No fevers.  Again no rash including right arm.  IJ right looks good.  He has had also recent pseudomonas uti, completed course of iv vanco last week, had a PICC in right arm which has been pulled since.  He does still make minimal urine.  Review of Systems:  Positive and negative as  per HPI otherwise all other systems are negative  Past Medical History: Past Medical History  Diagnosis Date  . Arthritis   . Hypertension   . DJD (degenerative joint disease)   . DJD (degenerative joint disease), ankle and foot   . Arthritis   . Cellulitis   . Arthritis   . Chronic back pain   . Gout   . Dysrhythmia     afibrillation  . Atrial fibrillation   . Chronic kidney disease   . Colon cancer     2005  . 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 Charcot ankle     dr. Emilio Math  . Pneumonia   . Bacteremia    Past Surgical History  Procedure Laterality Date  . Foot surgery      right foot-  . Colon surgery      forcolon cancer, 2005, Dr. Marcell Anger in [place]  . Tonsillectomy    . Cystoscopy with urethral dilatation    . Back surgery      x2  . Cataract extraction w/phaco  01/14/2011    Procedure: CATARACT EXTRACTION PHACO AND INTRAOCULAR LENS PLACEMENT (IOC);  Surgeon: Gemma Payor;  Location: [facility];  Service: Ophthalmology;  Laterality: Left;  CDE=16.27  . Lymph node biopsy Right 07/11/2012    Procedure: CERVICAL LYMPH  NODE BIOPSY;  Surgeon: Marlane Hatcher, MD;  Location: [facility];  Service: General;  Laterality: Right;  end @ 1101  . Portacath placement Right 07/11/2012    Procedure: INSERTION PORT-A-CATH;  Surgeon: Marlane Hatcher, MD;  Location: [facility];  Service: General;  Laterality: Right;  . Port-a-cath removal Right 08/05/2012    Procedure: REMOVAL PORT-A-CATH;  Surgeon: Shelly Rubenstein, MD;  Location: [facility] OR;  Service: General;  Laterality: Right;    Medications: Prior to Admission medications   Medication Sig Start Date End Date Taking? Authorizing Provider  allopurinol (ZYLOPRIM) 100 MG tablet Take 100 mg by mouth daily.   Yes Historical Provider, MD  Amino Acids-Protein Hydrolys (FEEDING SUPPLEMENT, PRO-STAT SUGAR FREE 64,) LIQD Take 30 mLs by mouth 3 (three) times daily with meals.   Yes Historical Provider, MD  b complex-vitamin c-folic acid  (NEPHRO-VITE) 0.8 MG TABS tablet Take 0.8 mg by mouth at bedtime.   Yes Historical Provider, MD  Calcium Carb-Cholecalciferol (CALCIUM 600 + D PO) Take 1 tablet by mouth daily.   Yes Historical Provider, MD  digoxin (LANOXIN) 0.125 MG tablet Take 0.125 mg by mouth every other day.   Yes Historical Provider, MD  diltiazem (TIAZAC) 120 MG 24 hr capsule Take 120 mg by mouth daily.   Yes Historical Provider, MD  famotidine (PEPCID) 20 MG tablet Take 20 mg by mouth 2 (two) times daily.   Yes Historical Provider, MD  feeding supplement, GLUCERNA SHAKE, (GLUCERNA SHAKE) LIQD Take 237 mLs by mouth 3 (three) times daily between meals.   Yes Historical Provider, MD  ferrous fumarate (HEMOCYTE - 106 MG FE) 325 (106 FE) MG TABS tablet Take 1 tablet by mouth 2 (two) times daily.   Yes Historical Provider, MD  HYDROcodone-acetaminophen (NORCO/VICODIN) 5-325 MG per tablet Take 1 tablet by mouth every 4 (four) hours as needed for pain.   Yes Historical Provider, MD  insulin aspart (NOVOLOG) 100 UNIT/ML injection Inject 2-12 Units into the skin 3 (three) times daily with meals. If sugar is 150-200=2 units, 201-250=4 units, 251-300=6 units, 301-350=6 units, 351-400=10 units, 401-500=12 units, if above 400 call md.   Yes Historical Provider, MD  metoCLOPramide (REGLAN) 10 MG tablet Take 10 mg by mouth 3 (three) times daily.   Yes Historical Provider, MD  polyethylene glycol (MIRALAX / GLYCOLAX) packet Take 17 g by mouth daily.   Yes Historical Provider, MD  pregabalin (LYRICA) 75 MG capsule Take 75 mg by mouth 2 (two) times daily.   Yes Historical Provider, MD  saccharomyces boulardii (FLORASTOR) 250 MG capsule Take 250 mg by mouth 2 (two) times daily.   Yes Historical Provider, MD  vitamin B-12 1000 MCG tablet Take 1 tablet (1,000 mcg total) by mouth daily. 08/17/12  Yes Calvert Cantor, MD  vitamin C (ASCORBIC ACID) 500 MG tablet Take 500 mg by mouth 2 (two) times daily.    Yes Historical Provider, MD    Allergies:    Allergies  Allergen Reactions  . Ace Inhibitors Other (See Comments)    cough  . Rocephin [Ceftriaxone Sodium] Hives  . Beta Adrenergic Blockers Rash    Social History:  reports that he has never smoked. He has never used smokeless tobacco. He reports that he does not drink alcohol or use illicit drugs.  Family History: Family History  Problem Relation Age of Onset  . Anesthesia problems Neg Hx   . Hypotension Neg Hx   . Malignant hyperthermia Neg Hx   . Pseudochol deficiency  Neg Hx   . Breast cancer Mother   . Heart disease Mother     Physical Exam: Filed Vitals:   11/04/12 1700 11/04/12 1800 11/04/12 1823 11/04/12 2023  BP: 108/65 98/50 104/67 129/67  Pulse: 76 36 77 69  Temp:    98.9 F (37.2 C)  TempSrc:    Oral  Resp: 9 12 14 15   Height:      Weight:    120.6 kg (265 lb 14 oz)  SpO2: 100% 100% 100% 94%   General appearance: alert, cooperative and no distress chronically ill appearing Head: Normocephalic, without obvious abnormality, atraumatic Eyes: negative Nose: Nares normal. Septum midline. Mucosa normal. No drainage or sinus tenderness. Neck: no JVD and supple, symmetrical, trachea midline Lungs: clear to auscultation bilaterally Heart: regular rate and rhythm, S1, S2 normal, no murmur, click, rub or gallop  IJ to right c/d/i no swelling Abdomen: soft, non-tender; bowel sounds normal; no masses,  no organomegaly Extremities: extremities normal, atraumatic, no cyanosis or edema diffuse rue swelling and pain,  Pulses: 2+ and symmetric Skin: Skin color, texture, turgor normal. No rashes or lesions Neurologic: Grossly normal   Labs on Admission:   Recent Labs  11/04/12 1431  NA 129*  K 3.5  CL 91*  CO2 24  GLUCOSE 112*  BUN 24*  CREATININE 2.45*  CALCIUM 9.4  MG 1.8  PHOS 2.9    Recent Labs  11/04/12 1431  AST 14  ALT 9  ALKPHOS 90  BILITOT 0.5  PROT 6.9  ALBUMIN 2.7*    Recent Labs  11/04/12 1431  WBC 12.4*  NEUTROABS 11.2*   HGB 8.8*  HCT 27.2*  MCV 85.8  PLT 192    Recent Labs  11/04/12 1431  TROPONINI <0.30   Radiological Exams on Admission: Ir Fluoro Guide Cv Line Right  10/17/2012   CLINICAL DATA:  Renal failure and need for tunneled hemodialysis catheter for continued dialysis needs. Patient is currently dialyzed via a temporary left jugular dialysis catheter.  EXAM: TUNNELED CENTRAL VENOUS HEMODIALYSIS CATHETER PLACEMENT WITH ULTRASOUND AND FLUOROSCOPIC GUIDANCE  MEDICATIONS: The patient had received scheduled dosing of 1.5 g of IV vancomycin just prior to the procedure. IV antibiotic was given in an appropriate time interval prior to skin puncture.  ANESTHESIA/SEDATION: 2.0 mg IV Versed; 100 mcg IV Fentanyl.  Total Moderate Sedation Time  Twenty minutes.  FLUOROSCOPY TIME:  48 seconds.  PROCEDURE: The procedure, risks, benefits, and alternatives were explained to the patient. Questions regarding the procedure were encouraged and answered. The patient understands and consents to the procedure.  The right neck and chest were prepped with chlorhexidine in a sterile fashion, and a sterile drape was applied covering the operative field. Maximum barrier sterile technique with sterile gowns and gloves were used for the procedure. Local anesthesia was provided with 1% lidocaine.  Ultrasound was used to confirm patency of the right internal jugular vein. After creating a small venotomy incision, a 21 gauge needle was advanced into the right internal jugular vein under direct, real-time ultrasound guidance. Ultrasound image documentation was performed. After securing guidewire access, an 8 Fr dilator was placed. A J-wire was kinked to measure appropriate catheter length.  A Bard HemoSplit tunneled hemodialysis catheter measuring 23 cm from tip to cuff was chosen for placement. This was tunneled in a retrograde fashion from the chest wall to the venotomy incision.  At the venotomy, serial dilatation was performed and a 16 Fr  peel-away sheath was placed over a guidewire.  The catheter was then placed through the sheath and the sheath removed. Final catheter positioning was confirmed and documented with a fluoroscopic spot image. The catheter was aspirated, flushed with saline, and injected with appropriate volume heparin dwells.  The venotomy incision was closed with subcutaneous 3-0 Monocryl and subcuticular 4-0 Vicryl. Dermabond was applied to the incision. The catheter exit site was secured with 0-Prolene retention sutures.  COMPLICATIONS: None.  No pneumothorax.  FINDINGS: After catheter placement, the tips lie in the right atrium. The catheter aspirates normally and is ready for immediate use.  IMPRESSION: Placement of tunneled hemodialysis catheter via the right internal jugular vein. The catheter tips lie in the right atrium. The catheter is ready for immediate use.   Electronically Signed   By: Irish Lack M.D.   On: 10/17/2012 16:44   Ir US Guide Vasc Access Right  10/17/2012   CLINICAL DATA:  Renal failure and need for tunneled hemodialysis catheter for continued dialysis needs. Patient is currently dialyzed via a temporary left jugular dialysis catheter.  EXAM: TUNNELED CENTRAL VENOUS HEMODIALYSIS CATHETER PLACEMENT WITH ULTRASOUND AND FLUOROSCOPIC GUIDANCE  MEDICATIONS: The patient had received scheduled dosing of 1.5 g of IV vancomycin just prior to the procedure. IV antibiotic was given in an appropriate time interval prior to skin puncture.  ANESTHESIA/SEDATION: 2.0 mg IV Versed; 100 mcg IV Fentanyl.  Total Moderate Sedation Time  Twenty minutes.  FLUOROSCOPY TIME:  48 seconds.  PROCEDURE: The procedure, risks, benefits, and alternatives were explained to the patient. Questions regarding the procedure were encouraged and answered. The patient understands and consents to the procedure.  The right neck and chest were prepped with chlorhexidine in a sterile fashion, and a sterile drape was applied covering the operative  field. Maximum barrier sterile technique with sterile gowns and gloves were used for the procedure. Local anesthesia was provided with 1% lidocaine.  Ultrasound was used to confirm patency of the right internal jugular vein. After creating a small venotomy incision, a 21 gauge needle was advanced into the right internal jugular vein under direct, real-time ultrasound guidance. Ultrasound image documentation was performed. After securing guidewire access, an 8 Fr dilator was placed. A J-wire was kinked to measure appropriate catheter length.  A Bard HemoSplit tunneled hemodialysis catheter measuring 23 cm from tip to cuff was chosen for placement. This was tunneled in a retrograde fashion from the chest wall to the venotomy incision.  At the venotomy, serial dilatation was performed and a 16 Fr peel-away sheath was placed over a guidewire. The catheter was then placed through the sheath and the sheath removed. Final catheter positioning was confirmed and documented with a fluoroscopic spot image. The catheter was aspirated, flushed with saline, and injected with appropriate volume heparin dwells.  The venotomy incision was closed with subcutaneous 3-0 Monocryl and subcuticular 4-0 Vicryl. Dermabond was applied to the incision. The catheter exit site was secured with 0-Prolene retention sutures.  COMPLICATIONS: None.  No pneumothorax.  FINDINGS: After catheter placement, the tips lie in the right atrium. The catheter aspirates normally and is ready for immediate use.  IMPRESSION: Placement of tunneled hemodialysis catheter via the right internal jugular vein. The catheter tips lie in the right atrium. The catheter is ready for immediate use.   Electronically Signed   By: Irish Lack M.D.   On: 10/17/2012 16:44   Dg Chest Port 1 View  11/04/2012   CLINICAL DATA:  Shortness of breath  EXAM: PORTABLE CHEST - 1 VIEW  COMPARISON:  10/17/2012  FINDINGS: Borderline cardiomegaly. No acute infiltrate or pulmonary  edema. Right IJ dialysis catheter in place. No diagnostic pneumothorax. Left basilar atelectasis.  IMPRESSION: Borderline cardiomegaly. No active disease.   Electronically Signed   By: Natasha Mead M.D.   On: 11/04/2012 14:55    Assessment/Plan  67 yo male ESRD dialysis t/r/sat with probable right UE DVT with rt tunneled HD cath in IJ and possible UTI  Principal Problem:   Swelling of arm pt has been covered with full dose lovenox.  U/s rue has been ordered to ck for DVT.  Change norco to low dose oxycodone.    Active Problems:   Morbid obesity   Venous stasis dermatitis   A-fib   CLL (chronic lymphocytic leukemia)   ESRD on dialysis   UTI (urinary tract infection) ck previous blood and urine cultures.  Mult recent urine cultures have been neg.  Last month had pseudomonas, pt allergic to rocephin.  Last cx sens indeterminate with cipr, sensitive to zosyn, will place on zosyn iv pending culture.   Upper right diffuse extremity pain   Altered mental status prob secondary to pain meds +/- uti  Pt is FULL code.  Family updated at bedside.  Admit to tele bed.    Torian Thoennes A 11/04/2012, 10:26 PM

## 2012-11-05 ENCOUNTER — Encounter (HOSPITAL_COMMUNITY): Payer: Self-pay | Admitting: *Deleted

## 2012-11-05 ENCOUNTER — Inpatient Hospital Stay (HOSPITAL_COMMUNITY): Payer: Medicare Other

## 2012-11-05 DIAGNOSIS — R229 Localized swelling, mass and lump, unspecified: Secondary | ICD-10-CM

## 2012-11-05 DIAGNOSIS — M7989 Other specified soft tissue disorders: Secondary | ICD-10-CM

## 2012-11-05 DIAGNOSIS — M79609 Pain in unspecified limb: Secondary | ICD-10-CM

## 2012-11-05 LAB — BASIC METABOLIC PANEL
BUN: 32 mg/dL — ABNORMAL HIGH (ref 6–23)
Calcium: 8.8 mg/dL (ref 8.4–10.5)
Chloride: 97 mEq/L (ref 96–112)
GFR calc Af Amer: 23 mL/min — ABNORMAL LOW (ref >90)
Glucose, Bld: 99 mg/dL (ref 70–99)
Potassium: 3.2 mEq/L — ABNORMAL LOW (ref 3.5–5.1)
Sodium: 132 mEq/L — ABNORMAL LOW (ref 135–145)

## 2012-11-05 LAB — CBC
HCT: 24.6 % — ABNORMAL LOW (ref 39.0–52.0)
Hemoglobin: 8 g/dL — ABNORMAL LOW (ref 13.0–17.0)
MCH: 27.6 pg (ref 26.0–34.0)
MCHC: 32.5 g/dL (ref 30.0–36.0)
MCV: 84.8 fL (ref 78.0–100.0)
Platelets: 172 10*3/uL (ref 150–400)
RBC: 2.9 MIL/uL — ABNORMAL LOW (ref 4.22–5.81)
RDW: 18.4 % — ABNORMAL HIGH (ref 11.5–15.5)
WBC: 7.7 10*3/uL (ref 4.0–10.5)

## 2012-11-05 LAB — PROTIME-INR
INR: 1.17 (ref 0.00–1.49)
Prothrombin Time: 14.7 s (ref 11.6–15.2)

## 2012-11-05 LAB — GLUCOSE, CAPILLARY: Glucose-Capillary: 137 mg/dL — ABNORMAL HIGH (ref 70–99)

## 2012-11-05 MED ORDER — PIPERACILLIN-TAZOBACTAM IN DEX 2-0.25 GM/50ML IV SOLN
2.2500 g | Freq: Three times a day (TID) | INTRAVENOUS | Status: DC
  Administered 2012-11-05 – 2012-11-06 (×4): 2.25 g via INTRAVENOUS
  Filled 2012-11-05 (×6): qty 50

## 2012-11-05 MED ORDER — POTASSIUM CHLORIDE CRYS ER 20 MEQ PO TBCR
40.0000 meq | EXTENDED_RELEASE_TABLET | Freq: Two times a day (BID) | ORAL | Status: DC
  Administered 2012-11-05 – 2012-11-06 (×3): 40 meq via ORAL
  Filled 2012-11-05 (×4): qty 2

## 2012-11-05 MED ORDER — PIPERACILLIN-TAZOBACTAM 3.375 G IVPB
3.3750 g | Freq: Three times a day (TID) | INTRAVENOUS | Status: DC
  Filled 2012-11-05 (×3): qty 50

## 2012-11-05 NOTE — Progress Notes (Signed)
[facility]                                                                                Patient Demographics  Alex Petty, is a 67 y.o. male, DOB - 12/22/1945, NGE:[id number]  Admit date - 11/04/2012   Admitting Physician Osvaldo Shipper, MD  Outpatient Primary MD for the patient is [name], MD  LOS - 1   Chief Complaint  Patient presents with  . Weakness  . Arm Swelling  . Altered Mental Status        Assessment & Plan    Principal Problem:   Swelling of arm Active Problems:   Morbid obesity   Venous stasis dermatitis   A-fib   CLL (chronic lymphocytic leukemia)   ESRD on dialysis   UTI (urinary tract infection)   Upper right diffuse extremity pain   Altered mental status  Swelling of Right Upper Extremity with pain -Pending all right upper portion of the ultrasound to assess for DVT. -Will continue full dose Lovenox. -Will continue pain control.  Encephalopathy secondary to UTI -Leukocytosis trending downward. -Pending blood and urine cultures. -Patient did have Klebsiella as well as enterococcus noted on previous cultures in September 2014. -Will continue Zosyn. Patient does have a ceftriaxone allergy -Daughter states he has been "in and out of it since July, however sometimes is alert and oriented." He started chemotherapy in July.  Atrial Fibrillation -Currently rate controlled. Continue digoxin, diltiazem.  CLL -Currently not undergoing treatment.  Patient had one round of chemotherapy back in July, at which point he became septic following that. Patient has not been seen by an oncologist since.  ESRD requiring hemodialysis -Will consult nephrology  Venous stasis dermatitis -Stable  Code Status: Full  Family Communication: Daughter via phone.  Disposition Plan: Admitted   Procedures  Pending Upper extremity ultrasound  Consults   Nephrology  DVT Prophylaxis  Lovenox   Lab Results  Component Value Date   PLT 172  11/05/2012    Medications  Scheduled Meds: . allopurinol  100 mg Oral Daily  . digoxin  0.125 mg Oral QODAY  . diltiazem  120 mg Oral Daily  . feeding supplement (GLUCERNA SHAKE)  237 mL Oral TID BM  . feeding supplement (PRO-STAT SUGAR FREE 64)  30 mL Oral TID WC  . insulin aspart  0-9 Units Subcutaneous TID WC  . metoCLOPramide  10 mg Oral TID  . piperacillin-tazobactam (ZOSYN)  IV  2.25 g Intravenous Q8H  . polyethylene glycol  17 g Oral Daily  . pregabalin  75 mg Oral BID  . sodium chloride  3 mL Intravenous Q12H  . sodium chloride  3 mL Intravenous Q12H  . vitamin B-12  1,000 mcg Oral Daily  . vitamin C  500 mg Oral BID   Continuous Infusions:  PRN Meds:.sodium chloride, acetaminophen, acetaminophen, alum & mag hydroxide-simeth, ondansetron (ZOFRAN) IV, ondansetron, oxyCODONE, sodium chloride  Antibiotics    Anti-infectives   Start     Dose/Rate Route Frequency Ordered Stop   11/05/12 0600  piperacillin-tazobactam (ZOSYN) IVPB 3.375 g  Status:  Discontinued     3.375 g 12.5 mL/hr over 240 Minutes Intravenous Every 8 hours  11/04/12 2326 11/04/12 2331   11/05/12 0400  ciprofloxacin (CIPRO) IVPB 400 mg  Status:  Discontinued     400 mg 200 mL/hr over 60 Minutes Intravenous Every 12 hours 11/04/12 2223 11/04/12 2256   11/04/12 2345  piperacillin-tazobactam (ZOSYN) IVPB 2.25 g     2.25 g 100 mL/hr over 30 Minutes Intravenous 3 times per day 11/04/12 2332     11/04/12 2330  piperacillin-tazobactam (ZOSYN) IVPB 3.375 g  Status:  Discontinued     3.375 g 100 mL/hr over 30 Minutes Intravenous  Once 11/04/12 2326 11/05/12 0003   11/04/12 1600  ciprofloxacin (CIPRO) IVPB 400 mg     400 mg 200 mL/hr over 60 Minutes Intravenous  Once 11/04/12 1549 11/04/12 1702       Time Spent in minutes   35 minutes   Matai Carpenito D.O. on 11/05/2012 at 7:19 AM  Between 7am to 7pm - Pager - [phone]  After 7pm go to [email or web address] - password [email or web address]  And look for the night  coverage person covering for me after hours  [facility] Office  [phone]    Subjective:   [name] seen and examined today.  Patient has no complaints today. However does state that he is "not sure." Patient denies dizziness, chest pain, shortness of breath, abdominal pain, N/V/D/C, new weakness, numbess, tingling.    Objective:   Filed Vitals:   11/04/12 1800 11/04/12 1823 11/04/12 2023 11/05/12 0621  BP: 98/50 104/67 129/67 102/54  Pulse: 36 77 69 70  Temp:   98.9 F (37.2 C) 98.4 F (36.9 C)  TempSrc:   Oral Oral  Resp: 12 14 15 16   Height:      Weight:   120.6 kg (265 lb 14 oz)   SpO2: 100% 100% 94% 100%    Wt Readings from Last 3 Encounters:  11/04/12 120.6 kg (265 lb 14 oz)  08/17/12 155.6 kg (343 lb 0.6 oz)  08/17/12 155.6 kg (343 lb 0.6 oz)    No intake or output data in the 24 hours ending 11/05/12 0719  Exam  General: Well developed, well nourished, NAD, appears stated age  HEENT: NCAT, PERRLA, EOMI, Anicteic Sclera, mucous membranes moist. No pharyngeal erythema or exudates  Neck: Supple, no JVD, no masses,  Cardiovascular: S1 S2 auscultated, no rubs, murmurs or gallops. Irregularly irregular. Right IJ in place.  Respiratory: Clear to auscultation bilaterally with equal chest rise  Abdomen: Soft, obese, nontender, nondistended, + bowel sounds  Extremities: warm dry without cyanosis clubbing or edema to the lower extremities.  RUE swelling.  Neuro: AAOx3, cranial nerves grossly intact.  Skin: Without rashes exudates or nodules  Data Review   Micro Results No results found for this or any previous visit (from the past 240 hour(s)).  Radiology Reports Ir Fluoro Guide Cv Line Right  10/17/2012   CLINICAL DATA:  Renal failure and need for tunneled hemodialysis catheter for continued dialysis needs. Patient is currently dialyzed via a temporary left jugular dialysis catheter.  EXAM: TUNNELED CENTRAL VENOUS HEMODIALYSIS CATHETER  PLACEMENT WITH ULTRASOUND AND FLUOROSCOPIC GUIDANCE  MEDICATIONS: The patient had received scheduled dosing of 1.5 g of IV vancomycin just prior to the procedure. IV antibiotic was given in an appropriate time interval prior to skin puncture.  ANESTHESIA/SEDATION: 2.0 mg IV Versed; 100 mcg IV Fentanyl.  Total Moderate Sedation Time  Twenty minutes.  FLUOROSCOPY TIME:  48 seconds.  PROCEDURE: The procedure, risks, benefits, and alternatives were explained to the patient. Questions regarding  the procedure were encouraged and answered. The patient understands and consents to the procedure.  The right neck and chest were prepped with chlorhexidine in a sterile fashion, and a sterile drape was applied covering the operative field. Maximum barrier sterile technique with sterile gowns and gloves were used for the procedure. Local anesthesia was provided with 1% lidocaine.  Ultrasound was used to confirm patency of the right internal jugular vein. After creating a small venotomy incision, a 21 gauge needle was advanced into the right internal jugular vein under direct, real-time ultrasound guidance. Ultrasound image documentation was performed. After securing guidewire access, an 8 Fr dilator was placed. A J-wire was kinked to measure appropriate catheter length.  A Bard HemoSplit tunneled hemodialysis catheter measuring 23 cm from tip to cuff was chosen for placement. This was tunneled in a retrograde fashion from the chest wall to the venotomy incision.  At the venotomy, serial dilatation was performed and a 16 Fr peel-away sheath was placed over a guidewire. The catheter was then placed through the sheath and the sheath removed. Final catheter positioning was confirmed and documented with a fluoroscopic spot image. The catheter was aspirated, flushed with saline, and injected with appropriate volume heparin dwells.  The venotomy incision was closed with subcutaneous 3-0 Monocryl and subcuticular 4-0 Vicryl. Dermabond was  applied to the incision. The catheter exit site was secured with 0-Prolene retention sutures.  COMPLICATIONS: None.  No pneumothorax.  FINDINGS: After catheter placement, the tips lie in the right atrium. The catheter aspirates normally and is ready for immediate use.  IMPRESSION: Placement of tunneled hemodialysis catheter via the right internal jugular vein. The catheter tips lie in the right atrium. The catheter is ready for immediate use.   Electronically Signed   By: Irish Lack M.D.   On: 10/17/2012 16:44   Ir US Guide Vasc Access Right  10/17/2012   CLINICAL DATA:  Renal failure and need for tunneled hemodialysis catheter for continued dialysis needs. Patient is currently dialyzed via a temporary left jugular dialysis catheter.  EXAM: TUNNELED CENTRAL VENOUS HEMODIALYSIS CATHETER PLACEMENT WITH ULTRASOUND AND FLUOROSCOPIC GUIDANCE  MEDICATIONS: The patient had received scheduled dosing of 1.5 g of IV vancomycin just prior to the procedure. IV antibiotic was given in an appropriate time interval prior to skin puncture.  ANESTHESIA/SEDATION: 2.0 mg IV Versed; 100 mcg IV Fentanyl.  Total Moderate Sedation Time  Twenty minutes.  FLUOROSCOPY TIME:  48 seconds.  PROCEDURE: The procedure, risks, benefits, and alternatives were explained to the patient. Questions regarding the procedure were encouraged and answered. The patient understands and consents to the procedure.  The right neck and chest were prepped with chlorhexidine in a sterile fashion, and a sterile drape was applied covering the operative field. Maximum barrier sterile technique with sterile gowns and gloves were used for the procedure. Local anesthesia was provided with 1% lidocaine.  Ultrasound was used to confirm patency of the right internal jugular vein. After creating a small venotomy incision, a 21 gauge needle was advanced into the right internal jugular vein under direct, real-time ultrasound guidance. Ultrasound image documentation was  performed. After securing guidewire access, an 8 Fr dilator was placed. A J-wire was kinked to measure appropriate catheter length.  A Bard HemoSplit tunneled hemodialysis catheter measuring 23 cm from tip to cuff was chosen for placement. This was tunneled in a retrograde fashion from the chest wall to the venotomy incision.  At the venotomy, serial dilatation was performed and a 16 Fr peel-away  sheath was placed over a guidewire. The catheter was then placed through the sheath and the sheath removed. Final catheter positioning was confirmed and documented with a fluoroscopic spot image. The catheter was aspirated, flushed with saline, and injected with appropriate volume heparin dwells.  The venotomy incision was closed with subcutaneous 3-0 Monocryl and subcuticular 4-0 Vicryl. Dermabond was applied to the incision. The catheter exit site was secured with 0-Prolene retention sutures.  COMPLICATIONS: None.  No pneumothorax.  FINDINGS: After catheter placement, the tips lie in the right atrium. The catheter aspirates normally and is ready for immediate use.  IMPRESSION: Placement of tunneled hemodialysis catheter via the right internal jugular vein. The catheter tips lie in the right atrium. The catheter is ready for immediate use.   Electronically Signed   By: Irish Lack M.D.   On: 10/17/2012 16:44   Dg Chest Port 1 View  11/04/2012   CLINICAL DATA:  Shortness of breath  EXAM: PORTABLE CHEST - 1 VIEW  COMPARISON:  10/17/2012  FINDINGS: Borderline cardiomegaly. No acute infiltrate or pulmonary edema. Right IJ dialysis catheter in place. No diagnostic pneumothorax. Left basilar atelectasis.  IMPRESSION: Borderline cardiomegaly. No active disease.   Electronically Signed   By: Natasha Mead M.D.   On: 11/04/2012 14:55    CBC  Recent Labs Lab 11/04/12 1431 11/05/12 0609  WBC 12.4* 7.7  HGB 8.8* 8.0*  HCT 27.2* 24.6*  PLT 192 172  MCV 85.8 84.8  MCH 27.8 27.6  MCHC 32.4 32.5  RDW 18.3* 18.4*   LYMPHSABS 0.6*  --   MONOABS 0.6  --   EOSABS 0.0  --   BASOSABS 0.0  --     Chemistries   Recent Labs Lab 11/04/12 1431  NA 129*  K 3.5  CL 91*  CO2 24  GLUCOSE 112*  BUN 24*  CREATININE 2.45*  CALCIUM 9.4  MG 1.8  AST 14  ALT 9  ALKPHOS 90  BILITOT 0.5   ------------------------------------------------------------------------------------------------------------------ estimated creatinine clearance is 37.5 ml/min (by C-G formula based on Cr of 2.45). ------------------------------------------------------------------------------------------------------------------ No results found for this basename: HGBA1C,  in the last 72 hours ------------------------------------------------------------------------------------------------------------------ No results found for this basename: CHOL, HDL, LDLCALC, TRIG, CHOLHDL, LDLDIRECT,  in the last 72 hours ------------------------------------------------------------------------------------------------------------------ No results found for this basename: TSH, T4TOTAL, FREET3, T3FREE, THYROIDAB,  in the last 72 hours ------------------------------------------------------------------------------------------------------------------ No results found for this basename: VITAMINB12, FOLATE, FERRITIN, TIBC, IRON, RETICCTPCT,  in the last 72 hours  Coagulation profile  Recent Labs Lab 11/04/12 1431  INR 1.16    No results found for this basename: DDIMER,  in the last 72 hours  Cardiac Enzymes  Recent Labs Lab 11/04/12 1431  TROPONINI <0.30   ------------------------------------------------------------------------------------------------------------------ No components found with this basename: POCBNP,

## 2012-11-05 NOTE — Progress Notes (Signed)
VASCULAR LAB PRELIMINARY  PRELIMINARY  PRELIMINARY  PRELIMINARY  Right upper extremity venous Doppler completed.    Preliminary report:  There is no DVT or SVT noted in the right upper extremity.  No change since study done 10-14-12.  Giovanne Nickolson, RVT 11/05/2012, 6:16 PM

## 2012-11-06 DIAGNOSIS — R7881 Bacteremia: Secondary | ICD-10-CM

## 2012-11-06 DIAGNOSIS — M519 Unspecified thoracic, thoracolumbar and lumbosacral intervertebral disc disorder: Secondary | ICD-10-CM

## 2012-11-06 LAB — BASIC METABOLIC PANEL
BUN: 47 mg/dL — ABNORMAL HIGH (ref 6–23)
Calcium: 8.9 mg/dL (ref 8.4–10.5)
Chloride: 98 mEq/L (ref 96–112)
GFR calc Af Amer: 18 mL/min — ABNORMAL LOW (ref >90)
Glucose, Bld: 117 mg/dL — ABNORMAL HIGH (ref 70–99)
Potassium: 4.1 mEq/L (ref 3.5–5.1)
Sodium: 133 mEq/L — ABNORMAL LOW (ref 135–145)

## 2012-11-06 LAB — CBC
HCT: 23.4 % — ABNORMAL LOW (ref 39.0–52.0)
Hemoglobin: 7.7 g/dL — ABNORMAL LOW (ref 13.0–17.0)
RDW: 18.2 % — ABNORMAL HIGH (ref 11.5–15.5)
WBC: 8.3 10*3/uL (ref 4.0–10.5)

## 2012-11-06 LAB — PREPARE RBC (CROSSMATCH)

## 2012-11-06 LAB — GLUCOSE, CAPILLARY
Glucose-Capillary: 128 mg/dL — ABNORMAL HIGH (ref 70–99)
Glucose-Capillary: 120 mg/dL — ABNORMAL HIGH (ref 70–99)

## 2012-11-06 LAB — URINE CULTURE

## 2012-11-06 MED ORDER — FAMOTIDINE 20 MG PO TABS: 20.0000 mg | ORAL_TABLET | Freq: Two times a day (BID) | ORAL | Status: AC

## 2012-11-06 MED ORDER — ONDANSETRON HCL 4 MG PO TABS: 4.0000 mg | ORAL_TABLET | Freq: Four times a day (QID) | ORAL | Status: DC | PRN

## 2012-11-06 MED ORDER — ACETAMINOPHEN 325 MG PO TABS: 650.0000 mg | ORAL_TABLET | Freq: Four times a day (QID) | ORAL | Status: DC | PRN

## 2012-11-06 MED ORDER — AMOXICILLIN-POT CLAVULANATE 500-125 MG PO TABS: 1.0000 | ORAL_TABLET | Freq: Two times a day (BID) | ORAL | Status: DC

## 2012-11-06 MED ORDER — OXYCODONE HCL 5 MG PO TABS: 5.0000 mg | ORAL_TABLET | ORAL | Status: DC | PRN

## 2012-11-06 MED ORDER — PREDNISONE 20 MG PO TABS
20.0000 mg | ORAL_TABLET | Freq: Every day | ORAL | Status: DC
  Filled 2012-11-06: qty 1

## 2012-11-06 MED ORDER — PREDNISONE 20 MG PO TABS: 20.0000 mg | ORAL_TABLET | Freq: Every day | ORAL | Status: AC

## 2012-11-06 MED ORDER — ENOXAPARIN SODIUM 60 MG/0.6ML ~~LOC~~ SOLN
60.0000 mg | SUBCUTANEOUS | Status: DC
  Administered 2012-11-06: 11:00:00 60 mg via SUBCUTANEOUS
  Filled 2012-11-06: qty 0.6

## 2012-11-06 MED ORDER — ALUM & MAG HYDROXIDE-SIMETH 200-200-20 MG/5ML PO SUSP: 30.0000 mL | Freq: Four times a day (QID) | ORAL | Status: DC | PRN

## 2012-11-06 MED ORDER — COLCHICINE 0.6 MG PO TABS
0.6000 mg | ORAL_TABLET | Freq: Once | ORAL | Status: AC
  Administered 2012-11-06: 0.6 mg via ORAL
  Filled 2012-11-06: qty 1

## 2012-11-06 NOTE — Progress Notes (Signed)
Report given to RN at [facility], pt to return tonight. Will notify night RN to notify daughter when ambulance arrives.

## 2012-11-06 NOTE — Clinical Social Work Psychosocial (Addendum)
Clinical Social Work Department BRIEF PSYCHOSOCIAL ASSESSMENT AND DISCHARGE NOTE 11/06/2012  Patient:  Alex Petty, Alex Petty     Account Number:  [id number]     Admit date:  11/04/2012  Clinical Social Worker:  Delmer Islam  Date/Time:  11/06/2012 12:00 M  Referred by:  Physician  Date Referred:  11/06/2012 Referred for  SNF Placement   Other Referral:   Interview type:  Family Other interview type:   CSW talked with patient's wife Alex Petty    PSYCHOSOCIAL DATA Living Status:  WIFE Admitted from facility:   Level of care:   Primary support name:  Alex Petty Primary support relationship to patient:  SPOUSE Degree of support available:   Patient also has a daughter Alex Petty. CSW talked with wife at the bedside.    CURRENT CONCERNS Current Concerns  Post-Acute Placement   Other Concerns:    SOCIAL WORK ASSESSMENT / PLAN CSW talked with Alex Petty at the bedside to confirm their intent to return to [facility]. Alex Petty advised that he will return to [facility] and requested he be transported by ambulance.   Assessment/plan status:  No Further Intervention Required Other assessment/ plan:   Information/referral to community resources:    PATIENT'S/FAMILY'S RESPONSE TO PLAN OF CARE: Alex Petty was very pleasant and open to talking with CSW. She expressed appreciation for CSW's assistance with discharge back to skilled facility.  DISCHARGE NOTE: Discharge summary forwarded to facility and medical packet compiled. After receiving blood, patient will discharge back to SNF via ambulance.

## 2012-11-06 NOTE — Progress Notes (Signed)
Pt prepared for D/C back to [facility]. IV removed. Tele removed. Placed in paper gown. Wearing upper dentures. Mepilex placed on bottom, multiple excoriated bleeding areas. Assessment unchanged from morning.

## 2012-11-06 NOTE — Discharge Summary (Signed)
Physician Discharge Summary  Alex Petty:[id number] DOB: 15-Feb-1945 DOA: 11/04/2012  PCP: Alex Pizza, MD  Admit date: 11/04/2012 Discharge date: 11/06/2012  Time spent: 45 minutes  Recommendations for Outpatient Follow-up:  Patient to followup with primary care physician at the nursing home within 2 days of admission. He should also follow up with his primary care physician within one week of discharge. Patient should see oncology care for his CLL. Hemoglobin and hematocrit should monitored. Urine cultures should also be followed.  Discharge Diagnoses:  Principal Problem:   Swelling of arm, possibly secondary to gout Active Problems:   Morbid obesity   Venous stasis dermatitis   A-fib   CLL (chronic lymphocytic leukemia)   ESRD on dialysis   UTI (urinary tract infection)   Upper right diffuse extremity pain   Altered mental status   Discharge Condition: Stable  Diet recommendation: Heart and renal healthy  Filed Weights   11/04/12 1416 11/04/12 1631 11/04/12 2023  Weight: 155.6 kg (343 lb 0.6 oz) 127 kg (279 lb 15.8 oz) 120.6 kg (265 lb 14 oz)    History of present illness:  67 yo male with recent dx of CLL (july 14) and after his first round of chemotherapy had sepsis requiring over 2 week hospilization at [facility] went into renal failure requiring dialysis. Has been on dialysis for almost 2 months on t/r/sat. During that hosp he did not require intubation, was d/c to [facility] then to [facility] last week. Prior to this illness, he was ambulatory, pretty functional and has had a significant deterioration since. Over the last several days his right arm has been progressively swelling and very painful. No rashes. He has a right IJ cath placed for dialysis on 10/7, he has no other access for dialysis. Has not had mapping done for perm cath yet. His daughter and wife are present. His daughter is an Alex Petty at [name] and very knowledgeable about his care.  Last week he also had some swelling in the right arm, an ultrasound was done and did not show a dvt. Today they were called because he was more confused than normal. Since the pain in his arm he has been started on norco, and his family thinks this may be contributing to the confusion. He is normally not easily confused. Pt was transferred from [name] ED for further w/u of the swelling. He was previously on coumadin back in aug and suffered from many bleeding issues, mainly very large hematomas had developed on buttocks and legs requiring multiple blood transfusions, his coumadin was stopped at this time. ?? Had UGIB during that time. He has bed bound since his illness started in July. He has had no n/v/d. No sob. Eating ok. No fevers. Again no rash including right arm. IJ right looks good. He has had also recent pseudomonas uti, completed course of iv vanco last week, had a PICC in right arm which has been pulled since. He does still make minimal urine.   Hospital Course:  This is a 67 year old male past medical history of CLL, end-stage renal disease requiring dialysis since July, gallops or presents emergency department with right upper extremity pain and swelling.  Ultrasound right upper extremity was conducted which was negative for DVT. This was thought to be secondary to gout. Patient is on allopurinol. Patient was given one dose of colchicine 0.6 mg as well as prednisone 20 mg. In the surgical continued for approximately 5 days. However this should also  be reviewed by the primary care physician.  Patient also found to have encephalopathy secondary to urinary tract infection upon admission. Patient does have baseline encephalopathy. According to the daughter. Patient has been somewhat disoriented and has been "in and out of it since July". It seems as encephalopathy started after receiving one dose of chemotherapy for his CLL.  Patient was started on Zosyn for empiric coverage of his urinary tract  infection. His past cultures have shown Klebsiella as well as enterococcus which have been susceptible to Augmentin.  Patient will be discharged with Augmentin. Urine cultures should be followed up on. Urine shows gram-negative rods at this time.  Patient was also found to have a hemoglobin of 7.7 on day of discharge. He was given one unit of packed red blood cells. He does not have any active bleeding at this time. This is likely secondary to his end-stage renal disease as well as CLL.  Patient does have a history of atrial fibrillation. We did continue his home medications of digoxin as well as diltiazem and he remained rate controlled. Patient to continue his dialysis during his stay.   On day of discharge patient was seen and found to be stable. Again he should be followed his primary care physician as well as a physician at the nursing home. Urine culture should be monitored as well as hemoglobin and hematocrit.  Procedures: Right upper extremity venous Doppler Preliminary report: There is no DVT or SVT noted in the right upper extremity. No change since study done 10-14-12.  Consultations: None  Discharge Exam: Filed Vitals:   11/06/12 0628  BP: 126/61  Pulse: 75  Temp: 98 F (36.7 C)  Resp: 14   Exam  General: Well developed, well nourished, NAD, appears stated age  HEENT: NCAT, PERRLA, EOMI, Anicteic Sclera, mucous membranes moist. No pharyngeal erythema or exudates  Neck: Supple, no JVD, no masses,  Cardiovascular: S1 S2 auscultated, no rubs, murmurs or gallops. Irregularly irregular. Right IJ in place.  Respiratory: Clear to auscultation bilaterally with equal chest rise  Abdomen: Soft, obese, nontender, nondistended, + bowel sounds  Extremities: warm dry without cyanosis clubbing or edema to the lower extremities. RUE swelling.  Neuro: AAOx3, cranial nerves grossly intact.  Skin: Without rashes exudates or nodules  Discharge Instructions     Medication List    ASK  your doctor about these medications       allopurinol 100 MG tablet  Commonly known as:  ZYLOPRIM  Take 100 mg by mouth daily.     b complex-vitamin c-folic acid 0.8 MG Tabs tablet  Take 0.8 mg by mouth at bedtime.     CALCIUM 600 + D PO  Take 1 tablet by mouth daily.     cyanocobalamin 1000 MCG tablet  Take 1 tablet (1,000 mcg total) by mouth daily.     digoxin 0.125 MG tablet  Commonly known as:  LANOXIN  Take 0.125 mg by mouth every other day.     diltiazem 120 MG 24 hr capsule  Commonly known as:  TIAZAC  Take 120 mg by mouth daily.     famotidine 20 MG tablet  Commonly known as:  PEPCID  Take 20 mg by mouth 2 (two) times daily.     feeding supplement (GLUCERNA SHAKE) Liqd  Take 237 mLs by mouth 3 (three) times daily between meals.     feeding supplement (PRO-STAT SUGAR FREE 64) Liqd  Take 30 mLs by mouth 3 (three) times daily with meals.  ferrous fumarate 325 (106 FE) MG Tabs tablet  Commonly known as:  HEMOCYTE - 106 mg FE  Take 1 tablet by mouth 2 (two) times daily.     HYDROcodone-acetaminophen 5-325 MG per tablet  Commonly known as:  NORCO/VICODIN  Take 1 tablet by mouth every 4 (four) hours as needed for pain.     insulin aspart 100 UNIT/ML injection  Commonly known as:  novoLOG  Inject 2-12 Units into the skin 3 (three) times daily with meals. If sugar is 150-200=2 units, 201-250=4 units, 251-300=6 units, 301-350=6 units, 351-400=10 units, 401-500=12 units, if above 400 call md.     metoCLOPramide 10 MG tablet  Commonly known as:  REGLAN  Take 10 mg by mouth 3 (three) times daily.     polyethylene glycol packet  Commonly known as:  MIRALAX / GLYCOLAX  Take 17 g by mouth daily.     pregabalin 75 MG capsule  Commonly known as:  LYRICA  Take 75 mg by mouth 2 (two) times daily.     saccharomyces boulardii 250 MG capsule  Commonly known as:  FLORASTOR  Take 250 mg by mouth 2 (two) times daily.     vitamin C 500 MG tablet  Commonly known as:   ASCORBIC ACID  Take 500 mg by mouth 2 (two) times daily.       Allergies  Allergen Reactions  . Ace Inhibitors Other (See Comments)    cough  . Rocephin [Ceftriaxone Sodium] Hives  . Beta Adrenergic Blockers Rash      The results of significant diagnostics from this hospitalization (including imaging, microbiology, ancillary and laboratory) are listed below for reference.    Significant Diagnostic Studies: Ir Fluoro Guide Cv Line Right  10/17/2012   CLINICAL DATA:  Renal failure and need for tunneled hemodialysis catheter for continued dialysis needs. Patient is currently dialyzed via a temporary left jugular dialysis catheter.  EXAM: TUNNELED CENTRAL VENOUS HEMODIALYSIS CATHETER PLACEMENT WITH ULTRASOUND AND FLUOROSCOPIC GUIDANCE  MEDICATIONS: The patient had received scheduled dosing of 1.5 g of IV vancomycin just prior to the procedure. IV antibiotic was given in an appropriate time interval prior to skin puncture.  ANESTHESIA/SEDATION: 2.0 mg IV Versed; 100 mcg IV Fentanyl.  Total Moderate Sedation Time  Twenty minutes.  FLUOROSCOPY TIME:  48 seconds.  PROCEDURE: The procedure, risks, benefits, and alternatives were explained to the patient. Questions regarding the procedure were encouraged and answered. The patient understands and consents to the procedure.  The right neck and chest were prepped with chlorhexidine in a sterile fashion, and a sterile drape was applied covering the operative field. Maximum barrier sterile technique with sterile gowns and gloves were used for the procedure. Local anesthesia was provided with 1% lidocaine.  Ultrasound was used to confirm patency of the right internal jugular vein. After creating a small venotomy incision, a 21 gauge needle was advanced into the right internal jugular vein under direct, real-time ultrasound guidance. Ultrasound image documentation was performed. After securing guidewire access, an 8 Fr dilator was placed. A J-wire was kinked to  measure appropriate catheter length.  A Bard HemoSplit tunneled hemodialysis catheter measuring 23 cm from tip to cuff was chosen for placement. This was tunneled in a retrograde fashion from the chest wall to the venotomy incision.  At the venotomy, serial dilatation was performed and a 16 Fr peel-away sheath was placed over a guidewire. The catheter was then placed through the sheath and the sheath removed. Final catheter positioning was confirmed and  documented with a fluoroscopic spot image. The catheter was aspirated, flushed with saline, and injected with appropriate volume heparin dwells.  The venotomy incision was closed with subcutaneous 3-0 Monocryl and subcuticular 4-0 Vicryl. Dermabond was applied to the incision. The catheter exit site was secured with 0-Prolene retention sutures.  COMPLICATIONS: None.  No pneumothorax.  FINDINGS: After catheter placement, the tips lie in the right atrium. The catheter aspirates normally and is ready for immediate use.  IMPRESSION: Placement of tunneled hemodialysis catheter via the right internal jugular vein. The catheter tips lie in the right atrium. The catheter is ready for immediate use.   Electronically Signed   By: Irish Lack M.D.   On: 10/17/2012 16:44   Ir US Guide Vasc Access Right  10/17/2012   CLINICAL DATA:  Renal failure and need for tunneled hemodialysis catheter for continued dialysis needs. Patient is currently dialyzed via a temporary left jugular dialysis catheter.  EXAM: TUNNELED CENTRAL VENOUS HEMODIALYSIS CATHETER PLACEMENT WITH ULTRASOUND AND FLUOROSCOPIC GUIDANCE  MEDICATIONS: The patient had received scheduled dosing of 1.5 g of IV vancomycin just prior to the procedure. IV antibiotic was given in an appropriate time interval prior to skin puncture.  ANESTHESIA/SEDATION: 2.0 mg IV Versed; 100 mcg IV Fentanyl.  Total Moderate Sedation Time  Twenty minutes.  FLUOROSCOPY TIME:  48 seconds.  PROCEDURE: The procedure, risks, benefits, and  alternatives were explained to the patient. Questions regarding the procedure were encouraged and answered. The patient understands and consents to the procedure.  The right neck and chest were prepped with chlorhexidine in a sterile fashion, and a sterile drape was applied covering the operative field. Maximum barrier sterile technique with sterile gowns and gloves were used for the procedure. Local anesthesia was provided with 1% lidocaine.  Ultrasound was used to confirm patency of the right internal jugular vein. After creating a small venotomy incision, a 21 gauge needle was advanced into the right internal jugular vein under direct, real-time ultrasound guidance. Ultrasound image documentation was performed. After securing guidewire access, an 8 Fr dilator was placed. A J-wire was kinked to measure appropriate catheter length.  A Bard HemoSplit tunneled hemodialysis catheter measuring 23 cm from tip to cuff was chosen for placement. This was tunneled in a retrograde fashion from the chest wall to the venotomy incision.  At the venotomy, serial dilatation was performed and a 16 Fr peel-away sheath was placed over a guidewire. The catheter was then placed through the sheath and the sheath removed. Final catheter positioning was confirmed and documented with a fluoroscopic spot image. The catheter was aspirated, flushed with saline, and injected with appropriate volume heparin dwells.  The venotomy incision was closed with subcutaneous 3-0 Monocryl and subcuticular 4-0 Vicryl. Dermabond was applied to the incision. The catheter exit site was secured with 0-Prolene retention sutures.  COMPLICATIONS: None.  No pneumothorax.  FINDINGS: After catheter placement, the tips lie in the right atrium. The catheter aspirates normally and is ready for immediate use.  IMPRESSION: Placement of tunneled hemodialysis catheter via the right internal jugular vein. The catheter tips lie in the right atrium. The catheter is ready  for immediate use.   Electronically Signed   By: Irish Lack M.D.   On: 10/17/2012 16:44   Dg Chest Port 1 View  11/04/2012   CLINICAL DATA:  Shortness of breath  EXAM: PORTABLE CHEST - 1 VIEW  COMPARISON:  10/17/2012  FINDINGS: Borderline cardiomegaly. No acute infiltrate or pulmonary edema. Right IJ dialysis catheter in  place. No diagnostic pneumothorax. Left basilar atelectasis.  IMPRESSION: Borderline cardiomegaly. No active disease.   Electronically Signed   By: Natasha Mead M.D.   On: 11/04/2012 14:55    Microbiology: Recent Results (from the past 240 hour(s))  URINE CULTURE     Status: None   Collection Time    11/04/12  3:10 PM      Result Value Range Status   Specimen Description URINE, CLEAN CATCH   Final   Special Requests NONE   Final   Culture  Setup Time     Final   Value: 11/04/2012 23:37     Performed at [facility] Count     Final   Value: >=100,000 COLONIES/ML     Performed at [facility]   Culture     Final   Value: GRAM NEGATIVE RODS     Performed at [facility]   Report Status PENDING   Incomplete     Labs: Basic Metabolic Panel:  Recent Labs Lab 11/04/12 1431 11/05/12 0609 11/06/12 0558  NA 129* 132* 133*  K 3.5 3.2* 4.1  CL 91* 97 98  CO2 24 21 19   GLUCOSE 112* 99 117*  BUN 24* 32* 47*  CREATININE 2.45* 3.03* 3.78*  CALCIUM 9.4 8.8 8.9  MG 1.8  --   --   PHOS 2.9  --   --    Liver Function Tests:  Recent Labs Lab 11/04/12 1431  AST 14  ALT 9  ALKPHOS 90  BILITOT 0.5  PROT 6.9  ALBUMIN 2.7*   No results found for this basename: LIPASE, AMYLASE,  in the last 168 hours  Recent Labs Lab 11/04/12 1431  AMMONIA 15   CBC:  Recent Labs Lab 11/04/12 1431 11/05/12 0609 11/06/12 0558  WBC 12.4* 7.7 8.3  NEUTROABS 11.2*  --   --   HGB 8.8* 8.0* 7.7*  HCT 27.2* 24.6* 23.4*  MCV 85.8 84.8 83.9  PLT 192 172 206   Cardiac Enzymes:  Recent Labs Lab 11/04/12 1431  TROPONINI <0.30    BNP: BNP (last 3 results)  Recent Labs  10/23/12 0400  PROBNP 3985.0*   CBG:  Recent Labs Lab 11/05/12 1049 11/05/12 1644 11/05/12 2114 11/06/12 0726  GLUCAP 104* 137* 128* 120*       Signed:  Idaliz Tinkle  [facility] 11/06/2012, 10:49 AM

## 2012-11-06 NOTE — Progress Notes (Signed)
Daughter Neysa Bonito notified of [facility]'s arrival.

## 2012-11-07 LAB — TYPE AND SCREEN: ABO/RH(D): A POS

## 2012-12-01 ENCOUNTER — Emergency Department (HOSPITAL_COMMUNITY): Payer: Medicare Other

## 2012-12-01 ENCOUNTER — Encounter (HOSPITAL_COMMUNITY): Payer: Self-pay | Admitting: Emergency Medicine

## 2012-12-01 ENCOUNTER — Inpatient Hospital Stay (HOSPITAL_COMMUNITY)
Admission: EM | Admit: 2012-12-01 | Discharge: 2012-12-05 | DRG: 871 | Disposition: A | Discharge status: Skilled Nursing Facility | Payer: Medicare Other | Attending: Internal Medicine | Admitting: Internal Medicine

## 2012-12-01 DIAGNOSIS — N186 End stage renal disease: Secondary | ICD-10-CM | POA: Diagnosis present

## 2012-12-01 DIAGNOSIS — Z992 Dependence on renal dialysis: Secondary | ICD-10-CM

## 2012-12-01 DIAGNOSIS — L02219 Cutaneous abscess of trunk, unspecified: Secondary | ICD-10-CM | POA: Diagnosis present

## 2012-12-01 DIAGNOSIS — L8990 Pressure ulcer of unspecified site, unspecified stage: Secondary | ICD-10-CM | POA: Diagnosis present

## 2012-12-01 DIAGNOSIS — G9341 Metabolic encephalopathy: Secondary | ICD-10-CM | POA: Diagnosis present

## 2012-12-01 DIAGNOSIS — A419 Sepsis, unspecified organism: Principal | ICD-10-CM | POA: Diagnosis present

## 2012-12-01 DIAGNOSIS — Z85038 Personal history of other malignant neoplasm of large intestine: Secondary | ICD-10-CM

## 2012-12-01 DIAGNOSIS — Z9221 Personal history of antineoplastic chemotherapy: Secondary | ICD-10-CM

## 2012-12-01 DIAGNOSIS — W19XXXA Unspecified fall, initial encounter: Secondary | ICD-10-CM

## 2012-12-01 DIAGNOSIS — N390 Urinary tract infection, site not specified: Secondary | ICD-10-CM | POA: Diagnosis present

## 2012-12-01 DIAGNOSIS — D709 Neutropenia, unspecified: Secondary | ICD-10-CM | POA: Diagnosis present

## 2012-12-01 DIAGNOSIS — Z794 Long term (current) use of insulin: Secondary | ICD-10-CM

## 2012-12-01 DIAGNOSIS — L89109 Pressure ulcer of unspecified part of back, unspecified stage: Secondary | ICD-10-CM | POA: Diagnosis present

## 2012-12-01 DIAGNOSIS — Z8249 Family history of ischemic heart disease and other diseases of the circulatory system: Secondary | ICD-10-CM

## 2012-12-01 DIAGNOSIS — M19079 Primary osteoarthritis, unspecified ankle and foot: Secondary | ICD-10-CM | POA: Diagnosis present

## 2012-12-01 DIAGNOSIS — L03116 Cellulitis of left lower limb: Secondary | ICD-10-CM

## 2012-12-01 DIAGNOSIS — D72819 Decreased white blood cell count, unspecified: Secondary | ICD-10-CM

## 2012-12-01 DIAGNOSIS — R4182 Altered mental status, unspecified: Secondary | ICD-10-CM

## 2012-12-01 DIAGNOSIS — Z803 Family history of malignant neoplasm of breast: Secondary | ICD-10-CM

## 2012-12-01 DIAGNOSIS — I4891 Unspecified atrial fibrillation: Secondary | ICD-10-CM | POA: Diagnosis present

## 2012-12-01 DIAGNOSIS — Z87440 Personal history of urinary (tract) infections: Secondary | ICD-10-CM

## 2012-12-01 DIAGNOSIS — E876 Hypokalemia: Secondary | ICD-10-CM | POA: Diagnosis present

## 2012-12-01 DIAGNOSIS — I120 Hypertensive chronic kidney disease with stage 5 chronic kidney disease or end stage renal disease: Secondary | ICD-10-CM | POA: Diagnosis present

## 2012-12-01 DIAGNOSIS — C9110 Chronic lymphocytic leukemia of B-cell type not having achieved remission: Secondary | ICD-10-CM | POA: Diagnosis present

## 2012-12-01 LAB — COMPREHENSIVE METABOLIC PANEL
ALT: 12 U/L (ref 0–53)
Albumin: 2.8 g/dL — ABNORMAL LOW (ref 3.5–5.2)
Alkaline Phosphatase: 83 U/L (ref 39–117)
BUN: 21 mg/dL (ref 6–23)
Calcium: 10.2 mg/dL (ref 8.4–10.5)
Chloride: 98 mEq/L (ref 96–112)
Creatinine, Ser: 3.78 mg/dL — ABNORMAL HIGH (ref 0.50–1.35)
GFR calc Af Amer: 18 mL/min — ABNORMAL LOW (ref >90)
Glucose, Bld: 105 mg/dL — ABNORMAL HIGH (ref 70–99)
Potassium: 3.4 mEq/L — ABNORMAL LOW (ref 3.5–5.1)
Sodium: 139 mEq/L (ref 135–145)
Total Bilirubin: 0.5 mg/dL (ref 0.3–1.2)
Total Protein: 6.2 g/dL (ref 6.0–8.3)

## 2012-12-01 LAB — URINALYSIS, ROUTINE W REFLEX MICROSCOPIC
Glucose, UA: NEGATIVE mg/dL
Hgb urine dipstick: NEGATIVE
Nitrite: NEGATIVE
Specific Gravity, Urine: >1.03 — ABNORMAL HIGH (ref 1.005–1.030)
pH: 5 (ref 5.0–8.0)

## 2012-12-01 LAB — LACTIC ACID, PLASMA: Lactic Acid, Venous: 1.7 mmol/L (ref 0.5–2.2)

## 2012-12-01 LAB — LIPASE, BLOOD: Lipase: 23 U/L (ref 11–59)

## 2012-12-01 LAB — TROPONIN I: Troponin I: <0.3 ng/mL (ref <0.30)

## 2012-12-01 LAB — URINE MICROSCOPIC-ADD ON

## 2012-12-01 MED ORDER — PIPERACILLIN-TAZOBACTAM IN DEX 2-0.25 GM/50ML IV SOLN
2.2500 g | Freq: Three times a day (TID) | INTRAVENOUS | Status: DC
  Administered 2012-12-01 – 2012-12-04 (×9): 2.25 g via INTRAVENOUS
  Filled 2012-12-01 (×12): qty 50

## 2012-12-01 MED ORDER — POLYETHYLENE GLYCOL 3350 17 G PO PACK: 17.0000 g | PACK | Freq: Every day | ORAL | Status: DC

## 2012-12-01 MED ORDER — ONDANSETRON HCL 4 MG PO TABS: 4.0000 mg | ORAL_TABLET | Freq: Four times a day (QID) | ORAL | Status: DC | PRN

## 2012-12-01 MED ORDER — METOCLOPRAMIDE HCL 5 MG/ML IJ SOLN
10.0000 mg | Freq: Four times a day (QID) | INTRAMUSCULAR | Status: DC
Start: 2012-12-01 — End: 2012-12-05
  Administered 2012-12-01 – 2012-12-05 (×14): 10 mg via INTRAVENOUS
  Filled 2012-12-01 (×14): qty 2

## 2012-12-01 MED ORDER — ACETAMINOPHEN 325 MG PO TABS
650.0000 mg | ORAL_TABLET | Freq: Four times a day (QID) | ORAL | Status: DC | PRN
  Administered 2012-12-02 – 2012-12-04 (×3): 650 mg via ORAL
  Filled 2012-12-01 (×4): qty 2

## 2012-12-01 MED ORDER — INSULIN ASPART 100 UNIT/ML ~~LOC~~ SOLN
0.0000 [IU] | Freq: Three times a day (TID) | SUBCUTANEOUS | Status: DC
  Administered 2012-12-02: 2 [IU] via SUBCUTANEOUS
  Administered 2012-12-03: 3 [IU] via SUBCUTANEOUS

## 2012-12-01 MED ORDER — PREGABALIN 75 MG PO CAPS
75.0000 mg | ORAL_CAPSULE | Freq: Two times a day (BID) | ORAL | Status: DC
  Administered 2012-12-01 – 2012-12-05 (×8): 75 mg via ORAL
  Filled 2012-12-01 (×8): qty 1

## 2012-12-01 MED ORDER — CIPROFLOXACIN IN D5W 400 MG/200ML IV SOLN
400.0000 mg | Freq: Once | INTRAVENOUS | Status: AC
  Administered 2012-12-01: 400 mg via INTRAVENOUS
  Filled 2012-12-01: qty 200

## 2012-12-01 MED ORDER — INSULIN ASPART 100 UNIT/ML ~~LOC~~ SOLN: 0.0000 [IU] | Freq: Every day | SUBCUTANEOUS | Status: DC

## 2012-12-01 MED ORDER — ACETAMINOPHEN 650 MG RE SUPP: 650.0000 mg | Freq: Four times a day (QID) | RECTAL | Status: DC | PRN

## 2012-12-01 MED ORDER — SACCHAROMYCES BOULARDII 250 MG PO CAPS
250.0000 mg | ORAL_CAPSULE | Freq: Two times a day (BID) | ORAL | Status: DC
  Administered 2012-12-01 – 2012-12-05 (×8): 250 mg via ORAL
  Filled 2012-12-01 (×8): qty 1

## 2012-12-01 MED ORDER — ALLOPURINOL 100 MG PO TABS
100.0000 mg | ORAL_TABLET | Freq: Every day | ORAL | Status: DC
  Administered 2012-12-01 – 2012-12-05 (×5): 100 mg via ORAL
  Filled 2012-12-01 (×5): qty 1

## 2012-12-01 MED ORDER — ONDANSETRON HCL 4 MG/2ML IJ SOLN: 4.0000 mg | Freq: Four times a day (QID) | INTRAMUSCULAR | Status: DC | PRN

## 2012-12-01 MED ORDER — SODIUM CHLORIDE 0.9 % IV SOLN: INTRAVENOUS | Status: DC

## 2012-12-01 MED ORDER — HEPARIN SODIUM (PORCINE) 5000 UNIT/ML IJ SOLN
5000.0000 [IU] | Freq: Three times a day (TID) | INTRAMUSCULAR | Status: DC
  Administered 2012-12-01 – 2012-12-05 (×11): 5000 [IU] via SUBCUTANEOUS
  Filled 2012-12-01 (×11): qty 1

## 2012-12-01 MED ORDER — PIPERACILLIN-TAZOBACTAM IN DEX 2-0.25 GM/50ML IV SOLN
INTRAVENOUS | Status: AC
  Filled 2012-12-01: qty 100

## 2012-12-01 MED ORDER — DIGOXIN 125 MCG PO TABS
0.1250 mg | ORAL_TABLET | ORAL | Status: DC
  Administered 2012-12-03: 0.125 mg via ORAL
  Filled 2012-12-01 (×2): qty 1

## 2012-12-01 MED ORDER — SODIUM CHLORIDE 0.9 % IV SOLN
INTRAVENOUS | Status: DC
  Administered 2012-12-02 – 2012-12-04 (×4): via INTRAVENOUS

## 2012-12-01 NOTE — ED Provider Notes (Signed)
CSN: [id number]     Arrival date & time 12/01/12  1130 History   First MD Initiated Contact with Patient 12/01/12 1310     This chart was scribed for Shelda Jakes, MD by Manuela Schwartz, ED scribe. This patient was seen in room APA04/APA04 and the patient's care was started at 1310.  Chief Complaint  Patient presents with  . Fall   Patient is a 67 y.o. male presenting with fall. The history is provided by the patient. No language interpreter was used.  Fall This is a new problem. The current episode started yesterday. The problem has not changed since onset.Pertinent negatives include no chest pain, no abdominal pain, no headaches and no shortness of breath. Nothing aggravates the symptoms. Nothing relieves the symptoms. He has tried nothing for the symptoms.   Level 5 Caveat to Patients AMS  HPI Comments: Alex Petty is a 67 y.o. male brought in by ambulance from [facility], who presents to the Emergency Department complaining of AMS and ?back pain after a fall last PM while getting out of bed, did not hit his head. His wife reports that he seems more sleepy this AM than normal. Reports from [facility] that prior to his fall, he was moving and thrashing in his bed which is unusual for him. 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 Until July, he was ambulatory w/a cane, but is no longer. He was admitted here October 25 w/AMS. He is HD TTS (Dialyzed yesterday). He takes hydrocodone as need on a regular basis.   Past Medical History  Diagnosis Date  . Arthritis   . Hypertension   . DJD (degenerative joint disease)   . DJD (degenerative joint disease), ankle and foot   . Arthritis   . Cellulitis   . Arthritis   . Chronic back pain   . Gout   . Dysrhythmia     afibrillation  . Atrial fibrillation   . Chronic kidney disease   . Colon cancer     2005  . 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 Charcot  ankle     dr. Emilio Math  . Pneumonia   . Bacteremia    Past Surgical History  Procedure Laterality Date  . Foot surgery      right foot-  . Colon surgery      forcolon cancer, 2005, Dr. Marcell Anger in [place]  . Tonsillectomy    . Cystoscopy with urethral dilatation    . Back surgery      x2  . Cataract extraction w/phaco  01/14/2011    Procedure: CATARACT EXTRACTION PHACO AND INTRAOCULAR LENS PLACEMENT (IOC);  Surgeon: Gemma Payor;  Location: [facility];  Service: Ophthalmology;  Laterality: Left;  CDE=16.27  . Lymph node biopsy Right 07/11/2012    Procedure: CERVICAL LYMPH NODE BIOPSY;  Surgeon: Marlane Hatcher, MD;  Location: [facility];  Service: General;  Laterality: Right;  end @ 1101  . Portacath placement Right 07/11/2012    Procedure: INSERTION PORT-A-CATH;  Surgeon: Marlane Hatcher, MD;  Location: [facility];  Service: General;  Laterality: Right;  . Port-a-cath removal Right 08/05/2012    Procedure: REMOVAL PORT-A-CATH;  Surgeon: Shelly Rubenstein, MD;  Location: [facility];  Service: General;  Laterality: Right;   Family History  Problem Relation Age of Onset  . Anesthesia problems Neg Hx   . Hypotension Neg Hx   . Malignant hyperthermia Neg Hx   .  Pseudochol deficiency Neg Hx   . Breast cancer Mother   . Heart disease Mother    History  Substance Use Topics  . Smoking status: Never Smoker   . Smokeless tobacco: Never Used  . Alcohol Use: No    Review of Systems  Constitutional: Positive for activity change (more sleepy/groggy). Negative for fever and chills.  HENT: Negative for congestion and rhinorrhea.   Respiratory: Negative for cough and shortness of breath.   Cardiovascular: Negative for chest pain.  Gastrointestinal: Negative for nausea, vomiting, abdominal pain and diarrhea.  Musculoskeletal: Positive for back pain (questionable back pain).  Skin: Negative for color change and rash.  Neurological: Negative for syncope and headaches.  Hematological: Does not  bruise/bleed easily.  All other systems reviewed and are negative.   A complete 10 system review of systems was obtained and all systems are negative except as noted in the HPI and PMH.   Allergies  Ace inhibitors; Rocephin; and Beta adrenergic blockers  Home Medications   Current Outpatient Rx  Name  Route  Sig  Dispense  Refill  . allopurinol (ZYLOPRIM) 100 MG tablet   Oral   Take 100 mg by mouth daily.         . Amino Acids-Protein Hydrolys (FEEDING SUPPLEMENT, PRO-STAT SUGAR FREE 64,) LIQD   Oral   Take 30 mLs by mouth 3 (three) times daily with meals.         . Calcium Carb-Cholecalciferol (CALCIUM 600 + D PO)   Oral   Take 1 tablet by mouth daily.         . digoxin (LANOXIN) 0.125 MG tablet   Oral   Take 0.125 mg by mouth every other day.         . diltiazem (TIAZAC) 120 MG 24 hr capsule   Oral   Take 120 mg by mouth daily.         . famotidine (PEPCID) 20 MG tablet   Oral   Take 1 tablet (20 mg total) by mouth 2 (two) times daily.         . ferrous fumarate (HEMOCYTE - 106 MG FE) 325 (106 FE) MG TABS tablet   Oral   Take 1 tablet by mouth 2 (two) times daily.         . Folic Acid 0.8 MG CAPS   Oral   Take 1 capsule by mouth daily.         Marland Kitchen HYDROcodone-acetaminophen (NORCO/VICODIN) 5-325 MG per tablet   Oral   Take 1 tablet by mouth every 4 (four) hours as needed for pain.         Marland Kitchen insulin aspart (NOVOLOG) 100 UNIT/ML injection   Subcutaneous   Inject 2-12 Units into the skin 3 (three) times daily with meals. If sugar is 150-200=2 units, 201-250=4 units, 251-300=6 units, 301-350=6 units, 351-400=10 units, 401-500=12 units, if above 400 call md.         . metoCLOPramide (REGLAN) 10 MG tablet   Oral   Take 10 mg by mouth 3 (three) times daily.         . polyethylene glycol (MIRALAX / GLYCOLAX) packet   Oral   Take 17 g by mouth daily.         . pregabalin (LYRICA) 75 MG capsule   Oral   Take 75 mg by mouth 2 (two) times  daily.         Marland Kitchen saccharomyces boulardii (FLORASTOR) 250 MG capsule   Oral  Take 250 mg by mouth 2 (two) times daily.         . vitamin B-12 1000 MCG tablet   Oral   Take 1 tablet (1,000 mcg total) by mouth daily.         . vitamin C (ASCORBIC ACID) 500 MG tablet   Oral   Take 500 mg by mouth 2 (two) times daily.           Triage Vitals: BP 122/60  Pulse 81  Temp(Src) 98.4 F (36.9 C) (Oral)  Resp 18  SpO2 97% Physical Exam  Nursing note and vitals reviewed. Constitutional: He is oriented to person, place, and time. He appears well-developed and well-nourished. No distress.  HENT:  Head: Normocephalic and atraumatic.  Eyes: Conjunctivae are normal. Right eye exhibits no discharge. Left eye exhibits no discharge.  Pupils 2mm  Neck: Normal range of motion. No tracheal deviation present.  Cardiovascular: Normal rate, regular rhythm and normal heart sounds.   No murmur heard. Pulmonary/Chest: Effort normal and breath sounds normal. No respiratory distress.  Abdominal: Soft. Bowel sounds are normal. He exhibits no distension. There is no tenderness.  Flat abdomen  Musculoskeletal: Normal range of motion. He exhibits no edema.  Neurological: He is alert and oriented to person, place, and time.  Skin: Skin is warm and dry.  Chronic skin changes to anterior BLE  Psychiatric: He has a normal mood and affect. Thought content normal.    ED Course  Procedures (including critical care time) DIAGNOSTIC STUDIES: Oxygen Saturation is 97% on room air, normal by my interpretation.    COORDINATION OF CARE: At 135 PM Discussed treatment plan with patient which includes IV fluids, lumbar and chest X-ray, head CT, UA, blood work, cardiac enzymes, EKG. Patient agrees.   Labs Review Labs Reviewed  URINALYSIS, ROUTINE W REFLEX MICROSCOPIC - Abnormal; Notable for the following:    Color, Urine BROWN (*)    APPearance CLOUDY (*)    Specific Gravity, Urine >1.030 (*)    Bilirubin  Urine SMALL (*)    Ketones, ur 15 (*)    Protein, ur TRACE (*)    Leukocytes, UA TRACE (*)    All other components within normal limits  CBC WITH DIFFERENTIAL - Abnormal; Notable for the following:    WBC 1.3 (*)    RBC 3.65 (*)    Hemoglobin 9.6 (*)    HCT 31.5 (*)    RDW 20.1 (*)    Neutrophils Relative % 5 (*)    Monocytes Relative 51 (*)    Neutro Abs 0.1 (*)    Lymphs Abs 0.5 (*)    All other components within normal limits  COMPREHENSIVE METABOLIC PANEL - Abnormal; Notable for the following:    Potassium 3.4 (*)    Glucose, Bld 105 (*)    Creatinine, Ser 3.78 (*)    Albumin 2.8 (*)    GFR calc non Af Amer 15 (*)    GFR calc Af Amer 18 (*)    All other components within normal limits  URINE MICROSCOPIC-ADD ON - Abnormal; Notable for the following:    Bacteria, UA FEW (*)    All other components within normal limits  URINE CULTURE  CULTURE, BLOOD (ROUTINE X 2)  CULTURE, BLOOD (ROUTINE X 2)  LIPASE, BLOOD  TROPONIN I  LACTIC ACID, PLASMA   Results for orders placed during the hospital encounter of 12/01/12  URINALYSIS, ROUTINE W REFLEX MICROSCOPIC      Result Value Range  Color, Urine BROWN (*) YELLOW   APPearance CLOUDY (*) CLEAR   Specific Gravity, Urine >1.030 (*) 1.005 - 1.030   pH 5.0  5.0 - 8.0   Glucose, UA NEGATIVE  NEGATIVE mg/dL   Hgb urine dipstick NEGATIVE  NEGATIVE   Bilirubin Urine SMALL (*) NEGATIVE   Ketones, ur 15 (*) NEGATIVE mg/dL   Protein, ur TRACE (*) NEGATIVE mg/dL   Urobilinogen, UA 0.2  0.0 - 1.0 mg/dL   Nitrite NEGATIVE  NEGATIVE   Leukocytes, UA TRACE (*) NEGATIVE  CBC WITH DIFFERENTIAL      Result Value Range   WBC 1.3 (*) 4.0 - 10.5 K/uL   RBC 3.65 (*) 4.22 - 5.81 MIL/uL   Hemoglobin 9.6 (*) 13.0 - 17.0 g/dL   HCT 16.1 (*) 09.6 - 04.5 %   MCV 86.3  78.0 - 100.0 fL   MCH 26.3  26.0 - 34.0 pg   MCHC 30.5  30.0 - 36.0 g/dL   RDW 40.9 (*) 81.1 - 91.4 %   Platelets 238  150 - 400 K/uL   Neutrophils Relative % 5 (*) 43 - 77 %    Lymphocytes Relative 42  12 - 46 %   Monocytes Relative 51 (*) 3 - 12 %   Eosinophils Relative 1  0 - 5 %   Basophils Relative 1  0 - 1 %   Neutro Abs 0.1 (*) 1.7 - 7.7 K/uL   Lymphs Abs 0.5 (*) 0.7 - 4.0 K/uL   Monocytes Absolute 0.7  0.1 - 1.0 K/uL   Eosinophils Absolute 0.0  0.0 - 0.7 K/uL   Basophils Absolute 0.0  0.0 - 0.1 K/uL   RBC Morphology TEARDROP CELLS    COMPREHENSIVE METABOLIC PANEL      Result Value Range   Sodium 139  135 - 145 mEq/L   Potassium 3.4 (*) 3.5 - 5.1 mEq/L   Chloride 98  96 - 112 mEq/L   CO2 25  19 - 32 mEq/L   Glucose, Bld 105 (*) 70 - 99 mg/dL   BUN 21  6 - 23 mg/dL   Creatinine, Ser 7.82 (*) 0.50 - 1.35 mg/dL   Calcium 95.6  8.4 - 21.3 mg/dL   Total Protein 6.2  6.0 - 8.3 g/dL   Albumin 2.8 (*) 3.5 - 5.2 g/dL   AST 18  0 - 37 U/L   ALT 12  0 - 53 U/L   Alkaline Phosphatase 83  39 - 117 U/L   Total Bilirubin 0.5  0.3 - 1.2 mg/dL   GFR calc non Af Amer 15 (*) >90 mL/min   GFR calc Af Amer 18 (*) >90 mL/min  LIPASE, BLOOD      Result Value Range   Lipase 23  11 - 59 U/L  TROPONIN I      Result Value Range   Troponin I <0.30  <0.30 ng/mL  LACTIC ACID, PLASMA      Result Value Range   Lactic Acid, Venous 1.7  0.5 - 2.2 mmol/L  URINE MICROSCOPIC-ADD ON      Result Value Range   WBC, UA TOO NUMEROUS TO COUNT  <3 WBC/hpf   RBC / HPF 21-50  <3 RBC/hpf   Bacteria, UA FEW (*) RARE    Imaging Review Dg Chest 1 View  12/01/2012   CLINICAL DATA:  Fall, hypertension, atrial fibrillation  EXAM: CHEST - 1 VIEW  COMPARISON:  11/04/2012  FINDINGS: Stable severe cardiac enlargement. Vascular pattern normal. No infiltrate  consolidation effusion or pneumothorax. Dual lumen central line on the right is stable. Bony thorax grossly intact.  IMPRESSION: No active disease.   Electronically Signed   By: Esperanza Heir M.D.   On: 12/01/2012 14:59   Dg Lumbar Spine Complete  12/01/2012   CLINICAL DATA:  Fall, back pain  EXAM: LUMBAR SPINE - COMPLETE 4+ VIEW   COMPARISON:  None.  FINDINGS: Mild convex left scoliosis. No fracture. L3-4, L4-5, and L5-S1 show significant degenerative facet change. There is bilateral sacroiliac degenerative joint disease. There is severe L3-4 degenerative disc disease. There is moderate L4-5 and moderate to severe L5-S1 degenerative disc disease. There is no fracture.  IMPRESSION: Severe degenerative change.  No acute findings.   Electronically Signed   By: Esperanza Heir M.D.   On: 12/01/2012 15:00   Ct Head Wo Contrast  12/01/2012   CLINICAL DATA:  Larey Seat out of bed last evening.  EXAM: CT HEAD WITHOUT CONTRAST  TECHNIQUE: Contiguous axial images were obtained from the base of the skull through the vertex without intravenous contrast.  COMPARISON:  None  FINDINGS: Ventricles are normal in configuration. There is ventricular and sulcal enlargement reflecting moderate atrophy. No parenchymal masses or mass effect. No evidence of a cortical infarct. There are no extra-axial masses or abnormal fluid collections.  No intracranial hemorrhage.  No skull fracture. Mucous retention cyst in the left maxillary sinus.  IMPRESSION: No acute intracranial abnormalities.  Moderate atrophy.   Electronically Signed   By: Amie Portland M.D.   On: 12/01/2012 14:42    EKG Interpretation    Date/Time:  Friday December 01 2012 14:07:27 EST Ventricular Rate:  74 PR Interval:    QRS Duration: 118 QT Interval:  348 QTC Calculation: 386 R Axis:   -53 Text Interpretation:  Atrial fibrillation with premature ventricular or aberrantly conducted complexes Left axis deviation Inferior infarct , age undetermined Possible Anterior infarct (cited on or before 06-Jan-2011) Abnormal ECG When compared with ECG of 04-Nov-2012 14:42, Left anterior fascicular block is no longer Present Incomplete right bundle branch block is no longer Present Inferior infarct is now Present Questionable change in initial forces of Anterolateral leads Confirmed by Mccayla Shimada  MD,  [place] ([id number]) on 12/01/2012 3:07:42 PM            MDM   1. UTI (lower urinary tract infection)   2. Leukopenia   3. Altered mental status    Patient with complicated the medical history 2 in include the lymphoma. Patient currently the leukopenia. Patient's had staph infections also had recurrent Pseudomonas urinary tract infections. Patient with fall last evening however workup for the fall doesn't show any acute injuries. Suspect the altered mental status is related to the urinary tract infection. Possibly could be developing sepsis however lactic acid is normal. Not tachycardic and blood pressure is not hypotensive. Treated with Cipro here contact the hospitalist they will admit and continue treatment. Blood cultures are pending.  I personally performed the services described in this documentation, which was scribed in my presence. The recorded information has been reviewed and is accurate.       Shelda Jakes, MD 12/01/12 [phone]

## 2012-12-01 NOTE — ED Notes (Signed)
Here from [facility] for evaluation of fall that happened last night. Pt states he fell of his bed. Complain of back pain.

## 2012-12-01 NOTE — Progress Notes (Addendum)
ANTIBIOTIC CONSULT NOTE - INITIAL  Pharmacy Consult for zosyn Indication: UTI  Allergies  Allergen Reactions  . Ace Inhibitors Other (See Comments)    cough  . Rocephin [Ceftriaxone Sodium] Hives  . Beta Adrenergic Blockers Rash    Patient Measurements: Height: 5\' 9"  (175.3 cm) Weight: 265 lb 14 oz (120.6 kg) IBW/kg (Calculated) : 70.7  Vital Signs: Temp: 97.5 F (36.4 C) (11/21 1921) Temp src: Oral (11/21 1921) BP: 93/59 mmHg (11/21 1921) Pulse Rate: 75 (11/21 1921) Intake/Output from previous day:   Intake/Output from this shift:    Labs:  Recent Labs  12/01/12 1343  WBC 1.3*  HGB 9.6*  PLT 238  CREATININE 3.78*   Estimated Creatinine Clearance: 24.3 ml/min (by C-G formula based on Cr of 3.78). No results found for this basename: VANCOTROUGH, Leodis Binet, VANCORANDOM, GENTTROUGH, GENTPEAK, GENTRANDOM, TOBRATROUGH, TOBRAPEAK, TOBRARND, AMIKACINPEAK, AMIKACINTROU, AMIKACIN,  in the last 72 hours   Microbiology: Recent Results (from the past 720 hour(s))  URINE CULTURE     Status: None   Collection Time    11/04/12  3:10 PM      Result Value Range Status   Specimen Description URINE, CLEAN CATCH   Final   Special Requests NONE   Final   Culture  Setup Time     Final   Value: 11/04/2012 23:37     Performed at [facility] Count     Final   Value: >=100,000 COLONIES/ML     Performed at [facility]   Culture     Final   Value: PSEUDOMONAS AERUGINOSA     Performed at [facility]   Report Status 11/06/2012 FINAL   Final   Organism ID, Bacteria PSEUDOMONAS AERUGINOSA   Final  CULTURE, BLOOD (ROUTINE X 2)     Status: None   Collection Time    12/01/12  5:10 PM      Result Value Range Status   Specimen Description LEFT ANTECUBITAL   Final   Special Requests BOTTLES DRAWN AEROBIC AND ANAEROBIC  15 CC EAC   Final   Culture PENDING   Incomplete   Report Status PENDING   Incomplete  CULTURE, BLOOD (ROUTINE X 2)     Status: None    Collection Time    12/01/12  5:13 PM      Result Value Range Status   Specimen Description RIGHT ANTECUBITAL   Final   Special Requests BOTTLES DRAWN AEROBIC AND ANAEROBIC 15 CC EACH   Final   Culture PENDING   Incomplete   Report Status PENDING   Incomplete    Medical History: Past Medical History  Diagnosis Date  . Arthritis   . Hypertension   . DJD (degenerative joint disease)   . DJD (degenerative joint disease), ankle and foot   . Arthritis   . Cellulitis   . Arthritis   . Chronic back pain   . Gout   . Dysrhythmia     afibrillation  . Atrial fibrillation   . Chronic kidney disease   . Colon cancer     2005  . 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 Charcot ankle     dr. Emilio Math  . Bacteremia     Medications:  Prescriptions prior to admission  Medication Sig Dispense Refill  . allopurinol (ZYLOPRIM) 100 MG tablet Take 100 mg by mouth daily.      . Amino Acids-Protein Hydrolys (FEEDING SUPPLEMENT, PRO-STAT SUGAR FREE 64,) LIQD Take 30  mLs by mouth 3 (three) times daily with meals.      . Calcium Carb-Cholecalciferol (CALCIUM 600 + D PO) Take 1 tablet by mouth daily.      . digoxin (LANOXIN) 0.125 MG tablet Take 0.125 mg by mouth every other day.      . diltiazem (TIAZAC) 120 MG 24 hr capsule Take 120 mg by mouth daily.      . famotidine (PEPCID) 20 MG tablet Take 1 tablet (20 mg total) by mouth 2 (two) times daily.      . ferrous fumarate (HEMOCYTE - 106 MG FE) 325 (106 FE) MG TABS tablet Take 1 tablet by mouth 2 (two) times daily.      . Folic Acid 0.8 MG CAPS Take 1 capsule by mouth daily.      Marland Kitchen HYDROcodone-acetaminophen (NORCO/VICODIN) 5-325 MG per tablet Take 1 tablet by mouth every 4 (four) hours as needed for pain.      Marland Kitchen insulin aspart (NOVOLOG) 100 UNIT/ML injection Inject 2-12 Units into the skin 3 (three) times daily with meals. If sugar is 150-200=2 units, 201-250=4 units, 251-300=6 units, 301-350=6 units, 351-400=10 units, 401-500=12 units,  if above 400 call md.      . metoCLOPramide (REGLAN) 10 MG tablet Take 10 mg by mouth 3 (three) times daily.      . polyethylene glycol (MIRALAX / GLYCOLAX) packet Take 17 g by mouth daily.      . pregabalin (LYRICA) 75 MG capsule Take 75 mg by mouth 2 (two) times daily.      Marland Kitchen saccharomyces boulardii (FLORASTOR) 250 MG capsule Take 250 mg by mouth 2 (two) times daily.      . vitamin B-12 1000 MCG tablet Take 1 tablet (1,000 mcg total) by mouth daily.      . vitamin C (ASCORBIC ACID) 500 MG tablet Take 500 mg by mouth 2 (two) times daily.        Assessment: Okay for Protocol, allegies noted.  Has tolerated Zosyn during previous admissions.  ESRD on HD.  Goal of Therapy:  Eradicate infection.  Plan:  Zosyn 2.25gm IV every 8 hours. Follow-up micro data, labs, vitals. Follow up culture results  Mady Gemma 12/01/2012,8:48 PM

## 2012-12-01 NOTE — ED Notes (Signed)
Spoke with Dennie Bible from [facility] - states pt has been "loopy".  States he "talks out of his head sometimes since fall."  States is not his normal self.

## 2012-12-01 NOTE — H&P (Signed)
[facility] History and Physical  COLEMAN KALAS ZOX:[id number] DOB: 1945/11/07 DOA: 12/01/2012  Referring physician: Dr. Deretha Emory PCP: [name], MD  Specialists: [facility], nephrologist [place]  Chief Complaint: Altered mental status  HPI: Alex Petty is a 67 y.o. male with multiple medical problems who has had multiple prolonged hospitalizations since 7/14. Patient is currently residing at [facility] to undergo physical therapy. His family reports that he was in his usual state of health yesterday. Overnight, the patient became increasingly confused and had a fall overnight. This morning, he was noted to be increasingly lethargic. His family feels that this is consistent with prior urinary tract infections. He is brought to the emergency room where it was noted that his blood pressure was in the lower side. He was noted to be significantly leukopenic/neutropenic. Urinalysis confirmed a possible urinary tract infection. Patient will be admitted for further treatments. His family does not report any recent shortness of breath, cough, vomiting, diarrhea.  Review of Systems: Pertinent positives as per history of present illness, otherwise negative  Past Medical History  Diagnosis Date  . Arthritis   . Hypertension   . DJD (degenerative joint disease)   . DJD (degenerative joint disease), ankle and foot   . Arthritis   . Cellulitis   . Arthritis   . Chronic back pain   . Gout   . Dysrhythmia     afibrillation  . Atrial fibrillation   . Chronic kidney disease   . Colon cancer     2005  . 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 Charcot ankle     dr. Emilio Math  . Bacteremia    Past Surgical History  Procedure Laterality Date  . Foot surgery      right foot-  . Colon surgery      forcolon cancer, 2005, Dr. Marcell Anger in [place]  . Tonsillectomy    . Cystoscopy with urethral dilatation    . Back surgery      x2  . Cataract extraction  w/phaco  01/14/2011    Procedure: CATARACT EXTRACTION PHACO AND INTRAOCULAR LENS PLACEMENT (IOC);  Surgeon: Gemma Payor;  Location: [facility];  Service: Ophthalmology;  Laterality: Left;  CDE=16.27  . Lymph node biopsy Right 07/11/2012    Procedure: CERVICAL LYMPH NODE BIOPSY;  Surgeon: Marlane Hatcher, MD;  Location: [facility];  Service: General;  Laterality: Right;  end @ 1101  . Portacath placement Right 07/11/2012    Procedure: INSERTION PORT-A-CATH;  Surgeon: Marlane Hatcher, MD;  Location: [facility];  Service: General;  Laterality: Right;  . Port-a-cath removal Right 08/05/2012    Procedure: REMOVAL PORT-A-CATH;  Surgeon: Shelly Rubenstein, MD;  Location: [facility];  Service: General;  Laterality: Right;   Social History:  reports that he has never smoked. He has never used smokeless tobacco. He reports that he does not drink alcohol or use illicit drugs.   Allergies  Allergen Reactions  . Ace Inhibitors Other (See Comments)    cough  . Rocephin [Ceftriaxone Sodium] Hives  . Beta Adrenergic Blockers Rash    Family History  Problem Relation Age of Onset  . Anesthesia problems Neg Hx   . Hypotension Neg Hx   . Malignant hyperthermia Neg Hx   . Pseudochol deficiency Neg Hx   . Breast cancer Mother   . Heart disease Mother     Prior to Admission medications   Medication Sig Start Date End Date Taking? Authorizing Provider  allopurinol (  ZYLOPRIM) 100 MG tablet Take 100 mg by mouth daily.   Yes Historical Provider, MD  Amino Acids-Protein Hydrolys (FEEDING SUPPLEMENT, PRO-STAT SUGAR FREE 64,) LIQD Take 30 mLs by mouth 3 (three) times daily with meals.   Yes Historical Provider, MD  Calcium Carb-Cholecalciferol (CALCIUM 600 + D PO) Take 1 tablet by mouth daily.   Yes Historical Provider, MD  digoxin (LANOXIN) 0.125 MG tablet Take 0.125 mg by mouth every other day.   Yes Historical Provider, MD  diltiazem (TIAZAC) 120 MG 24 hr capsule Take 120 mg by mouth daily.   Yes Historical Provider, MD   famotidine (PEPCID) 20 MG tablet Take 1 tablet (20 mg total) by mouth 2 (two) times daily. 11/06/12  Yes Maryann Mikhail, DO  ferrous fumarate (HEMOCYTE - 106 MG FE) 325 (106 FE) MG TABS tablet Take 1 tablet by mouth 2 (two) times daily.   Yes Historical Provider, MD  Folic Acid 0.8 MG CAPS Take 1 capsule by mouth daily.   Yes Historical Provider, MD  HYDROcodone-acetaminophen (NORCO/VICODIN) 5-325 MG per tablet Take 1 tablet by mouth every 4 (four) hours as needed for pain.   Yes Historical Provider, MD  insulin aspart (NOVOLOG) 100 UNIT/ML injection Inject 2-12 Units into the skin 3 (three) times daily with meals. If sugar is 150-200=2 units, 201-250=4 units, 251-300=6 units, 301-350=6 units, 351-400=10 units, 401-500=12 units, if above 400 call md.   Yes Historical Provider, MD  metoCLOPramide (REGLAN) 10 MG tablet Take 10 mg by mouth 3 (three) times daily.   Yes Historical Provider, MD  polyethylene glycol (MIRALAX / GLYCOLAX) packet Take 17 g by mouth daily.   Yes Historical Provider, MD  pregabalin (LYRICA) 75 MG capsule Take 75 mg by mouth 2 (two) times daily.   Yes Historical Provider, MD  saccharomyces boulardii (FLORASTOR) 250 MG capsule Take 250 mg by mouth 2 (two) times daily.   Yes Historical Provider, MD  vitamin B-12 1000 MCG tablet Take 1 tablet (1,000 mcg total) by mouth daily. 08/17/12  Yes Calvert Cantor, MD  vitamin C (ASCORBIC ACID) 500 MG tablet Take 500 mg by mouth 2 (two) times daily.    Yes Historical Provider, MD   Physical Exam: Filed Vitals:   12/01/12 1700  BP: 97/65  Pulse: 72  Temp:   Resp: 17     General:  Lethargic but arousable, answers questions appropriately  Eyes: PERRLa  ENT: mucous membranes are dry  Neck: supple  Cardiovascular: s1, s2 irregular  Respiratory: cta b  Abdomen: soft, nt, nd, bs+  Skin: venous stasis changes in LE bilaterally  Musculoskeletal: 1+ edema in LE bilaterally  Psychiatric: lethargic  Neurologic: difficult to  assess due to mental status  Labs on Admission:  Basic Metabolic Panel:  Recent Labs Lab 12/01/12 1343  NA 139  K 3.4*  CL 98  CO2 25  GLUCOSE 105*  BUN 21  CREATININE 3.78*  CALCIUM 10.2   Liver Function Tests:  Recent Labs Lab 12/01/12 1343  AST 18  ALT 12  ALKPHOS 83  BILITOT 0.5  PROT 6.2  ALBUMIN 2.8*    Recent Labs Lab 12/01/12 1343  LIPASE 23   No results found for this basename: AMMONIA,  in the last 168 hours CBC:  Recent Labs Lab 12/01/12 1343  WBC 1.3*  NEUTROABS 0.1*  HGB 9.6*  HCT 31.5*  MCV 86.3  PLT 238   Cardiac Enzymes:  Recent Labs Lab 12/01/12 1343  TROPONINI <0.30    BNP (last  3 results)  Recent Labs  10/23/12 0400  PROBNP 3985.0*   CBG: No results found for this basename: GLUCAP,  in the last 168 hours  Radiological Exams on Admission: Dg Chest 1 View  12/01/2012   CLINICAL DATA:  Fall, hypertension, atrial fibrillation  EXAM: CHEST - 1 VIEW  COMPARISON:  11/04/2012  FINDINGS: Stable severe cardiac enlargement. Vascular pattern normal. No infiltrate consolidation effusion or pneumothorax. Dual lumen central line on the right is stable. Bony thorax grossly intact.  IMPRESSION: No active disease.   Electronically Signed   By: Esperanza Heir M.D.   On: 12/01/2012 14:59   Dg Lumbar Spine Complete  12/01/2012   CLINICAL DATA:  Fall, back pain  EXAM: LUMBAR SPINE - COMPLETE 4+ VIEW  COMPARISON:  None.  FINDINGS: Mild convex left scoliosis. No fracture. L3-4, L4-5, and L5-S1 show significant degenerative facet change. There is bilateral sacroiliac degenerative joint disease. There is severe L3-4 degenerative disc disease. There is moderate L4-5 and moderate to severe L5-S1 degenerative disc disease. There is no fracture.  IMPRESSION: Severe degenerative change.  No acute findings.   Electronically Signed   By: Esperanza Heir M.D.   On: 12/01/2012 15:00   Ct Head Wo Contrast  12/01/2012   CLINICAL DATA:  Larey Seat out of bed last  evening.  EXAM: CT HEAD WITHOUT CONTRAST  TECHNIQUE: Contiguous axial images were obtained from the base of the skull through the vertex without intravenous contrast.  COMPARISON:  None  FINDINGS: Ventricles are normal in configuration. There is ventricular and sulcal enlargement reflecting moderate atrophy. No parenchymal masses or mass effect. No evidence of a cortical infarct. There are no extra-axial masses or abnormal fluid collections.  No intracranial hemorrhage.  No skull fracture. Mucous retention cyst in the left maxillary sinus.  IMPRESSION: No acute intracranial abnormalities.  Moderate atrophy.   Electronically Signed   By: Amie Portland M.D.   On: 12/01/2012 14:42    EKG: Independently reviewed. Atrial fib  Assessment/Plan Active Problems:   A-fib   CLL (chronic lymphocytic leukemia)   ESRD on dialysis   UTI (urinary tract infection)   Sepsis   Metabolic encephalopathy   Neutropenia   Fall   1. Sepsis. Source at this point appears to be urinary tract. Treat with IV fluids and antibiotics. 2. Urinary tract infection. Patient has had multiple tract infection the last several months. Interestingly, he does appear to be Pseudomonas. We'll start the patient empirically on Zosyn for now. Followup urine cultures as well as blood cultures. May need urology consultation once his acute issues have resolved. 3. Metabolic encephalopathy. Likely secondary to sepsis/urinary tract infection. Continue to monitor. 4. End-stage renal disease on hemodialysis, Tuesday Thursday and Saturday. We'll request a nephrology consultation to assist in dialysis for tomorrow. 5. Leukopenia/neutropenia. This is secondary to infection at this time. He has not received any recent chemotherapy. We'll consider administering Neupogen if the patient's condition continues to deteriorate. 6. Chronic lymphocytic leukemia. It does not appear that he is a candidate for treatment in his current state. He'll need to followup  with his oncologist once his acute issues have resolved consider further treatment. 7. Chronic atrial fibrillation. Appears to be rate controlled. He's not on any anticoagulation due to history of significant bleeding.  Code Status: full code Family Communication: discussed with wife and daughter at the bedside Disposition Plan: pending hospital course  Time spent:  [facility] Pager [phone]  If 7PM-7AM, please contact night-coverage [email or web address] Password  [email or web address] 12/01/2012, 7:18 PM

## 2012-12-02 DIAGNOSIS — N186 End stage renal disease: Secondary | ICD-10-CM

## 2012-12-02 LAB — CBC WITH DIFFERENTIAL/PLATELET
Basophils Relative: 1 % (ref 0–1)
Basophils Relative: 0 % (ref 0–1)
Eosinophils Absolute: 0 10*3/uL (ref 0.0–0.7)
HCT: 31.5 % — ABNORMAL LOW (ref 39.0–52.0)
Hemoglobin: 9.6 g/dL — ABNORMAL LOW (ref 13.0–17.0)
Hemoglobin: 9.2 g/dL — ABNORMAL LOW (ref 13.0–17.0)
Lymphocytes Relative: 42 % (ref 12–46)
MCH: 26 pg (ref 26.0–34.0)
MCHC: 30 g/dL (ref 30.0–36.0)
MCV: 86.7 fL (ref 78.0–100.0)
Monocytes Relative: 51 % — ABNORMAL HIGH (ref 3–12)
Myelocytes: 1 %
Neutro Abs: 0.1 10*3/uL — ABNORMAL LOW (ref 1.7–7.7)
Neutrophils Relative %: 5 % — ABNORMAL LOW (ref 43–77)
Platelets: 239 10*3/uL (ref 150–400)
Promyelocytes Absolute: 3 %
RBC: 3.65 MIL/uL — ABNORMAL LOW (ref 4.22–5.81)
RBC: 3.54 MIL/uL — ABNORMAL LOW (ref 4.22–5.81)
RDW: 20.4 % — ABNORMAL HIGH (ref 11.5–15.5)
WBC: 1.3 10*3/uL — CL (ref 4.0–10.5)

## 2012-12-02 LAB — BASIC METABOLIC PANEL
BUN: 29 mg/dL — ABNORMAL HIGH (ref 6–23)
CO2: 22 mEq/L (ref 19–32)
Calcium: 10.3 mg/dL (ref 8.4–10.5)
Creatinine, Ser: 4.65 mg/dL — ABNORMAL HIGH (ref 0.50–1.35)
GFR calc Af Amer: 14 mL/min — ABNORMAL LOW (ref >90)
Glucose, Bld: 124 mg/dL — ABNORMAL HIGH (ref 70–99)

## 2012-12-02 LAB — HEPATITIS B SURFACE ANTIGEN: Hepatitis B Surface Ag: NEGATIVE

## 2012-12-02 LAB — GLUCOSE, CAPILLARY: Glucose-Capillary: 115 mg/dL — ABNORMAL HIGH (ref 70–99)

## 2012-12-02 LAB — CLOSTRIDIUM DIFFICILE BY PCR: Toxigenic C. Difficile by PCR: NEGATIVE

## 2012-12-02 MED ORDER — HEPARIN SODIUM (PORCINE) 1000 UNIT/ML DIALYSIS
500.0000 [IU] | INTRAMUSCULAR | Status: DC | PRN
  Filled 2012-12-02: qty 1

## 2012-12-02 MED ORDER — HEPARIN SODIUM (PORCINE) 1000 UNIT/ML DIALYSIS
20.0000 [IU]/kg | INTRAMUSCULAR | Status: DC | PRN
  Filled 2012-12-02: qty 3

## 2012-12-02 MED ORDER — PRO-STAT SUGAR FREE PO LIQD
30.0000 mL | Freq: Three times a day (TID) | ORAL | Status: DC
  Administered 2012-12-02 – 2012-12-05 (×6): 30 mL via ORAL
  Filled 2012-12-02 (×7): qty 30

## 2012-12-02 MED ORDER — SODIUM CHLORIDE 0.9 % IV SOLN: 100.0000 mL | INTRAVENOUS | Status: DC | PRN

## 2012-12-02 MED ORDER — EPOETIN ALFA 10000 UNIT/ML IJ SOLN
10000.0000 [IU] | INTRAMUSCULAR | Status: DC
  Administered 2012-12-02 – 2012-12-05 (×2): 10000 [IU] via INTRAVENOUS
  Filled 2012-12-02 (×3): qty 1

## 2012-12-02 MED ORDER — LIDOCAINE HCL (PF) 1 % IJ SOLN: 5.0000 mL | INTRAMUSCULAR | Status: DC | PRN

## 2012-12-02 MED ORDER — ALTEPLASE 2 MG IJ SOLR
INTRAMUSCULAR | Status: AC
  Filled 2012-12-02: qty 2

## 2012-12-02 MED ORDER — BOOST / RESOURCE BREEZE PO LIQD
1.0000 | Freq: Three times a day (TID) | ORAL | Status: DC
  Administered 2012-12-02 – 2012-12-05 (×9): 1 via ORAL

## 2012-12-02 MED ORDER — LIDOCAINE-PRILOCAINE 2.5-2.5 % EX CREA: 1.0000 "application " | TOPICAL_CREAM | CUTANEOUS | Status: DC | PRN

## 2012-12-02 MED ORDER — NEPRO/CARBSTEADY PO LIQD: 237.0000 mL | ORAL | Status: DC | PRN

## 2012-12-02 MED ORDER — LOPERAMIDE HCL 2 MG PO CAPS
2.0000 mg | ORAL_CAPSULE | ORAL | Status: DC | PRN
  Administered 2012-12-02 – 2012-12-04 (×3): 2 mg via ORAL
  Filled 2012-12-02 (×3): qty 1

## 2012-12-02 MED ORDER — HEPARIN SODIUM (PORCINE) 1000 UNIT/ML DIALYSIS
1000.0000 [IU] | INTRAMUSCULAR | Status: DC | PRN
  Filled 2012-12-02: qty 1

## 2012-12-02 MED ORDER — EPOETIN ALFA 10000 UNIT/ML IJ SOLN
INTRAMUSCULAR | Status: AC
  Filled 2012-12-02: qty 1

## 2012-12-02 MED ORDER — PENTAFLUOROPROP-TETRAFLUOROETH EX AERO
1.0000 "application " | INHALATION_SPRAY | CUTANEOUS | Status: DC | PRN
  Filled 2012-12-02: qty 103.5

## 2012-12-02 MED ORDER — ALTEPLASE 2 MG IJ SOLR
2.0000 mg | Freq: Once | INTRAMUSCULAR | Status: AC | PRN
  Administered 2012-12-02: 2 mg
  Filled 2012-12-02 (×2): qty 2

## 2012-12-02 NOTE — Progress Notes (Signed)
CRITICAL VALUE ALERT  Critical value received:  WBC - 1.1  Date of notification:  12/02/12  Time of notification:  0800  Critical value read back:yes  Nurse who received alert:  Sherrye Payor RN  MD notified (1st page):  Memon  Time of first page:  0810  MD notified (2nd page):  Time of second page:  Responding MD:  Kerry Hough  Time MD responded:  [id number]  MD notified, neutropenic precautions inititated

## 2012-12-02 NOTE — Procedures (Signed)
   HEMODIALYSIS TREATMENT NOTE:  4 hour low-heparin dialysis completed via right IJ tunneled catheter (exit site unremarkable).  Poor Qb through arterial port of catheter.  Alteplase instilled per standing orders.  Goal was NOT met:  BP unable to tolerate removal of 2 liters as ordered.  Ultrafiltration was interrupted for 2 hours and 2 minutes for asymptomatic hypotension (SBP<90).  Also, HD was interrupted due to a clotted extracorporeal circuit, despite NS flushes and low dose heparin.  Estimated blood loss of 200cc.  Will monitor Hgb trend. Net fluid removal of 529cc.  BP 98/52 post HD. Report given to Scotty Court, RN.  Mumtaz Lovins L. Lindsy Cerullo, RN CDN

## 2012-12-02 NOTE — Consult Note (Signed)
Reason for Consult: End-stage renal disease for hemodialysis. Referring Physician: Dr. [name] is an 67 y.o. male.  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 When he was evaluated patient was found to have urine tract infection admitted for further treatment. Presently patient denies any difficulty in breathing. overall feels good. According to patient his dialysis is Monday/ednesday/ Friday I last dialysis was on Wednesday and did not get dialysis yesterday. However his actual dialysis days are Tuesday/Thursday/Saturday and his last dialysis was on Thursday according to the dialysis unit.. Presently patient seems to be awake and also answers questions.  Past Medical History  Diagnosis Date  . Arthritis   . Hypertension   . DJD (degenerative joint disease)   . DJD (degenerative joint disease), ankle and foot   . Arthritis   . Cellulitis   . Arthritis   . Chronic back pain   . Gout   . Dysrhythmia     afibrillation  . Atrial fibrillation   . Chronic kidney disease   . Colon cancer     2005  . 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 Charcot ankle     dr. Emilio Math  . Bacteremia     Past Surgical History  Procedure Laterality Date  . Foot surgery      right foot-  . Colon surgery      forcolon cancer, 2005, Dr. Marcell Anger in [place]  . Tonsillectomy    . Cystoscopy with urethral dilatation    . Back surgery      x2  . Cataract extraction w/phaco  01/14/2011    Procedure: CATARACT EXTRACTION PHACO AND INTRAOCULAR LENS PLACEMENT (IOC);  Surgeon: Gemma Payor;  Location: [facility];  Service: Ophthalmology;  Laterality: Left;  CDE=16.27  . Lymph node biopsy Right 07/11/2012    Procedure: CERVICAL LYMPH NODE BIOPSY;  Surgeon: Marlane Hatcher, MD;  Location: [facility];  Service: General;   Laterality: Right;  end @ 1101  . Portacath placement Right 07/11/2012    Procedure: INSERTION PORT-A-CATH;  Surgeon: Marlane Hatcher, MD;  Location: [facility];  Service: General;  Laterality: Right;  . Port-a-cath removal Right 08/05/2012    Procedure: REMOVAL PORT-A-CATH;  Surgeon: Shelly Rubenstein, MD;  Location: [facility];  Service: General;  Laterality: Right;    Family History  Problem Relation Age of Onset  . Anesthesia problems Neg Hx   . Hypotension Neg Hx   . Malignant hyperthermia Neg Hx   . Pseudochol deficiency Neg Hx   . Breast cancer Mother   . Heart disease Mother     Social History:  reports that he has never smoked. He has never used smokeless tobacco. He reports that he does not drink alcohol or use illicit drugs.  Allergies:  Allergies  Allergen Reactions  . Ace Inhibitors Other (See Comments)    cough  . Rocephin [Ceftriaxone Sodium] Hives  . Beta Adrenergic Blockers Rash    Medications: I have reviewed the patient's current medications.  Results for orders placed during the hospital encounter of 12/01/12 (from the past 48 hour(s))  CBC WITH DIFFERENTIAL     Status: Abnormal   Collection Time    12/01/12  1:43 PM      Result Value Range   WBC 1.3 (*) 4.0 - 10.5 K/uL   Comment:  SPECIMEN CHECKED FOR CLOTS     REPEATED TO VERIFY   RBC 3.65 (*) 4.22 - 5.81 MIL/uL   Hemoglobin 9.6 (*) 13.0 - 17.0 g/dL   HCT 03.4 (*) 74.2 - 59.5 %   MCV 86.3  78.0 - 100.0 fL   MCH 26.3  26.0 - 34.0 pg   MCHC 30.5  30.0 - 36.0 g/dL   RDW 63.8 (*) 75.6 - 43.3 %   Platelets 238  150 - 400 K/uL   Neutrophils Relative % 5 (*) 43 - 77 %   Lymphocytes Relative 42  12 - 46 %   Monocytes Relative 51 (*) 3 - 12 %   Eosinophils Relative 1  0 - 5 %   Basophils Relative 1  0 - 1 %   Neutro Abs 0.1 (*) 1.7 - 7.7 K/uL   Lymphs Abs 0.5 (*) 0.7 - 4.0 K/uL   Monocytes Absolute 0.7  0.1 - 1.0 K/uL   Eosinophils Absolute 0.0  0.0 - 0.7 K/uL   Basophils Absolute 0.0  0.0 - 0.1 K/uL   RBC  Morphology TEARDROP CELLS     Comment: POLYCHROMASIA PRESENT     ELLIPTOCYTES     STOMATOCYTES  COMPREHENSIVE METABOLIC PANEL     Status: Abnormal   Collection Time    12/01/12  1:43 PM      Result Value Range   Sodium 139  135 - 145 mEq/L   Potassium 3.4 (*) 3.5 - 5.1 mEq/L   Chloride 98  96 - 112 mEq/L   CO2 25  19 - 32 mEq/L   Glucose, Bld 105 (*) 70 - 99 mg/dL   BUN 21  6 - 23 mg/dL   Creatinine, Ser 2.95 (*) 0.50 - 1.35 mg/dL   Calcium 18.8  8.4 - 41.6 mg/dL   Total Protein 6.2  6.0 - 8.3 g/dL   Albumin 2.8 (*) 3.5 - 5.2 g/dL   AST 18  0 - 37 U/L   ALT 12  0 - 53 U/L   Alkaline Phosphatase 83  39 - 117 U/L   Total Bilirubin 0.5  0.3 - 1.2 mg/dL   GFR calc non Af Amer 15 (*) >90 mL/min   GFR calc Af Amer 18 (*) >90 mL/min   Comment: (NOTE)     The eGFR has been calculated using the CKD EPI equation.     This calculation has not been validated in all clinical situations.     eGFR's persistently <90 mL/min signify possible Chronic Kidney     Disease.  LIPASE, BLOOD     Status: None   Collection Time    12/01/12  1:43 PM      Result Value Range   Lipase 23  11 - 59 U/L  TROPONIN I     Status: None   Collection Time    12/01/12  1:43 PM      Result Value Range   Troponin I <0.30  <0.30 ng/mL   Comment:            Due to the release kinetics of cTnI,     a negative result within the first hours     of the onset of symptoms does not rule out     myocardial infarction with certainty.     If myocardial infarction is still suspected,     repeat the test at appropriate intervals.  LACTIC ACID, PLASMA     Status: None   Collection Time  12/01/12  1:44 PM      Result Value Range   Lactic Acid, Venous 1.7  0.5 - 2.2 mmol/L  URINALYSIS, ROUTINE W REFLEX MICROSCOPIC     Status: Abnormal   Collection Time    12/01/12  3:15 PM      Result Value Range   Color, Urine BROWN (*) YELLOW   Comment: BIOCHEMICALS MAY BE AFFECTED BY COLOR   APPearance CLOUDY (*) CLEAR   Specific  Gravity, Urine >1.030 (*) 1.005 - 1.030   pH 5.0  5.0 - 8.0   Glucose, UA NEGATIVE  NEGATIVE mg/dL   Hgb urine dipstick NEGATIVE  NEGATIVE   Bilirubin Urine SMALL (*) NEGATIVE   Ketones, ur 15 (*) NEGATIVE mg/dL   Protein, ur TRACE (*) NEGATIVE mg/dL   Urobilinogen, UA 0.2  0.0 - 1.0 mg/dL   Nitrite NEGATIVE  NEGATIVE   Leukocytes, UA TRACE (*) NEGATIVE  URINE MICROSCOPIC-ADD ON     Status: Abnormal   Collection Time    12/01/12  3:15 PM      Result Value Range   WBC, UA TOO NUMEROUS TO COUNT  <3 WBC/hpf   RBC / HPF 21-50  <3 RBC/hpf   Bacteria, UA FEW (*) RARE  CULTURE, BLOOD (ROUTINE X 2)     Status: None   Collection Time    12/01/12  5:10 PM      Result Value Range   Specimen Description LEFT ANTECUBITAL     Special Requests BOTTLES DRAWN AEROBIC AND ANAEROBIC  15 CC EAC     Culture NO GROWTH 1 DAY     Report Status PENDING    CULTURE, BLOOD (ROUTINE X 2)     Status: None   Collection Time    12/01/12  5:13 PM      Result Value Range   Specimen Description RIGHT ANTECUBITAL     Special Requests BOTTLES DRAWN AEROBIC AND ANAEROBIC 15 CC EACH     Culture NO GROWTH 1 DAY     Report Status PENDING    GLUCOSE, CAPILLARY     Status: Abnormal   Collection Time    12/01/12 10:19 PM      Result Value Range   Glucose-Capillary 122 (*) 70 - 99 mg/dL   Comment 1 Notify RN    MRSA PCR SCREENING     Status: Abnormal   Collection Time    12/02/12  2:46 AM      Result Value Range   MRSA by PCR POSITIVE (*) NEGATIVE   Comment:            The GeneXpert MRSA Assay (FDA     approved for NASAL specimens     only), is one component of a     comprehensive MRSA colonization     surveillance program. It is not     intended to diagnose MRSA     infection nor to guide or     monitor treatment for     MRSA infections.     RESULT CALLED TO, READ BACK BY AND VERIFIED WITH:     HANDY,C AT 0700 ON 12/02/12 BY MOSLEYJ  CLOSTRIDIUM DIFFICILE BY PCR     Status: None   Collection Time     12/02/12  4:10 AM      Result Value Range   C difficile by pcr NEGATIVE  NEGATIVE  BASIC METABOLIC PANEL     Status: Abnormal   Collection Time    12/02/12  6:08  AM      Result Value Range   Sodium 137  135 - 145 mEq/L   Potassium 3.3 (*) 3.5 - 5.1 mEq/L   Chloride 96  96 - 112 mEq/L   CO2 22  19 - 32 mEq/L   Glucose, Bld 124 (*) 70 - 99 mg/dL   BUN 29 (*) 6 - 23 mg/dL   Creatinine, Ser 8.65 (*) 0.50 - 1.35 mg/dL   Calcium 78.4  8.4 - 69.6 mg/dL   GFR calc non Af Amer 12 (*) >90 mL/min   GFR calc Af Amer 14 (*) >90 mL/min   Comment: (NOTE)     The eGFR has been calculated using the CKD EPI equation.     This calculation has not been validated in all clinical situations.     eGFR's persistently <90 mL/min signify possible Chronic Kidney     Disease.  CBC WITH DIFFERENTIAL     Status: Abnormal (Preliminary result)   Collection Time    12/02/12  6:08 AM      Result Value Range   WBC PENDING  4.0 - 10.5 K/uL   RBC 3.54 (*) 4.22 - 5.81 MIL/uL   Hemoglobin 9.2 (*) 13.0 - 17.0 g/dL   HCT 29.5 (*) 28.4 - 13.2 %   MCV 86.7  78.0 - 100.0 fL   MCH 26.0  26.0 - 34.0 pg   MCHC 30.0  30.0 - 36.0 g/dL   RDW 44.0 (*) 10.2 - 72.5 %   Platelets 239  150 - 400 K/uL   Neutrophils Relative % PENDING  43 - 77 %   Neutro Abs PENDING  1.7 - 7.7 K/uL   Band Neutrophils PENDING  0 - 10 %   Lymphocytes Relative PENDING  12 - 46 %   Lymphs Abs PENDING  0.7 - 4.0 K/uL   Monocytes Relative PENDING  3 - 12 %   Monocytes Absolute PENDING  0.1 - 1.0 K/uL   Eosinophils Relative PENDING  0 - 5 %   Eosinophils Absolute PENDING  0.0 - 0.7 K/uL   Basophils Relative PENDING  0 - 1 %   Basophils Absolute PENDING  0.0 - 0.1 K/uL   WBC Morphology PENDING     RBC Morphology PENDING     Smear Review PENDING     nRBC PENDING  0 /100 WBC   Metamyelocytes Relative PENDING     Myelocytes PENDING     Promyelocytes Absolute PENDING     Blasts PENDING    GLUCOSE, CAPILLARY     Status: Abnormal   Collection Time     12/02/12  7:45 AM      Result Value Range   Glucose-Capillary 120 (*) 70 - 99 mg/dL   Comment 1 Notify RN     Comment 2 Documented in Chart      Dg Chest 1 View  12/01/2012   CLINICAL DATA:  Fall, hypertension, atrial fibrillation  EXAM: CHEST - 1 VIEW  COMPARISON:  11/04/2012  FINDINGS: Stable severe cardiac enlargement. Vascular pattern normal. No infiltrate consolidation effusion or pneumothorax. Dual lumen central line on the right is stable. Bony thorax grossly intact.  IMPRESSION: No active disease.   Electronically Signed   By: Esperanza Heir M.D.   On: 12/01/2012 14:59   Dg Lumbar Spine Complete  12/01/2012   CLINICAL DATA:  Fall, back pain  EXAM: LUMBAR SPINE - COMPLETE 4+ VIEW  COMPARISON:  None.  FINDINGS: Mild convex left scoliosis. No fracture. L3-4,  L4-5, and L5-S1 show significant degenerative facet change. There is bilateral sacroiliac degenerative joint disease. There is severe L3-4 degenerative disc disease. There is moderate L4-5 and moderate to severe L5-S1 degenerative disc disease. There is no fracture.  IMPRESSION: Severe degenerative change.  No acute findings.   Electronically Signed   By: Esperanza Heir M.D.   On: 12/01/2012 15:00   Ct Head Wo Contrast  12/01/2012   CLINICAL DATA:  Larey Seat out of bed last evening.  EXAM: CT HEAD WITHOUT CONTRAST  TECHNIQUE: Contiguous axial images were obtained from the base of the skull through the vertex without intravenous contrast.  COMPARISON:  None  FINDINGS: Ventricles are normal in configuration. There is ventricular and sulcal enlargement reflecting moderate atrophy. No parenchymal masses or mass effect. No evidence of a cortical infarct. There are no extra-axial masses or abnormal fluid collections.  No intracranial hemorrhage.  No skull fracture. Mucous retention cyst in the left maxillary sinus.  IMPRESSION: No acute intracranial abnormalities.  Moderate atrophy.   Electronically Signed   By: Amie Portland M.D.   On:  12/01/2012 14:42    Review of Systems  Constitutional: Positive for fever. Negative for chills.  Respiratory: Negative for sputum production and shortness of breath.   Cardiovascular: Positive for leg swelling. Negative for chest pain and orthopnea.  Gastrointestinal: Negative for nausea, vomiting, abdominal pain and diarrhea.   Blood pressure 93/52, pulse 68, temperature 98.2 F (36.8 C), temperature source Oral, resp. rate 17, height 5\' 9"  (1.753 m), weight 107.5 kg (236 lb 15.9 oz), SpO2 96.00%. Physical Exam  Constitutional: No distress.  Eyes: No scleral icterus.  Neck: No JVD present.  Cardiovascular: Normal rate.   No murmur heard. Is regular heart rate  Respiratory: No respiratory distress. He has no wheezes.  GI: He exhibits no distension. There is no tenderness.  Musculoskeletal: He exhibits edema.  Neurological: He is alert.  Patient seems to be disoriented. He missed dialysis days are Monday Wednesday Friday but the dialysis unit the colon it is Tuesday/Thursday/Saturday. His last dialysis was on Thursday. He probably also it was 3 hours but is regular dialysis is about 4 hours    Assessment/Plan: Problem #1 end-stage renal disease is status post hemodialysis on Wednesday. Patient missed his dialysis yesterday. Presently patient denies any nausea or vomiting. Problem #2 CLL Problem #3 after fibrillation his heart rate is controlled Problem #4 for her as this is/dermatitis Problem #5 anemia his hemoglobin and hematocrit is low Problem #6 metabolic bone disease calcium is was in acceptable range phosphorus is not available. Problem #7 morbid obesity  Problem #8 degenerative joint disease. Plan: We'll make arrangements for patient to get dialysis today           We'll use 3k/2.5 calcium bath for 4 hours.           We'll use Epogen 10,000 units after each dialysis.            We'll try to remove about 2 L if his blood pressure tolerates.            We'll check his basic  metabolic panel phosphorus and CBC in the morning. Emmerson Shuffield S 12/02/2012, 8:26 AM

## 2012-12-02 NOTE — Progress Notes (Signed)
[facility] PROGRESS NOTE  Alex Petty:[id number] DOB: January 11, 1946 DOA: 12/01/2012 PCP: [name], MD  Assessment/Plan: 1. Sepsis. Patient has been afebrile but remains leukopenic and mildly hypotensive. Continue antibiotics at this time. Follow blood cultures. Patient did reportedly have some loose stools overnight, but C. difficile was found to be negative. 2. Urinary tract infection. Patient has history of repeated urinary tract infections with pseudomonas. He is empirically on Zosyn, followup urine culture. 3. End-stage renal disease on hemodialysis. Dialysis per nephrology. 4. Metabolic encephalopathy, likely secondary to urinary tract infection. Continue to monitor for improvement. 5. Leukopenia/neutropenia. Likely related to infection. Monitor for improvement with treatment. If he fails to improve weakened, may need hematology input. 6. Chronic lymphocytic leukemia. Followup with oncology once acute issues have resolved. 7. Chronic atrial fibrillation. Rate controlled. Not on Anticoagulation due to history of significant bleeding 8. Sacral wound. He was informed by staff the patient has significant sacral decubitus ulcer. This will be examined with the staff once patient has completed dialysis. Will likely need wound care consult.  Code Status: Full code Family Communication: No family present Disposition Plan: Pending hospital course   Consultants:  Nephrology  Procedures:  None  Antibiotics:  Zosyn 11/22>>  HPI/Subjective: Lethargic, answers questions slowly, falls back asleep, cannot provide much history  Objective: Filed Vitals:   12/02/12 1630  BP: 98/52  Pulse: 66  Temp: 98 F (36.7 C)  Resp: 22    Intake/Output Summary (Last 24 hours) at 12/02/12 1707 Last data filed at 12/02/12 1620  Gross per 24 hour  Intake 214.17 ml  Output    704 ml  Net -489.83 ml   Filed Weights   12/01/12 2100 12/02/12 1130 12/02/12 1630  Weight: 107.5 kg  (236 lb 15.9 oz) 106.1 kg (233 lb 14.5 oz) 105.5 kg (232 lb 9.4 oz)    Exam:   General:  Lethargic, answers questions slowly, falls asleep during conversation  Cardiovascular: S1, S2, regular rate and rhythm  Respiratory: Clear to auscultation bilaterally  Abdomen: Soft, nontender, positive bowel sounds  Musculoskeletal: Chronic pedal edema with venous stasis changes   Data Reviewed: Basic Metabolic Panel:  Recent Labs Lab 12/01/12 1343 12/02/12 0608  NA 139 137  K 3.4* 3.3*  CL 98 96  CO2 25 22  GLUCOSE 105* 124*  BUN 21 29*  CREATININE 3.78* 4.65*  CALCIUM 10.2 10.3   Liver Function Tests:  Recent Labs Lab 12/01/12 1343  AST 18  ALT 12  ALKPHOS 83  BILITOT 0.5  PROT 6.2  ALBUMIN 2.8*    Recent Labs Lab 12/01/12 1343  LIPASE 23   No results found for this basename: AMMONIA,  in the last 168 hours CBC:  Recent Labs Lab 12/01/12 1343 12/02/12 0608  WBC 1.3* 1.1*  NEUTROABS 0.1*  --   HGB 9.6* 9.2*  HCT 31.5* 30.7*  MCV 86.3 86.7  PLT 238 239   Cardiac Enzymes:  Recent Labs Lab 12/01/12 1343  TROPONINI <0.30   BNP (last 3 results)  Recent Labs  10/23/12 0400  PROBNP 3985.0*   CBG:  Recent Labs Lab 12/01/12 2219 12/02/12 0745  GLUCAP 122* 120*    Recent Results (from the past 240 hour(s))  CULTURE, BLOOD (ROUTINE X 2)     Status: None   Collection Time    12/01/12  5:10 PM      Result Value Range Status   Specimen Description LEFT ANTECUBITAL   Final   Special Requests BOTTLES DRAWN AEROBIC  AND ANAEROBIC  15 CC EAC   Final   Culture NO GROWTH 1 DAY   Final   Report Status PENDING   Incomplete  CULTURE, BLOOD (ROUTINE X 2)     Status: None   Collection Time    12/01/12  5:13 PM      Result Value Range Status   Specimen Description RIGHT ANTECUBITAL   Final   Special Requests BOTTLES DRAWN AEROBIC AND ANAEROBIC 15 CC EACH   Final   Culture NO GROWTH 1 DAY   Final   Report Status PENDING   Incomplete  MRSA PCR SCREENING      Status: Abnormal   Collection Time    12/02/12  2:46 AM      Result Value Range Status   MRSA by PCR POSITIVE (*) NEGATIVE Final   Comment:            The GeneXpert MRSA Assay (FDA     approved for NASAL specimens     only), is one component of a     comprehensive MRSA colonization     surveillance program. It is not     intended to diagnose MRSA     infection nor to guide or     monitor treatment for     MRSA infections.     RESULT CALLED TO, READ BACK BY AND VERIFIED WITH:     HANDY,C AT 0700 ON 12/02/12 BY MOSLEYJ  CLOSTRIDIUM DIFFICILE BY PCR     Status: None   Collection Time    12/02/12  4:10 AM      Result Value Range Status   C difficile by pcr NEGATIVE  NEGATIVE Final     Studies: Dg Chest 1 View  12/01/2012   CLINICAL DATA:  Fall, hypertension, atrial fibrillation  EXAM: CHEST - 1 VIEW  COMPARISON:  11/04/2012  FINDINGS: Stable severe cardiac enlargement. Vascular pattern normal. No infiltrate consolidation effusion or pneumothorax. Dual lumen central line on the right is stable. Bony thorax grossly intact.  IMPRESSION: No active disease.   Electronically Signed   By: Esperanza Heir M.D.   On: 12/01/2012 14:59   Dg Lumbar Spine Complete  12/01/2012   CLINICAL DATA:  Fall, back pain  EXAM: LUMBAR SPINE - COMPLETE 4+ VIEW  COMPARISON:  None.  FINDINGS: Mild convex left scoliosis. No fracture. L3-4, L4-5, and L5-S1 show significant degenerative facet change. There is bilateral sacroiliac degenerative joint disease. There is severe L3-4 degenerative disc disease. There is moderate L4-5 and moderate to severe L5-S1 degenerative disc disease. There is no fracture.  IMPRESSION: Severe degenerative change.  No acute findings.   Electronically Signed   By: Esperanza Heir M.D.   On: 12/01/2012 15:00   Ct Head Wo Contrast  12/01/2012   CLINICAL DATA:  Larey Seat out of bed last evening.  EXAM: CT HEAD WITHOUT CONTRAST  TECHNIQUE: Contiguous axial images were obtained from the base  of the skull through the vertex without intravenous contrast.  COMPARISON:  None  FINDINGS: Ventricles are normal in configuration. There is ventricular and sulcal enlargement reflecting moderate atrophy. No parenchymal masses or mass effect. No evidence of a cortical infarct. There are no extra-axial masses or abnormal fluid collections.  No intracranial hemorrhage.  No skull fracture. Mucous retention cyst in the left maxillary sinus.  IMPRESSION: No acute intracranial abnormalities.  Moderate atrophy.   Electronically Signed   By: Amie Portland M.D.   On: 12/01/2012 14:42    Scheduled Meds: .  allopurinol  100 mg Oral Daily  . [START ON 12/03/2012] digoxin  0.125 mg Oral QODAY  . epoetin (EPOGEN/PROCRIT) injection  10,000 Units Intravenous Q T,Th,Sa-HD  . feeding supplement (PRO-STAT SUGAR FREE 64)  30 mL Oral TID WC  . feeding supplement (RESOURCE BREEZE)  1 Container Oral TID BM  . heparin  5,000 Units Subcutaneous Q8H  . insulin aspart  0-15 Units Subcutaneous TID WC  . insulin aspart  0-5 Units Subcutaneous QHS  . metoCLOPramide (REGLAN) injection  10 mg Intravenous Q6H  . piperacillin-tazobactam (ZOSYN)  IV  2.25 g Intravenous Q8H  . polyethylene glycol  17 g Oral Daily  . pregabalin  75 mg Oral BID  . saccharomyces boulardii  250 mg Oral BID   Continuous Infusions: . sodium chloride 50 mL/hr at 12/02/12 0243    Active Problems:   A-fib   CLL (chronic lymphocytic leukemia)   ESRD on dialysis   UTI (urinary tract infection)   Sepsis   Metabolic encephalopathy   Neutropenia   Fall    Time spent:    MEMON,JEHANZEB  [facility] Pager [phone]. If 7PM-7AM, please contact night-coverage at [email or web address], password [facility] 12/02/2012, 5:07 PM  LOS: 1 day

## 2012-12-02 NOTE — Progress Notes (Signed)
INITIAL NUTRITION ASSESSMENT  DOCUMENTATION CODES Per approved criteria  -Morbid Obesity   INTERVENTION: 30 ml Prostat TID, which will provide 300 kcals and 45 grams protein.  RD to follow for diet advancement.   NUTRITION DIAGNOSIS: Inadequate oral intake related to increased nutrient needs due to wound healing as evidenced by stage II pressure ulcer, 12% wt loss x 1 month.   Goal: Pt will meet > 90% of estimated energy needs  Monitor:  PO intake, labs, weight changes, skin assessments, changes in status  Reason for Assessment: MST=5  67 y.o. male  Admitting Dx: <principal problem not specified>  ASSESSMENT: Pt is a resident of [facility] who was admitted for AMS and s/p fall. Noted pt has had 4 hospitalizations in the past 4 months.  Pt with ESRD on HD. Also with CLL, but not currently receiving treatment.  Patient and family unavailable at time of visit.  Wt hx revealed significant wt loss over the past 6 months; 87# (27%) wt loss x 6 months, 110# (32%) wt loss x 3 months, and 32# (12%) wt loss x 1 month, all of which are clinically significant.  Noted significant edema during previous hospitalizations with diuresis, which may be a contributing factor to weight loss, however, with multiple hospitalizations suspect inadequate intake may also be a contributing factor. No current PO data available.  Pt is currently on a clear liquid diet. Noted Resource Breeze po TID (each supplement provides 250 kcal and 9 grams of protein) and Nepro Shake po PRN (each supplement provides 425 kcal and 19 grams protein) have been ordered for pt. Unable to diagnose malnutrition at this time, however, pt is at high risk for malnutrition given increased nutrition needs for HD and pressure ulcer, multiple medical issues, and multiple previous prolonged hospitalizations.  Height: Ht Readings from Last 1 Encounters:  12/01/12 5\' 9"  (1.753 m)    Weight: Wt Readings from Last 1 Encounters:  12/02/12 233  lb 14.5 oz (106.1 kg)    Ideal Body Weight: 160#  % Ideal Body Weight: 146%  Wt Readings from Last 10 Encounters:  12/02/12 233 lb 14.5 oz (106.1 kg)  11/04/12 265 lb 14 oz (120.6 kg)  08/17/12 343 lb 0.6 oz (155.6 kg)  08/17/12 343 lb 0.6 oz (155.6 kg)  07/20/12 326 lb (147.873 kg)  07/19/12 326 lb (147.873 kg)  07/11/12 322 lb (146.058 kg)  07/11/12 322 lb (146.058 kg)  07/10/12 322 lb (146.058 kg)  06/28/12 323 lb (146.512 kg)    Usual Body Weight: 322#  % Usual Body Weight: 72%  BMI:  Body mass index is 34.53 kg/(m^2). Meets criteria for obesity, class I.   Estimated Nutritional Needs: Kcal: 1191-4782 kcals daily Protein: 159-212 grams daily Fluid: 3.2-3.7 L daily  Skin: stage II pressure ulcer to buttocks  Diet Order: Clear Liquid  EDUCATION NEEDS: -Education not appropriate at this time   Intake/Output Summary (Last 24 hours) at 12/02/12 1252 Last data filed at 12/02/12 0721  Gross per 24 hour  Intake 214.17 ml  Output    175 ml  Net  39.17 ml    Last BM: 12/02/2012  Labs:   Recent Labs Lab 12/01/12 1343 12/02/12 0608  NA 139 137  K 3.4* 3.3*  CL 98 96  CO2 25 22  BUN 21 29*  CREATININE 3.78* 4.65*  CALCIUM 10.2 10.3  GLUCOSE 105* 124*    CBG (last 3)   Recent Labs  12/01/12 2219 12/02/12 0745  GLUCAP 122* 120*  Scheduled Meds: . allopurinol  100 mg Oral Daily  . [START ON 12/03/2012] digoxin  0.125 mg Oral QODAY  . epoetin (EPOGEN/PROCRIT) injection  10,000 Units Intravenous Q T,Th,Sa-HD  . feeding supplement (RESOURCE BREEZE)  1 Container Oral TID BM  . heparin  5,000 Units Subcutaneous Q8H  . insulin aspart  0-15 Units Subcutaneous TID WC  . insulin aspart  0-5 Units Subcutaneous QHS  . metoCLOPramide (REGLAN) injection  10 mg Intravenous Q6H  . piperacillin-tazobactam (ZOSYN)  IV  2.25 g Intravenous Q8H  . polyethylene glycol  17 g Oral Daily  . pregabalin  75 mg Oral BID  . saccharomyces boulardii  250 mg Oral BID     Continuous Infusions: . sodium chloride 50 mL/hr at 12/02/12 0243    Past Medical History  Diagnosis Date  . Arthritis   . Hypertension   . DJD (degenerative joint disease)   . DJD (degenerative joint disease), ankle and foot   . Arthritis   . Cellulitis   . Arthritis   . Chronic back pain   . Gout   . Dysrhythmia     afibrillation  . Atrial fibrillation   . Chronic kidney disease   . Colon cancer     2005  . 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 Charcot ankle     dr. Emilio Math  . Bacteremia     Past Surgical History  Procedure Laterality Date  . Foot surgery      right foot-  . Colon surgery      forcolon cancer, 2005, Dr. Marcell Anger in [place]  . Tonsillectomy    . Cystoscopy with urethral dilatation    . Back surgery      x2  . Cataract extraction w/phaco  01/14/2011    Procedure: CATARACT EXTRACTION PHACO AND INTRAOCULAR LENS PLACEMENT (IOC);  Surgeon: Gemma Payor;  Location: [facility];  Service: Ophthalmology;  Laterality: Left;  CDE=16.27  . Lymph node biopsy Right 07/11/2012    Procedure: CERVICAL LYMPH NODE BIOPSY;  Surgeon: Marlane Hatcher, MD;  Location: [facility];  Service: General;  Laterality: Right;  end @ 1101  . Portacath placement Right 07/11/2012    Procedure: INSERTION PORT-A-CATH;  Surgeon: Marlane Hatcher, MD;  Location: [facility];  Service: General;  Laterality: Right;  . Port-a-cath removal Right 08/05/2012    Procedure: REMOVAL PORT-A-CATH;  Surgeon: Shelly Rubenstein, MD;  Location: [facility];  Service: General;  Laterality: Right;    Mikki Ziff A. Mayford Knife, RD, LDN Pager: [phone]

## 2012-12-03 DIAGNOSIS — C9110 Chronic lymphocytic leukemia of B-cell type not having achieved remission: Secondary | ICD-10-CM

## 2012-12-03 DIAGNOSIS — D709 Neutropenia, unspecified: Secondary | ICD-10-CM

## 2012-12-03 LAB — URINE CULTURE
Colony Count: NO GROWTH
Culture: NO GROWTH

## 2012-12-03 LAB — CBC
HCT: 27.9 % — ABNORMAL LOW (ref 39.0–52.0)
Hemoglobin: 8.3 g/dL — ABNORMAL LOW (ref 13.0–17.0)
MCH: 25.9 pg — ABNORMAL LOW (ref 26.0–34.0)
MCHC: 29.7 g/dL — ABNORMAL LOW (ref 30.0–36.0)
MCV: 87.2 fL (ref 78.0–100.0)
RDW: 20.3 % — ABNORMAL HIGH (ref 11.5–15.5)
WBC: 1.9 10*3/uL — ABNORMAL LOW (ref 4.0–10.5)

## 2012-12-03 LAB — GLUCOSE, CAPILLARY
Glucose-Capillary: 161 mg/dL — ABNORMAL HIGH (ref 70–99)
Glucose-Capillary: 104 mg/dL — ABNORMAL HIGH (ref 70–99)

## 2012-12-03 LAB — BASIC METABOLIC PANEL
BUN: 17 mg/dL (ref 6–23)
Chloride: 95 mEq/L — ABNORMAL LOW (ref 96–112)
Creatinine, Ser: 3.1 mg/dL — ABNORMAL HIGH (ref 0.50–1.35)
GFR calc Af Amer: 22 mL/min — ABNORMAL LOW (ref >90)
Glucose, Bld: 95 mg/dL (ref 70–99)
Potassium: 3 mEq/L — ABNORMAL LOW (ref 3.5–5.1)

## 2012-12-03 LAB — PHOSPHORUS: Phosphorus: 3.2 mg/dL (ref 2.3–4.6)

## 2012-12-03 MED ORDER — POTASSIUM CHLORIDE 10 MEQ/100ML IV SOLN
10.0000 meq | INTRAVENOUS | Status: AC
  Administered 2012-12-03 (×3): 10 meq via INTRAVENOUS
  Filled 2012-12-03 (×2): qty 100

## 2012-12-03 NOTE — Progress Notes (Signed)
[facility] PROGRESS NOTE  CONO GEBHARD OZH:[id number] DOB: [birth year] DOA: 12/01/2012 PCP: [name], MD  Assessment/Plan: 1. Sepsis. Patient has been afebrile but remains leukopenic and mildly hypotensive. Continue antibiotics at this time. Follow blood cultures. Patient did reportedly have some loose stools overnight, but C. difficile was found to be negative. 2. Urinary tract infection. Patient has history of repeated urinary tract infections with pseudomonas. He is empirically on Zosyn, followup urine culture. 3. End-stage renal disease on hemodialysis. Dialysis per nephrology. 4. Metabolic encephalopathy, likely secondary to urinary tract infection/sepsis . Continue to monitor for improvement. 5. Leukopenia/neutropenia. Likely related to infection. appears to be improving. Continue to monitor . 6. Chronic lymphocytic leukemia. Followup with oncology once acute issues have resolved. 7. Chronic atrial fibrillation. Rate controlled. Not on Anticoagulation due to history of significant bleeding 8. Sacral wound. Will request wound care consult, there do not appear to be any draining lesions. Started on vancomycin   Code Status: Full code Family Communication: Discussed with wife Disposition Plan: Pending hospital course   Consultants:  Nephrology  Procedures:  None  Antibiotics:  Zosyn 11/22>>  HPI/Subjective: Lethargic, answers questions slowly, falls back asleep, cannot provide much history  Objective: Filed Vitals:   12/03/12 1434  BP: 90/62  Pulse: 79  Temp: 97.8 F (36.6 C)  Resp: 20    Intake/Output Summary (Last 24 hours) at 12/03/12 1928 Last data filed at 12/03/12 1810  Gross per 24 hour  Intake   3010 ml  Output      1 ml  Net   3009 ml   Filed Weights   12/01/12 2100 12/02/12 1130 12/02/12 1630  Weight: 107.5 kg (236 lb 15.9 oz) 106.1 kg (233 lb 14.5 oz) 105.5 kg (232 lb 9.4 oz)    Exam:   General:  Lethargic, awakens to voice and  answers questions appropriately.  Cardiovascular: S1, S2, regular rate and rhythm  Respiratory: Clear to auscultation bilaterally  Abdomen: Soft, nontender, positive bowel sounds  Musculoskeletal: Chronic pedal edema with venous stasis changes   Data Reviewed: Basic Metabolic Panel:  Recent Labs Lab 12/01/12 1343 12/02/12 0608 12/03/12 0559  NA 139 137 135  K 3.4* 3.3* 3.0*  CL 98 96 95*  CO2 25 22 27   GLUCOSE 105* 124* 95  BUN 21 29* 17  CREATININE 3.78* 4.65* 3.10*  CALCIUM 10.2 10.3 8.9  PHOS  --   --  3.2   Liver Function Tests:  Recent Labs Lab 12/01/12 1343  AST 18  ALT 12  ALKPHOS 83  BILITOT 0.5  PROT 6.2  ALBUMIN 2.8*    Recent Labs Lab 12/01/12 1343  LIPASE 23   No results found for this basename: AMMONIA,  in the last 168 hours CBC:  Recent Labs Lab 12/01/12 1343 12/02/12 0608 12/03/12 0559  WBC 1.3* 1.1* 1.9*  NEUTROABS 0.1*  --   --   HGB 9.6* 9.2* 8.3*  HCT 31.5* 30.7* 27.9*  MCV 86.3 86.7 87.2  PLT 238 239 202   Cardiac Enzymes:  Recent Labs Lab 12/01/12 1343  TROPONINI <0.30   BNP (last 3 results)  Recent Labs  10/23/12 0400  PROBNP 3985.0*   CBG:  Recent Labs Lab 12/02/12 1711 12/02/12 2146 12/03/12 0741 12/03/12 1156 12/03/12 1645  GLUCAP 126* 115* 161* 104* 116*    Recent Results (from the past 240 hour(s))  URINE CULTURE     Status: None   Collection Time    12/01/12  3:15 PM  Result Value Range Status   Specimen Description URINE, CLEAN CATCH   Final   Special Requests NONE   Final   Culture  Setup Time     Final   Value: 12/02/2012 01:31     Performed at [facility] Count     Final   Value: NO GROWTH     Performed at [facility]   Culture     Final   Value: NO GROWTH     Performed at [facility]   Report Status 12/03/2012 FINAL   Final  CULTURE, BLOOD (ROUTINE X 2)     Status: None   Collection Time    12/01/12  5:10 PM      Result Value Range Status    Specimen Description LEFT ANTECUBITAL   Final   Special Requests BOTTLES DRAWN AEROBIC AND ANAEROBIC  15 CC EAC   Final   Culture NO GROWTH 2 DAYS   Final   Report Status PENDING   Incomplete  CULTURE, BLOOD (ROUTINE X 2)     Status: None   Collection Time    12/01/12  5:13 PM      Result Value Range Status   Specimen Description RIGHT ANTECUBITAL   Final   Special Requests BOTTLES DRAWN AEROBIC AND ANAEROBIC 15 CC EACH   Final   Culture NO GROWTH 2 DAYS   Final   Report Status PENDING   Incomplete  MRSA PCR SCREENING     Status: Abnormal   Collection Time    12/02/12  2:46 AM      Result Value Range Status   MRSA by PCR POSITIVE (*) NEGATIVE Final   Comment:            The GeneXpert MRSA Assay (FDA     approved for NASAL specimens     only), is one component of a     comprehensive MRSA colonization     surveillance program. It is not     intended to diagnose MRSA     infection nor to guide or     monitor treatment for     MRSA infections.     RESULT CALLED TO, READ BACK BY AND VERIFIED WITH:     HANDY,C AT 0700 ON 12/02/12 BY MOSLEYJ  CLOSTRIDIUM DIFFICILE BY PCR     Status: None   Collection Time    12/02/12  4:10 AM      Result Value Range Status   C difficile by pcr NEGATIVE  NEGATIVE Final     Studies: No results found.  Scheduled Meds: . allopurinol  100 mg Oral Daily  . digoxin  0.125 mg Oral QODAY  . epoetin (EPOGEN/PROCRIT) injection  10,000 Units Intravenous Q T,Th,Sa-HD  . feeding supplement (PRO-STAT SUGAR FREE 64)  30 mL Oral TID WC  . feeding supplement (RESOURCE BREEZE)  1 Container Oral TID BM  . heparin  5,000 Units Subcutaneous Q8H  . insulin aspart  0-15 Units Subcutaneous TID WC  . insulin aspart  0-5 Units Subcutaneous QHS  . metoCLOPramide (REGLAN) injection  10 mg Intravenous Q6H  . piperacillin-tazobactam (ZOSYN)  IV  2.25 g Intravenous Q8H  . pregabalin  75 mg Oral BID  . saccharomyces boulardii  250 mg Oral BID   Continuous  Infusions: . sodium chloride 50 mL/hr at 12/03/12 1810    Active Problems:   A-fib   CLL (chronic lymphocytic leukemia)   ESRD on dialysis  UTI (urinary tract infection)   Sepsis   Metabolic encephalopathy   Neutropenia   Fall    Time spent:    Lethia Donlon  [facility] Pager [phone]. If 7PM-7AM, please contact night-coverage at [email or web address], password [facility] 12/03/2012, 7:28 PM  LOS: 2 days

## 2012-12-03 NOTE — Progress Notes (Signed)
SHYNE LEHRKE  MRN: [id number]  DOB/AGE: [birth year] 67 y.o.  Primary Care Physician:H[facility], [facility], MD  Admit date: 12/01/2012  Chief Complaint:  Chief Complaint  Patient presents with  . Fall    S-Pt presented on  12/01/2012 with  Chief Complaint  Patient presents with  . Fall  .    Pt is lethargic offers no complaints.  Meds . allopurinol  100 mg Oral Daily  . digoxin  0.125 mg Oral QODAY  . epoetin (EPOGEN/PROCRIT) injection  10,000 Units Intravenous Q T,Th,Sa-HD  . feeding supplement (PRO-STAT SUGAR FREE 64)  30 mL Oral TID WC  . feeding supplement (RESOURCE BREEZE)  1 Container Oral TID BM  . heparin  5,000 Units Subcutaneous Q8H  . insulin aspart  0-15 Units Subcutaneous TID WC  . insulin aspart  0-5 Units Subcutaneous QHS  . metoCLOPramide (REGLAN) injection  10 mg Intravenous Q6H  . piperacillin-tazobactam (ZOSYN)  IV  2.25 g Intravenous Q8H  . pregabalin  75 mg Oral BID  . saccharomyces boulardii  250 mg Oral BID       Physical Exam: Vital signs in last 24 hours: Temp:  [97.2 F (36.2 C)-98.5 F (36.9 C)] 98.5 F (36.9 C) (11/23 0630) Pulse Rate:  [60-83] 81 (11/23 0630) Resp:  [18-22] 20 (11/23 0630) BP: (77-111)/(40-55) 96/52 mmHg (11/23 0638) SpO2:  [94 %-97 %] 94 % (11/23 0630) Weight:  [232 lb 9.4 oz (105.5 kg)-233 lb 14.5 oz (106.1 kg)] 232 lb 9.4 oz (105.5 kg) (11/22 1630) Weight change: -31 lb 15.5 oz (-14.5 kg) Last BM Date: 12/02/12  Intake/Output from previous day: 11/22 0701 - 11/23 0700 In: 1780 [P.O.:410; I.V.:1170; IV Piggyback:200] Out: 705 [Urine:175; Stool:1]     Physical Exam: General- pt is lethargic . Resp- No acute REsp distress, CTA B/L NO Rhonchi CVS- S1S2 regular in rate and rhythm GIT- BS+, soft, NT, ND EXT- Trace LE Edema, NO Cyanosis, Charcot changes + Access- Right PC   Lab Results: CBC  Recent Labs  12/02/12 0608 12/03/12 0559  WBC 1.1* 1.9*  HGB 9.2* 8.3*  HCT 30.7* 27.9*  PLT 239 202     BMET  Recent Labs  12/02/12 0608 12/03/12 0559  NA 137 135  K 3.3* 3.0*  CL 96 95*  CO2 22 27  GLUCOSE 124* 95  BUN 29* 17  CREATININE 4.65* 3.10*  CALCIUM 10.3 8.9    MICRO Recent Results (from the past 240 hour(s))  CULTURE, BLOOD (ROUTINE X 2)     Status: None   Collection Time    12/01/12  5:10 PM      Result Value Range Status   Specimen Description LEFT ANTECUBITAL   Final   Special Requests BOTTLES DRAWN AEROBIC AND ANAEROBIC  15 CC EAC   Final   Culture NO GROWTH 2 DAYS   Final   Report Status PENDING   Incomplete  CULTURE, BLOOD (ROUTINE X 2)     Status: None   Collection Time    12/01/12  5:13 PM      Result Value Range Status   Specimen Description RIGHT ANTECUBITAL   Final   Special Requests BOTTLES DRAWN AEROBIC AND ANAEROBIC 15 CC EACH   Final   Culture NO GROWTH 2 DAYS   Final   Report Status PENDING   Incomplete  MRSA PCR SCREENING     Status: Abnormal   Collection Time    12/02/12  2:46 AM      Result Value Range  Status   MRSA by PCR POSITIVE (*) NEGATIVE Final   Comment:            The GeneXpert MRSA Assay (FDA     approved for NASAL specimens     only), is one component of a     comprehensive MRSA colonization     surveillance program. It is not     intended to diagnose MRSA     infection nor to guide or     monitor treatment for     MRSA infections.     RESULT CALLED TO, READ BACK BY AND VERIFIED WITH:     HANDY,C AT 0700 ON 12/02/12 BY MOSLEYJ  CLOSTRIDIUM DIFFICILE BY PCR     Status: None   Collection Time    12/02/12  4:10 AM      Result Value Range Status   C difficile by pcr NEGATIVE  NEGATIVE Final      Lab Results  Component Value Date   CALCIUM 8.9 12/03/2012   PHOS 3.2 12/03/2012       Impression: 1)Renal  ESRD on HD On TTS schedule Pt dialyzed yesterday No need of Hd today   2)CVS- Hypotension -stable not on pressors .  3)Anemia HGb at goal (9--11) On EPO  4)CKD Mineral-Bone Disorder Phosphorus at  goal.   5)ID-admitted with sepsis/UTI ON ABX Primary MD following  6)Electrolytes Hypokalemic- Normonatremic  7)Acid base Co2 at goal  8) Onco Neutropenia CLL May need haem help   Plan:  No need of HD today Will replete k  Will continue current tx      Ndeye Tenorio S 12/03/2012, 7:32 AM

## 2012-12-03 NOTE — Progress Notes (Signed)
12/03/12 1932 Late entry for 1500. Pt assisted up to chair this afternoon at 1410 via lift and 3 nursing staff assist. Tolerated well. Some general discomfort to sacral area while sitting. Tylenol given as ordered PRN for pain. Wife at bedside. Chair alarm on for safety. Pt tolerated sitting up for approximately 45 minutes. Assisted back to bed per nurse tech assist and lift. bedalarm reapplied. Call light kept within reach. Notified Dr. Kerry Hough patient up to chair this afternoon. Morrie Sheldon Rainelle Sulewski,RN

## 2012-12-04 DIAGNOSIS — D72819 Decreased white blood cell count, unspecified: Secondary | ICD-10-CM

## 2012-12-04 LAB — CBC WITH DIFFERENTIAL/PLATELET
Basophils Relative: 3 % — ABNORMAL HIGH (ref 0–1)
Eosinophils Absolute: 0.2 10*3/uL (ref 0.0–0.7)
Eosinophils Relative: 5 % (ref 0–5)
HCT: 26.3 % — ABNORMAL LOW (ref 39.0–52.0)
Hemoglobin: 8 g/dL — ABNORMAL LOW (ref 13.0–17.0)
MCH: 26.1 pg (ref 26.0–34.0)
MCHC: 30.4 g/dL (ref 30.0–36.0)
MCV: 85.7 fL (ref 78.0–100.0)
Monocytes Absolute: 1 10*3/uL (ref 0.1–1.0)
Monocytes Relative: 25 % — ABNORMAL HIGH (ref 3–12)
Neutro Abs: 1.6 10*3/uL — ABNORMAL LOW (ref 1.7–7.7)
Neutrophils Relative %: 42 % — ABNORMAL LOW (ref 43–77)

## 2012-12-04 LAB — BASIC METABOLIC PANEL
BUN: 29 mg/dL — ABNORMAL HIGH (ref 6–23)
CO2: 22 mEq/L (ref 19–32)
Calcium: 8.8 mg/dL (ref 8.4–10.5)
Chloride: 98 mEq/L (ref 96–112)
Creatinine, Ser: 4.01 mg/dL — ABNORMAL HIGH (ref 0.50–1.35)
GFR calc Af Amer: 16 mL/min — ABNORMAL LOW (ref >90)
GFR calc non Af Amer: 14 mL/min — ABNORMAL LOW (ref >90)
Glucose, Bld: 96 mg/dL (ref 70–99)
Potassium: 3.3 mEq/L — ABNORMAL LOW (ref 3.5–5.1)
Sodium: 136 mEq/L (ref 135–145)

## 2012-12-04 LAB — GLUCOSE, CAPILLARY: Glucose-Capillary: 119 mg/dL — ABNORMAL HIGH (ref 70–99)

## 2012-12-04 MED ORDER — CHLORHEXIDINE GLUCONATE CLOTH 2 % EX PADS
6.0000 | MEDICATED_PAD | Freq: Every day | CUTANEOUS | Status: DC
  Administered 2012-12-04 – 2012-12-05 (×2): 6 via TOPICAL

## 2012-12-04 MED ORDER — VANCOMYCIN HCL 10 G IV SOLR
1500.0000 mg | Freq: Once | INTRAVENOUS | Status: AC
  Administered 2012-12-04: 1500 mg via INTRAVENOUS
  Filled 2012-12-04: qty 1500

## 2012-12-04 MED ORDER — MUPIROCIN 2 % EX OINT
1.0000 "application " | TOPICAL_OINTMENT | Freq: Two times a day (BID) | CUTANEOUS | Status: DC
  Administered 2012-12-04 – 2012-12-05 (×3): 1 via NASAL
  Filled 2012-12-04: qty 22

## 2012-12-04 NOTE — Progress Notes (Signed)
[facility] PROGRESS NOTE  Alex Petty ZOX:[id number] DOB: April 25, 1945 DOA: 12/01/2012 PCP: [name], MD  Assessment/Plan: 1. Sepsis. Blood cultures and urine culture show no growth. Clinically appears to be improving. He is afebrile. Leukopenia is improving 2. Cellulitis of sacral area. Appears to be improved with antibiotics. Start vancomycin for better gram-positive coverage 3. Urinary tract infection. Current urine culture does not show any growth. We'll discontinue Zosyn.. 4. End-stage renal disease on hemodialysis. Dialysis per nephrology. 5. Metabolic encephalopathy, likely secondary to urinary tract infection/sepsis . Continue to monitor for improvement. 6. Leukopenia/neutropenia. Likely related to infection. appears to be improving. Continue to monitor . 7. Chronic lymphocytic leukemia. Followup with oncology once acute issues have resolved. 8. Chronic atrial fibrillation. Rate controlled. Not on Anticoagulation due to history of significant bleeding 9. Sacral wound. Continue local skin care, erythema of sacral wound is improving  Code Status: Full code Family Communication: Discussed with wife Possible discharge to skilled nursing facility tomorrow   Consultants:  Nephrology  Procedures:  None  Antibiotics:  Zosyn 11/22>>  HPI/Subjective: Appears to be more awake today. Denies any complaints.  Objective: Filed Vitals:   12/04/12 1511  BP: 124/62  Pulse: 72  Temp: 98.3 F (36.8 C)  Resp: 20    Intake/Output Summary (Last 24 hours) at 12/04/12 1919 Last data filed at 12/04/12 0600  Gross per 24 hour  Intake 811.67 ml  Output      0 ml  Net 811.67 ml   Filed Weights   12/01/12 2100 12/02/12 1130 12/02/12 1630  Weight: 107.5 kg (236 lb 15.9 oz) 106.1 kg (233 lb 14.5 oz) 105.5 kg (232 lb 9.4 oz)    Exam:   General:  Lethargic, awakens to voice and answers questions appropriately.  Cardiovascular: S1, S2, regular rate and  rhythm  Respiratory: Clear to auscultation bilaterally  Abdomen: Soft, nontender, positive bowel sounds  Musculoskeletal: Chronic pedal edema with venous stasis changes   Skin: erythema over sacral area is improving  Data Reviewed: Basic Metabolic Panel:  Recent Labs Lab 12/01/12 1343 12/02/12 0608 12/03/12 0559 12/04/12 0517  NA 139 137 135 136  K 3.4* 3.3* 3.0* 3.3*  CL 98 96 95* 98  CO2 25 22 27 22   GLUCOSE 105* 124* 95 96  BUN 21 29* 17 29*  CREATININE 3.78* 4.65* 3.10* 4.01*  CALCIUM 10.2 10.3 8.9 8.8  PHOS  --   --  3.2  --    Liver Function Tests:  Recent Labs Lab 12/01/12 1343  AST 18  ALT 12  ALKPHOS 83  BILITOT 0.5  PROT 6.2  ALBUMIN 2.8*    Recent Labs Lab 12/01/12 1343  LIPASE 23   No results found for this basename: AMMONIA,  in the last 168 hours CBC:  Recent Labs Lab 12/01/12 1343 12/02/12 0608 12/03/12 0559 12/04/12 0517  WBC 1.3* 1.1* 1.9* 3.9*  NEUTROABS 0.1*  --   --  1.6*  HGB 9.6* 9.2* 8.3* 8.0*  HCT 31.5* 30.7* 27.9* 26.3*  MCV 86.3 86.7 87.2 85.7  PLT 238 239 202 215   Cardiac Enzymes:  Recent Labs Lab 12/01/12 1343  TROPONINI <0.30   BNP (last 3 results)  Recent Labs  10/23/12 0400  PROBNP 3985.0*   CBG:  Recent Labs Lab 12/03/12 1645 12/03/12 2205 12/04/12 0744 12/04/12 1208 12/04/12 1646  GLUCAP 116* 110* 94 103* 119*    Recent Results (from the past 240 hour(s))  URINE CULTURE     Status: None  Collection Time    12/01/12  3:15 PM      Result Value Range Status   Specimen Description URINE, CLEAN CATCH   Final   Special Requests NONE   Final   Culture  Setup Time     Final   Value: 12/02/2012 01:31     Performed at [facility] Count     Final   Value: NO GROWTH     Performed at [facility]   Culture     Final   Value: NO GROWTH     Performed at [facility]   Report Status 12/03/2012 FINAL   Final  CULTURE, BLOOD (ROUTINE X 2)     Status: None    Collection Time    12/01/12  5:10 PM      Result Value Range Status   Specimen Description LEFT ANTECUBITAL   Final   Special Requests BOTTLES DRAWN AEROBIC AND ANAEROBIC  15 CC EAC   Final   Culture NO GROWTH 3 DAYS   Final   Report Status PENDING   Incomplete  CULTURE, BLOOD (ROUTINE X 2)     Status: None   Collection Time    12/01/12  5:13 PM      Result Value Range Status   Specimen Description RIGHT ANTECUBITAL   Final   Special Requests BOTTLES DRAWN AEROBIC AND ANAEROBIC 15 CC EACH   Final   Culture NO GROWTH 3 DAYS   Final   Report Status PENDING   Incomplete  MRSA PCR SCREENING     Status: Abnormal   Collection Time    12/02/12  2:46 AM      Result Value Range Status   MRSA by PCR POSITIVE (*) NEGATIVE Final   Comment:            The GeneXpert MRSA Assay (FDA     approved for NASAL specimens     only), is one component of a     comprehensive MRSA colonization     surveillance program. It is not     intended to diagnose MRSA     infection nor to guide or     monitor treatment for     MRSA infections.     RESULT CALLED TO, READ BACK BY AND VERIFIED WITH:     HANDY,C AT 0700 ON 12/02/12 BY MOSLEYJ  CLOSTRIDIUM DIFFICILE BY PCR     Status: None   Collection Time    12/02/12  4:10 AM      Result Value Range Status   C difficile by pcr NEGATIVE  NEGATIVE Final     Studies: No results found.  Scheduled Meds: . allopurinol  100 mg Oral Daily  . Chlorhexidine Gluconate Cloth  6 each Topical Q0600  . digoxin  0.125 mg Oral QODAY  . epoetin (EPOGEN/PROCRIT) injection  10,000 Units Intravenous Q T,Th,Sa-HD  . feeding supplement (PRO-STAT SUGAR FREE 64)  30 mL Oral TID WC  . feeding supplement (RESOURCE BREEZE)  1 Container Oral TID BM  . heparin  5,000 Units Subcutaneous Q8H  . insulin aspart  0-15 Units Subcutaneous TID WC  . insulin aspart  0-5 Units Subcutaneous QHS  . metoCLOPramide (REGLAN) injection  10 mg Intravenous Q6H  . mupirocin ointment  1 application  Nasal BID  . piperacillin-tazobactam (ZOSYN)  IV  2.25 g Intravenous Q8H  . pregabalin  75 mg Oral BID  . saccharomyces boulardii  250 mg Oral BID  Continuous Infusions: . sodium chloride 50 mL/hr at 12/04/12 1430    Active Problems:   A-fib   CLL (chronic lymphocytic leukemia)   ESRD on dialysis   UTI (urinary tract infection)   Sepsis   Metabolic encephalopathy   Neutropenia   Fall    Time spent:    MEMON,JEHANZEB  [facility] Pager [phone]. If 7PM-7AM, please contact night-coverage at [email or web address], password [facility] 12/04/2012, 7:19 PM  LOS: 3 days

## 2012-12-04 NOTE — Clinical Social Work Psychosocial (Signed)
Clinical Social Work Department BRIEF PSYCHOSOCIAL ASSESSMENT 12/04/2012  Patient:  Alex Petty, Alex Petty     Account Number:  [id number]     Admit date:  12/01/2012  Clinical Social Worker:  Nancie Neas  Date/Time:  12/04/2012 10:20 AM  Referred by:  CSW  Date Referred:  12/04/2012 Referred for  SNF Placement   Other Referral:   Interview type:  Family Other interview type:   Corinda and Christy    PSYCHOSOCIAL DATA Living Status:  FACILITY Admitted from facility:  [facility] Level of care:  Skilled Nursing Facility Primary support name:  Corinda Primary support relationship to patient:  SPOUSE Degree of support available:   supportive    CURRENT CONCERNS Current Concerns  Post-Acute Placement   Other Concerns:    SOCIAL WORK ASSESSMENT / PLAN CSW met with pt's wife and daughter, Neysa Bonito at bedside. Pt sleeping during assessment. Family appears to be very involved and supportive. Pt has been a resident at [facility] for just over a month. He was admitted to [facility] at the end of July and has not been able to return home since. He was diagnosed with chronic lymphocytic leukemia in early July and did one round of treatment. His daughter states this "wiped him out." Pt was sent to [facility] from [facility] and was there several months prior to tranfer to [facility]. Family report that pt fell out of the bed at [facility] and was brought to ED. Admitted with UTI. Family have not been pleased with care at [facility] and request new facility. CSW discussed placement process and provided SNF list. Request [facility] or [facility] SNF. Pt began dialysis in August and is a Tuesday, Thursday, Saturday schedule at [facility] in [place]. Family aware that he will most likely need to switch to [facility] if he goes to [facility]. Debbie at [facility] reports okay to return if needed at d/c.   Assessment/plan status:  Psychosocial Support/Ongoing Assessment of Needs Other assessment/ plan:    Information/referral to community resources:   SNF list    PATIENT'S/FAMILY'S RESPONSE TO PLAN OF CARE: Pt unable to discuss plan of care at this time. Family request new SNF as they do not feel that [facility] is meeting his needs and are not pleased with progress in PT. CSW will initiate new bed search and follow up with any offers when available.       Derenda Fennel, [place] [phone]

## 2012-12-04 NOTE — Clinical Social Work Placement (Signed)
Clinical Social Work Department CLINICAL SOCIAL WORK PLACEMENT NOTE 12/04/2012  Patient:  Alex Petty, Alex Petty  Account Number:  [id number] Admit date:  12/01/2012  Clinical Social Worker:  Sherrlyn Hock  Date/time:  12/04/2012 10:40 AM  Clinical Social Work is seeking post-discharge placement for this patient at the following level of care:   SKILLED NURSING   (*CSW will update this form in Epic as items are completed)   12/04/2012  Patient/family provided with [facility] Department of Clinical Social Work's list of facilities offering this level of care within the geographic area requested by the patient (or if unable, by the patient's family).  12/04/2012  Patient/family informed of their freedom to choose among providers that offer the needed level of care, that participate in Medicare, Medicaid or managed care program needed by the patient, have an available bed and are willing to accept the patient.  12/04/2012  Patient/family informed of [facility]' ownership interest in [facility], as well as of the fact that they are under no obligation to receive care at this facility.  PASARR submitted to EDS on  PASARR number received from EDS on   FL2 transmitted to all facilities in geographic area requested by pt/family on  12/04/2012 FL2 transmitted to all facilities within larger geographic area on   Patient informed that his/her managed care company has contracts with or will negotiate with  certain facilities, including the following:     Patient/family informed of bed offers received:   Patient chooses bed at  Physician recommends and patient chooses bed at    Patient to be transferred to  on   Patient to be transferred to facility by   The following physician request were entered in Epic:   Additional Comments: Pt has existing pasarr.  Derenda Fennel, [place] [phone]

## 2012-12-04 NOTE — Progress Notes (Signed)
ANTIBIOTIC CONSULT NOTE - INITIAL  Pharmacy Consult for Vancomycin Indication: Cellulitis   Allergies  Allergen Reactions  . Ace Inhibitors Other (See Comments)    cough  . Rocephin [Ceftriaxone Sodium] Hives  . Beta Adrenergic Blockers Rash    Patient Measurements: Height: 5\' 9"  (175.3 cm) Weight: 232 lb 9.4 oz (105.5 kg) IBW/kg (Calculated) : 70.7 Adjusted Body Weight: 84.6 kg  Vital Signs: Temp: 98.3 F (36.8 C) (11/24 1511) Temp src: Axillary (11/24 1511) BP: 124/62 mmHg (11/24 1511) Pulse Rate: 72 (11/24 1511) Intake/Output from previous day: 11/23 0701 - 11/24 0700 In: 2361.7 [P.O.:720; I.V.:1191.7; IV Piggyback:450] Out: -  Intake/Output from this shift:    Labs:  Recent Labs  12/02/12 0608 12/03/12 0559 12/04/12 0517  WBC 1.1* 1.9* 3.9*  HGB 9.2* 8.3* 8.0*  PLT 239 202 215  CREATININE 4.65* 3.10* 4.01*   Estimated Creatinine Clearance: 21.4 ml/min (by C-G formula based on Cr of 4.01). No results found for this basename: VANCOTROUGH, Leodis Binet, VANCORANDOM, GENTTROUGH, GENTPEAK, GENTRANDOM, TOBRATROUGH, TOBRAPEAK, TOBRARND, AMIKACINPEAK, AMIKACINTROU, AMIKACIN,  in the last 72 hours   Microbiology: Recent Results (from the past 720 hour(s))  URINE CULTURE     Status: None   Collection Time    12/01/12  3:15 PM      Result Value Range Status   Specimen Description URINE, CLEAN CATCH   Final   Special Requests NONE   Final   Culture  Setup Time     Final   Value: 12/02/2012 01:31     Performed at [facility] Count     Final   Value: NO GROWTH     Performed at [facility]   Culture     Final   Value: NO GROWTH     Performed at [facility]   Report Status 12/03/2012 FINAL   Final  CULTURE, BLOOD (ROUTINE X 2)     Status: None   Collection Time    12/01/12  5:10 PM      Result Value Range Status   Specimen Description LEFT ANTECUBITAL   Final   Special Requests BOTTLES DRAWN AEROBIC AND ANAEROBIC  15 CC EAC    Final   Culture NO GROWTH 3 DAYS   Final   Report Status PENDING   Incomplete  CULTURE, BLOOD (ROUTINE X 2)     Status: None   Collection Time    12/01/12  5:13 PM      Result Value Range Status   Specimen Description RIGHT ANTECUBITAL   Final   Special Requests BOTTLES DRAWN AEROBIC AND ANAEROBIC 15 CC EACH   Final   Culture NO GROWTH 3 DAYS   Final   Report Status PENDING   Incomplete  MRSA PCR SCREENING     Status: Abnormal   Collection Time    12/02/12  2:46 AM      Result Value Range Status   MRSA by PCR POSITIVE (*) NEGATIVE Final   Comment:            The GeneXpert MRSA Assay (FDA     approved for NASAL specimens     only), is one component of a     comprehensive MRSA colonization     surveillance program. It is not     intended to diagnose MRSA     infection nor to guide or     monitor treatment for     MRSA infections.     RESULT  CALLED TO, READ BACK BY AND VERIFIED WITH:     HANDY,C AT 0700 ON 12/02/12 BY MOSLEYJ  CLOSTRIDIUM DIFFICILE BY PCR     Status: None   Collection Time    12/02/12  4:10 AM      Result Value Range Status   C difficile by pcr NEGATIVE  NEGATIVE Final    Medical History: Past Medical History  Diagnosis Date  . Arthritis   . Hypertension   . DJD (degenerative joint disease)   . DJD (degenerative joint disease), ankle and foot   . Arthritis   . Cellulitis   . Arthritis   . Chronic back pain   . Gout   . Dysrhythmia     afibrillation  . Atrial fibrillation   . Chronic kidney disease   . Colon cancer     2005  . 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 Charcot ankle     dr. Emilio Math  . Bacteremia     Medications:  Scheduled:  . allopurinol  100 mg Oral Daily  . Chlorhexidine Gluconate Cloth  6 each Topical Q0600  . digoxin  0.125 mg Oral QODAY  . epoetin (EPOGEN/PROCRIT) injection  10,000 Units Intravenous Q T,Th,Sa-HD  . feeding supplement (PRO-STAT SUGAR FREE 64)  30 mL Oral TID WC  . feeding supplement  (RESOURCE BREEZE)  1 Container Oral TID BM  . heparin  5,000 Units Subcutaneous Q8H  . insulin aspart  0-15 Units Subcutaneous TID WC  . insulin aspart  0-5 Units Subcutaneous QHS  . metoCLOPramide (REGLAN) injection  10 mg Intravenous Q6H  . mupirocin ointment  1 application Nasal BID  . pregabalin  75 mg Oral BID  . saccharomyces boulardii  250 mg Oral BID  . vancomycin  1,500 mg Intravenous Once   Assessment: Cellulitis of sacral area Dialysis patient  Goal of Therapy:  Vancomycin post-HD level 5-15 mcg/ml  Plan:  Vancomycin 1500 mg IV loading dose tonight Pharmacy to follow with additional dosing after dialysis Dialysis days presently Tues, Thurs, Sat Labs per protocol  Alex Petty, Alex Petty 12/04/2012,7:50 PM

## 2012-12-04 NOTE — Progress Notes (Addendum)
Alex Petty  MRN: [id number]  DOB/AGE: [birth year] 67 y.o.  Primary Care Physician:HALL, [facility], MD  Admit date: 12/01/2012  Chief Complaint:  Chief Complaint  Patient presents with  . Fall    S-Pt presented on  12/01/2012 with  Chief Complaint  Patient presents with  . Fall  .    Pt offers no complaints.    Pt more alert than yesterday    Pt wife present in room and she had questions about changing her dialysis center/MD   Meds . allopurinol  100 mg Oral Daily  . Chlorhexidine Gluconate Cloth  6 each Topical Q0600  . digoxin  0.125 mg Oral QODAY  . epoetin (EPOGEN/PROCRIT) injection  10,000 Units Intravenous Q T,Th,Sa-HD  . feeding supplement (PRO-STAT SUGAR FREE 64)  30 mL Oral TID WC  . feeding supplement (RESOURCE BREEZE)  1 Container Oral TID BM  . heparin  5,000 Units Subcutaneous Q8H  . insulin aspart  0-15 Units Subcutaneous TID WC  . insulin aspart  0-5 Units Subcutaneous QHS  . metoCLOPramide (REGLAN) injection  10 mg Intravenous Q6H  . mupirocin ointment  1 application Nasal BID  . piperacillin-tazobactam (ZOSYN)  IV  2.25 g Intravenous Q8H  . pregabalin  75 mg Oral BID  . saccharomyces boulardii  250 mg Oral BID       Physical Exam: Vital signs in last 24 hours: Temp:  [98.2 F (36.8 C)-98.3 F (36.8 C)] 98.3 F (36.8 C) (11/24 1511) Pulse Rate:  [70-72] 72 (11/24 1511) Resp:  [20] 20 (11/24 1511) BP: (80-124)/(62-68) 124/62 mmHg (11/24 1511) SpO2:  [93 %-98 %] 93 % (11/24 1511) Weight change:  Last BM Date: 12/03/12  Intake/Output from previous day: 11/23 0701 - 11/24 0700 In: 2361.7 [P.O.:720; I.V.:1191.7; IV Piggyback:450] Out: -      Physical Exam: General- pt is awake,aletr, follows comands( much better than yesterday) . Resp- No acute REsp distress, CTA B/L NO Rhonchi CVS- S1S2 regular in rate and rhythm GIT- BS+, soft, NT, ND EXT- Trace LE Edema, NO Cyanosis, Charcot changes + Access- Right PC   Lab Results: CBC  Recent  Labs  12/03/12 0559 12/04/12 0517  WBC 1.9* 3.9*  HGB 8.3* 8.0*  HCT 27.9* 26.3*  PLT 202 215    BMET  Recent Labs  12/03/12 0559 12/04/12 0517  NA 135 136  K 3.0* 3.3*  CL 95* 98  CO2 27 22  GLUCOSE 95 96  BUN 17 29*  CREATININE 3.10* 4.01*  CALCIUM 8.9 8.8    MICRO Recent Results (from the past 240 hour(s))  URINE CULTURE     Status: None   Collection Time    12/01/12  3:15 PM      Result Value Range Status   Specimen Description URINE, CLEAN CATCH   Final   Special Requests NONE   Final   Culture  Setup Time     Final   Value: 12/02/2012 01:31     Performed at [facility] Count     Final   Value: NO GROWTH     Performed at [facility]   Culture     Final   Value: NO GROWTH     Performed at [facility]   Report Status 12/03/2012 FINAL   Final  CULTURE, BLOOD (ROUTINE X 2)     Status: None   Collection Time    12/01/12  5:10 PM      Result Value  Range Status   Specimen Description LEFT ANTECUBITAL   Final   Special Requests BOTTLES DRAWN AEROBIC AND ANAEROBIC  15 CC EAC   Final   Culture NO GROWTH 3 DAYS   Final   Report Status PENDING   Incomplete  CULTURE, BLOOD (ROUTINE X 2)     Status: None   Collection Time    12/01/12  5:13 PM      Result Value Range Status   Specimen Description RIGHT ANTECUBITAL   Final   Special Requests BOTTLES DRAWN AEROBIC AND ANAEROBIC 15 CC EACH   Final   Culture NO GROWTH 3 DAYS   Final   Report Status PENDING   Incomplete  MRSA PCR SCREENING     Status: Abnormal   Collection Time    12/02/12  2:46 AM      Result Value Range Status   MRSA by PCR POSITIVE (*) NEGATIVE Final   Comment:            The GeneXpert MRSA Assay (FDA     approved for NASAL specimens     only), is one component of a     comprehensive MRSA colonization     surveillance program. It is not     intended to diagnose MRSA     infection nor to guide or     monitor treatment for     MRSA infections.     RESULT  CALLED TO, READ BACK BY AND VERIFIED WITH:     HANDY,C AT 0700 ON 12/02/12 BY MOSLEYJ  CLOSTRIDIUM DIFFICILE BY PCR     Status: None   Collection Time    12/02/12  4:10 AM      Result Value Range Status   C difficile by pcr NEGATIVE  NEGATIVE Final      Lab Results  Component Value Date   CALCIUM 8.8 12/04/2012   PHOS 3.2 12/03/2012       Impression: 1)Renal  ESRD on HD On TTS schedule No need of  HD today. Will dialyze in am  2)CVS- Hypotension -stable.  3)Anemia HGb at goal (9--11) On EPO  4)CKD Mineral-Bone Disorder Phosphorus at goal.   5)ID-admitted with sepsis/UTI ON ABX Primary MD following  6)Electrolytes Hypokalemic- Normonatremic  7)Acid base Co2 at goal  8) Onco Neutropenia CLL May need haem help   Plan:  Will do dialysis  in am  Will continue current tx For Hd center Pt wife will talk with Child psychotherapist.     Liala Codispoti S 12/04/2012, 4:09 PM

## 2012-12-04 NOTE — Clinical Social Work Note (Addendum)
CSW presented bed offer at [facility] which was family's first choice. [facility] will set up transport to dialysis through [facility] at wife's request and are aware that he will be billed charge. Possible d/c tomorrow per MD. Pt's wife plans to notify [facility].   Derenda Fennel, [place] [phone]

## 2012-12-04 NOTE — Clinical Social Work Placement (Signed)
Clinical Social Work Department CLINICAL SOCIAL WORK PLACEMENT NOTE 12/04/2012  Patient:  Alex Petty, Alex Petty  Account Number:  [id number] Admit date:  12/01/2012  Clinical Social Worker:  Derenda Fennel, LCSW  Date/time:  12/04/2012 10:40 AM  Clinical Social Work is seeking post-discharge placement for this patient at the following level of care:   SKILLED NURSING   (*CSW will update this form in Epic as items are completed)   12/04/2012  Patient/family provided with [facility] Department of Clinical Social Work's list of facilities offering this level of care within the geographic area requested by the patient (or if unable, by the patient's family).  12/04/2012  Patient/family informed of their freedom to choose among providers that offer the needed level of care, that participate in Medicare, Medicaid or managed care program needed by the patient, have an available bed and are willing to accept the patient.  12/04/2012  Patient/family informed of [facility]' ownership interest in [facility], as well as of the fact that they are under no obligation to receive care at this facility.  PASARR submitted to EDS on  PASARR number received from EDS on   FL2 transmitted to all facilities in geographic area requested by pt/family on  12/04/2012 FL2 transmitted to all facilities within larger geographic area on   Patient informed that his/her managed care company has contracts with or will negotiate with  certain facilities, including the following:     Patient/family informed of bed offers received:  12/04/2012 Patient chooses bed at [facility] Physician recommends and patient chooses bed at  [facility]  Patient to be transferred to  on   Patient to be transferred to facility by   The following physician request were entered in Epic:   Additional Comments: Pt has existing pasarr.  Derenda Fennel, [place] [phone]

## 2012-12-04 NOTE — Progress Notes (Signed)
UR chart review completed.  

## 2012-12-04 NOTE — Care Management Note (Addendum)
    Page 1 of 1   12/05/2012     2:49:25 PM   CARE MANAGEMENT NOTE 12/05/2012  Patient:  IDREES, QUAM   Account Number:  [id number]  Date Initiated:  12/04/2012  Documentation initiated by:  Sharrie Rothman  Subjective/Objective Assessment:   Pt admitted from [facility] with UTI. Pt is dialysis pt in [place]. Family does not want to return to [facility] at discharge.     Action/Plan:   CSW is aware of pts family choice to transfer facilities. Pts wife has decided if unable to place pt else where, they will take pt home with [facility] and DMe. Will continue to follow for discharge planning needs.   Anticipated DC Date:  12/06/2012   Anticipated DC Plan:  SKILLED NURSING FACILITY  In-house referral  Clinical Social Worker      DC Planning Services  CM consult      Choice offered to / List presented to:             Status of service:  Completed, signed off Medicare Important Message given?  YES (If response is "NO", the following Medicare IM given date fields will be blank) Date Medicare IM given:   Date Additional Medicare IM given:    Discharge Disposition:  SKILLED NURSING FACILITY  Per UR Regulation:    If discussed at Long Length of Stay Meetings, dates discussed:    Comments:  12/05/12 1450 Arlyss Queen, RN BSN CM Pt discharged to [facility] today. CSw to arrange discharge to facility.  12/04/12 1100 Arlyss Queen, RN BSN CM

## 2012-12-04 NOTE — Progress Notes (Signed)
ANTIBIOTIC CONSULT NOTE - follow up  Pharmacy Consult for zosyn Indication: UTI  Allergies  Allergen Reactions  . Ace Inhibitors Other (See Comments)    cough  . Rocephin [Ceftriaxone Sodium] Hives  . Beta Adrenergic Blockers Rash   Patient Measurements: Height: 5\' 9"  (175.3 cm) Weight: 232 lb 9.4 oz (105.5 kg) IBW/kg (Calculated) : 70.7  Vital Signs: Temp: 98.2 F (36.8 C) (11/24 0620) Temp src: Axillary (11/24 0620) BP: 80/68 mmHg (11/24 0620) Pulse Rate: 70 (11/24 0620) Intake/Output from previous day: 11/23 0701 - 11/24 0700 In: 2361.7 [P.O.:720; I.V.:1191.7; IV Piggyback:450] Out: -  Intake/Output from this shift:    Labs:  Recent Labs  12/02/12 0608 12/03/12 0559 12/04/12 0517  WBC 1.1* 1.9* 3.9*  HGB 9.2* 8.3* 8.0*  PLT 239 202 215  CREATININE 4.65* 3.10* 4.01*   Estimated Creatinine Clearance: 21.4 ml/min (by C-G formula based on Cr of 4.01). No results found for this basename: VANCOTROUGH, VANCOPEAK, VANCORANDOM, GENTTROUGH, GENTPEAK, GENTRANDOM, TOBRATROUGH, TOBRAPEAK, TOBRARND, AMIKACINPEAK, AMIKACINTROU, AMIKACIN,  in the last 72 hours   Microbiology: Recent Results (from the past 720 hour(s))  URINE CULTURE     Status: None   Collection Time    11/04/12  3:10 PM      Result Value Range Status   Specimen Description URINE, CLEAN CATCH   Final   Special Requests NONE   Final   Culture  Setup Time     Final   Value: 11/04/2012 23:37     Performed at [facility] Count     Final   Value: >=100,000 COLONIES/ML     Performed at [facility]   Culture     Final   Value: PSEUDOMONAS AERUGINOSA     Performed at [facility]   Report Status 11/06/2012 FINAL   Final   Organism ID, Bacteria PSEUDOMONAS AERUGINOSA   Final  URINE CULTURE     Status: None   Collection Time    12/01/12  3:15 PM      Result Value Range Status   Specimen Description URINE, CLEAN CATCH   Final   Special Requests NONE   Final   Culture   Setup Time     Final   Value: 12/02/2012 01:31     Performed at [facility] Count     Final   Value: NO GROWTH     Performed at [facility]   Culture     Final   Value: NO GROWTH     Performed at [facility]   Report Status 12/03/2012 FINAL   Final  CULTURE, BLOOD (ROUTINE X 2)     Status: None   Collection Time    12/01/12  5:10 PM      Result Value Range Status   Specimen Description LEFT ANTECUBITAL   Final   Special Requests BOTTLES DRAWN AEROBIC AND ANAEROBIC  15 CC EAC   Final   Culture NO GROWTH 2 DAYS   Final   Report Status PENDING   Incomplete  CULTURE, BLOOD (ROUTINE X 2)     Status: None   Collection Time    12/01/12  5:13 PM      Result Value Range Status   Specimen Description RIGHT ANTECUBITAL   Final   Special Requests BOTTLES DRAWN AEROBIC AND ANAEROBIC 15 CC EACH   Final   Culture NO GROWTH 2 DAYS   Final   Report Status PENDING  Incomplete  MRSA PCR SCREENING     Status: Abnormal   Collection Time    12/02/12  2:46 AM      Result Value Range Status   MRSA by PCR POSITIVE (*) NEGATIVE Final   Comment:            The GeneXpert MRSA Assay (FDA     approved for NASAL specimens     only), is one component of a     comprehensive MRSA colonization     surveillance program. It is not     intended to diagnose MRSA     infection nor to guide or     monitor treatment for     MRSA infections.     RESULT CALLED TO, READ BACK BY AND VERIFIED WITH:     HANDY,C AT 0700 ON 12/02/12 BY MOSLEYJ  CLOSTRIDIUM DIFFICILE BY PCR     Status: None   Collection Time    12/02/12  4:10 AM      Result Value Range Status   C difficile by pcr NEGATIVE  NEGATIVE Final   Medical History: Past Medical History  Diagnosis Date  . Arthritis   . Hypertension   . DJD (degenerative joint disease)   . DJD (degenerative joint disease), ankle and foot   . Arthritis   . Cellulitis   . Arthritis   . Chronic back pain   . Gout   . Dysrhythmia      afibrillation  . Atrial fibrillation   . Chronic kidney disease   . Colon cancer     2005  . 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 Charcot ankle     dr. Emilio Math  . Bacteremia    Medications:  Prescriptions prior to admission  Medication Sig Dispense Refill  . allopurinol (ZYLOPRIM) 100 MG tablet Take 100 mg by mouth daily.      . Amino Acids-Protein Hydrolys (FEEDING SUPPLEMENT, PRO-STAT SUGAR FREE 64,) LIQD Take 30 mLs by mouth 3 (three) times daily with meals.      . Calcium Carb-Cholecalciferol (CALCIUM 600 + D PO) Take 1 tablet by mouth daily.      . digoxin (LANOXIN) 0.125 MG tablet Take 0.125 mg by mouth every other day.      . diltiazem (TIAZAC) 120 MG 24 hr capsule Take 120 mg by mouth daily.      . famotidine (PEPCID) 20 MG tablet Take 1 tablet (20 mg total) by mouth 2 (two) times daily.      . ferrous fumarate (HEMOCYTE - 106 MG FE) 325 (106 FE) MG TABS tablet Take 1 tablet by mouth 2 (two) times daily.      . Folic Acid 0.8 MG CAPS Take 1 capsule by mouth daily.      Marland Kitchen HYDROcodone-acetaminophen (NORCO/VICODIN) 5-325 MG per tablet Take 1 tablet by mouth every 4 (four) hours as needed for pain.      Marland Kitchen insulin aspart (NOVOLOG) 100 UNIT/ML injection Inject 2-12 Units into the skin 3 (three) times daily with meals. If sugar is 150-200=2 units, 201-250=4 units, 251-300=6 units, 301-350=6 units, 351-400=10 units, 401-500=12 units, if above 400 call md.      . metoCLOPramide (REGLAN) 10 MG tablet Take 10 mg by mouth 3 (three) times daily.      . polyethylene glycol (MIRALAX / GLYCOLAX) packet Take 17 g by mouth daily.      . pregabalin (LYRICA) 75 MG capsule Take 75 mg by mouth 2 (two) times  daily.      . saccharomyces boulardii (FLORASTOR) 250 MG capsule Take 250 mg by mouth 2 (two) times daily.      . vitamin B-12 1000 MCG tablet Take 1 tablet (1,000 mcg total) by mouth daily.      . vitamin C (ASCORBIC ACID) 500 MG tablet Take 500 mg by mouth 2 (two) times daily.         Assessment: Okay for Protocol, allegies noted.  Has tolerated Zosyn during previous admissions.  ESRD on HD.  Goal of Therapy:  Eradicate infection.  Plan:  Zosyn 2.25gm IV every 8 hours. Follow-up micro data, labs, vitals. Follow up culture results  Valrie Hart A 12/04/2012,10:34 AM

## 2012-12-05 DIAGNOSIS — L02419 Cutaneous abscess of limb, unspecified: Secondary | ICD-10-CM

## 2012-12-05 LAB — CBC WITH DIFFERENTIAL/PLATELET
Basophils Absolute: 0.1 10*3/uL (ref 0.0–0.1)
Basophils Relative: 2 % — ABNORMAL HIGH (ref 0–1)
Eosinophils Absolute: 0.2 10*3/uL (ref 0.0–0.7)
Eosinophils Relative: 4 % (ref 0–5)
HCT: 27.1 % — ABNORMAL LOW (ref 39.0–52.0)
MCH: 26.3 pg (ref 26.0–34.0)
MCV: 84.7 fL (ref 78.0–100.0)
Platelets: 212 10*3/uL (ref 150–400)
RDW: 19.9 % — ABNORMAL HIGH (ref 11.5–15.5)

## 2012-12-05 LAB — BASIC METABOLIC PANEL
BUN: 39 mg/dL — ABNORMAL HIGH (ref 6–23)
CO2: 20 mEq/L (ref 19–32)
Calcium: 8.8 mg/dL (ref 8.4–10.5)
Chloride: 98 mEq/L (ref 96–112)
Creatinine, Ser: 4.69 mg/dL — ABNORMAL HIGH (ref 0.50–1.35)
GFR calc Af Amer: 14 mL/min — ABNORMAL LOW (ref >90)
GFR calc non Af Amer: 12 mL/min — ABNORMAL LOW (ref >90)
Glucose, Bld: 93 mg/dL (ref 70–99)
Potassium: 3.2 mEq/L — ABNORMAL LOW (ref 3.5–5.1)
Sodium: 135 mEq/L (ref 135–145)

## 2012-12-05 LAB — GLUCOSE, CAPILLARY: Glucose-Capillary: 120 mg/dL — ABNORMAL HIGH (ref 70–99)

## 2012-12-05 MED ORDER — VANCOMYCIN HCL IN DEXTROSE 1-5 GM/200ML-% IV SOLN: 1000.0000 mg | INTRAVENOUS | Status: AC

## 2012-12-05 MED ORDER — MIDODRINE HCL 10 MG PO TABS: 10.0000 mg | ORAL_TABLET | Freq: Two times a day (BID) | ORAL | Status: AC

## 2012-12-05 MED ORDER — MIDODRINE HCL 5 MG PO TABS
10.0000 mg | ORAL_TABLET | Freq: Two times a day (BID) | ORAL | Status: DC
  Filled 2012-12-05 (×3): qty 2

## 2012-12-05 MED ORDER — VANCOMYCIN HCL IN DEXTROSE 1-5 GM/200ML-% IV SOLN
1000.0000 mg | INTRAVENOUS | Status: DC
  Administered 2012-12-05: 1000 mg via INTRAVENOUS
  Filled 2012-12-05 (×2): qty 200

## 2012-12-05 NOTE — Progress Notes (Addendum)
Alex Petty  MRN: [id number]  DOB/AGE: 1945-11-20 67 y.o.  Primary Care Physician:HALL, [facility], MD  Admit date: 12/01/2012  Chief Complaint:  Chief Complaint  Patient presents with  . Fall    S-Pt presented on  12/01/2012 with  Chief Complaint  Patient presents with  . Fall  .    Pt offers no new complaints.  Meds . allopurinol  100 mg Oral Daily  . Chlorhexidine Gluconate Cloth  6 each Topical Q0600  . digoxin  0.125 mg Oral QODAY  . epoetin (EPOGEN/PROCRIT) injection  10,000 Units Intravenous Q T,Th,Sa-HD  . feeding supplement (PRO-STAT SUGAR FREE 64)  30 mL Oral TID WC  . feeding supplement (RESOURCE BREEZE)  1 Container Oral TID BM  . heparin  5,000 Units Subcutaneous Q8H  . insulin aspart  0-15 Units Subcutaneous TID WC  . insulin aspart  0-5 Units Subcutaneous QHS  . metoCLOPramide (REGLAN) injection  10 mg Intravenous Q6H  . mupirocin ointment  1 application Nasal BID  . pregabalin  75 mg Oral BID  . saccharomyces boulardii  250 mg Oral BID       Physical Exam: Vital signs in last 24 hours: Temp:  [97.6 F (36.4 C)-98.4 F (36.9 C)] 97.6 F (36.4 C) (11/25 0604) Pulse Rate:  [69-74] 74 (11/25 0604) Resp:  [20-21] 20 (11/25 0604) BP: (90-124)/(48-62) 90/49 mmHg (11/25 0604) SpO2:  [93 %-95 %] 95 % (11/25 0604) Weight change:  Last BM Date: 12/04/12  Intake/Output from previous day:       Physical Exam: General- pt is awake,alert, follows commands. Resp- No acute REsp distress, Decreased Bs at bases. CVS- S1S2 regular in rate and rhythm GIT- BS+, soft, NT, ND EXT- Trace LE Edema, NO Cyanosis, Charcot changes + Access- Right PC  In situ  Lab Results: CBC  Recent Labs  12/04/12 0517 12/05/12 0518  WBC 3.9* 5.4  HGB 8.0* 8.4*  HCT 26.3* 27.1*  PLT 215 212    BMET  Recent Labs  12/04/12 0517 12/05/12 0518  NA 136 135  K 3.3* 3.2*  CL 98 98  CO2 22 20  GLUCOSE 96 93  BUN 29* 39*  CREATININE 4.01* 4.69*  CALCIUM 8.8 8.8     MICRO Recent Results (from the past 240 hour(s))  URINE CULTURE     Status: None   Collection Time    12/01/12  3:15 PM      Result Value Range Status   Specimen Description URINE, CLEAN CATCH   Final   Special Requests NONE   Final   Culture  Setup Time     Final   Value: 12/02/2012 01:31     Performed at [facility] Count     Final   Value: NO GROWTH     Performed at [facility]   Culture     Final   Value: NO GROWTH     Performed at [facility]   Report Status 12/03/2012 FINAL   Final  CULTURE, BLOOD (ROUTINE X 2)     Status: None   Collection Time    12/01/12  5:10 PM      Result Value Range Status   Specimen Description LEFT ANTECUBITAL   Final   Special Requests BOTTLES DRAWN AEROBIC AND ANAEROBIC  15 CC EAC   Final   Culture NO GROWTH 3 DAYS   Final   Report Status PENDING   Incomplete  CULTURE, BLOOD (ROUTINE X 2)  Status: None   Collection Time    12/01/12  5:13 PM      Result Value Range Status   Specimen Description RIGHT ANTECUBITAL   Final   Special Requests BOTTLES DRAWN AEROBIC AND ANAEROBIC 15 CC EACH   Final   Culture NO GROWTH 3 DAYS   Final   Report Status PENDING   Incomplete  MRSA PCR SCREENING     Status: Abnormal   Collection Time    12/02/12  2:46 AM      Result Value Range Status   MRSA by PCR POSITIVE (*) NEGATIVE Final   Comment:            The GeneXpert MRSA Assay (FDA     approved for NASAL specimens     only), is one component of a     comprehensive MRSA colonization     surveillance program. It is not     intended to diagnose MRSA     infection nor to guide or     monitor treatment for     MRSA infections.     RESULT CALLED TO, READ BACK BY AND VERIFIED WITH:     HANDY,C AT 0700 ON 12/02/12 BY MOSLEYJ  CLOSTRIDIUM DIFFICILE BY PCR     Status: None   Collection Time    12/02/12  4:10 AM      Result Value Range Status   C difficile by pcr NEGATIVE  NEGATIVE Final      Lab Results   Component Value Date   CALCIUM 8.8 12/05/2012   PHOS 3.2 12/03/2012       Impression: 1)Renal  ESRD on HD On TTS schedule Will dialyze today  2)CVS- Hypotension -stable.  3)Anemia HGb at goal (9--11) On EPO  4)CKD Mineral-Bone Disorder Phosphorus at goal.   5)ID-admitted with sepsis/UTI ON ABX Primary MD following  6)Electrolytes Hypokalemic- Will use 4 k bath  Normonatremic  7)Acid base Co2 at goal  8) Onco Neutropenia CLL May need haem help   Plan:   Will dialyze today. Will use 4 k bath as pt is hypokalemic. On epo w HD .     [name] 12/05/2012, 6:59 AM

## 2012-12-05 NOTE — Clinical Social Work Note (Signed)
Pt d/c today to [facility]. Pt sleeping. CSW left voicemail for pt's wife. Facility aware and agreeable and report wife has already completed paperwork. Pt to transfer with RN. D/C summary faxed.  Derenda Fennel, [place] [phone]

## 2012-12-05 NOTE — Clinical Social Work Placement (Signed)
Clinical Social Work Department CLINICAL SOCIAL WORK PLACEMENT NOTE 12/05/2012  Patient:  Alex Petty, Alex Petty  Account Number:  [id number] Admit date:  12/01/2012  Clinical Social Worker:  Derenda Fennel, LCSW  Date/time:  12/04/2012 10:40 AM  Clinical Social Work is seeking post-discharge placement for this patient at the following level of care:   SKILLED NURSING   (*CSW will update this form in Epic as items are completed)   12/04/2012  Patient/family provided with [facility] Department of Clinical Social Work's list of facilities offering this level of care within the geographic area requested by the patient (or if unable, by the patient's family).  12/04/2012  Patient/family informed of their freedom to choose among providers that offer the needed level of care, that participate in Medicare, Medicaid or managed care program needed by the patient, have an available bed and are willing to accept the patient.  12/04/2012  Patient/family informed of [facility]' ownership interest in [facility], as well as of the fact that they are under no obligation to receive care at this facility.  PASARR submitted to EDS on  PASARR number received from EDS on   FL2 transmitted to all facilities in geographic area requested by pt/family on  12/04/2012 FL2 transmitted to all facilities within larger geographic area on   Patient informed that his/her managed care company has contracts with or will negotiate with  certain facilities, including the following:     Patient/family informed of bed offers received:  12/04/2012 Patient chooses bed at [facility] Physician recommends and patient chooses bed at  [facility]  Patient to be transferred to [facility] on  12/05/2012 Patient to be transferred to facility by RN  The following physician request were entered in Epic:   Additional Comments: Pt has existing pasarr.  Derenda Fennel, [place] [phone]

## 2012-12-05 NOTE — Discharge Summary (Signed)
Physician Discharge Summary  Alex Petty ZOX:[id number] DOB: 1945-09-25 DOA: 12/01/2012  PCP: [name], MD  Admit date: 12/01/2012 Discharge date: 12/05/2012  Time spent: 45 minutes  Recommendations for Outpatient Follow-up:  1. Patient will be discharged to [facility] 2. Continue vancomycin with dialysis for one more week 3. Follow up with oncology in the next 1 month if continues to improve 4. Continue dialysis Ulanda Edison, Sat   Discharge Diagnoses:  Active Problems:   A-fib   CLL (chronic lymphocytic leukemia)   ESRD on dialysis   UTI (urinary tract infection)   Sepsis   Metabolic encephalopathy   Neutropenia   Fall   Discharge Condition: improved  Diet recommendation: low salt  Filed Weights   12/02/12 1130 12/02/12 1630 12/05/12 1000  Weight: 106.1 kg (233 lb 14.5 oz) 105.5 kg (232 lb 9.4 oz) 111.1 kg (244 lb 14.9 oz)    History of present illness:  Alex Petty is a 67 y.o. male with multiple medical problems who has had multiple prolonged hospitalizations since 7/14. Patient is currently residing at [facility] to undergo physical therapy. His family reports that he was in his usual state of health yesterday. Overnight, the patient became increasingly confused and had a fall overnight. This morning, he was noted to be increasingly lethargic. His family feels that this is consistent with prior urinary tract infections. He is brought to the emergency room where it was noted that his blood pressure was in the lower side. He was noted to be significantly leukopenic/neutropenic. Urinalysis confirmed a possible urinary tract infection. Patient will be admitted for further treatments. His family does not report any recent shortness of breath, cough, vomiting, diarrhea.   Hospital Course:  This patient was admitted to the hospital with a worsening encephalopathy, hypotension and leukopenia/neutropenia. Initially, his symptoms were felt to be due to a urinary  tract infection. He was empirically started on Zosyn and admitted for further monitoring. Blood cultures and urine culture showed no significant growth. Stool for C. difficile was also found to be negative. On further examination, it was noted that his sacral area has a diffuse cellulitis. No open draining wounds were noted. Antibiotics were changed from Zosyn to vancomycin. The patient's clinical condition continued to improve and his encephalopathy has started to resolve. His leukopenia/neutropenia has also resolved. It appears that he is now approaching his baseline. He will continue on vancomycin with dialysis for the next 7 days. He has had some hypotension and his Cardizem has been held. I have discussed with nephrology and started him on twice a day midodrine.  Patient is ready for discharge to a skilled nursing facility.  Procedures:  none  Consultations:  nephrology  Discharge Exam: Filed Vitals:   12/05/12 1424  BP: 104/60  Pulse: 70  Temp: 97.8 F (36.6 C)  Resp: 22    General: NAD Cardiovascular: S1, S2 irregular Respiratory: CTA B  Discharge Instructions  Discharge Orders   Future Orders Complete By Expires   Call MD for:  extreme fatigue  As directed    Call MD for:  persistant dizziness or light-headedness  As directed    Call MD for:  temperature >100.4  As directed    Diet - low sodium heart healthy  As directed    Increase activity slowly  As directed        Medication List    STOP taking these medications       diltiazem 120 MG 24 hr capsule  Commonly known as:  TIAZAC     HYDROcodone-acetaminophen 5-325 MG per tablet  Commonly known as:  NORCO/VICODIN     polyethylene glycol packet  Commonly known as:  MIRALAX / GLYCOLAX      TAKE these medications       allopurinol 100 MG tablet  Commonly known as:  ZYLOPRIM  Take 100 mg by mouth daily.     CALCIUM 600 + D PO  Take 1 tablet by mouth daily.     cyanocobalamin 1000 MCG tablet  Take 1  tablet (1,000 mcg total) by mouth daily.     digoxin 0.125 MG tablet  Commonly known as:  LANOXIN  Take 0.125 mg by mouth every other day.     famotidine 20 MG tablet  Commonly known as:  PEPCID  Take 1 tablet (20 mg total) by mouth 2 (two) times daily.     feeding supplement (PRO-STAT SUGAR FREE 64) Liqd  Take 30 mLs by mouth 3 (three) times daily with meals.     ferrous fumarate 325 (106 FE) MG Tabs tablet  Commonly known as:  HEMOCYTE - 106 mg FE  Take 1 tablet by mouth 2 (two) times daily.     Folic Acid 0.8 MG Caps  Take 1 capsule by mouth daily.     insulin aspart 100 UNIT/ML injection  Commonly known as:  novoLOG  Inject 2-12 Units into the skin 3 (three) times daily with meals. If sugar is 150-200=2 units, 201-250=4 units, 251-300=6 units, 301-350=6 units, 351-400=10 units, 401-500=12 units, if above 400 call md.     metoCLOPramide 10 MG tablet  Commonly known as:  REGLAN  Take 10 mg by mouth 3 (three) times daily.     midodrine 10 MG tablet  Commonly known as:  PROAMATINE  Take 1 tablet (10 mg total) by mouth 2 (two) times daily with a meal.     pregabalin 75 MG capsule  Commonly known as:  LYRICA  Take 75 mg by mouth 2 (two) times daily.     saccharomyces boulardii 250 MG capsule  Commonly known as:  FLORASTOR  Take 250 mg by mouth 2 (two) times daily.     vancomycin 1 GM/200ML Soln  Commonly known as:  VANCOCIN  Inject 200 mLs (1,000 mg total) into the vein Every Tuesday,Thursday,and Saturday with dialysis. For 7 more days     vitamin C 500 MG tablet  Commonly known as:  ASCORBIC ACID  Take 500 mg by mouth 2 (two) times daily.       Allergies  Allergen Reactions  . Ace Inhibitors Other (See Comments)    cough  . Rocephin [Ceftriaxone Sodium] Hives  . Beta Adrenergic Blockers Rash      The results of significant diagnostics from this hospitalization (including imaging, microbiology, ancillary and laboratory) are listed below for reference.     Significant Diagnostic Studies: Dg Chest 1 View  12/01/2012   CLINICAL DATA:  Fall, hypertension, atrial fibrillation  EXAM: CHEST - 1 VIEW  COMPARISON:  11/04/2012  FINDINGS: Stable severe cardiac enlargement. Vascular pattern normal. No infiltrate consolidation effusion or pneumothorax. Dual lumen central line on the right is stable. Bony thorax grossly intact.  IMPRESSION: No active disease.   Electronically Signed   By: Esperanza Heir M.D.   On: 12/01/2012 14:59   Dg Lumbar Spine Complete  12/01/2012   CLINICAL DATA:  Fall, back pain  EXAM: LUMBAR SPINE - COMPLETE 4+ VIEW  COMPARISON:  None.  FINDINGS:  Mild convex left scoliosis. No fracture. L3-4, L4-5, and L5-S1 show significant degenerative facet change. There is bilateral sacroiliac degenerative joint disease. There is severe L3-4 degenerative disc disease. There is moderate L4-5 and moderate to severe L5-S1 degenerative disc disease. There is no fracture.  IMPRESSION: Severe degenerative change.  No acute findings.   Electronically Signed   By: Esperanza Heir M.D.   On: 12/01/2012 15:00   Ct Head Wo Contrast  12/01/2012   CLINICAL DATA:  Larey Seat out of bed last evening.  EXAM: CT HEAD WITHOUT CONTRAST  TECHNIQUE: Contiguous axial images were obtained from the base of the skull through the vertex without intravenous contrast.  COMPARISON:  None  FINDINGS: Ventricles are normal in configuration. There is ventricular and sulcal enlargement reflecting moderate atrophy. No parenchymal masses or mass effect. No evidence of a cortical infarct. There are no extra-axial masses or abnormal fluid collections.  No intracranial hemorrhage.  No skull fracture. Mucous retention cyst in the left maxillary sinus.  IMPRESSION: No acute intracranial abnormalities.  Moderate atrophy.   Electronically Signed   By: Amie Portland M.D.   On: 12/01/2012 14:42    Microbiology: Recent Results (from the past 240 hour(s))  URINE CULTURE     Status: None   Collection  Time    12/01/12  3:15 PM      Result Value Range Status   Specimen Description URINE, CLEAN CATCH   Final   Special Requests NONE   Final   Culture  Setup Time     Final   Value: 12/02/2012 01:31     Performed at [facility] Count     Final   Value: NO GROWTH     Performed at [facility]   Culture     Final   Value: NO GROWTH     Performed at [facility]   Report Status 12/03/2012 FINAL   Final  CULTURE, BLOOD (ROUTINE X 2)     Status: None   Collection Time    12/01/12  5:10 PM      Result Value Range Status   Specimen Description BLOOD LEFT ANTECUBITAL   Final   Special Requests BOTTLES DRAWN AEROBIC AND ANAEROBIC 15CC   Final   Culture NO GROWTH 4 DAYS   Final   Report Status PENDING   Incomplete  CULTURE, BLOOD (ROUTINE X 2)     Status: None   Collection Time    12/01/12  5:13 PM      Result Value Range Status   Specimen Description BLOOD RIGHT ANTECUBITAL   Final   Special Requests BOTTLES DRAWN AEROBIC AND ANAEROBIC 15CC   Final   Culture NO GROWTH 4 DAYS   Final   Report Status PENDING   Incomplete  MRSA PCR SCREENING     Status: Abnormal   Collection Time    12/02/12  2:46 AM      Result Value Range Status   MRSA by PCR POSITIVE (*) NEGATIVE Final   Comment:            The GeneXpert MRSA Assay (FDA     approved for NASAL specimens     only), is one component of a     comprehensive MRSA colonization     surveillance program. It is not     intended to diagnose MRSA     infection nor to guide or     monitor treatment for  MRSA infections.     RESULT CALLED TO, READ BACK BY AND VERIFIED WITH:     HANDY,C AT 0700 ON 12/02/12 BY MOSLEYJ  CLOSTRIDIUM DIFFICILE BY PCR     Status: None   Collection Time    12/02/12  4:10 AM      Result Value Range Status   C difficile by pcr NEGATIVE  NEGATIVE Final     Labs: Basic Metabolic Panel:  Recent Labs Lab 12/01/12 1343 12/02/12 0608 12/03/12 0559 12/04/12 0517  12/05/12 0518  NA 139 137 135 136 135  K 3.4* 3.3* 3.0* 3.3* 3.2*  CL 98 96 95* 98 98  CO2 25 22 27 22 20   GLUCOSE 105* 124* 95 96 93  BUN 21 29* 17 29* 39*  CREATININE 3.78* 4.65* 3.10* 4.01* 4.69*  CALCIUM 10.2 10.3 8.9 8.8 8.8  PHOS  --   --  3.2  --   --    Liver Function Tests:  Recent Labs Lab 12/01/12 1343  AST 18  ALT 12  ALKPHOS 83  BILITOT 0.5  PROT 6.2  ALBUMIN 2.8*    Recent Labs Lab 12/01/12 1343  LIPASE 23   No results found for this basename: AMMONIA,  in the last 168 hours CBC:  Recent Labs Lab 12/01/12 1343 12/02/12 0608 12/03/12 0559 12/04/12 0517 12/05/12 0518  WBC 1.3* 1.1* 1.9* 3.9* 5.4  NEUTROABS 0.1*  --   --  1.6* 3.1  HGB 9.6* 9.2* 8.3* 8.0* 8.4*  HCT 31.5* 30.7* 27.9* 26.3* 27.1*  MCV 86.3 86.7 87.2 85.7 84.7  PLT 238 239 202 215 212   Cardiac Enzymes:  Recent Labs Lab 12/01/12 1343  TROPONINI <0.30   BNP: BNP (last 3 results)  Recent Labs  10/23/12 0400  PROBNP 3985.0*   CBG:  Recent Labs Lab 12/04/12 0744 12/04/12 1208 12/04/12 1646 12/04/12 2248 12/05/12 0734  GLUCAP 94 103* 119* 120* 94       Signed:  Paige Vanderwoude  [facility] 12/05/2012, 2:29 PM

## 2012-12-05 NOTE — Procedures (Signed)
   HEMODIALYSIS TREATMENT NOTE:  3.5 hour low heparin dialysis completed via right IJ tunneled catheter.  Exit site unremarkable.  Goal NOT met:  BP unable to tolerate removal of 2 liters as ordered.  1068cc removed; ultrafiltration was interrupted for 1h 40 minutes due to SBP<90.  All blood was reinfused.  Report given to Jackquline Bosch, RN.  Tarrence Enck L. Jaretzi Droz, RN, CDN

## 2012-12-05 NOTE — Progress Notes (Signed)
ANTIBIOTIC CONSULT NOTE  Pharmacy Consult for Vancomycin Indication: Cellulitis   Allergies  Allergen Reactions  . Ace Inhibitors Other (See Comments)    cough  . Rocephin [Ceftriaxone Sodium] Hives  . Beta Adrenergic Blockers Rash    Patient Measurements: Height: 5\' 9"  (175.3 cm) Weight: 232 lb 9.4 oz (105.5 kg) IBW/kg (Calculated) : 70.7  Vital Signs: Temp: 97.6 F (36.4 C) (11/25 0604) Temp src: Oral (11/25 0604) BP: 90/49 mmHg (11/25 0604) Pulse Rate: 74 (11/25 0604) Intake/Output from previous day:   Intake/Output from this shift:    Labs:  Recent Labs  12/03/12 0559 12/04/12 0517 12/05/12 0518  WBC 1.9* 3.9* 5.4  HGB 8.3* 8.0* 8.4*  PLT 202 215 212  CREATININE 3.10* 4.01* 4.69*   Estimated Creatinine Clearance: 18.3 ml/min (by C-G formula based on Cr of 4.69). No results found for this basename: VANCOTROUGH, Leodis Binet, VANCORANDOM, GENTTROUGH, GENTPEAK, GENTRANDOM, TOBRATROUGH, TOBRAPEAK, TOBRARND, AMIKACINPEAK, AMIKACINTROU, AMIKACIN,  in the last 72 hours   Microbiology: Recent Results (from the past 720 hour(s))  URINE CULTURE     Status: None   Collection Time    12/01/12  3:15 PM      Result Value Range Status   Specimen Description URINE, CLEAN CATCH   Final   Special Requests NONE   Final   Culture  Setup Time     Final   Value: 12/02/2012 01:31     Performed at [facility] Count     Final   Value: NO GROWTH     Performed at [facility]   Culture     Final   Value: NO GROWTH     Performed at [facility]   Report Status 12/03/2012 FINAL   Final  CULTURE, BLOOD (ROUTINE X 2)     Status: None   Collection Time    12/01/12  5:10 PM      Result Value Range Status   Specimen Description LEFT ANTECUBITAL   Final   Special Requests BOTTLES DRAWN AEROBIC AND ANAEROBIC  15 CC EAC   Final   Culture NO GROWTH 3 DAYS   Final   Report Status PENDING   Incomplete  CULTURE, BLOOD (ROUTINE X 2)     Status: None   Collection Time    12/01/12  5:13 PM      Result Value Range Status   Specimen Description RIGHT ANTECUBITAL   Final   Special Requests BOTTLES DRAWN AEROBIC AND ANAEROBIC 15 CC EACH   Final   Culture NO GROWTH 3 DAYS   Final   Report Status PENDING   Incomplete  MRSA PCR SCREENING     Status: Abnormal   Collection Time    12/02/12  2:46 AM      Result Value Range Status   MRSA by PCR POSITIVE (*) NEGATIVE Final   Comment:            The GeneXpert MRSA Assay (FDA     approved for NASAL specimens     only), is one component of a     comprehensive MRSA colonization     surveillance program. It is not     intended to diagnose MRSA     infection nor to guide or     monitor treatment for     MRSA infections.     RESULT CALLED TO, READ BACK BY AND VERIFIED WITH:     HANDY,C AT 0700 ON 12/02/12 BY MOSLEYJ  CLOSTRIDIUM DIFFICILE BY PCR     Status: None   Collection Time    12/02/12  4:10 AM      Result Value Range Status   C difficile by pcr NEGATIVE  NEGATIVE Final    Medical History: Past Medical History  Diagnosis Date  . Arthritis   . Hypertension   . DJD (degenerative joint disease)   . DJD (degenerative joint disease), ankle and foot   . Arthritis   . Cellulitis   . Arthritis   . Chronic back pain   . Gout   . Dysrhythmia     afibrillation  . Atrial fibrillation   . Chronic kidney disease   . Colon cancer     2005  . 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 Charcot ankle     dr. Emilio Math  . Bacteremia     Medications:  Scheduled:  . allopurinol  100 mg Oral Daily  . Chlorhexidine Gluconate Cloth  6 each Topical Q0600  . digoxin  0.125 mg Oral QODAY  . epoetin (EPOGEN/PROCRIT) injection  10,000 Units Intravenous Q T,Th,Sa-HD  . feeding supplement (PRO-STAT SUGAR FREE 64)  30 mL Oral TID WC  . feeding supplement (RESOURCE BREEZE)  1 Container Oral TID BM  . heparin  5,000 Units Subcutaneous Q8H  . insulin aspart  0-15 Units Subcutaneous TID WC  .  insulin aspart  0-5 Units Subcutaneous QHS  . metoCLOPramide (REGLAN) injection  10 mg Intravenous Q6H  . midodrine  10 mg Oral BID WC  . mupirocin ointment  1 application Nasal BID  . pregabalin  75 mg Oral BID  . saccharomyces boulardii  250 mg Oral BID  . vancomycin  1,000 mg Intravenous Q T,Th,Sa-HD   Assessment: 67 yo M with ESRD on HD with cellulitis of sacral area.  All cx data has been negative to date.    Vancomycin 11/24>>  Goal of Therapy:  Vancomycin pre-HD level 15-25 mcg/ml  Plan:  Vancomycin 1000 mg IV with each dialysis session Check pre-HD Vancomycin level at steady state Monitor patient progress and cx data   Elson Clan 12/05/2012,8:57 AM

## 2012-12-05 NOTE — Discharge Planning (Signed)
Pt and family stated that he was ready to be [facility] and was in no pain.  Report was given to [facility] Liliane Shi, LPN). IV, foley and tele were removed per orders and pt will be wheeled to [facility] center through tunnel by PCT and family when ready.

## 2012-12-06 ENCOUNTER — Other Ambulatory Visit: Payer: Self-pay

## 2012-12-06 ENCOUNTER — Inpatient Hospital Stay
Admission: RE | Admit: 2012-12-06 | Discharge: 2013-01-03 | Disposition: A | Discharge status: Home / Self Care | Payer: Medicare Other | Source: Ambulatory Visit | Attending: Internal Medicine | Admitting: Internal Medicine

## 2012-12-06 DIAGNOSIS — R1310 Dysphagia, unspecified: Principal | ICD-10-CM

## 2012-12-06 LAB — CULTURE, BLOOD (ROUTINE X 2)
Culture: NO GROWTH
Culture: NO GROWTH

## 2012-12-06 MED ORDER — PREGABALIN 75 MG PO CAPS: 75.0000 mg | ORAL_CAPSULE | Freq: Two times a day (BID) | ORAL | Status: AC

## 2012-12-07 LAB — GLUCOSE, CAPILLARY: Glucose-Capillary: 129 mg/dL — ABNORMAL HIGH (ref 70–99)

## 2012-12-08 LAB — GLUCOSE, CAPILLARY
Glucose-Capillary: 101 mg/dL — ABNORMAL HIGH (ref 70–99)
Glucose-Capillary: 120 mg/dL — ABNORMAL HIGH (ref 70–99)
Glucose-Capillary: 116 mg/dL — ABNORMAL HIGH (ref 70–99)

## 2012-12-09 LAB — GLUCOSE, CAPILLARY
Glucose-Capillary: 100 mg/dL — ABNORMAL HIGH (ref 70–99)
Glucose-Capillary: 169 mg/dL — ABNORMAL HIGH (ref 70–99)

## 2012-12-10 LAB — GLUCOSE, CAPILLARY
Glucose-Capillary: 86 mg/dL (ref 70–99)
Glucose-Capillary: 99 mg/dL (ref 70–99)
Glucose-Capillary: 94 mg/dL (ref 70–99)
Glucose-Capillary: 117 mg/dL — ABNORMAL HIGH (ref 70–99)

## 2012-12-11 ENCOUNTER — Non-Acute Institutional Stay (SKILLED_NURSING_FACILITY): Payer: Medicare Other | Admitting: Internal Medicine

## 2012-12-11 DIAGNOSIS — C9111 Chronic lymphocytic leukemia of B-cell type in remission: Secondary | ICD-10-CM

## 2012-12-11 DIAGNOSIS — L0231 Cutaneous abscess of buttock: Secondary | ICD-10-CM

## 2012-12-11 DIAGNOSIS — G609 Hereditary and idiopathic neuropathy, unspecified: Secondary | ICD-10-CM

## 2012-12-11 DIAGNOSIS — N185 Chronic kidney disease, stage 5: Secondary | ICD-10-CM

## 2012-12-11 DIAGNOSIS — G629 Polyneuropathy, unspecified: Secondary | ICD-10-CM

## 2012-12-11 LAB — GLUCOSE, CAPILLARY
Glucose-Capillary: 108 mg/dL — ABNORMAL HIGH (ref 70–99)
Glucose-Capillary: 98 mg/dL (ref 70–99)

## 2012-12-11 NOTE — Progress Notes (Signed)
Patient ID: Alex Petty, male   DOB: [birth year], 67 y.o.   MRN: [id number]  Facility; [facility] Chief complaint; admission to SNF post admit to [facility]  from November 21 to November 25  History; this is a medically complex man who apparently has been in the hospital since July 2014. Before this most recent admission he was set up on to a nursing home per he was admitted to the hospital this occasion becoming increasingly confused and had a fall a. He was lethargic. He was brought to the emergency room and his blood pressure was low. The patient was admitted to hospital with delirium that would initially was felt to be due to a UTI. He was started on Zosyn blood and urine cultures were negative. Stool for C. difficile was negative for. It was noted that his sacral area had diffuse cellulitis with no wounds to. Antibiotics were changed from Zosyn to vancomycin and the patient continued to improve. His leukopenia and neutropenia also improved the. He was discharged on vancomycin with dialysis for the next 7 days. He also had some hypotension. His Cardizem was held and in conjunction with nephrology he was started on Midodrine.   [facility] tells me the patient has been in facility since July. He apparently was started on dialysis in roughly that time frame. 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 He had a mass in the left supraclavicular area which responded to chemotherapy. At this point I really have not had the time to look through these records. His diagnosis is listed as chronic lymphocytic leukemia  His wife tells me that before 2 months ago he was able to stand and walk. He is now a Physiological scientist.  Past Medical History  Diagnosis Date  . Arthritis   . Hypertension   . DJD (degenerative joint disease)   . DJD (degenerative joint disease), ankle and foot   . Arthritis   .  Cellulitis   . Arthritis   . Chronic back pain   . Gout   . Dysrhythmia     afibrillation  . Atrial fibrillation   . Chronic kidney disease   . Colon cancer     2005  . 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 Charcot ankle     dr. Emilio Math  . Bacteremia    Past Surgical History  Procedure Laterality Date  . Foot surgery      right foot-  . Colon surgery      forcolon cancer, 2005, Dr. Marcell Anger in [place]  . Tonsillectomy    . Cystoscopy with urethral dilatation    . Back surgery      x2  . Cataract extraction w/phaco  01/14/2011    Procedure: CATARACT EXTRACTION PHACO AND INTRAOCULAR LENS PLACEMENT (IOC);  Surgeon: Gemma Payor;  Location: [facility];  Service: Ophthalmology;  Laterality: Left;  CDE=16.27  . Lymph node biopsy Right 07/11/2012    Procedure: CERVICAL LYMPH NODE BIOPSY;  Surgeon: Marlane Hatcher, MD;  Location: [facility];  Service: General;  Laterality: Right;  end @ 1101  . Portacath placement Right 07/11/2012    Procedure: INSERTION PORT-A-CATH;  Surgeon: Marlane Hatcher, MD;  Location: [facility];  Service: General;  Laterality: Right;  . Port-a-cath removal Right 08/05/2012    Procedure: REMOVAL PORT-A-CATH;  Surgeon: Shelly Rubenstein, MD;  Location: [facility];  Service: General;  Laterality: Right;  Medications; Lopurin all 100 daily, Os-Cal 600+ D. one tablet daily, vitamin B12 thousand daily, digoxin 0.125 every other day, Pepcid 20 twice a day, ferrous fumarate 325 2 tablets daily, folic acid 0.8 daily, NovoLog sliding scale, Reglan 10 mg 3 times a day, Midodrine 10mg bid, BRCA 75 twice a day  Social history; the patient lives between here in [place] in his own home with his wife. At some point in the last 2-3 months he was able to stand and walk although he is clearly not able to do that now. He is aware list transferred here  reports that he has never smoked. He has never used smokeless tobacco. He reports that he does not drink alcohol or use illicit  drugs.  family history includes Breast cancer in his mother; Heart disease in his mother. There is no history of Anesthesia problems, Hypotension, Malignant hyperthermia, or Pseudochol deficiency.  Review of systems Respiratory; no shortness of breath Cardiac no chest pain GI no abdominal pain GU he still voids. Currently he is not tolerating dialysis well according to his wife "they're only able to get 1 L off instead of 2 "  Physical exam; was not able to do a complete exam today as the patient was pressed for time on his way to dialysis GEN patient is lying flat in bed in no distress Respiratory clear entry bilaterally Cardiac 2/6 systolic ejection murmur appears to be euvolemic Abdomen liver edge palpable at the right costal margin there is no spleen Lymph; none palpable in the cervical clavicular or axillary areas. There was no obvious mass in the left shoulder area but his wife describes Extremities; chronic venous stasis. Neurologic; the patient does not have antigravity strength in his lower extremities reflexes are absent. He has Charcot feet bilaterally however he does not have diabetes. All of this points to some form of chronic peripheral neuropathy.  Musculoskeletal active gout in several of the joints of his right hand and right wrist  Impression/plan #1 chronic renal failure with dialysis starting 2 months ago at the time of the acute illness. Not had the chance to look overall this. Apparently the patient was septic at the time. 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 All need to look over the records here. He apparently received some chemotherapy and a left supraclavicular mass apparently resolved. He started his status of this is not clear #3 peripheral neuropathy probably chronic however he is not  a diabetic. #4 cellulitis of the sacrum I did not look at this today he'll try to do this in 48 hours on the back of the building #5 acute on chronic gout in the right hand use NSAIDs on this #6 extreme lower extremity weakness probably secondary to disuse and a underlying peripheral neuropathy. He is not able to stand. His wife states that he would need to be able to do a pivot transfer at home I'm not sure if this is realistic or not. She seems to be prepared for the possibility of a Hoyer lift transfer however per #7 metabolic encephalopathy this appears to have resolved the patient appears to be cognitively intact

## 2012-12-12 LAB — GLUCOSE, CAPILLARY
Glucose-Capillary: 86 mg/dL (ref 70–99)
Glucose-Capillary: 89 mg/dL (ref 70–99)
Glucose-Capillary: 128 mg/dL — ABNORMAL HIGH (ref 70–99)

## 2012-12-13 LAB — GLUCOSE, CAPILLARY: Glucose-Capillary: 109 mg/dL — ABNORMAL HIGH (ref 70–99)

## 2012-12-14 LAB — GLUCOSE, CAPILLARY
Glucose-Capillary: 92 mg/dL (ref 70–99)
Glucose-Capillary: 149 mg/dL — ABNORMAL HIGH (ref 70–99)

## 2012-12-15 ENCOUNTER — Non-Acute Institutional Stay (SKILLED_NURSING_FACILITY): Payer: Medicare Other | Admitting: Internal Medicine

## 2012-12-15 DIAGNOSIS — D649 Anemia, unspecified: Secondary | ICD-10-CM

## 2012-12-15 LAB — GLUCOSE, CAPILLARY: Glucose-Capillary: 99 mg/dL (ref 70–99)

## 2012-12-15 NOTE — Progress Notes (Signed)
Patient ID: Alex Petty, male   DOB: 06-15-1945, 67 y.o.   MRN: [id number] This is an acute visit.  Level of care skilled.  Facility [facility].  Chief complaint-acute visit secondary to anemia.  History of present illness.  Patient is a pleasant 67 year old male with a complex medical history including a recent extensive hospitalization--with history of suspected CLL-sacral cellulitis-and renal disease on dialysis.  He had a recent hemoglobin done which came back at 7.6-I did review his previous hemoglobins and appears his hemoglobin runs largely low 9's To 8--- I do see 7.7 on October 27  Per chart review he does have a history of pancytopenia thought secondary to chemotherapy which he has received.  He does not complaining of any increased shortness of breath palpitations or syncopal type episodes-although he says he feels quite weak after dialysis which he received earlier today.  Family medical social history has been reviewed per admission note by Dr. Leanord Hawking on 12/11/2012.  Medications have been reviewed.  Review of systems.  In general denies fever chills says he feels weak after dialysis.  Respiratory does not complaining shortness breath or cough.  Cardiac does not complaining of chest pain or palpitations.  Neurologic no complaints of dizziness syncopal-type feelings or headache.  Physical exam.  Pulse is 63 respirations 21 blood pressure variable 135/62-90/52.  In general this is a somewhat frail elderly male in no distress resting comfortably in bed.  His skin is warm and dry.-Shunt site right upper chest appears unremarkable  Chest he has a small amount of expiratory wheezing at bases there is no labored breathing  Heart-is regular rate and rhythm with occasional irregular beats    .    Labs.  12/13/2012.  WBC 7.0 hemoglobin 7.6 platelets 189.  November 25 hemoglobin was 8.4.  November 24 hemoglobin was 8.0.  Assessment and plan.   #1-anemia-this appears to be somewhat lower than his baseline--have discussed with nursing staff to notify dialysis of this today although it is somewhat unclear whether dialysis received the message-I did discuss this with Dr. Leanord Hawking at this point continue to monitor-he is on iron twice a day as well as B12 and folic acid-at this point is not clear whether he is receiving supplementation i.e. Epogen at dialysis I suspect he may be--- dialysis may well be increasing this but would like some clarification --will write order to recontact dialysis tomorrow a.m. so they can address this -- clinically he appears stable .  [id number] note  20 minutes spent assessing patient   Reviewing medical records-- labs   and coordinating plan of care Continue to monitor with vital signs which appear to be relatively stable

## 2012-12-16 LAB — GLUCOSE, CAPILLARY
Glucose-Capillary: 96 mg/dL (ref 70–99)
Glucose-Capillary: 110 mg/dL — ABNORMAL HIGH (ref 70–99)

## 2012-12-17 LAB — GLUCOSE, CAPILLARY
Glucose-Capillary: 114 mg/dL — ABNORMAL HIGH (ref 70–99)
Glucose-Capillary: 87 mg/dL (ref 70–99)
Glucose-Capillary: 111 mg/dL — ABNORMAL HIGH (ref 70–99)
Glucose-Capillary: 103 mg/dL — ABNORMAL HIGH (ref 70–99)

## 2012-12-18 LAB — GLUCOSE, CAPILLARY
Glucose-Capillary: 93 mg/dL (ref 70–99)
Glucose-Capillary: 89 mg/dL (ref 70–99)
Glucose-Capillary: 101 mg/dL — ABNORMAL HIGH (ref 70–99)
Glucose-Capillary: 146 mg/dL — ABNORMAL HIGH (ref 70–99)

## 2012-12-19 LAB — GLUCOSE, CAPILLARY: Glucose-Capillary: 107 mg/dL — ABNORMAL HIGH (ref 70–99)

## 2012-12-20 LAB — GLUCOSE, CAPILLARY
Glucose-Capillary: 87 mg/dL (ref 70–99)
Glucose-Capillary: 87 mg/dL (ref 70–99)
Glucose-Capillary: 185 mg/dL — ABNORMAL HIGH (ref 70–99)

## 2012-12-21 LAB — GLUCOSE, CAPILLARY: Glucose-Capillary: 113 mg/dL — ABNORMAL HIGH (ref 70–99)

## 2012-12-24 LAB — GLUCOSE, CAPILLARY: Glucose-Capillary: 245 mg/dL — ABNORMAL HIGH (ref 70–99)

## 2012-12-25 ENCOUNTER — Other Ambulatory Visit (HOSPITAL_COMMUNITY): Payer: Medicare Other

## 2012-12-25 ENCOUNTER — Inpatient Hospital Stay (HOSPITAL_COMMUNITY): Admit: 2012-12-25 | Payer: Medicare Other | Admitting: Speech Pathology

## 2012-12-25 LAB — GLUCOSE, CAPILLARY: Glucose-Capillary: 103 mg/dL — ABNORMAL HIGH (ref 70–99)

## 2012-12-26 LAB — GLUCOSE, CAPILLARY
Glucose-Capillary: 106 mg/dL — ABNORMAL HIGH (ref 70–99)
Glucose-Capillary: 129 mg/dL — ABNORMAL HIGH (ref 70–99)
Glucose-Capillary: 126 mg/dL — ABNORMAL HIGH (ref 70–99)
Glucose-Capillary: 121 mg/dL — ABNORMAL HIGH (ref 70–99)
Glucose-Capillary: 117 mg/dL — ABNORMAL HIGH (ref 70–99)
Glucose-Capillary: 117 mg/dL — ABNORMAL HIGH (ref 70–99)
Glucose-Capillary: 96 mg/dL (ref 70–99)
Glucose-Capillary: 103 mg/dL — ABNORMAL HIGH (ref 70–99)
Glucose-Capillary: 110 mg/dL — ABNORMAL HIGH (ref 70–99)

## 2012-12-27 ENCOUNTER — Non-Acute Institutional Stay (SKILLED_NURSING_FACILITY): Payer: Medicare Other | Admitting: Internal Medicine

## 2012-12-27 DIAGNOSIS — G629 Polyneuropathy, unspecified: Secondary | ICD-10-CM

## 2012-12-27 DIAGNOSIS — N185 Chronic kidney disease, stage 5: Secondary | ICD-10-CM

## 2012-12-27 DIAGNOSIS — G609 Hereditary and idiopathic neuropathy, unspecified: Secondary | ICD-10-CM

## 2012-12-27 NOTE — Progress Notes (Signed)
Patient ID: Alex Petty, male   DOB: 07/19/1945, 67 y.o.   MRN: [id number]   Facility; [facility] Chief complaint; Original admit to SNF post admit to [facility]  from November 21 to November 25. Now reviewing in preparation for discharge.   History; this is a medically complex man who apparently has been in the hospital since July 2014. Before this most recent admission he was set up on to a nursing home per he was admitted to the hospital this occasion becoming increasingly confused and had a fall a. He was lethargic. He was brought to the emergency room and his blood pressure was low. The patient was admitted to hospital with delirium that would initially was felt to be due to a UTI. He was started on Zosyn blood and urine cultures were negative. Stool for C. difficile was negative for. It was noted that his sacral area had diffuse cellulitis with no wounds to. Antibiotics were changed from Zosyn to vancomycin and the patient continued to improve. His leukopenia and neutropenia also improved the. He was discharged on vancomycin with dialysis for the next 7 days. He also had some hypotension. His Cardizem was held and in conjunction with nephrology he was started on Midodrine.   When I did his original admission to the facility I was uncertain about the nature of his this disability at the bedside. Most of what I see looks like a chronic neuropathy. Historically his wife states that he was quite functional up to the early part of the 2000's, his gait started a change in that time frame. A podiatrist apparently helped him get disability. He has chronic disabling gout as well. He was originally hospitalized in July with some form of MRSA sepsis. [facility] after this. He is not ambulated at all in approximately 3 months his wife is now making plans for him to return home. He lives between [place] and [place].  Past Medical History  Diagnosis  Date  . Arthritis   . Hypertension   . DJD (degenerative joint disease)   . DJD (degenerative joint disease), ankle and foot   . Arthritis   . Cellulitis   . Arthritis   . Chronic back pain   . Gout   . Dysrhythmia     afibrillation  . Atrial fibrillation   . Chronic kidney disease   . Colon cancer     2005  . 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 Charcot ankle     dr. Emilio Math  . Bacteremia    Past Surgical History  Procedure Laterality Date  . Foot surgery      right foot-  . Colon surgery      forcolon cancer, 2005, Dr. Marcell Anger in [place]  . Tonsillectomy    . Cystoscopy with urethral dilatation    . Back surgery      x2  . Cataract extraction w/phaco  01/14/2011    Procedure: CATARACT EXTRACTION PHACO AND INTRAOCULAR LENS PLACEMENT (IOC);  Surgeon: Gemma Payor;  Location: [facility];  Service: Ophthalmology;  Laterality: Left;  CDE=16.27  . Lymph node biopsy Right 07/11/2012    Procedure: CERVICAL LYMPH NODE BIOPSY;  Surgeon: Marlane Hatcher, MD;  Location: [facility];  Service: General;  Laterality: Right;  end @ 1101  . Portacath placement Right 07/11/2012    Procedure: INSERTION PORT-A-CATH;  Surgeon: Marlane Hatcher, MD;  Location: [facility];  Service: General;  Laterality:  Right;  . Port-a-cath removal Right 08/05/2012    Procedure: REMOVAL PORT-A-CATH;  Surgeon: Shelly Rubenstein, MD;  Location: [facility];  Service: General;  Laterality: Right;   Medications; allopurinol 100 daily, Os-Cal 600+ D. one tablet daily, vitamin B12 thousand daily, digoxin 0.125 every other day, Pepcid 20 twice a day, ferrous fumarate 325 2 tablets daily, folic acid 0.8 daily, NovoLog sliding scale, Reglan 10 mg 3 times a day, Midodrine 10mg bid, BRCA 75 twice a day  Social history; the patient lives between here in [place] in his own home with his wife. At some point in the last 2-3 months he was able to stand and walk although he is clearly not able to do that now. I don't believe he has made  any progress in terms of a meaningful transfer since his arrival here. Issues with regards to long-term institutionalization are present including the fact that the family owns property outside of the home. Apparently this is a " family farm" their daughter lives on this property. I have advised him to see an elder law attorney.  reports that he has never smoked. He has never used smokeless tobacco. He reports that he does not drink alcohol or use illicit drugs.  family history includes Breast cancer in his mother; Heart disease in his mother. There is no history of Anesthesia problems, Hypotension, Malignant hyperthermia  Review of systems Respiratory; no shortness of breath Cardiac no chest pain GI no abdominal pain GU he has no sensation of bowel or bladder continence  Physical exam;  GEN patient is lying flat in bed in no distress Respiratory clear entry bilaterally Cardiac 2/6 systolic ejection murmur appears to be euvolemic Abdomen liver edge palpable at the right costal margin there is no spleen Rectal; he has incontinent of black stool. He is on iron Lymph; none palpable in the cervical clavicular or axillary areas. There was no obvious mass in the left shoulder area but his wife describes Extremities; chronic venous stasis. He has what looks to be severe bilateral Charcot deformities in both feet fixed external rotation of the ankles he appears to have active arthritis in his right wrists across his metacarpophalangeals and his PIPs on the right. 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 Neurologic; the patient does not have antigravity strength in his lower extremities reflexes are absent. He has Charcot feet bilaterally however he does not have diabetes.  I cannot elicit any reflexes. His sensation to light touch is normal. He appears to have absent position sense in his feet. His cranial nerves seem intact. He is weak in the right arm, question  ruptured extensor tendons at the right wrist. Musculoskeletal active gout in several of the joints of his right hand and right wrist  Impression/plan #1 chronic renal failure with dialysis starting 2 months ago. 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 He received chemotherapy. Exact nature and current status of this is unclear #3 peripheral neuropathy probably chronic however he is not a diabetic. Historically this seems to predate his leukemia/lymphoma and therefore is probably not related to this or its treatment. 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 If there is more information on this I don't see it. At this point further workup would be largely academic #4 anemia secondary to chronic renal failure. Hemoglobin is 7.6 on 12/3. Not certain if he is on Procritat dialysis or  not  #5 acute on chronic gout in the right hand use NSAIDs on this. Regular NSAIDs for short periods seem indicated #6 extreme lower extremity weakness probably secondary to disuse and a underlying peripheral neuropathy. He is not able to stand. He will need a Hoyer lift and a hospital bed. I don't know that the woman has enough support to look after her husband and I have expressed this concern however for now she appears to determined. there is no evidence of upper motor neuron findings on exam. He has had previous low back surgery although this is clearly not the only issue #7 metabolic encephalopathy this appears to have resolved the patient appears to be cognitively intact

## 2012-12-28 ENCOUNTER — Ambulatory Visit (HOSPITAL_COMMUNITY)
Admit: 2012-12-28 | Discharge: 2012-12-28 | Disposition: A | Discharge status: Home / Self Care | Payer: Medicare Other | Attending: Internal Medicine | Admitting: Internal Medicine

## 2012-12-28 ENCOUNTER — Encounter (HOSPITAL_COMMUNITY): Payer: Medicare Other | Admitting: Speech Pathology

## 2012-12-28 NOTE — Procedures (Signed)
Objective Swallowing Evaluation: Modified Barium Swallowing Study   Patient Details  Name: Alex Petty MRN: [id number] Date of Birth: 03/08/1945  Today's Date: 12/28/2012 Time: 11:20 AM  - 11:46 AM    Past Medical History:  Past Medical History  Diagnosis Date  . Arthritis   . Hypertension   . DJD (degenerative joint disease)   . DJD (degenerative joint disease), ankle and foot   . Arthritis   . Cellulitis   . Arthritis   . Chronic back pain   . Gout   . Dysrhythmia     afibrillation  . Atrial fibrillation   . Chronic kidney disease   . Colon cancer     2005  . 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 Charcot ankle     dr. Emilio Math  . Bacteremia    Past Surgical History:  Past Surgical History  Procedure Laterality Date  . Foot surgery      right foot-  . Colon surgery      forcolon cancer, 2005, Dr. Marcell Anger in [place]  . Tonsillectomy    . Cystoscopy with urethral dilatation    . Back surgery      x2  . Cataract extraction w/phaco  01/14/2011    Procedure: CATARACT EXTRACTION PHACO AND INTRAOCULAR LENS PLACEMENT (IOC);  Surgeon: Gemma Payor;  Location: [facility];  Service: Ophthalmology;  Laterality: Left;  CDE=16.27  . Lymph node biopsy Right 07/11/2012    Procedure: CERVICAL LYMPH NODE BIOPSY;  Surgeon: Marlane Hatcher, MD;  Location: [facility];  Service: General;  Laterality: Right;  end @ 1101  . Portacath placement Right 07/11/2012    Procedure: INSERTION PORT-A-CATH;  Surgeon: Marlane Hatcher, MD;  Location: [facility];  Service: General;  Laterality: Right;  . Port-a-cath removal Right 08/05/2012    Procedure: REMOVAL PORT-A-CATH;  Surgeon: Shelly Rubenstein, MD;  Location: [facility];  Service: General;  Laterality: Right;   HPI:  Alex Petty is a 67 yo medically complex man who apparently has been in the hospital since July 2014. He is complains of difficulty swallowing breads and has strong coughing episodes at times.   Symptoms/Limitations Symptoms: Trouble swallowing breads Special Tests: MBSS  Recommendation/Prognosis  Clinical Impression Dysphagia Diagnosis: Mild oral phase dysphagia;Mild pharyngeal phase dysphagia  Pt presents with mild sensorimotor based oropharyngeal phase dysphagia characterized by prolonged oral transit with weak lingual manipulation, piecemeal deglutition, premature spillage with slight delay in swallow initiation with liquids, decreased tongue base retraction, and decreased epiglottic deflection resulting in penetration and aspiration (trace amounts) of thin liquids when taking large or multiple sips and mild vallecular residue (with liquids and puree). Pt had at least one episode of thins penetrating below the cords while taking pill and thins, however it was quickly expelled. At times it was difficult to visualize due to pt's shoulder positioning. Improved performance noted when pt was cued to take single, small sips of liquid. Recommend D3/mech soft and thin liquids via small, single sips with cues to clear throat periodically and repeat swallow. Pt may want to consider crushing pills in puree or taking whole in puree due to difficulty manipulating mixed consistencies.  Swallow Evaluation Recommendations Diet Recommendations: Dysphagia 3 (Mechanical Soft);Thin liquid Liquid Administration via: Cup;No straw Medication Administration: Whole meds with liquid (or crush large pills as able) Supervision: Patient able to self feed;Intermittent supervision to cue for compensatory strategies Compensations: Slow rate;Small sips/bites;Multiple dry swallows after each bite/sip;Clear throat intermittently;Effortful swallow Postural Changes and/or Swallow  Maneuvers: Seated upright 90 degrees;Upright 30-60 min after meal Oral Care Recommendations: Oral care BID Other Recommendations: Clarify dietary restrictions Follow up Recommendations: Skilled Nursing facility Prognosis Prognosis for Safe  Diet Advancement: Fair Individuals Consulted Consulted and Agree with Results and Recommendations: Patient;Family member/caregiver Report Sent to : Facility (Comment);Primary SLP  General:  Date of Onset: 12/20/12 HPI: Alex Petty is a 67 yo medically complex man who apparently has been in the hospital since July 2014. He is complains of difficulty swallowing breads and has strong coughing episodes at times. Type of Study: Modified Barium Swallowing Study Reason for Referral: Objectively evaluate swallowing function Diet Prior to this Study: Dysphagia 3 (soft);Thin liquids Temperature Spikes Noted: No Respiratory Status: Room air History of Recent Intubation: No Behavior/Cognition: Alert;Cooperative;Pleasant mood Oral Cavity - Dentition: Dentures, top;Dentures, bottom Oral Motor / Sensory Function: Within functional limits Self-Feeding Abilities: Able to feed self Patient Positioning: Upright in chair Baseline Vocal Quality: Clear Volitional Cough: Strong Volitional Swallow: Able to elicit Anatomy: Within functional limits Pharyngeal Secretions: Not observed secondary MBS  Reason for Referral:  Objectively evaluate swallowing function   Oral Phase Oral Preparation/Oral Phase Oral Phase: Impaired Oral - Solids Oral - Regular: Weak lingual manipulation;Piecemeal swallowing;Lingual/palatal residue;Delayed oral transit  Pharyngeal Phase  Pharyngeal Phase Pharyngeal Phase: Impaired Pharyngeal - Nectar Pharyngeal - Nectar Cup: Delayed swallow initiation;Premature spillage to valleculae;Reduced tongue base retraction Pharyngeal - Thin Pharyngeal - Thin Teaspoon: Premature spillage to valleculae;Premature spillage to pyriform sinuses Penetration/Aspiration details (thin teaspoon): Material does not enter airway Pharyngeal - Thin Cup: Delayed swallow initiation;Premature spillage to valleculae;Premature spillage to pyriform sinuses;Penetration/Aspiration during  swallow;Penetration/Aspiration after swallow;Trace aspiration;Pharyngeal residue - valleculae Penetration/Aspiration details (thin cup): Material does not enter airway;Material enters airway, remains ABOVE vocal cords then ejected out;Material enters airway, passes BELOW cords then ejected out;Material enters airway, CONTACTS cords then ejected out Pharyngeal - Solids Pharyngeal - Puree: Reduced tongue base retraction;Pharyngeal residue - valleculae Pharyngeal - Regular: Within functional limits Pharyngeal - Pill:  (pt penetrated/aspirated the liquid when taking pill)  Cervical Esophageal Phase  Cervical Esophageal Phase Cervical Esophageal Phase: [facility]   G-Codes Functional Assessment Tool Used: mbss Functional Limitations: Swallowing Swallow Current Status (Z6109): At least 1 percent but less than 20 percent impaired, limited or restricted Swallow Goal Status [phone]): At least 1 percent but less than 20 percent impaired, limited or restricted Swallow Discharge Status [phone]): At least 1 percent but less than 20 percent impaired, limited or restricted  Thank you,  Alex Petty, CCC-SLP [phone]   Alex Petty 12/28/2012, 12:44 PM                                                     Physician Treatment Plan  Your signature is required to indicate approval of the treatment plan/progress as stated above. Please make a copy of this report for your files and return this physician signed original in the self-addressed envelope or fax to [phone]. COMMENTS/CHANGES:__________________________________________________________________________________________________________________________   ____________________________________                      ____________________ Alex Petty  DATE

## 2012-12-29 LAB — GLUCOSE, CAPILLARY
Glucose-Capillary: 140 mg/dL — ABNORMAL HIGH (ref 70–99)
Glucose-Capillary: 89 mg/dL (ref 70–99)

## 2012-12-30 LAB — GLUCOSE, CAPILLARY
Glucose-Capillary: 129 mg/dL — ABNORMAL HIGH (ref 70–99)
Glucose-Capillary: 117 mg/dL — ABNORMAL HIGH (ref 70–99)

## 2012-12-31 LAB — GLUCOSE, CAPILLARY
Glucose-Capillary: 102 mg/dL — ABNORMAL HIGH (ref 70–99)
Glucose-Capillary: 106 mg/dL — ABNORMAL HIGH (ref 70–99)

## 2013-01-01 DIAGNOSIS — I1311 Hypertensive heart and chronic kidney disease without heart failure, with stage 5 chronic kidney disease, or end stage renal disease: Secondary | ICD-10-CM

## 2013-01-01 DIAGNOSIS — G609 Hereditary and idiopathic neuropathy, unspecified: Secondary | ICD-10-CM

## 2013-01-01 LAB — GLUCOSE, CAPILLARY
Glucose-Capillary: 100 mg/dL — ABNORMAL HIGH (ref 70–99)
Glucose-Capillary: 115 mg/dL — ABNORMAL HIGH (ref 70–99)
Glucose-Capillary: 107 mg/dL — ABNORMAL HIGH (ref 70–99)

## 2013-01-02 ENCOUNTER — Non-Acute Institutional Stay (SKILLED_NURSING_FACILITY): Payer: Medicare Other | Admitting: Internal Medicine

## 2013-01-02 DIAGNOSIS — G629 Polyneuropathy, unspecified: Secondary | ICD-10-CM

## 2013-01-02 DIAGNOSIS — I1311 Hypertensive heart and chronic kidney disease without heart failure, with stage 5 chronic kidney disease, or end stage renal disease: Secondary | ICD-10-CM

## 2013-01-02 LAB — GLUCOSE, CAPILLARY
Glucose-Capillary: 97 mg/dL (ref 70–99)
Glucose-Capillary: 109 mg/dL — ABNORMAL HIGH (ref 70–99)
Glucose-Capillary: 106 mg/dL — ABNORMAL HIGH (ref 70–99)
Glucose-Capillary: 108 mg/dL — ABNORMAL HIGH (ref 70–99)
Glucose-Capillary: 119 mg/dL — ABNORMAL HIGH (ref 70–99)
Glucose-Capillary: 87 mg/dL (ref 70–99)

## 2013-01-05 LAB — GLUCOSE, CAPILLARY: Glucose-Capillary: 134 mg/dL — ABNORMAL HIGH (ref 70–99)

## 2013-01-15 NOTE — Progress Notes (Signed)
Patient ID: Alex Petty, male   DOB: April 05, 1945, 68 y.o.   MRN: [id number]           PROGRESS NOTE  DATE:  01/01/2013    FACILITY: [facility]    LEVEL OF CARE:   SNF   Acute Visit/Discharge Visit     CHIEF COMPLAINT:   Pre-discharge review.     HISTORY OF PRESENT ILLNESS:  I had seen Alex Petty last week on 12/27/2012 for pre-discharge review.  He is a very frail man who was initially admitted to hospital with altered mental status and a UTI.    The nature of his disability is profound lower extremity weakness which I think is some form of chronic neuropathy.   He has had this for years.  However, I do not believe it has ever been completely worked up.    Nevertheless, he cannot stand at this point.  He will need a lift chair and hospital bed in order to transfer in and out of bed.  His wife is planning to take him home.  She has already purchased a modified wheelchair van.    His other medical issues include chronic dialysis.  His potassium was done today at 2.9.  I am not exactly sure how that happened.  However, I will replace this with 40 mEq three times a day for one set of doses, which should end tomorrow morning.  I will have them fax the labs to dialysis.    Also, his white count is 13.9 although I see no obvious cause for this.  His hemoglobin is 9.6.  I believe he is on Procrit and Aranesp at dialysis.   This may need to be reduced in terms of dosage as his hemoglobin was 7.6 on 12/13/2012.    Finally, he has terrible gout which right now appears to be mostly stable.   This may have something to do with his white count.  I am not really certain.    ASSESSMENT/PLAN:  Chronic renal failure.  Started dialysis two months ago.    Hypokalemia.  I will correct that.    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  He has been treated with chemotherapy.  The nature of this problem is unclear.   He will presumably follow with Oncology.    Severe peripheral  chronic motor neuropathy.  The exact nature of this is not clear.  However, he does not have antigravity strength and will not be able to pivot transfer.  A Hoyer lift and hospital bed, as noted.  He has a wheelchair already.    The patient is reviewed.  He is ready for discharge.  Potassium replaced.    CPT CODE: 52841 (greater than 30 minutes)

## 2013-01-30 ENCOUNTER — Ambulatory Visit: Payer: Self-pay | Admitting: Vascular Surgery

## 2013-01-30 LAB — CBC
HCT: 40.3 % (ref 40.0–52.0)
HGB: 12.5 g/dL — ABNORMAL LOW (ref 13.0–18.0)
MCH: 25.7 pg — AB (ref 26.0–34.0)
MCHC: 31.1 g/dL — ABNORMAL LOW (ref 32.0–36.0)
MCV: 83 fL (ref 80–100)
Platelet: 232 10*3/uL (ref 150–440)
RBC: 4.87 10*6/uL (ref 4.40–5.90)
RDW: 20.5 % — ABNORMAL HIGH (ref 11.5–14.5)
WBC: 10.1 10*3/uL (ref 3.8–10.6)

## 2013-01-30 LAB — BASIC METABOLIC PANEL
Anion Gap: 11 (ref 7–16)
BUN: 34 mg/dL — ABNORMAL HIGH (ref 7–18)
CHLORIDE: 101 mmol/L (ref 98–107)
CO2: 21 mmol/L (ref 21–32)
CREATININE: 5.29 mg/dL — AB (ref 0.60–1.30)
Calcium, Total: 9.3 mg/dL (ref 8.5–10.1)
EGFR (Non-African Amer.): 10 — ABNORMAL LOW
GFR CALC AF AMER: 12 — AB
Glucose: 100 mg/dL — ABNORMAL HIGH (ref 65–99)
OSMOLALITY: 274 (ref 275–301)
Potassium: 4 mmol/L (ref 3.5–5.1)
SODIUM: 133 mmol/L — AB (ref 136–145)

## 2013-02-08 ENCOUNTER — Ambulatory Visit: Payer: Self-pay | Admitting: Vascular Surgery

## 2013-02-08 LAB — DIGOXIN LEVEL: DIGOXIN: 1.21 ng/mL

## 2013-02-08 LAB — POTASSIUM: Potassium: 4 mmol/L (ref 3.5–5.1)

## 2013-02-12 ENCOUNTER — Other Ambulatory Visit (HOSPITAL_COMMUNITY): Payer: Self-pay | Admitting: Oncology

## 2013-02-19 ENCOUNTER — Other Ambulatory Visit (HOSPITAL_COMMUNITY): Payer: Self-pay | Admitting: Nephrology

## 2013-02-19 DIAGNOSIS — N186 End stage renal disease: Secondary | ICD-10-CM

## 2013-02-20 ENCOUNTER — Ambulatory Visit (HOSPITAL_COMMUNITY)
Admission: RE | Admit: 2013-02-20 | Discharge: 2013-02-20 | Disposition: A | Discharge status: Home / Self Care | Payer: Medicare Other | Source: Ambulatory Visit | Attending: Nephrology | Admitting: Nephrology

## 2013-02-20 ENCOUNTER — Other Ambulatory Visit (HOSPITAL_COMMUNITY): Payer: Self-pay | Admitting: Nephrology

## 2013-02-20 DIAGNOSIS — N186 End stage renal disease: Secondary | ICD-10-CM | POA: Insufficient documentation

## 2013-02-20 DIAGNOSIS — Y849 Medical procedure, unspecified as the cause of abnormal reaction of the patient, or of later complication, without mention of misadventure at the time of the procedure: Secondary | ICD-10-CM | POA: Insufficient documentation

## 2013-02-20 DIAGNOSIS — T82898A Other specified complication of vascular prosthetic devices, implants and grafts, initial encounter: Secondary | ICD-10-CM | POA: Insufficient documentation

## 2013-02-20 MED ORDER — VANCOMYCIN HCL IN DEXTROSE 1-5 GM/200ML-% IV SOLN
1000.0000 mg | Freq: Once | INTRAVENOUS | Status: AC
  Administered 2013-02-20: 1000 mg via INTRAVENOUS
  Filled 2013-02-20 (×2): qty 200

## 2013-02-20 MED ORDER — CHLORHEXIDINE GLUCONATE 4 % EX LIQD
CUTANEOUS | Status: AC
  Filled 2013-02-20: qty 45

## 2013-02-20 MED ORDER — HEPARIN SODIUM (PORCINE) 1000 UNIT/ML IJ SOLN
INTRAMUSCULAR | Status: DC
Start: 2013-02-20 — End: 2013-02-22
  Filled 2013-02-20: qty 1

## 2013-02-20 NOTE — Procedures (Signed)
Successful exchange of right chest dialysis catheter with fluoroscopy.  Tip in right atrium and ready to use.  No immediate complication.

## 2013-03-12 ENCOUNTER — Emergency Department (HOSPITAL_COMMUNITY): Payer: Medicare Other

## 2013-03-12 ENCOUNTER — Inpatient Hospital Stay (HOSPITAL_COMMUNITY)
Admission: EM | Admit: 2013-03-12 | Discharge: 2013-04-11 | DRG: 871 | Disposition: E | Payer: Medicare Other | Attending: Pulmonary Disease | Admitting: Pulmonary Disease

## 2013-03-12 ENCOUNTER — Encounter (HOSPITAL_COMMUNITY): Payer: Self-pay | Admitting: Emergency Medicine

## 2013-03-12 DIAGNOSIS — R6520 Severe sepsis without septic shock: Secondary | ICD-10-CM

## 2013-03-12 DIAGNOSIS — R778 Other specified abnormalities of plasma proteins: Secondary | ICD-10-CM

## 2013-03-12 DIAGNOSIS — R197 Diarrhea, unspecified: Secondary | ICD-10-CM | POA: Diagnosis present

## 2013-03-12 DIAGNOSIS — I509 Heart failure, unspecified: Secondary | ICD-10-CM | POA: Diagnosis not present

## 2013-03-12 DIAGNOSIS — E872 Acidosis, unspecified: Secondary | ICD-10-CM | POA: Diagnosis present

## 2013-03-12 DIAGNOSIS — A5211 Tabes dorsalis: Secondary | ICD-10-CM | POA: Diagnosis present

## 2013-03-12 DIAGNOSIS — Z85038 Personal history of other malignant neoplasm of large intestine: Secondary | ICD-10-CM

## 2013-03-12 DIAGNOSIS — Z66 Do not resuscitate: Secondary | ICD-10-CM | POA: Diagnosis not present

## 2013-03-12 DIAGNOSIS — I214 Non-ST elevation (NSTEMI) myocardial infarction: Secondary | ICD-10-CM | POA: Diagnosis present

## 2013-03-12 DIAGNOSIS — I2699 Other pulmonary embolism without acute cor pulmonale: Secondary | ICD-10-CM | POA: Diagnosis not present

## 2013-03-12 DIAGNOSIS — G9340 Encephalopathy, unspecified: Secondary | ICD-10-CM | POA: Diagnosis present

## 2013-03-12 DIAGNOSIS — Z881 Allergy status to other antibiotic agents status: Secondary | ICD-10-CM

## 2013-03-12 DIAGNOSIS — G8929 Other chronic pain: Secondary | ICD-10-CM | POA: Diagnosis present

## 2013-03-12 DIAGNOSIS — J9600 Acute respiratory failure, unspecified whether with hypoxia or hypercapnia: Secondary | ICD-10-CM

## 2013-03-12 DIAGNOSIS — I2789 Other specified pulmonary heart diseases: Secondary | ICD-10-CM | POA: Diagnosis present

## 2013-03-12 DIAGNOSIS — I120 Hypertensive chronic kidney disease with stage 5 chronic kidney disease or end stage renal disease: Secondary | ICD-10-CM | POA: Diagnosis present

## 2013-03-12 DIAGNOSIS — R4182 Altered mental status, unspecified: Secondary | ICD-10-CM

## 2013-03-12 DIAGNOSIS — Z888 Allergy status to other drugs, medicaments and biological substances status: Secondary | ICD-10-CM

## 2013-03-12 DIAGNOSIS — C9110 Chronic lymphocytic leukemia of B-cell type not having achieved remission: Secondary | ICD-10-CM | POA: Diagnosis present

## 2013-03-12 DIAGNOSIS — E43 Unspecified severe protein-calorie malnutrition: Secondary | ICD-10-CM | POA: Insufficient documentation

## 2013-03-12 DIAGNOSIS — Z8249 Family history of ischemic heart disease and other diseases of the circulatory system: Secondary | ICD-10-CM

## 2013-03-12 DIAGNOSIS — I469 Cardiac arrest, cause unspecified: Secondary | ICD-10-CM | POA: Diagnosis not present

## 2013-03-12 DIAGNOSIS — R7989 Other specified abnormal findings of blood chemistry: Secondary | ICD-10-CM

## 2013-03-12 DIAGNOSIS — A419 Sepsis, unspecified organism: Principal | ICD-10-CM | POA: Diagnosis present

## 2013-03-12 DIAGNOSIS — I369 Nonrheumatic tricuspid valve disorder, unspecified: Secondary | ICD-10-CM

## 2013-03-12 DIAGNOSIS — R579 Shock, unspecified: Secondary | ICD-10-CM

## 2013-03-12 DIAGNOSIS — Z7401 Bed confinement status: Secondary | ICD-10-CM

## 2013-03-12 DIAGNOSIS — I493 Ventricular premature depolarization: Secondary | ICD-10-CM

## 2013-03-12 DIAGNOSIS — R6521 Severe sepsis with septic shock: Secondary | ICD-10-CM

## 2013-03-12 DIAGNOSIS — N390 Urinary tract infection, site not specified: Secondary | ICD-10-CM | POA: Diagnosis present

## 2013-03-12 DIAGNOSIS — R799 Abnormal finding of blood chemistry, unspecified: Secondary | ICD-10-CM

## 2013-03-12 DIAGNOSIS — M549 Dorsalgia, unspecified: Secondary | ICD-10-CM | POA: Diagnosis present

## 2013-03-12 DIAGNOSIS — Z992 Dependence on renal dialysis: Secondary | ICD-10-CM

## 2013-03-12 DIAGNOSIS — Z9849 Cataract extraction status, unspecified eye: Secondary | ICD-10-CM

## 2013-03-12 DIAGNOSIS — M1A00X1 Idiopathic chronic gout, unspecified site, with tophus (tophi): Secondary | ICD-10-CM | POA: Diagnosis present

## 2013-03-12 DIAGNOSIS — Z961 Presence of intraocular lens: Secondary | ICD-10-CM

## 2013-03-12 DIAGNOSIS — Z6833 Body mass index (BMI) 33.0-33.9, adult: Secondary | ICD-10-CM

## 2013-03-12 DIAGNOSIS — I4891 Unspecified atrial fibrillation: Secondary | ICD-10-CM | POA: Diagnosis present

## 2013-03-12 DIAGNOSIS — N186 End stage renal disease: Secondary | ICD-10-CM

## 2013-03-12 DIAGNOSIS — Z803 Family history of malignant neoplasm of breast: Secondary | ICD-10-CM

## 2013-03-12 LAB — URINALYSIS, ROUTINE W REFLEX MICROSCOPIC
BILIRUBIN URINE: NEGATIVE
GLUCOSE, UA: NEGATIVE mg/dL
Nitrite: NEGATIVE
PH: 6.5 (ref 5.0–8.0)
PROTEIN: 100 mg/dL — AB
Specific Gravity, Urine: 1.02 (ref 1.005–1.030)
Urobilinogen, UA: 0.2 mg/dL (ref 0.0–1.0)

## 2013-03-12 LAB — LACTIC ACID, PLASMA
LACTIC ACID, VENOUS: 5 mmol/L — AB (ref 0.5–2.2)
Lactic Acid, Venous: 6.3 mmol/L — ABNORMAL HIGH (ref 0.5–2.2)
Lactic Acid, Venous: 5.4 mmol/L — ABNORMAL HIGH (ref 0.5–2.2)
Lactic Acid, Venous: 4.7 mmol/L — ABNORMAL HIGH (ref 0.5–2.2)

## 2013-03-12 LAB — GLUCOSE, CAPILLARY: GLUCOSE-CAPILLARY: 110 mg/dL — AB (ref 70–99)

## 2013-03-12 LAB — CBC WITH DIFFERENTIAL/PLATELET
BASOS PCT: 0 % (ref 0–1)
Basophils Absolute: 0 10*3/uL (ref 0.0–0.1)
Eosinophils Absolute: 0 10*3/uL (ref 0.0–0.7)
Eosinophils Relative: 0 % (ref 0–5)
HCT: 36.8 % — ABNORMAL LOW (ref 39.0–52.0)
HEMOGLOBIN: 11.7 g/dL — AB (ref 13.0–17.0)
Lymphocytes Relative: 14 % (ref 12–46)
Lymphs Abs: 2 10*3/uL (ref 0.7–4.0)
MCH: 26.7 pg (ref 26.0–34.0)
MCHC: 31.8 g/dL (ref 30.0–36.0)
MCV: 83.8 fL (ref 78.0–100.0)
MONOS PCT: 8 % (ref 3–12)
Monocytes Absolute: 1.1 10*3/uL — ABNORMAL HIGH (ref 0.1–1.0)
NEUTROS ABS: 11.1 10*3/uL — AB (ref 1.7–7.7)
Neutrophils Relative %: 78 % — ABNORMAL HIGH (ref 43–77)
PLATELETS: 274 10*3/uL (ref 150–400)
RBC: 4.39 MIL/uL (ref 4.22–5.81)
RDW: 20.1 % — ABNORMAL HIGH (ref 11.5–15.5)
WBC: 14.3 10*3/uL — ABNORMAL HIGH (ref 4.0–10.5)

## 2013-03-12 LAB — POC OCCULT BLOOD, ED: FECAL OCCULT BLD: POSITIVE — AB

## 2013-03-12 LAB — COMPREHENSIVE METABOLIC PANEL
ALK PHOS: 95 U/L (ref 39–117)
ALT: 6 U/L (ref 0–53)
AST: 12 U/L (ref 0–37)
Albumin: 2.7 g/dL — ABNORMAL LOW (ref 3.5–5.2)
BUN: 45 mg/dL — AB (ref 6–23)
CHLORIDE: 91 meq/L — AB (ref 96–112)
CO2: 19 mEq/L (ref 19–32)
Calcium: 9.4 mg/dL (ref 8.4–10.5)
Creatinine, Ser: 5.11 mg/dL — ABNORMAL HIGH (ref 0.50–1.35)
GFR calc Af Amer: 12 mL/min — ABNORMAL LOW (ref >90)
GFR calc non Af Amer: 10 mL/min — ABNORMAL LOW (ref >90)
Glucose, Bld: 150 mg/dL — ABNORMAL HIGH (ref 70–99)
POTASSIUM: 4.4 meq/L (ref 3.7–5.3)
SODIUM: 135 meq/L — AB (ref 137–147)
TOTAL PROTEIN: 6.4 g/dL (ref 6.0–8.3)
Total Bilirubin: 0.3 mg/dL (ref 0.3–1.2)

## 2013-03-12 LAB — HEPARIN LEVEL (UNFRACTIONATED): Heparin Unfractionated: <0.1 IU/mL — ABNORMAL LOW (ref 0.30–0.70)

## 2013-03-12 LAB — CBG MONITORING, ED: GLUCOSE-CAPILLARY: 157 mg/dL — AB (ref 70–99)

## 2013-03-12 LAB — TYPE AND SCREEN
ABO/RH(D): A POS
ANTIBODY SCREEN: NEGATIVE

## 2013-03-12 LAB — PROCALCITONIN: Procalcitonin: 1.24 ng/mL

## 2013-03-12 LAB — URINE MICROSCOPIC-ADD ON

## 2013-03-12 LAB — TROPONIN I
Troponin I: 0.57 ng/mL (ref <0.30)
Troponin I: 1.06 ng/mL (ref <0.30)

## 2013-03-12 LAB — MRSA PCR SCREENING: MRSA by PCR: NEGATIVE

## 2013-03-12 MED ORDER — HEPARIN BOLUS VIA INFUSION
2000.0000 [IU] | Freq: Once | INTRAVENOUS | Status: AC
  Administered 2013-03-12: 2000 [IU] via INTRAVENOUS
  Filled 2013-03-12: qty 2000

## 2013-03-12 MED ORDER — LEVOFLOXACIN IN D5W 750 MG/150ML IV SOLN
750.0000 mg | Freq: Once | INTRAVENOUS | Status: AC
  Administered 2013-03-12: 750 mg via INTRAVENOUS
  Filled 2013-03-12: qty 150

## 2013-03-12 MED ORDER — ASPIRIN 300 MG RE SUPP
300.0000 mg | Freq: Once | RECTAL | Status: AC
  Administered 2013-03-12: 300 mg via RECTAL
  Filled 2013-03-12: qty 1

## 2013-03-12 MED ORDER — SODIUM CHLORIDE 0.9 % IV SOLN
1000.0000 mL | Freq: Once | INTRAVENOUS | Status: AC
  Administered 2013-03-12: 1000 mL via INTRAVENOUS

## 2013-03-12 MED ORDER — HEPARIN (PORCINE) IN NACL 100-0.45 UNIT/ML-% IJ SOLN
INTRAMUSCULAR | Status: AC
  Administered 2013-03-12: 4000 [IU] via INTRAVENOUS
  Filled 2013-03-12: qty 250

## 2013-03-12 MED ORDER — SODIUM CHLORIDE 0.9 % IV SOLN: 1000.0000 mL | INTRAVENOUS | Status: DC

## 2013-03-12 MED ORDER — HEPARIN SODIUM (PORCINE) 5000 UNIT/ML IJ SOLN: 4000.0000 [IU] | Freq: Once | INTRAMUSCULAR | Status: AC

## 2013-03-12 MED ORDER — LEVOFLOXACIN IN D5W 250 MG/50ML IV SOLN: 250.0000 mg | INTRAVENOUS | Status: DC

## 2013-03-12 MED ORDER — DEXTROSE 5 % IV SOLN: 1.0000 g | Freq: Every day | INTRAVENOUS | Status: DC

## 2013-03-12 MED ORDER — DEXTROSE 5 % IV SOLN
2.0000 g | Freq: Two times a day (BID) | INTRAVENOUS | Status: DC
  Administered 2013-03-12 – 2013-03-13 (×3): 2 g via INTRAVENOUS
  Filled 2013-03-12 (×4): qty 2

## 2013-03-12 MED ORDER — VANCOMYCIN HCL IN DEXTROSE 1-5 GM/200ML-% IV SOLN
1000.0000 mg | INTRAVENOUS | Status: DC
  Administered 2013-03-12: 1000 mg via INTRAVENOUS
  Filled 2013-03-12: qty 200

## 2013-03-12 MED ORDER — WHITE PETROLATUM GEL
Status: AC
Start: 2013-03-12 — End: 2013-03-13
  Filled 2013-03-12: qty 5

## 2013-03-12 MED ORDER — NOREPINEPHRINE BITARTRATE 1 MG/ML IJ SOLN
2.0000 ug/min | INTRAVENOUS | Status: DC
  Administered 2013-03-12: 5 ug/min via INTRAVENOUS
  Filled 2013-03-12 (×3): qty 4

## 2013-03-12 MED ORDER — DEXTROSE 5 % IV SOLN: 2.0000 g | Freq: Once | INTRAVENOUS | Status: DC

## 2013-03-12 MED ORDER — SODIUM CHLORIDE 0.9 % IV SOLN
250.0000 mL | INTRAVENOUS | Status: DC | PRN
  Administered 2013-03-12: 250 mL via INTRAVENOUS

## 2013-03-12 MED ORDER — NOREPINEPHRINE BITARTRATE 1 MG/ML IJ SOLN
2.0000 ug/min | INTRAVENOUS | Status: DC
  Administered 2013-03-12: 2 ug/min via INTRAVENOUS
  Administered 2013-03-12: 5 ug/min via INTRAVENOUS
  Filled 2013-03-12: qty 4

## 2013-03-12 MED ORDER — HYDROCORTISONE SOD SUCCINATE 100 MG PF FOR IT USE: 100.0000 mg | Freq: Once | INTRAMUSCULAR | Status: DC

## 2013-03-12 MED ORDER — HYDROCORTISONE NA SUCCINATE PF 100 MG IJ SOLR
100.0000 mg | Freq: Once | INTRAMUSCULAR | Status: AC
  Administered 2013-03-12: 100 mg via INTRAVENOUS
  Filled 2013-03-12: qty 2

## 2013-03-12 MED ORDER — PHENYLEPHRINE HCL 10 MG/ML IJ SOLN
30.0000 ug/min | INTRAVENOUS | Status: DC
  Administered 2013-03-12: 30 ug/min via INTRAVENOUS
  Administered 2013-03-12: 175 ug/min via INTRAVENOUS
  Administered 2013-03-12: 150 ug/min via INTRAVENOUS
  Administered 2013-03-13 (×2): 200 ug/min via INTRAVENOUS
  Filled 2013-03-12 (×6): qty 4

## 2013-03-12 MED ORDER — HEPARIN (PORCINE) IN NACL 100-0.45 UNIT/ML-% IJ SOLN
1400.0000 [IU]/h | INTRAMUSCULAR | Status: DC
  Administered 2013-03-12: 1200 [IU]/h via INTRAVENOUS
  Administered 2013-03-12: 4000 [IU] via INTRAVENOUS
  Filled 2013-03-12 (×3): qty 250

## 2013-03-12 MED ORDER — SODIUM CHLORIDE 0.9 % IV SOLN: INTRAVENOUS | Status: DC

## 2013-03-12 NOTE — H&P (Addendum)
Name: Alex Petty MRN: [id number] DOB: 1946/01/11    ADMISSION DATE:  03/26/2013 CONSULTATION DATE:  03/24/2013  REFERRING MD :  [facility] PRIMARY SERVICE: PCCM  CHIEF COMPLAINT:  Weakness, AMS  BRIEF PATIENT DESCRIPTION:  68 y.o. M with PMH including ESRD presented to [facility] with weakness, AMS, and hypotension - likely septic, transferred to [facility] ICU for further care.  SIGNIFICANT EVENTS / STUDIES:  3/2 - admitted via ED 3/2 LUE [place] to R/O clot:   LINES / TUBES: R IJ HD cath (tunelled) 10/17/12 >>  R fem CVL 3/02 >>   CULTURES: Blood 3/2 >>  Urine 3/2 >>  MRSA 3/2 >>   ANTIBIOTICS: Levaquin 3/2 X 1 Aztreonam 3/2 X 1 Vanc 3/2 >>  Cefepime 3/2 >>   HISTORY OF PRESENT ILLNESS:  Alex Petty is a 68 y.o. M with multiple co-morbidities who presented to [facility] with AMS and progressive weakness.  Per him and his wife, he had been weak for the past few days; however, it progressed to a point where it got so bad that he had to go to the ED.  Besides weakness, pt had been in his usual state of health.  Prior to going to the ED, his wife reported that he was cool to the touch and significantly diaphoretic.  He denies any chills, cough, chest pain, SOB, N/V, abdominal pain.  Per ED notes, pt has chronic diarrhea that is dark colored due to him being on daily iron therapy.  He does not produce any urine and goes to dialysis on M/W/F. In ED, pt was hypotensive (00'T systolic) and had an elevated lactate (6.3).  He received 3.5 L of IVF and was started on levophed.  Pt was then transferred to the [facility] ICU under PCCM's care.  PAST MEDICAL HISTORY :  Past Medical History  Diagnosis Date   CLL (chronic lymphocytic leukemia)    07/19/2012  . Arthritis   . Hypertension   . DJD (degenerative joint disease)   . Severe Charcot deformities, B feet   . Cellulitis   . Chronic back pain   . Gout   . Chronic Atrial fibrillation   . Chronic kidney disease   . Colon cancer     2005  .  Bacteremia    Past Surgical History  Procedure Laterality Date  . Foot surgery      right foot-  . Colon surgery      forcolon cancer, 2005, Dr. Leanora Ivanoff in [place]  . Tonsillectomy    . Cystoscopy with urethral dilatation    . Back surgery      x2  . Cataract extraction w/phaco  01/14/2011    Procedure: CATARACT EXTRACTION PHACO AND INTRAOCULAR LENS PLACEMENT (IOC);  Surgeon: Tonny Branch;  Location: [facility];  Service: Ophthalmology;  Laterality: Left;  CDE=16.27  . Lymph node biopsy Right 07/11/2012    Procedure: CERVICAL LYMPH NODE BIOPSY;  Surgeon: Scherry Ran, MD;  Location: [facility];  Service: General;  Laterality: Right;  end @ 1101  . Portacath placement Right 07/11/2012    Procedure: INSERTION PORT-A-CATH;  Surgeon: Scherry Ran, MD;  Location: [facility];  Service: General;  Laterality: Right;  . Port-a-cath removal Right 08/05/2012    Procedure: REMOVAL PORT-A-CATH;  Surgeon: Harl Bowie, MD;  Location: [place];  Service: General;  Laterality: Right;  . Dialysis fistula creation     Prior to Admission medications   Medication Sig Start Date End Date  Taking? Authorizing Provider  allopurinol (ZYLOPRIM) 100 MG tablet Take 100 mg by mouth daily.   Yes Historical Provider, MD  Amino Acids-Protein Hydrolys (FEEDING SUPPLEMENT, PRO-STAT SUGAR FREE 64,) LIQD Take 30 mLs by mouth daily.    Yes Historical Provider, MD  digoxin (LANOXIN) 0.125 MG tablet Take 0.125 mg by mouth every other day.   Yes Historical Provider, MD  famotidine (PEPCID) 20 MG tablet Take 1 tablet (20 mg total) by mouth 2 (two) times daily. 11/06/12  Yes Maryann Mikhail, DO  ferrous fumarate (HEMOCYTE - 106 MG FE) 325 (106 FE) MG TABS tablet Take 1 tablet by mouth 2 (two) times daily.   Yes Historical Provider, MD  Folic Acid 0.8 MG CAPS Take 1 capsule by mouth daily.   Yes Historical Provider, MD  HYDROcodone-acetaminophen (NORCO) 7.5-325 MG per tablet Take 1 tablet by mouth every 6 (six) hours as needed for  moderate pain.   Yes Historical Provider, MD  midodrine (PROAMATINE) 10 MG tablet Take 1 tablet (10 mg total) by mouth 2 (two) times daily with a meal. 12/05/12  Yes Kathie Dike, MD  pregabalin (LYRICA) 75 MG capsule Take 1 capsule (75 mg total) by mouth 2 (two) times daily. 12/06/12  Yes Mahima Bubba Camp, MD  saccharomyces boulardii (FLORASTOR) 250 MG capsule Take 250 mg by mouth 2 (two) times daily.   Yes Historical Provider, MD  vitamin B-12 1000 MCG tablet Take 1 tablet (1,000 mcg total) by mouth daily. 08/17/12  Yes Debbe Odea, MD  vitamin C (ASCORBIC ACID) 500 MG tablet Take 500 mg by mouth 2 (two) times daily.    Yes Historical Provider, MD  Calcium Carb-Cholecalciferol (CALCIUM 600 + D PO) Take 1 tablet by mouth daily.    Historical Provider, MD  metoCLOPramide (REGLAN) 10 MG tablet Take 10 mg by mouth 3 (three) times daily.    Historical Provider, MD  vancomycin (VANCOCIN) 1 GM/200ML SOLN Inject 200 mLs (1,000 mg total) into the vein Every Tuesday,Thursday,and Saturday with dialysis. For 7 more days 12/05/12   Kathie Dike, MD   Allergies  Allergen Reactions  . Ace Inhibitors Other (See Comments)    cough  . Rocephin [Ceftriaxone Sodium] Hives  . Beta Adrenergic Blockers Rash    FAMILY HISTORY:  Family History  Problem Relation Age of Onset  . Anesthesia problems Neg Hx   . Hypotension Neg Hx   . Malignant hyperthermia Neg Hx   . Pseudochol deficiency Neg Hx   . Breast cancer Mother   . Heart disease Mother    SOCIAL HISTORY:  reports that he has never smoked. He has never used smokeless tobacco. He reports that he does not drink alcohol or use illicit drugs.  REVIEW OF SYSTEMS:  Negative except as stated in HPI.  SUBJECTIVE:  More alert now vs when came to ED.  BP up to 120's.  VITAL SIGNS: Temp:  [97.8 F (36.6 C)-99.4 F (37.4 C)] 99.4 F (37.4 C) (03/02 0707) Pulse Rate:  [76-85] 79 (03/02 0736) Resp:  [13-18] 13 (03/02 0826) BP: (53-132)/(37-80) 132/80 mmHg  (03/02 0826) SpO2:  [92 %-98 %] 98 % (03/02 0736) Weight:  [180 lb (81.647 kg)] 180 lb (81.647 kg) (03/02 0711) HEMODYNAMICS:   VENTILATOR SETTINGS:   INTAKE / OUTPUT: Intake/Output   None     PHYSICAL EXAMINATION: General: Pleasant, NAD, mildly confused. Neuro: A&O x 3, non-focal.  Mildly encephalopathic. HEENT: [place]/AT. PERRL, sclerae anicteric. Cardiovascular: Distant heart sounds, irregular, soft syst M. Lungs: Respirations even and  unlabored.  CTA bilaterally, No W/R/R. Abdomen: BS x 4, soft, NT/ND.  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  Skin: Intact, cool, no rashes.   LABS:  CBC  Recent Labs Lab 03/18/2013 0543  WBC 14.3*  HGB 11.7*  HCT 36.8*  PLT 274   Coag's No results found for this basename: APTT, INR,  in the last 168 hours BMET  Recent Labs Lab 03/17/2013 0543  NA 135*  K 4.4  CL 91*  CO2 19  BUN 45*  CREATININE 5.11*  GLUCOSE 150*   Electrolytes  Recent Labs Lab 03/19/2013 0543  CALCIUM 9.4   Sepsis Markers  Recent Labs Lab 04/05/2013 0612  LATICACIDVEN 6.3*   Liver Enzymes  Recent Labs Lab 03/11/2013 0543  AST 12  ALT 6  ALKPHOS 95  BILITOT 0.3  ALBUMIN 2.7*   Cardiac Enzymes  Recent Labs Lab 03/18/2013 0543  TROPONINI 0.57*   Glucose  Recent Labs Lab 04/06/2013 0536  GLUCAP 157*    Imaging Dg Chest Port 1 View  04/10/2013   CLINICAL DATA:  Hypertension  EXAM: PORTABLE CHEST - 1 VIEW  COMPARISON:  01/30/2013  FINDINGS: Chronic cardiopericardial enlargement. Stable positioning of right IJ dialysis catheter. No edema or consolidation. No effusion or pneumothorax. Severe bilateral glenohumeral osteoarthritis.  IMPRESSION: No active disease.   Electronically Signed   By: Jorje Guild M.D.   On: 04/03/2013 05:59    ASSESSMENT / PLAN:  PULMONARY A: No acute issues. P:   - Maintain SpO2 > 92%. - Pulmonary Hygiene.  CARDIOVASCULAR A:  Shock - septic  vs cardiogenic Severe ventricular Ectopy/Abberancy  CAF, historically intolerant to full anticoagulation Chronic hypotension (on Midodrine chronically) Mild troponin elevation/Concern for NSTEMI Concern for L IJ clot noted during [place] prior to aborted CVL attempt P:  - Cards consult. - EKG and echo. - Will continue Heparin for now, defer to Cardiology on whether to continue. - change NE to phenylephrine and titrate for MAP goal of 55 mmHg - LUE complete US to assess possible clot  RENAL A:  ESRD AG Acidosis - elevated lactate. P:   - Nephrology consult.  - Trend lactate. - Monitor BMET intermittently - Monitor I/Os - Correct electrolytes as indicated   GASTROINTESTINAL A:  No acute issues. P:   - Advance diet as tolerated. - No role SUP at this point.  HEMATOLOGIC A:  No acute issues. P:  - DVT prophylaxis:  SCD's, on Heparin already. - CBC in AM.  INFECTIOUS A:  Presumed severe sepsis, unclear source Leukocytosis UTI P:   - Micro and abx as above - Trend PCT. - Monitor fever curve/WBC's.  ENDOCRINE A: At risk for hypoglycemia given renal dysfunction R/o AI P:   - Check Cortisol - Hydrocortisone 100 x 1 after cortisol drawn - Monitor CBG's, will start SSI if > 180.  NEUROLOGIC/RHEUM A:  Mild encephalopathy, likely due to hypotension/poss sepsis Chronic Pain Gout P:   - Holding Lyrica. - Holding allopurinol. - Monitor.  Montey Hora, PA - [facility] [place] [facility] Pgr: [phone]  or [phone]    I have personally obtained a history, examined the patient, evaluated laboratory and imaging results, formulated the assessment and plan and placed orders. CRITICAL CARE: The patient is critically ill with multiple organ systems failure and requires high complexity decision making for assessment and support, frequent evaluation and titration of therapies, application  of advanced monitoring technologies and extensive interpretation  of multiple databases. Critical Care Time devoted to patient care services described in this note is 45 minutes.   Merton Border, MD ; [facility] [phone].  After 5:30 PM or weekends, call [phone] Pulmonary and [place] Pager: [phone]  03/11/2013, 9:23 AM

## 2013-03-12 NOTE — Progress Notes (Signed)
ANTIBIOTIC CONSULT NOTE - INITIAL  Pharmacy Consult for Vancomcyin and Cefepime Indication: Sepsis  Allergies  Allergen Reactions  . Ace Inhibitors Other (See Comments)    cough  . Rocephin [Ceftriaxone Sodium] Hives  . Beta Adrenergic Blockers Rash   Patient Measurements: Height: 5\' 8"  (172.7 cm) Weight: 180 lb (81.647 kg) IBW/kg (Calculated) : 68.4  Vital Signs: Temp: 99.4 F (37.4 C) (03/02 0707) Temp src: Rectal (03/02 0707) BP: 70/50 mmHg (03/02 0945) Pulse Rate: 83 (03/02 0945)  Labs:  Recent Labs  03/30/2013 0543  WBC 14.3*  HGB 11.7*  PLT 274  CREATININE 5.11*   Estimated Creatinine Clearance: 13.4 ml/min (by C-G formula based on Cr of 5.11).  Microbiology: Recent Results (from the past 720 hour(s))  CULTURE, BLOOD (ROUTINE X 2)     Status: None   Collection Time    04/04/2013  7:22 AM      Result Value Ref Range Status   Specimen Description Blood   Final   Special Requests NONE   Final   Culture NO GROWTH <24 HRS   Final   Report Status PENDING   Incomplete  CULTURE, BLOOD (ROUTINE X 2)     Status: None   Collection Time    03/27/2013  7:22 AM      Result Value Ref Range Status   Specimen Description Blood   Final   Special Requests NONE   Final   Culture NO GROWTH <24 HRS   Final   Report Status PENDING   Incomplete   Medical History: Past Medical History  Diagnosis Date  . Arthritis   . Hypertension   . DJD (degenerative joint disease)   . DJD (degenerative joint disease), ankle and foot   . Arthritis   . Cellulitis   . Arthritis   . Chronic back pain   . Gout   . Dysrhythmia     afibrillation  . Atrial fibrillation   . Chronic kidney disease   . Colon cancer     2005  . 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 Charcot ankle     dr. Allene Pyo  . Bacteremia    Medications:  Anti-infectives   Start     Dose/Rate Route Frequency Ordered Stop   03/20/2013 1400  aztreonam (AZACTAM) 1 g in dextrose 5 % 50 mL IVPB  Status:   Discontinued     1 g 100 mL/hr over 30 Minutes Intravenous Daily 03/11/2013 1008 03/21/2013 1020   03/26/2013 1000  Levofloxacin (LEVAQUIN) IVPB 250 mg  Status:  Discontinued     250 mg 50 mL/hr over 60 Minutes Intravenous Every 24 hours 03/29/2013 1004 04/01/2013 1020   04/09/2013 0700  levofloxacin (LEVAQUIN) IVPB 750 mg     750 mg 100 mL/hr over 90 Minutes Intravenous  Once 03/28/2013 0653 03/17/2013 0849   04/10/2013 0700  aztreonam (AZACTAM) 2 g in dextrose 5 % 50 mL IVPB  Status:  Discontinued     2 g 100 mL/hr over 30 Minutes Intravenous  Once 04/01/2013 0653 04/01/2013 1020     Assessment: 46 yom admitted with altered mental status.  He is being treated for urosepsis.  He is an ESRD patient with a MWF HD schedule.  He also has a PMH of UTI's.  He received one dose of Levofloxacin and Aztreonam.  His antibiotic regimen has been changed to a broader regimen with Vancomycin and Cefepime.  He has an allergy to Ceftriaxone of a rash but no noted respiratory  compromise.  We have been asked to assist with dosing.  Goal of Therapy:  Vancomycin trough level 15-20 mcg/ml  Plan:  1.  Will begin IV Vancomycin 1gm with each HD 2.  Begin Cefepime 2 gm every 12 hours 3.  Monitor culture data and streamline antibiotics as appropriate  Rober Minion, PharmD., MS Clinical Pharmacist Pager:  [phone] Thank you for allowing pharmacy to be part of this patients care team. 04/01/2013,10:40 AM

## 2013-03-12 NOTE — ED Provider Notes (Signed)
CSN: [id number]     Arrival date & time 03/15/2013  [id number] History   First MD Initiated Contact with Patient 03/26/2013 0532     Chief Complaint  Patient presents with  . Weakness  . Altered Mental Status     (Consider location/radiation/quality/duration/timing/severity/associated sxs/prior Treatment) The history is provided by the spouse. The history is limited by the condition of the patient (Altered mental status).   68 year old male with history of chronic lymphocytic leukemia and end-stage renal disease on dialysis had been in his usual state of health with until this evening when he stated he would just wasn't feeling well. It is wife noted that he had episodes of diaphoresis but there was no cough, no fever, no chills, no nausea, no vomiting. He is normally bedridden and unable to and relate. He seems less alert than normal. He has chronic diarrhea which is always dark because he is on iron treatment. He has required numerous blood transfusions the past. He has history of UTIs as recently as 4 months ago but is now producing absolutely no urine.  Past Medical History  Diagnosis Date  . Arthritis   . Hypertension   . DJD (degenerative joint disease)   . DJD (degenerative joint disease), ankle and foot   . Arthritis   . Cellulitis   . Arthritis   . Chronic back pain   . Gout   . Dysrhythmia     afibrillation  . Atrial fibrillation   . Chronic kidney disease   . Colon cancer     2005  . 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 Charcot ankle     dr. Allene Pyo  . Bacteremia    Past Surgical History  Procedure Laterality Date  . Foot surgery      right foot-  . Colon surgery      forcolon cancer, 2005, Dr. Leanora Ivanoff in [place]  . Tonsillectomy    . Cystoscopy with urethral dilatation    . Back surgery      x2  . Cataract extraction w/phaco  01/14/2011    Procedure: CATARACT EXTRACTION PHACO AND INTRAOCULAR LENS PLACEMENT (IOC);  Surgeon: Tonny Branch;  Location: [facility];   Service: Ophthalmology;  Laterality: Left;  CDE=16.27  . Lymph node biopsy Right 07/11/2012    Procedure: CERVICAL LYMPH NODE BIOPSY;  Surgeon: Scherry Ran, MD;  Location: [facility];  Service: General;  Laterality: Right;  end @ 1101  . Portacath placement Right 07/11/2012    Procedure: INSERTION PORT-A-CATH;  Surgeon: Scherry Ran, MD;  Location: [facility];  Service: General;  Laterality: Right;  . Port-a-cath removal Right 08/05/2012    Procedure: REMOVAL PORT-A-CATH;  Surgeon: Harl Bowie, MD;  Location: [facility];  Service: General;  Laterality: Right;   Family History  Problem Relation Age of Onset  . Anesthesia problems Neg Hx   . Hypotension Neg Hx   . Malignant hyperthermia Neg Hx   . Pseudochol deficiency Neg Hx   . Breast cancer Mother   . Heart disease Mother    History  Substance Use Topics  . Smoking status: Never Smoker   . Smokeless tobacco: Never Used  . Alcohol Use: No    Review of Systems  Unable to perform ROS: Mental status change      Allergies  Ace inhibitors; Rocephin; and Beta adrenergic blockers  Home Medications   Current Outpatient Rx  Name  Route  Sig  Dispense  Refill  . allopurinol (ZYLOPRIM) 100  MG tablet   Oral   Take 100 mg by mouth daily.         . Amino Acids-Protein Hydrolys (FEEDING SUPPLEMENT, PRO-STAT SUGAR FREE 64,) LIQD   Oral   Take 30 mLs by mouth 3 (three) times daily with meals.         . Calcium Carb-Cholecalciferol (CALCIUM 600 + D PO)   Oral   Take 1 tablet by mouth daily.         . digoxin (LANOXIN) 0.125 MG tablet   Oral   Take 0.125 mg by mouth every other day.         . famotidine (PEPCID) 20 MG tablet   Oral   Take 1 tablet (20 mg total) by mouth 2 (two) times daily.         . ferrous fumarate (HEMOCYTE - 106 MG FE) 325 (106 FE) MG TABS tablet   Oral   Take 1 tablet by mouth 2 (two) times daily.         . Folic Acid 0.8 MG CAPS   Oral   Take 1 capsule by mouth daily.         .  insulin aspart (NOVOLOG) 100 UNIT/ML injection   Subcutaneous   Inject 2-12 Units into the skin 3 (three) times daily with meals. If sugar is 150-200=2 units, 201-250=4 units, 251-300=6 units, 301-350=6 units, 351-400=10 units, 401-500=12 units, if above 400 call md.         . metoCLOPramide (REGLAN) 10 MG tablet   Oral   Take 10 mg by mouth 3 (three) times daily.         . midodrine (PROAMATINE) 10 MG tablet   Oral   Take 1 tablet (10 mg total) by mouth 2 (two) times daily with a meal.         . pregabalin (LYRICA) 75 MG capsule   Oral   Take 1 capsule (75 mg total) by mouth 2 (two) times daily.   60 capsule   5   . saccharomyces boulardii (FLORASTOR) 250 MG capsule   Oral   Take 250 mg by mouth 2 (two) times daily.         . vancomycin (VANCOCIN) 1 GM/200ML SOLN   Intravenous   Inject 200 mLs (1,000 mg total) into the vein Every Tuesday,Thursday,and Saturday with dialysis. For 7 more days   4000 mL      . vitamin B-12 1000 MCG tablet   Oral   Take 1 tablet (1,000 mcg total) by mouth daily.         . vitamin C (ASCORBIC ACID) 500 MG tablet   Oral   Take 500 mg by mouth 2 (two) times daily.           BP 67/55  Pulse 79  Temp(Src) 99.4 F (37.4 C) (Rectal)  Resp 14  Ht 5\' 8"  (1.727 m)  Wt 180 lb (81.647 kg)  BMI 27.38 kg/m2  SpO2 98% Physical Exam  Nursing note and vitals reviewed.  68 year old male, resting comfortably and in no acute distress. Vital signs are significant for severe hypotension with blood pressure 67/55. Oxygen saturation is 98%, which is normal. Head is normocephalic and atraumatic. PERRLA, EOMI. Oropharynx is clear. Conjunctivae are pale. Neck is nontender and supple without adenopathy or JVD. Back is nontender and there is no CVA tenderness. Lungs are clear without rales, wheezes, or rhonchi. Chest is nontender. Heart has regular rate and rhythm without murmur. Abdomen is  soft, flat, nontender without masses or hepatosplenomegaly  and peristalsis is normoactive. Extremities have no cyanosis or edema, full range of motion is present. Skin is warm, mildly diaphoretic, and extremely pale. Neurologic: He is awake but seems somewhat obtunded. He does follow some commands but is not conversant. Cranial nerves are intact, there are no motor or sensory deficits.  ED Course  Procedures (including critical care time) Labs Review Results for orders placed during the hospital encounter of 04/02/2013  CBC WITH DIFFERENTIAL      Result Value Ref Range   WBC 14.3 (*) 4.0 - 10.5 K/uL   RBC 4.39  4.22 - 5.81 MIL/uL   Hemoglobin 11.7 (*) 13.0 - 17.0 g/dL   HCT 36.8 (*) 39.0 - 52.0 %   MCV 83.8  78.0 - 100.0 fL   MCH 26.7  26.0 - 34.0 pg   MCHC 31.8  30.0 - 36.0 g/dL   RDW 20.1 (*) 11.5 - 15.5 %   Platelets 274  150 - 400 K/uL   Neutrophils Relative % 78 (*) 43 - 77 %   Neutro Abs 11.1 (*) 1.7 - 7.7 K/uL   Lymphocytes Relative 14  12 - 46 %   Lymphs Abs 2.0  0.7 - 4.0 K/uL   Monocytes Relative 8  3 - 12 %   Monocytes Absolute 1.1 (*) 0.1 - 1.0 K/uL   Eosinophils Relative 0  0 - 5 %   Eosinophils Absolute 0.0  0.0 - 0.7 K/uL   Basophils Relative 0  0 - 1 %   Basophils Absolute 0.0  0.0 - 0.1 K/uL  COMPREHENSIVE METABOLIC PANEL      Result Value Ref Range   Sodium 135 (*) 137 - 147 mEq/L   Potassium 4.4  3.7 - 5.3 mEq/L   Chloride 91 (*) 96 - 112 mEq/L   CO2 19  19 - 32 mEq/L   Glucose, Bld 150 (*) 70 - 99 mg/dL   BUN 45 (*) 6 - 23 mg/dL   Creatinine, Ser 5.11 (*) 0.50 - 1.35 mg/dL   Calcium 9.4  8.4 - 10.5 mg/dL   Total Protein 6.4  6.0 - 8.3 g/dL   Albumin 2.7 (*) 3.5 - 5.2 g/dL   AST 12  0 - 37 U/L   ALT 6  0 - 53 U/L   Alkaline Phosphatase 95  39 - 117 U/L   Total Bilirubin 0.3  0.3 - 1.2 mg/dL   GFR calc non Af Amer 10 (*) >90 mL/min   GFR calc Af Amer 12 (*) >90 mL/min  URINALYSIS, ROUTINE W REFLEX MICROSCOPIC      Result Value Ref Range   Color, Urine YELLOW  YELLOW   APPearance TURBID (*) CLEAR   Specific  Gravity, Urine 1.020  1.005 - 1.030   pH 6.5  5.0 - 8.0   Glucose, UA NEGATIVE  NEGATIVE mg/dL   Hgb urine dipstick LARGE (*) NEGATIVE   Bilirubin Urine NEGATIVE  NEGATIVE   Ketones, ur TRACE (*) NEGATIVE mg/dL   Protein, ur 100 (*) NEGATIVE mg/dL   Urobilinogen, UA 0.2  0.0 - 1.0 mg/dL   Nitrite NEGATIVE  NEGATIVE   Leukocytes, UA LARGE (*) NEGATIVE  LACTIC ACID, PLASMA      Result Value Ref Range   Lactic Acid, Venous 6.3 (*) 0.5 - 2.2 mmol/L  TROPONIN I      Result Value Ref Range   Troponin I 0.57 (*) <0.30 ng/mL  URINE MICROSCOPIC-ADD ON  Result Value Ref Range   WBC, UA TOO NUMEROUS TO COUNT  <3 WBC/hpf   RBC / HPF TOO NUMEROUS TO COUNT  <3 RBC/hpf   Bacteria, UA MANY (*) RARE  CBG MONITORING, ED      Result Value Ref Range   Glucose-Capillary 157 (*) 70 - 99 mg/dL  POC OCCULT BLOOD, ED      Result Value Ref Range   Fecal Occult Bld POSITIVE (*) NEGATIVE  TYPE AND SCREEN      Result Value Ref Range   ABO/RH(D) A POS     Antibody Screen NEG     Sample Expiration 03/15/2013     Imaging Review Dg Chest Port 1 View  03/18/2013   CLINICAL DATA:  Hypertension  EXAM: PORTABLE CHEST - 1 VIEW  COMPARISON:  01/30/2013  FINDINGS: Chronic cardiopericardial enlargement. Stable positioning of right IJ dialysis catheter. No edema or consolidation. No effusion or pneumothorax. Severe bilateral glenohumeral osteoarthritis.  IMPRESSION: No active disease.   Electronically Signed   By: Jorje Guild M.D.   On: 04/09/2013 05:59   Images viewed by me.   EKG Interpretation   Date/Time:  Monday March 12 2013 05:45:43 EST Ventricular Rate:  76 PR Interval:    QRS Duration: 112 QT Interval:  426 QTC Calculation: 479 R Axis:   -90 Text Interpretation:  Atrial fibrillation with premature ventricular or  aberrantly conducted complexes and with ventricular escape complexes Left  axis deviation Low voltage QRS Incomplete right bundle branch block  Inferior infarct (cited on or before  04-Nov-2012) Anterolateral infarct  (cited on or before 06-Jan-2011) Abnormal ECG When compared with ECG of  01-Dec-2012 14:07, Incomplete right bundle branch block is now Present  Questionable change in initial forces of Anterolateral leads Confirmed by  [facility]  MD, Braylynn Ghan ([id number]) on 03/24/2013 7:57:23 AM     CRITICAL CARE Performed by: [id number] Total critical care time: 150 minutes Critical care time was exclusive of separately billable procedures and treating other patients. Critical care was necessary to treat or prevent imminent or life-threatening deterioration. Critical care was time spent personally by me on the following activities: development of treatment plan with patient and/or surrogate as well as nursing, discussions with consultants, evaluation of patient's response to treatment, examination of patient, obtaining history from patient or surrogate, ordering and performing treatments and interventions, ordering and review of laboratory studies, ordering and review of radiographic studies, pulse oximetry and re-evaluation of patient's condition.  MDM   Final diagnoses:  Sepsis  Urinary tract infection  Elevated troponin  A-fib  Altered mental status  End stage renal disease on dialysis    Weakness with severe hypotension. I suspect that he has severe of his anemia. Hemoglobin will be checked as well as electrolytes and other chemistry studies. He will be given aggressive IV hydration.  Hemoglobin has come back actually higher than baseline although stool is noted to be Hemoccult positive. Given that he does not make urine, in and out catheterization yielded grossly purulent urine. At this point it seems likely that his main problem is urosepsis. Troponin has come back mildly elevated and is felt to be demand ischemia but he is started on heparin for possible non-STEMI. He is also started on antibiotics for healthcare associated urinary tract infection. Blood pressure has not  come up with hydration. Metabolic panel shows a significantly elevated anion gap which is partly explained by other lactic acid and partly explained by end-stage renal disease. With hydration, his mentation improved  and is pallor improved although blood pressure has not improved. He was not felt to be a candidate to be admitted here because of elevated troponin and complexity of his case. Case is discussed with Dr. Alva Garnet of pulmonary critical care at [facility] who agrees to accept the patient requests blood pressure be improved prior to transfer. After 2 L of IV fluid, blood pressure is still below 80 systolic and he is started on norepinephrine drip. Family was present and was informed of findings and reason for transfer at all points in his care and they expressed understanding. They also agree that his mentation seems to be significantly improved.  Delora Fuel, MD [id number]

## 2013-03-12 NOTE — ED Notes (Signed)
Per family, pt c/o not feeling well.  Pt. Feeling clammy, sweaty.  Pt. C/o generalized weakness.

## 2013-03-12 NOTE — Progress Notes (Signed)
ANTICOAGULATION CONSULT NOTE - Initial Consult  Pharmacy Consult for Heparin Indication: chest pain/ACS  Allergies  Allergen Reactions  . Ace Inhibitors Other (See Comments)    cough  . Rocephin [Ceftriaxone Sodium] Hives  . Beta Adrenergic Blockers Rash   Patient Measurements: Height: 5\' 8"  (172.7 cm) Weight: 180 lb (81.647 kg) IBW/kg (Calculated) : 68.4 Heparin Dosing Weight: 75Kg  Vital Signs: Temp: 99.4 F (37.4 C) (03/02 0707) Temp src: Rectal (03/02 0707) BP: 75/59 mmHg (03/02 0700) Pulse Rate: 77 (03/02 0700)  Labs:  Recent Labs  2013-04-06 0543  HGB 11.7*  HCT 36.8*  PLT 274  CREATININE 5.11*  TROPONINI 0.57*   Estimated Creatinine Clearance: 13.4 ml/min (by C-G formula based on Cr of 5.11).  Medical History: Past Medical History  Diagnosis Date  . Arthritis   . Hypertension   . DJD (degenerative joint disease)   . DJD (degenerative joint disease), ankle and foot   . Arthritis   . Cellulitis   . Arthritis   . Chronic back pain   . Gout   . Dysrhythmia     afibrillation  . Atrial fibrillation   . Chronic kidney disease   . Colon cancer     2005  . 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 Charcot ankle     dr. Allene Pyo  . Bacteremia    Medications:   (Not in a hospital admission)  Assessment: 68yo male admitted with chest pain.  Asked to initiate Heparin for ACS.   Goal of Therapy:  Heparin level 0.3-0.7 units/ml Monitor platelets by anticoagulation protocol: Yes   Plan:  Heparin 4000 units IV now x 1 Heparin infusion at ~ 12 units/Kg/hr per Adj BW Heparin level in 6-8 hrs then daily CBC daily  [place] Crane, Zeynep Fantroy A 04-06-2013,7:38 AM

## 2013-03-12 NOTE — Progress Notes (Signed)
ANTICOAGULATION CONSULT NOTE - Follow-up Consult  Pharmacy Consult for Heparin Indication: chest pain/ACS  Allergies  Allergen Reactions  . Ace Inhibitors Other (See Comments)    cough  . Rocephin [Ceftriaxone Sodium] Hives  . Beta Adrenergic Blockers Rash   Patient Measurements: Height: 5\' 8"  (172.7 cm) Weight: 221 lb 5.5 oz (100.4 kg) IBW/kg (Calculated) : 68.4 Heparin Dosing Weight: 90Kg  Vital Signs: Temp: 97.5 F (36.4 C) (03/02 1315) Temp src: Oral (03/02 1315) BP: 109/81 mmHg (03/02 1530) Pulse Rate: 39 (03/02 1530)  Labs:  Recent Labs  04-09-13 0543 2013/04/09 1400 04-09-2013 1540  HGB 11.7*  --   --   HCT 36.8*  --   --   PLT 274  --   --   HEPARINUNFRC  --  <0.10*  --   CREATININE 5.11*  --   --   TROPONINI 0.57*  --  1.06*   Estimated Creatinine Clearance: 15.9 ml/min (by C-G formula based on Cr of 5.11).  Assessment: 68yo male on heparin for r/o ACS. Cardiology consulted and suspects elevated troponin due to demand ischemia from low perfusion. Per cards note earlier today, "if troponin stay about the same level, would d/c heparin." Troponin is up to 1.06 (trending up). Heparin level undetectable on 900 units/hr. No bleeding noted.  Goal of Therapy:  Heparin level 0.3-0.7 units/ml Monitor platelets by anticoagulation protocol: Yes   Plan:  1) Heparin 2000 units IV bolus now  2) Increase heparin gtt to 1200 units/hr 3) Heparin level in 8 hrs  4) Heparin level and CBC daily  Sherlon Handing, PharmD, BCPS Clinical pharmacist, pager [phone] 2013-04-09,4:54 PM

## 2013-03-12 NOTE — Progress Notes (Signed)
[facility] Progress Note Patient Name: Alex Petty DOB: 05/02/1945 MRN: [id number]  Date of Service  04/02/2013   HPI/Events of Note   RN unable to wean off levophed  eICU Interventions  Re order   Intervention Category Major Interventions: Hypotension - evaluation and management  Raylene Miyamoto. 03/15/2013, 6:58 PM

## 2013-03-12 NOTE — Consult Note (Signed)
Admit date: 03/26/2013 Referring Physician  Dr. Alva Garnet  Primary Cardiologist  Dr. Stanford Breed Reason for Consultation  elevated troponin  HPI: 68 year old man with end-stage renal disease who is admitted here for sepsis. He has been on antibiotics. He has been hypotensive requiring pressors. Troponin was checked and was found to be mildly elevated. Troponin done in 2014 was normal. Echocardiogram from 2014 showed normal left ventricular function.  The patient is currently quite dizzy and does not feel well. He denies chest pain. His breathing is not well. His pressors were just adjusted and his most recent blood pressure is in the [id number] systolic. During the course of my visit, his blood pressure increased back up to 89 and he felt better. He did not feel as dizzy. We spoke again about chest discomfort or shortness of breath. He denies any at the time. He has not had any significant coronary artery disease that he knows of.     PMH:   Past Medical History  Diagnosis Date  . Arthritis   . Hypertension   . DJD (degenerative joint disease)   . DJD (degenerative joint disease), ankle and foot   . Arthritis   . Cellulitis   . Arthritis   . Chronic back pain   . Gout   . Dysrhythmia     afibrillation  . Atrial fibrillation   . Chronic kidney disease   . Colon cancer     2005  . 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 Charcot ankle     dr. Allene Pyo  . Bacteremia      PSH:   Past Surgical History  Procedure Laterality Date  . Foot surgery      right foot-  . Colon surgery      forcolon cancer, 2005, Dr. Leanora Ivanoff in [place]  . Tonsillectomy    . Cystoscopy with urethral dilatation    . Back surgery      x2  . Cataract extraction w/phaco  01/14/2011    Procedure: CATARACT EXTRACTION PHACO AND INTRAOCULAR LENS PLACEMENT (IOC);  Surgeon: Tonny Branch;  Location: [facility];  Service: Ophthalmology;  Laterality: Left;  CDE=16.27  . Lymph node biopsy Right 07/11/2012    Procedure:  CERVICAL LYMPH NODE BIOPSY;  Surgeon: Scherry Ran, MD;  Location: [facility];  Service: General;  Laterality: Right;  end @ 1101  . Portacath placement Right 07/11/2012    Procedure: INSERTION PORT-A-CATH;  Surgeon: Scherry Ran, MD;  Location: [facility];  Service: General;  Laterality: Right;  . Port-a-cath removal Right 08/05/2012    Procedure: REMOVAL PORT-A-CATH;  Surgeon: Harl Bowie, MD;  Location: [facility];  Service: General;  Laterality: Right;  . Dialysis fistula creation      Allergies:  Ace inhibitors; Rocephin; and Beta adrenergic blockers Prior to Admit Meds:   Prescriptions prior to admission  Medication Sig Dispense Refill  . allopurinol (ZYLOPRIM) 100 MG tablet Take 100 mg by mouth daily.      . Amino Acids-Protein Hydrolys (FEEDING SUPPLEMENT, PRO-STAT SUGAR FREE 64,) LIQD Take 30 mLs by mouth daily.       . digoxin (LANOXIN) 0.125 MG tablet Take 0.125 mg by mouth every other day.      . famotidine (PEPCID) 20 MG tablet Take 1 tablet (20 mg total) by mouth 2 (two) times daily.      . ferrous fumarate (HEMOCYTE - 106 MG FE) 325 (106 FE) MG TABS tablet Take 1 tablet by mouth 2 (two) times daily.      Marland Kitchen  Folic Acid 0.8 MG CAPS Take 1 capsule by mouth daily.      Marland Kitchen HYDROcodone-acetaminophen (NORCO) 7.5-325 MG per tablet Take 1 tablet by mouth every 6 (six) hours as needed for moderate pain.      . midodrine (PROAMATINE) 10 MG tablet Take 1 tablet (10 mg total) by mouth 2 (two) times daily with a meal.      . pregabalin (LYRICA) 75 MG capsule Take 1 capsule (75 mg total) by mouth 2 (two) times daily.  60 capsule  5  . saccharomyces boulardii (FLORASTOR) 250 MG capsule Take 250 mg by mouth 2 (two) times daily.      . vitamin B-12 1000 MCG tablet Take 1 tablet (1,000 mcg total) by mouth daily.      . vitamin C (ASCORBIC ACID) 500 MG tablet Take 500 mg by mouth 2 (two) times daily.       . Calcium Carb-Cholecalciferol (CALCIUM 600 + D PO) Take 1 tablet by mouth daily.      .  insulin aspart (NOVOLOG) 100 UNIT/ML injection Inject 2-12 Units into the skin 3 (three) times daily with meals. If sugar is 150-200=2 units, 201-250=4 units, 251-300=6 units, 301-350=6 units, 351-400=10 units, 401-500=12 units, if above 400 call md.      . metoCLOPramide (REGLAN) 10 MG tablet Take 10 mg by mouth 3 (three) times daily.      . vancomycin (VANCOCIN) 1 GM/200ML SOLN Inject 200 mLs (1,000 mg total) into the vein Every Tuesday,Thursday,and Saturday with dialysis. For 7 more days  4000 mL     Fam HX:    Family History  Problem Relation Age of Onset  . Anesthesia problems Neg Hx   . Hypotension Neg Hx   . Malignant hyperthermia Neg Hx   . Pseudochol deficiency Neg Hx   . Breast cancer Mother   . Heart disease Mother    Social HX:    History   Social History  . Marital Status: Married    Spouse Name: N/A    Number of Children: N/A  . Years of Education: N/A   Occupational History  . Not on file.   Social History Main Topics  . Smoking status: Never Smoker   . Smokeless tobacco: Never Used  . Alcohol Use: No  . Drug Use: No  . Sexual Activity: Yes    Birth Control/ Protection: None     Comment: married   Other Topics Concern  . Not on file   Social History Narrative  . No narrative on file     ROS:  All 11 ROS were addressed and are negative except what is stated in the HPI  Physical Exam: Blood pressure 80/58, pulse 85, temperature 97.6 F (36.4 C), temperature source Rectal, resp. rate 18, height 5\' 8"  (1.727 m), weight 221 lb 5.5 oz (100.4 kg), SpO2 97.00%.   General: Well developed, well nourished, decreased responsiveness intermittently with low blood pressure Head:    Normal cephalic and atramatic  Lungs:  No wheezing Heart:   HRRR S1 S2  No JVD.  No abdominal bruits. No femoral bruits. Abdomen:  abdomen soft and non-tender  Extremities:  Ankle deformities bilaterally with significant ankle edema.  Unable to palpate pedal pulses Neuro: Alert once  blood pressure increased Psych:  Normal affect, responds appropriately once blood pressure increased    Labs:   Lab Results  Component Value Date   WBC 14.3* 03/23/2013   HGB 11.7* 03/22/2013   HCT 36.8* 04/07/2013  MCV 83.8 04/01/2013   PLT 274 03/19/2013    Recent Labs Lab 04/01/2013 0543  NA 135*  K 4.4  CL 91*  CO2 19  BUN 45*  CREATININE 5.11*  CALCIUM 9.4  PROT 6.4  BILITOT 0.3  ALKPHOS 95  ALT 6  AST 12  GLUCOSE 150*   No results found for this basename: PTT   Lab Results  Component Value Date   INR 1.17 11/05/2012   INR 1.16 11/04/2012   INR 1.08 10/25/2012   Lab Results  Component Value Date   CKTOTAL 18 08/15/2012   TROPONINI 0.57* 03/17/2013     No results found for this basename: CHOL   No results found for this basename: HDL   No results found for this basename: LDLCALC   No results found for this basename: TRIG   No results found for this basename: CHOLHDL   No results found for this basename: LDLDIRECT      Radiology:  Dg Chest Port 1 View  03/18/2013   CLINICAL DATA:  Hypertension  EXAM: PORTABLE CHEST - 1 VIEW  COMPARISON:  01/30/2013  FINDINGS: Chronic cardiopericardial enlargement. Stable positioning of right IJ dialysis catheter. No edema or consolidation. No effusion or pneumothorax. Severe bilateral glenohumeral osteoarthritis.  IMPRESSION: No active disease.   Electronically Signed   By: Jorje Guild M.D.   On: 03/15/2013 05:59    EKG:  Atrial fibrillation, mildly widened QRS  ASSESSMENT: Abnormal troponin in the setting of hypotension requiring pressors  PLAN:  This is likely related to demand ischemia from low perfusion. The patient is not having any symptoms which would suggest plaque rupture MI. Continue to cycle enzymes. Echocardiogram pending. If troponin stay at about the same level, would DC heparin. My suspicion is that this is not a plaque rupture MI.  Atrial fibrillation/hypertension: Levophed was stopped and changed to  Neo-Synephrine to help with arrhythmia. The patient became quite hypotensive during this switch which is when I was in the room. He is very symptomatic when his blood pressure drops. He is very sensitive to the low dosage of Levophed. I suggested putting him back on the levo fed if needed for his blood pressure. He did seem to improve by the end of the visit. Continue to watch closely.  Jettie Booze., MD  03/30/2013  1:21 PM

## 2013-03-12 NOTE — Progress Notes (Signed)
Montey Hora, PA was notified of troponin 1.06 result.

## 2013-03-12 NOTE — Progress Notes (Signed)
  Echocardiogram 2D Echocardiogram has been performed.  Alex Petty 03/31/2013, 2:40 PM

## 2013-03-12 NOTE — Procedures (Signed)
Central Venous Catheter Insertion Procedure Note Alex Petty [id number] Jun 06, 1945  Procedure: Insertion of Central Venous Catheter Indications: Assessment of intravascular volume, Drug and/or fluid administration and Frequent blood sampling  Procedure Details Consent: Risks of procedure as well as the alternatives and risks of each were explained to the (patient/caregiver).  Consent for procedure obtained. Time Out: Verified patient identification, verified procedure, site/side was marked, verified correct patient position, special equipment/implants available, medications/allergies/relevent history reviewed, required imaging and test results available.  Performed  Maximum sterile technique was used including antiseptics, cap, gloves, gown, hand hygiene, mask and sheet. Skin prep: Chlorhexidine; local anesthetic administered A antimicrobial bonded/coated triple lumen catheter was placed in the right femoral vein due to patient being a dialysis patient using the Seldinger technique.  Evaluation Blood flow good Complications: No apparent complications Patient did tolerate procedure well. CXR not needed.  Procedure performed under direct ultrasound guidance.  Montey Hora, PA - [facility] Pgr: [phone]  or [phone]   I was present for and supervised the entire procedure  Merton Border, MD ; [facility] service Mobile [phone].  After 5:30 PM or weekends, call [phone]

## 2013-03-12 NOTE — ED Notes (Signed)
Called [phone] for report, nurse to call me back.

## 2013-03-12 NOTE — Consult Note (Signed)
HPI: I was asked by Dr. Alva Garnet  to see Alex Petty who is a 68 y.o. male with ESRD followed by Dr. Hinda Lenis at [facility] in [place] who was transferred to [facility] for treatment of sepsis.  He last had dialysis Friday per his report.  Today he presented to [facility] with weakness, altered mental status and BP of 67/55.  He was transferred to [facility] ICU for further management.  He uses a right IJ PC for dialysis and has a left arm AV access that has never been used.  We are asked to see to provide dialysis support.  Past Medical History  Diagnosis Date  . Arthritis   . Hypertension   . DJD (degenerative joint disease)   . DJD (degenerative joint disease), ankle and foot   . Arthritis   . Cellulitis   . Arthritis   . Chronic back pain   . Gout   . Dysrhythmia     afibrillation  . Atrial fibrillation   . Chronic kidney disease   . Colon cancer     2005  . 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 Charcot ankle     dr. Allene Pyo  . Bacteremia    Past Surgical History  Procedure Laterality Date  . Foot surgery      right foot-  . Colon surgery      forcolon cancer, 2005, Dr. Leanora Ivanoff in [place]  . Tonsillectomy    . Cystoscopy with urethral dilatation    . Back surgery      x2  . Cataract extraction w/phaco  01/14/2011    Procedure: CATARACT EXTRACTION PHACO AND INTRAOCULAR LENS PLACEMENT (IOC);  Surgeon: Tonny Branch;  Location: [facility];  Service: Ophthalmology;  Laterality: Left;  CDE=16.27  . Lymph node biopsy Right 07/11/2012    Procedure: CERVICAL LYMPH NODE BIOPSY;  Surgeon: Scherry Ran, MD;  Location: [facility];  Service: General;  Laterality: Right;  end @ 1101  . Portacath placement Right 07/11/2012    Procedure: INSERTION PORT-A-CATH;  Surgeon: Scherry Ran, MD;  Location: [facility];  Service: General;  Laterality: Right;  . Port-a-cath removal Right 08/05/2012    Procedure: REMOVAL PORT-A-CATH;  Surgeon: Harl Bowie, MD;  Location: [facility];  Service:  General;  Laterality: Right;  . Dialysis fistula creation     Social History:  reports that he has never smoked. He has never used smokeless tobacco. He reports that he does not drink alcohol or use illicit drugs. Allergies:  Allergies  Allergen Reactions  . Ace Inhibitors Other (See Comments)    cough  . Rocephin [Ceftriaxone Sodium] Hives  . Beta Adrenergic Blockers Rash   Family History  Problem Relation Age of Onset  . Anesthesia problems Neg Hx   . Hypotension Neg Hx   . Malignant hyperthermia Neg Hx   . Pseudochol deficiency Neg Hx   . Breast cancer Mother   . Heart disease Mother     Medications:  Scheduled: . sodium chloride   Intravenous STAT  . ceFEPime (MAXIPIME) IV  2 g Intravenous Q12H  . vancomycin  1,000 mg Intravenous Q M,W,F-HD   ROS: not contributory Blood pressure 109/81, pulse 39, temperature 97.5 F (36.4 C), temperature source Oral, resp. rate 14, height _0  (1.727 m), weight 100.4 kg (221 lb 5.5 oz), SpO2 97.00%.  General appearance: alert and cooperative Head: Normocephalic, without obvious abnormality, atraumatic Resp: clear to auscultation bilaterally Chest wall: no tenderness   Dialysis catheter  in right chest Cardio: regular rate and rhythm, S1, S2 normal, no murmur, click, rub or gallop GI: soft, non-tender; bowel sounds normal; no masses,  no organomegaly Extremities: charcot ankles deviated laterally  AV access LUE dose not appear to be functioning Skin: Skin color, texture, turgor normal. No rashes or lesions Neurologic: Grossly normal Results for orders placed during the hospital encounter of 03/17/2013 (from the past 48 hour(s))  CBG MONITORING, ED     Status: Abnormal   Collection Time    04/08/2013  5:36 AM      Result Value Ref Range   Glucose-Capillary 157 (*) 70 - 99 mg/dL  CBC WITH DIFFERENTIAL     Status: Abnormal   Collection Time    03/24/2013  5:43 AM      Result Value Ref Range   WBC 14.3 (*) 4.0 - 10.5 K/uL   RBC 4.39  4.22  - 5.81 MIL/uL   Hemoglobin 11.7 (*) 13.0 - 17.0 g/dL   HCT 36.8 (*) 39.0 - 52.0 %   MCV 83.8  78.0 - 100.0 fL   MCH 26.7  26.0 - 34.0 pg   MCHC 31.8  30.0 - 36.0 g/dL   RDW 20.1 (*) 11.5 - 15.5 %   Platelets 274  150 - 400 K/uL   Neutrophils Relative % 78 (*) 43 - 77 %   Neutro Abs 11.1 (*) 1.7 - 7.7 K/uL   Lymphocytes Relative 14  12 - 46 %   Lymphs Abs 2.0  0.7 - 4.0 K/uL   Monocytes Relative 8  3 - 12 %   Monocytes Absolute 1.1 (*) 0.1 - 1.0 K/uL   Eosinophils Relative 0  0 - 5 %   Eosinophils Absolute 0.0  0.0 - 0.7 K/uL   Basophils Relative 0  0 - 1 %   Basophils Absolute 0.0  0.0 - 0.1 K/uL  COMPREHENSIVE METABOLIC PANEL     Status: Abnormal   Collection Time    03/18/2013  5:43 AM      Result Value Ref Range   Sodium 135 (*) 137 - 147 mEq/L   Potassium 4.4  3.7 - 5.3 mEq/L   Chloride 91 (*) 96 - 112 mEq/L   CO2 19  19 - 32 mEq/L   Glucose, Bld 150 (*) 70 - 99 mg/dL   BUN 45 (*) 6 - 23 mg/dL   Creatinine, Ser 5.11 (*) 0.50 - 1.35 mg/dL   Calcium 9.4  8.4 - 10.5 mg/dL   Total Protein 6.4  6.0 - 8.3 g/dL   Albumin 2.7 (*) 3.5 - 5.2 g/dL   AST 12  0 - 37 U/L   ALT 6  0 - 53 U/L   Alkaline Phosphatase 95  39 - 117 U/L   Total Bilirubin 0.3  0.3 - 1.2 mg/dL   GFR calc non Af Amer 10 (*) >90 mL/min   GFR calc Af Amer 12 (*) >90 mL/min   Comment: (NOTE)     The eGFR has been calculated using the CKD EPI equation.     This calculation has not been validated in all clinical situations.     eGFR's persistently <90 mL/min signify possible Chronic Kidney     Disease.  TROPONIN I     Status: Abnormal   Collection Time    04/02/2013  5:43 AM      Result Value Ref Range   Troponin I 0.57 (*) <0.30 ng/mL   Comment: CRITICAL RESULT CALLED TO, READ BACK  BY AND VERIFIED WITH:     WINN,T AT 6:35AM ON 04/09/2013 BY FESTERMAN,C                Due to the release kinetics of cTnI,     a negative result within the first hours     of the onset of symptoms does not rule out     myocardial  infarction with certainty.     If myocardial infarction is still suspected,     repeat the test at appropriate intervals.  LACTIC ACID, PLASMA     Status: Abnormal   Collection Time    03/24/2013  6:12 AM      Result Value Ref Range   Lactic Acid, Venous 6.3 (*) 0.5 - 2.2 mmol/L  TYPE AND SCREEN     Status: None   Collection Time    04/02/2013  6:12 AM      Result Value Ref Range   ABO/RH(D) A POS     Antibody Screen NEG     Sample Expiration 03/15/2013    URINALYSIS, ROUTINE W REFLEX MICROSCOPIC     Status: Abnormal   Collection Time    03/22/2013  6:55 AM      Result Value Ref Range   Color, Urine YELLOW  YELLOW   APPearance TURBID (*) CLEAR   Specific Gravity, Urine 1.020  1.005 - 1.030   pH 6.5  5.0 - 8.0   Glucose, UA NEGATIVE  NEGATIVE mg/dL   Hgb urine dipstick LARGE (*) NEGATIVE   Bilirubin Urine NEGATIVE  NEGATIVE   Ketones, ur TRACE (*) NEGATIVE mg/dL   Protein, ur 100 (*) NEGATIVE mg/dL   Urobilinogen, UA 0.2  0.0 - 1.0 mg/dL   Nitrite NEGATIVE  NEGATIVE   Leukocytes, UA LARGE (*) NEGATIVE  URINE MICROSCOPIC-ADD ON     Status: Abnormal   Collection Time    03/22/2013  6:55 AM      Result Value Ref Range   WBC, UA TOO NUMEROUS TO COUNT  <3 WBC/hpf   RBC / HPF TOO NUMEROUS TO COUNT  <3 RBC/hpf   Bacteria, UA MANY (*) RARE  POC OCCULT BLOOD, ED     Status: Abnormal   Collection Time    03/26/2013  7:10 AM      Result Value Ref Range   Fecal Occult Bld POSITIVE (*) NEGATIVE  CULTURE, BLOOD (ROUTINE X 2)     Status: None   Collection Time    04/08/2013  7:22 AM      Result Value Ref Range   Specimen Description Blood     Special Requests NONE     Culture NO GROWTH <24 HRS     Report Status PENDING    CULTURE, BLOOD (ROUTINE X 2)     Status: None   Collection Time    03/25/2013  7:22 AM      Result Value Ref Range   Specimen Description Blood     Special Requests NONE     Culture NO GROWTH <24 HRS     Report Status PENDING    GLUCOSE, CAPILLARY     Status: Abnormal    Collection Time    04/08/2013  9:27 AM      Result Value Ref Range   Glucose-Capillary 110 (*) 70 - 99 mg/dL   Comment 1 Notify RN    MRSA PCR SCREENING     Status: None   Collection Time    04/09/2013  9:45 AM  Result Value Ref Range   MRSA by PCR NEGATIVE  NEGATIVE   Comment:            The GeneXpert MRSA Assay (FDA     approved for NASAL specimens     only), is one component of a     comprehensive MRSA colonization     surveillance program. It is not     intended to diagnose MRSA     infection nor to guide or     monitor treatment for     MRSA infections.  PROCALCITONIN     Status: None   Collection Time    03/18/2013 11:10 AM      Result Value Ref Range   Procalcitonin 1.24     Comment:            Interpretation:     PCT > 0.5 ng/mL and <= 2 ng/mL:     Systemic infection (sepsis) is possible,     but other conditions are known to elevate     PCT as well.     (NOTE)             ICU PCT Algorithm               Non ICU PCT Algorithm        ----------------------------     ------------------------------             PCT < 0.25 ng/mL                 PCT < 0.1 ng/mL         Stopping of antibiotics            Stopping of antibiotics           strongly encouraged.               strongly encouraged.        ----------------------------     ------------------------------           PCT level decrease by               PCT < 0.25 ng/mL           >= 80% from peak PCT           OR PCT 0.25 - 0.5 ng/mL          Stopping of antibiotics                                                 encouraged.         Stopping of antibiotics               encouraged.        ----------------------------     ------------------------------           PCT level decrease by              PCT >= 0.25 ng/mL           < 80% from peak PCT            AND PCT >= 0.5 ng/mL            Continuing antibiotics  encouraged.           Continuing antibiotics                 encouraged.        ----------------------------     ------------------------------         PCT level increase compared          PCT > 0.5 ng/mL             with peak PCT AND              PCT >= 0.5 ng/mL             Escalation of antibiotics                                              strongly encouraged.          Escalation of antibiotics            strongly encouraged.  LACTIC ACID, PLASMA     Status: Abnormal   Collection Time    03/31/2013 11:45 AM      Result Value Ref Range   Lactic Acid, Venous 5.4 (*) 0.5 - 2.2 mmol/L   Dg Chest Port 1 View  04/01/2013   CLINICAL DATA:  Hypertension  EXAM: PORTABLE CHEST - 1 VIEW  COMPARISON:  01/30/2013  FINDINGS: Chronic cardiopericardial enlargement. Stable positioning of right IJ dialysis catheter. No edema or consolidation. No effusion or pneumothorax. Severe bilateral glenohumeral osteoarthritis.  IMPRESSION: No active disease.   Electronically Signed   By: Jorje Guild M.D.   On: 03/24/2013 05:59   Assessment:  1 ESRD 2 Hypotension 3 Probable sepsis, of concern is catheter infx  Plan: 1 Will hold on dialysis today to allow hemodynamic stability 2 Plan for HD Tuesday  David Rodriquez C 03/22/2013, 3:32 PM

## 2013-03-12 NOTE — Progress Notes (Signed)
INITIAL NUTRITION ASSESSMENT  Pt meets criteria for severe MALNUTRITION in the context of chronic illness as evidenced by <75% estimated energy intake with 9.4% weight loss in the past 5 months.  DOCUMENTATION CODES Per approved criteria  -Severe malnutrition in the context of chronic illness -Obesity Unspecified   INTERVENTION: - Diet advancement per MD - Recommend Nepro shake BID when diet advanced - Unit RD to continue to monitor   NUTRITION DIAGNOSIS: Inadequate oral intake related to inability to eat as evidenced by NPO.   Goal: Advance diet as tolerated to renal diet  Monitor:  Weights, labs, diet advancement  Reason for Assessment: Malnutrition screening tool   68 y.o. male  Admitting Dx: Shock  ASSESSMENT: Pt presented to [facility] ED with altered mental status and progressive weakness. Per him and his wife, he had been weak for the past few days; however, it progressed to a point where it got so bad that he had to go to the ED. Besides weakness, pt had been in his usual state of health. Prior to going to the ED, his wife reported that he was cool to the touch and significantly diaphoretic. He denies any chills, cough, chest pain, SOB, N/V, abdominal pain. Per ED notes, pt has chronic diarrhea that is dark colored due to him being on daily iron therapy. He does not produce any urine and goes to dialysis on M/W/F. In ED, pt was hypotensive (49'Q systolic) and had an elevated lactate (6.3). He received 3.5 L of IVF and was started on levophed. Pt was then [facility] under PCCM's care. Pt with history of chronic lymphocytic leukemia and end-stage renal disease on dialysis. Troponin mildly elevated, cardiology following, suspects not a plaque rupture MI. He is normally bedridden.   Met with pt who reports not having a real good appetite prior to admission, states he eats 2 meals/day of "whatever he wanted" in addition to daily Nepro shake. States he has been  avoiding salt. Noted past weight trend shows pt's weight down 23 pounds in the past 5 months. Pt reports weight loss due to fluid being pulled off from dialysis. Pt reports having 1 episode of diarrhea daily but he tries to stay hydrated to replace his fluid losses from diarrhea.   BUN/Cr elevated with low GFR   Height: Ht Readings from Last 1 Encounters:  04/08/2013 _0  (1.727 m)    Weight: Wt Readings from Last 1 Encounters:  03/26/2013 221 lb 5.5 oz (100.4 kg)    Ideal Body Weight: 154 lb   % Ideal Body Weight: 143%  Wt Readings from Last 10 Encounters:  03/23/2013 221 lb 5.5 oz (100.4 kg)  12/05/12 244 lb 14.9 oz (111.1 kg)  11/04/12 265 lb 14 oz (120.6 kg)  08/17/12 343 lb 0.6 oz (155.6 kg)  08/17/12 343 lb 0.6 oz (155.6 kg)  07/20/12 326 lb (147.873 kg)  07/19/12 326 lb (147.873 kg)  07/11/12 322 lb (146.058 kg)  07/11/12 322 lb (146.058 kg)  07/10/12 322 lb (146.058 kg)    Usual Body Weight: 244 lb   % Usual Body Weight: 90%  BMI:  Body mass index is 33.66 kg/(m^2). Class I obesity  Estimated Nutritional Needs: Kcal: 1750-2100 Protein: 85-105g Fluid: 1.7-2.1L/day  Skin: +1 right and left hand edema, generalized edema  Diet Order: NPO  EDUCATION NEEDS: -No education needs identified at this time   Intake/Output Summary (Last 24 hours) at 04/10/2013 1418 Last data filed at 03/20/2013 1230  Gross per 24 hour  Intake  261.3 ml  Output      0 ml  Net  261.3 ml    Last BM: 3/2  Labs:   Recent Labs Lab 03/27/2013 0543  NA 135*  K 4.4  CL 91*  CO2 19  BUN 45*  CREATININE 5.11*  CALCIUM 9.4  GLUCOSE 150*    CBG (last 3)   Recent Labs  03/17/2013 0536 04/01/2013 0927  GLUCAP 157* 110*    Scheduled Meds: . sodium chloride   Intravenous STAT  . ceFEPime (MAXIPIME) IV  2 g Intravenous Q12H  . vancomycin  1,000 mg Intravenous Q M,W,F-HD    Continuous Infusions: . sodium chloride    . heparin    . phenylephrine (NEO-SYNEPHRINE) Adult infusion  150 mcg/min (03/25/2013 1230)    Past Medical History  Diagnosis Date  . Arthritis   . Hypertension   . DJD (degenerative joint disease)   . DJD (degenerative joint disease), ankle and foot   . Arthritis   . Cellulitis   . Arthritis   . Chronic back pain   . Gout   . Dysrhythmia     afibrillation  . Atrial fibrillation   . Chronic kidney disease   . Colon cancer     2005  . 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 Charcot ankle     dr. Allene Pyo  . Bacteremia     Past Surgical History  Procedure Laterality Date  . Foot surgery      right foot-  . Colon surgery      forcolon cancer, 2005, Dr. Leanora Ivanoff in [place]  . Tonsillectomy    . Cystoscopy with urethral dilatation    . Back surgery      x2  . Cataract extraction w/phaco  01/14/2011    Procedure: CATARACT EXTRACTION PHACO AND INTRAOCULAR LENS PLACEMENT (IOC);  Surgeon: Tonny Branch;  Location: [facility];  Service: Ophthalmology;  Laterality: Left;  CDE=16.27  . Lymph node biopsy Right 07/11/2012    Procedure: CERVICAL LYMPH NODE BIOPSY;  Surgeon: Scherry Ran, MD;  Location: [facility];  Service: General;  Laterality: Right;  end @ 1101  . Portacath placement Right 07/11/2012    Procedure: INSERTION PORT-A-CATH;  Surgeon: Scherry Ran, MD;  Location: [facility];  Service: General;  Laterality: Right;  . Port-a-cath removal Right 08/05/2012    Procedure: REMOVAL PORT-A-CATH;  Surgeon: Harl Bowie, MD;  Location: [place];  Service: General;  Laterality: Right;  . Dialysis fistula creation      Mikey College MS, [place], [place] Pager [phone] After Hours Pager

## 2013-03-12 NOTE — ED Notes (Signed)
CRITICAL VALUE ALERT  Critical value received:  Troponin 0.57  Date of notification:  09-Apr-2013  Time of notification:  0634  Critical value read back:  yes  Nurse who received alert:  Kayleen Memos, RN  MD notified:  Dr. Roxanne Mins  Time:  7824

## 2013-03-12 NOTE — Care Management Note (Signed)
    Page 1 of 1   Apr 04, 2013     2:51:08 PM   CARE MANAGEMENT NOTE 04-04-2013  Patient:  Alex Petty, Alex Petty   Account Number:  [id number]  Date Initiated:  04/04/13  Documentation initiated by:  [facility]  Subjective/Objective Assessment:   Admitted with sepsis  ESRD patient with dialysis - M-W-F.  From [facility]     Action/Plan:   Anticipated DC Date:  03/19/2013   Anticipated DC Plan:  [place]  CM consult      Choice offered to / List presented to:             Status of service:  In process, will continue to follow Medicare Important Message given?   (If response is "NO", the following Medicare IM given date fields will be blank) Date Medicare IM given:   Date Additional Medicare IM given:    Discharge Disposition:    Per UR Regulation:  Reviewed for med. necessity/level of care/duration of stay  If discussed at [place] of Stay Meetings, dates discussed:    Comments:  Contact:  Kilroy,Corinda Spouse [phone]                 Thomas,Christy Daughter [phone]  04/04/13 2:45pm Luz Lex, RNBSN [phone] patient came from home with wife.  Prior notes from last admission note he was discharged to [facility].  Will follow for progression and discharge needs.

## 2013-03-13 ENCOUNTER — Inpatient Hospital Stay (HOSPITAL_COMMUNITY): Payer: Medicare Other

## 2013-03-13 DIAGNOSIS — A419 Sepsis, unspecified organism: Secondary | ICD-10-CM

## 2013-03-13 DIAGNOSIS — J9600 Acute respiratory failure, unspecified whether with hypoxia or hypercapnia: Secondary | ICD-10-CM

## 2013-03-13 DIAGNOSIS — M7989 Other specified soft tissue disorders: Secondary | ICD-10-CM

## 2013-03-13 LAB — CBC
HCT: 37.5 % — ABNORMAL LOW (ref 39.0–52.0)
HEMOGLOBIN: 11.6 g/dL — AB (ref 13.0–17.0)
MCH: 26.4 pg (ref 26.0–34.0)
MCHC: 30.9 g/dL (ref 30.0–36.0)
MCV: 85.4 fL (ref 78.0–100.0)
Platelets: 275 10*3/uL (ref 150–400)
RBC: 4.39 MIL/uL (ref 4.22–5.81)
RDW: 19.5 % — ABNORMAL HIGH (ref 11.5–15.5)
WBC: 29.7 10*3/uL — ABNORMAL HIGH (ref 4.0–10.5)

## 2013-03-13 LAB — HEPARIN LEVEL (UNFRACTIONATED)
HEPARIN UNFRACTIONATED: 0.23 [IU]/mL — AB (ref 0.30–0.70)
Heparin Unfractionated: 0.42 IU/mL (ref 0.30–0.70)

## 2013-03-13 LAB — BASIC METABOLIC PANEL
BUN: 52 mg/dL — AB (ref 6–23)
CO2: 8 mEq/L — CL (ref 19–32)
Calcium: 8.9 mg/dL (ref 8.4–10.5)
Chloride: 90 mEq/L — ABNORMAL LOW (ref 96–112)
Creatinine, Ser: 5.3 mg/dL — ABNORMAL HIGH (ref 0.50–1.35)
GFR calc Af Amer: 12 mL/min — ABNORMAL LOW (ref >90)
GFR calc non Af Amer: 10 mL/min — ABNORMAL LOW (ref >90)
GLUCOSE: 159 mg/dL — AB (ref 70–99)
Potassium: 5.3 mEq/L (ref 3.7–5.3)
Sodium: 132 mEq/L — ABNORMAL LOW (ref 137–147)

## 2013-03-13 LAB — POCT I-STAT 3, ART BLOOD GAS (G3+)
ACID-BASE DEFICIT: 26 mmol/L — AB (ref 0.0–2.0)
Bicarbonate: 4.9 mEq/L — ABNORMAL LOW (ref 20.0–24.0)
O2 SAT: 99 %
TCO2: 6 mmol/L (ref 0–100)
pCO2 arterial: 20.6 mmHg — ABNORMAL LOW (ref 35.0–45.0)
pH, Arterial: 6.979 — CL (ref 7.350–7.450)
pO2, Arterial: 225 mmHg — ABNORMAL HIGH (ref 80.0–100.0)

## 2013-03-13 LAB — TROPONIN I: Troponin I: 1.77 ng/mL (ref <0.30)

## 2013-03-13 LAB — URINE CULTURE
COLONY COUNT: NO GROWTH
CULTURE: NO GROWTH

## 2013-03-13 LAB — PROCALCITONIN

## 2013-03-13 LAB — HEPATITIS B SURFACE ANTIGEN: Hepatitis B Surface Ag: NEGATIVE

## 2013-03-13 LAB — CORTISOL: Cortisol, Plasma: 27.6 ug/dL

## 2013-03-13 LAB — GLUCOSE, CAPILLARY: Glucose-Capillary: 158 mg/dL — ABNORMAL HIGH (ref 70–99)

## 2013-03-13 LAB — PHOSPHORUS

## 2013-03-13 LAB — MAGNESIUM

## 2013-03-13 LAB — LACTIC ACID, PLASMA: LACTIC ACID, VENOUS: 12.6 mmol/L — AB (ref 0.5–2.2)

## 2013-03-13 MED ORDER — NOREPINEPHRINE BITARTRATE 1 MG/ML IJ SOLN
2.0000 ug/min | INTRAMUSCULAR | Status: DC
  Administered 2013-03-13: 6 ug/min via INTRAVENOUS
  Filled 2013-03-13: qty 16

## 2013-03-13 MED ORDER — PRISMASOL BGK 0/2.5 32-2.5 MEQ/L IV SOLN
INTRAVENOUS | Status: DC
  Filled 2013-03-13 (×9): qty 5000

## 2013-03-13 MED ORDER — VASOPRESSIN 20 UNIT/ML IJ SOLN
0.0300 [IU]/min | INTRAVENOUS | Status: DC
  Administered 2013-03-13: 0.03 [IU]/min via INTRAVENOUS
  Filled 2013-03-13: qty 2.5

## 2013-03-13 MED ORDER — NITROGLYCERIN 0.4 MG SL SUBL
SUBLINGUAL_TABLET | SUBLINGUAL | Status: AC
  Filled 2013-03-13: qty 1

## 2013-03-13 MED ORDER — MIDAZOLAM HCL 2 MG/2ML IJ SOLN
2.0000 mg | Freq: Once | INTRAMUSCULAR | Status: AC
  Administered 2013-03-13: 2 mg via INTRAVENOUS

## 2013-03-13 MED ORDER — HEPARIN (PORCINE) 2000 UNITS/L FOR CRRT
INTRAVENOUS_CENTRAL | Status: DC | PRN
  Filled 2013-03-13: qty 1000

## 2013-03-13 MED ORDER — SODIUM BICARBONATE 8.4 % IV SOLN
INTRAVENOUS | Status: DC
  Filled 2013-03-13 (×6): qty 150

## 2013-03-13 MED ORDER — PANTOPRAZOLE SODIUM 40 MG IV SOLR
40.0000 mg | Freq: Every day | INTRAVENOUS | Status: DC
Start: 2013-03-13 — End: 2013-03-13
  Filled 2013-03-13: qty 40

## 2013-03-13 MED ORDER — SODIUM BICARBONATE 8.4 % IV SOLN
INTRAVENOUS | Status: AC
  Administered 2013-03-13: 100 meq
  Filled 2013-03-13: qty 100

## 2013-03-13 MED ORDER — BIOTENE DRY MOUTH MT LIQD: 1.0000 "application " | Freq: Four times a day (QID) | OROMUCOSAL | Status: DC

## 2013-03-13 MED ORDER — SODIUM BICARBONATE 8.4 % IV SOLN
50.0000 meq | Freq: Once | INTRAVENOUS | Status: AC
  Administered 2013-03-13: 50 meq via INTRAVENOUS
  Filled 2013-03-13: qty 50

## 2013-03-13 MED ORDER — NITROGLYCERIN 0.4 MG SL SUBL
0.4000 mg | SUBLINGUAL_TABLET | SUBLINGUAL | Status: DC | PRN
  Administered 2013-03-13 (×3): 0.4 mg via SUBLINGUAL

## 2013-03-13 MED ORDER — INSULIN ASPART 100 UNIT/ML ~~LOC~~ SOLN
0.0000 [IU] | SUBCUTANEOUS | Status: DC
  Administered 2013-03-13: 3 [IU] via SUBCUTANEOUS

## 2013-03-13 MED ORDER — SODIUM BICARBONATE 8.4 % IV SOLN
INTRAVENOUS | Status: AC
  Filled 2013-03-13: qty 50

## 2013-03-13 MED ORDER — MORPHINE SULFATE 2 MG/ML IJ SOLN
2.0000 mg | INTRAMUSCULAR | Status: DC | PRN
  Administered 2013-03-13: 2 mg via INTRAVENOUS

## 2013-03-13 MED ORDER — FENTANYL CITRATE 0.05 MG/ML IJ SOLN
100.0000 ug | Freq: Once | INTRAMUSCULAR | Status: AC
  Administered 2013-03-13: 100 ug via INTRAVENOUS

## 2013-03-13 MED ORDER — VANCOMYCIN HCL IN DEXTROSE 1-5 GM/200ML-% IV SOLN
1000.0000 mg | INTRAVENOUS | Status: DC
  Filled 2013-03-13: qty 200

## 2013-03-13 MED ORDER — SODIUM CHLORIDE 0.9 % IV SOLN
25.0000 ug/h | INTRAVENOUS | Status: DC
  Administered 2013-03-13: 25 ug/h via INTRAVENOUS
  Filled 2013-03-13: qty 50

## 2013-03-13 MED ORDER — FENTANYL CITRATE 0.05 MG/ML IJ SOLN
INTRAMUSCULAR | Status: AC
  Administered 2013-03-13: 100 ug
  Filled 2013-03-13: qty 4

## 2013-03-13 MED ORDER — HEPARIN SODIUM (PORCINE) 1000 UNIT/ML DIALYSIS
1000.0000 [IU] | INTRAMUSCULAR | Status: DC | PRN
  Filled 2013-03-13: qty 6

## 2013-03-13 MED ORDER — ETOMIDATE 2 MG/ML IV SOLN
INTRAVENOUS | Status: AC
  Administered 2013-03-13: 20 mg
  Filled 2013-03-13: qty 10

## 2013-03-13 MED ORDER — SODIUM BICARBONATE 8.4 % IV SOLN
INTRAVENOUS | Status: DC
  Filled 2013-03-13 (×7): qty 150

## 2013-03-13 MED ORDER — SODIUM CHLORIDE 0.9 % IV BOLUS (SEPSIS)
500.0000 mL | Freq: Once | INTRAVENOUS | Status: AC
  Administered 2013-03-13: 500 mL via INTRAVENOUS

## 2013-03-13 MED ORDER — SODIUM BICARBONATE 8.4 % IV SOLN
INTRAVENOUS | Status: DC
  Filled 2013-03-13 (×5): qty 150

## 2013-03-13 MED ORDER — MIDAZOLAM HCL 2 MG/2ML IJ SOLN
INTRAMUSCULAR | Status: AC
  Administered 2013-03-13: 2 mg
  Filled 2013-03-13: qty 4

## 2013-03-13 MED ORDER — MORPHINE SULFATE 2 MG/ML IJ SOLN
INTRAMUSCULAR | Status: AC
  Filled 2013-03-13: qty 1

## 2013-03-13 MED ORDER — SODIUM CHLORIDE 0.9 % IV SOLN
100.0000 mg | Freq: Every day | INTRAVENOUS | Status: DC
  Administered 2013-03-13: 100 mg via INTRAVENOUS
  Filled 2013-03-13: qty 100

## 2013-03-13 MED ORDER — VECURONIUM BROMIDE 10 MG IV SOLR
INTRAVENOUS | Status: AC
  Filled 2013-03-13: qty 10

## 2013-03-13 MED ORDER — CHLORHEXIDINE GLUCONATE 0.12 % MT SOLN
15.0000 mL | Freq: Two times a day (BID) | OROMUCOSAL | Status: DC
  Administered 2013-03-13: 15 mL via OROMUCOSAL
  Filled 2013-03-13: qty 15

## 2013-03-16 NOTE — Discharge Summary (Signed)
Name: Alex Petty MRN: [id number] DOB: 1945/10/31  PCCM DEATH NOTE  Time of death:  11:30 AM 23-Mar-2013  Cause of death: Acute pulmonary embolism  Discharge diagnoses: Acute pulmonary embolism Acute respiratory failure Right ventricular failure Pulmonary hypertension Shock  NSTEMI ESRD Metabolic acidosis Acute encephalopathy  Brief hospital course: 68 yo ESRD with presented to [facility] with weakness, acute encephalopathy, and hypotension. Transferred to [facility] for further management.  Course was complicated by suspected acute pulmonary embolism,  acute respiratory failure shock and cardiac arrest.  Per family request, no cardiopulmonary resuscitation was attempted.  Doree Fudge, MD Pulmonary and [place] Pager: [phone]

## 2013-03-17 LAB — CULTURE, BLOOD (ROUTINE X 2)
Culture: NO GROWTH
Culture: NO GROWTH

## 2013-04-11 NOTE — Progress Notes (Signed)
Chaplain responded to page.  However, pt and family already had pastoral presence with a family pastor.  Though they appreciated the visit from chaplain services.

## 2013-04-11 NOTE — Procedures (Signed)
Arterial Catheter Insertion Procedure Note Alex Petty [id number] Feb 06, 1945  Procedure: Insertion of Arterial Catheter  Indications: Blood pressure monitoring and Frequent blood sampling  Procedure Details Consent: Unable to obtain consent because of emergent medical necessity. Time Out: Verified patient identification, verified procedure, site/side was marked, verified correct patient position, special equipment/implants available, medications/allergies/relevent history reviewed, required imaging and test results available.  Performed  Maximum sterile technique was used including antiseptics, cap, gloves, hand hygiene, mask and sheet. Skin prep: Chlorhexidine; local anesthetic administered 20 gauge catheter was inserted into right femoral artery using the Seldinger technique.  Ultrasound used for realtime vessel cannulation.   Evaluation Blood flow good; BP tracing good. Complications: No apparent complications.  Georgann Housekeeper, ACNP [place] [facility]/Critical Care Pager [phone] or [phone]  Supervised and present through the entire procedure.  Doree Fudge, MD Pulmonary and [place] Pager: [phone]

## 2013-04-11 NOTE — Progress Notes (Signed)
ANTICOAGULATION CONSULT NOTE - Follow Up Consult  Pharmacy Consult for heparin Indication: chest pain/ACS  Labs:  Recent Labs  04/09/2013 0543 03/28/2013 1400 03/21/2013 1540 03/18/2013 0100  HGB 11.7*  --   --   --   HCT 36.8*  --   --   --   PLT 274  --   --   --   HEPARINUNFRC  --  <0.10*  --  0.23*  CREATININE 5.11*  --   --   --   TROPONINI 0.57*  --  1.06*  --     Assessment: 68yo male remains subtherapeutic on heparin after rate increase though now approaching goal.  Goal of Therapy:  Heparin level 0.3-0.7 units/ml   Plan:  Will increase heparin gtt by 2 units/kg/hr to 1400 units/hr and check level in [place], PharmD, BCPS  04/01/2013,1:43 AM

## 2013-04-11 NOTE — Progress Notes (Signed)
VASCULAR LAB PRELIMINARY  PRELIMINARY  PRELIMINARY  PRELIMINARY  1.Bilateral lower extremity venous duplex and 2. left upper extremity duplex completed.    Preliminary report:   1.  Right:  No evidence of DVT, superficial thrombosis, or Baker's cyst.  Left:  Hyper acute, mobile  DVT noted in the CFV.  No evidence of superficial thrombosis.  No Baker's cyst.   2.  Left:  No evidence of DVT or superficial thrombosis.    Alex Petty, RVT 03/18/2013, 10:28 AM

## 2013-04-11 NOTE — Progress Notes (Signed)
Assessment:  1 ESRD  2 Hypotension  3 Acidosis, met-severe  4 Probable sepsis  Plan:  1 Will hold on intermittent hemodialysis today to allow hemodynamic stability  2 Will do CVVHD 3 Volume expand  Subjective: Interval History: Worse hypotension and acidosis  Objective: Vital signs in last 24 hours: Temp:  [97.4 F (36.3 C)-97.8 F (36.6 C)] 97.8 F (36.6 C) (03/03 0400) Pulse Rate:  [30-93] 38 (03/02 2200) Resp:  [10-35] 18 (03/03 0700) BP: (55-127)/(33-92) 66/47 mmHg (03/03 0700) SpO2:  [87 %-99 %] 97 % (03/02 2200) FiO2 (%):  [50 %-100 %] 50 % (03/03 0900) Weight:  [100.2 kg (220 lb 14.4 oz)-100.4 kg (221 lb 5.5 oz)] 100.2 kg (220 lb 14.4 oz) (03/03 0155) Weight change: 18.753 kg (41 lb 5.5 oz)  Intake/Output from previous day: 03/02 0701 - 03/03 0700 In: 1601.4 [I.V.:1301.4; IV Piggyback:300] Out: -  Intake/Output this shift: Total I/O In: 69.9 [I.V.:69.9] Out: -   General appearance: sedated not responsive  Lab Results:  Recent Labs  2013-04-03 0500 2013/04/03 0733  WBC PATIENT IDENTIFICATION ERROR. PLEASE DISREGARD RESULTS. ACCOUNT WILL BE CREDITED. 29.7*  HGB PATIENT IDENTIFICATION ERROR. PLEASE DISREGARD RESULTS. ACCOUNT WILL BE CREDITED. 11.6*  HCT PATIENT IDENTIFICATION ERROR. PLEASE DISREGARD RESULTS. ACCOUNT WILL BE CREDITED. 37.5*  PLT PATIENT IDENTIFICATION ERROR. PLEASE DISREGARD RESULTS. ACCOUNT WILL BE CREDITED. 275   BMET:  Recent Labs  2013-04-03 0500 2013-04-03 0733  NA PATIENT IDENTIFICATION ERROR. PLEASE DISREGARD RESULTS. ACCOUNT WILL BE CREDITED. 132*  K PATIENT IDENTIFICATION ERROR. PLEASE DISREGARD RESULTS. ACCOUNT WILL BE CREDITED. 5.3  CL PATIENT IDENTIFICATION ERROR. PLEASE DISREGARD RESULTS. ACCOUNT WILL BE CREDITED. 90*  CO2 PATIENT IDENTIFICATION ERROR. PLEASE DISREGARD RESULTS. ACCOUNT WILL BE CREDITED. 8*  GLUCOSE PATIENT IDENTIFICATION ERROR. PLEASE DISREGARD RESULTS. ACCOUNT WILL BE CREDITED. 159*  BUN PATIENT IDENTIFICATION  ERROR. PLEASE DISREGARD RESULTS. ACCOUNT WILL BE CREDITED. 52*  CREATININE PATIENT IDENTIFICATION ERROR. PLEASE DISREGARD RESULTS. ACCOUNT WILL BE CREDITED. 5.30*  CALCIUM PATIENT IDENTIFICATION ERROR. PLEASE DISREGARD RESULTS. ACCOUNT WILL BE CREDITED. 8.9   No results found for this basename: PTH,  in the last 72 hours Iron Studies: No results found for this basename: IRON, TIBC, TRANSFERRIN, FERRITIN,  in the last 72 hours Studies/Results: Dg Chest Port 1 View  04/03/13   CLINICAL DATA:  Respiratory distress  EXAM: PORTABLE CHEST - 1 VIEW  COMPARISON:  03/29/2013  FINDINGS: Right jugular central venous catheter tip at the cavoatrial junction unchanged.  Cardiac enlargement. Normal vascularity. Negative for edema effusion or infiltrate.  IMPRESSION: No active disease.   Electronically Signed   By: Franchot Gallo M.D.   On: Apr 03, 2013 08:26   Dg Chest Port 1 View  03/17/2013   CLINICAL DATA:  Hypertension  EXAM: PORTABLE CHEST - 1 VIEW  COMPARISON:  01/30/2013  FINDINGS: Chronic cardiopericardial enlargement. Stable positioning of right IJ dialysis catheter. No edema or consolidation. No effusion or pneumothorax. Severe bilateral glenohumeral osteoarthritis.  IMPRESSION: No active disease.   Electronically Signed   By: Jorje Guild M.D.   On: 04/05/2013 05:59   Scheduled: . antiseptic oral rinse  1 application Mouth Rinse QID  . ceFEPime (MAXIPIME) IV  2 g Intravenous Q12H  . chlorhexidine  15 mL Mouth/Throat BID  . insulin aspart  0-15 Units Subcutaneous 6 times per day  . micafungin [facility]) IV  100 mg Intravenous Daily  . pantoprazole (PROTONIX) IV  40 mg Intravenous QHS  . sodium bicarbonate      . vancomycin  1,000  mg Intravenous Q M,W,F-HD  . vecuronium         LOS: 1 day   Anayansi Rundquist C 04-01-2013,9:28 AM

## 2013-04-11 NOTE — Progress Notes (Signed)
Pt without BP, pulse or respiration effort aystole on monitor, pupils non responsive Family at bedside, CCM aware

## 2013-04-11 NOTE — Progress Notes (Signed)
Goals of care discussion with family at bedside >>> DNR.

## 2013-04-11 NOTE — Progress Notes (Signed)
PULMONARY / CRITICAL CARE MEDICINE  Name: Alex Petty MRN: [id number] DOB: 1945/06/27    ADMISSION DATE:  04/07/2013 CONSULTATION DATE:  03/21/2013  REFERRING MD :  [facility] EDP  PRIMARY SERVICE: PCCM  CHIEF COMPLAINT:  Weakness, AMS  BRIEF PATIENT DESCRIPTION: 68 yo ESRD with presented to [facility] with weakness, acute encephalopathy, and hypotension.  Transferred to [facility] for further management.  SIGNIFICANT EVENTS / STUDIES:  3/2  TTE >>> EF 60%, severely dilated RV / RA, PAP = 53 torr, marked RV failure 3/2  L UE venous Doppler >>>  3/2  BL LE venous Doppler >>>  LINES / TUBES: R IJ HD cath (tunelled) 10/17/12 >>>  R fem CVL 3/2 >>> R fem A line 3/3 >>> OETT  3/3 >>> OGT  3/3 >>>  CULTURES: Blood 3/2 >>  Urine 3/2 >>> neg MRSA 3/2 >>> neg  ANTIBIOTICS: Levaquin 3/2 x 1 Aztreonam 3/2 x 1 Vanc 3/2 >>>  Cefepime 3/2 >>>  Micafungin 3/2 >>>  SUBJECTIVE:  Worsen encephalopathy / hypotension overnight.  Severe metabolic acidosis. Intubated.  VITAL SIGNS: Temp:  [97.4 F (36.3 C)-97.8 F (36.6 C)] 97.8 F (36.6 C) (03/03 0400) Pulse Rate:  [30-93] 38 (03/02 2200) Resp:  [10-35] 18 (03/03 0700) BP: (55-127)/(33-92) 66/47 mmHg (03/03 0700) SpO2:  [87 %-99 %] 97 % (03/02 2200) FiO2 (%):  [100 %] 100 % (03/03 0745) Weight:  [100.2 kg (220 lb 14.4 oz)-100.4 kg (221 lb 5.5 oz)] 100.2 kg (220 lb 14.4 oz) (03/03 0155) HEMODYNAMICS:   VENTILATOR SETTINGS: Vent Mode:  [-]  FiO2 (%):  [100 %] 100 %  INTAKE / OUTPUT: Intake/Output     03/02 0701 - 03/03 0700 03/03 0701 - 03/04 0700   I.V. (mL/kg) 1277.4 (12.7)    IV Piggyback 300    Total Intake(mL/kg) 1577.4 (15.7)    Net +1577.4          Stool Occurrence 1 x     PHYSICAL EXAMINATION: General:  Appears acutely ill, pale, mechanically ventilated, synchronous Neuro:  Sedated, nonfocal, cough / gag diminished HEENT:  PERRL, OETT / OGT Cardiovascular:  Tachycardic, ectopy, poor peripheral pulses Lungs:  Bilateral diminished  air entry, no w/r/r Abdomen:  Soft, nontender, bowel sounds diminished Musculoskeletal:  Moves all extremities, no edema Skin:  Intact  LABS:  CBC  Recent Labs Lab 03/11/2013 0543 03/11/2013 0500 04/05/2013 0733  WBC 14.3* 7.2 29.7*  HGB 11.7* 10.2* 11.6*  HCT 36.8* 30.7* 37.5*  PLT 274 103* 275   Coag's No results found for this basename: APTT, INR,  in the last 168 hours  BMET  Recent Labs Lab 03/26/2013 0543 04/02/2013 0500 03/15/2013 0733  NA 135* 145 132*  K 4.4 3.2* 5.3  CL 91* 114* 90*  CO2 19 15* 8*  BUN 45* 12 52*  CREATININE 5.11* 1.02 5.30*  GLUCOSE 150* 90 159*   Electrolytes  Recent Labs Lab 03/29/2013 0543 04/04/2013 0500 03/21/2013 0733  CALCIUM 9.4 7.2* 8.9  MG  --  1.5  --   PHOS  --  2.1*  --    Sepsis Markers  Recent Labs Lab 04/06/2013 1110 04/04/2013 1145 03/16/2013 1452 03/15/2013 1852 04/07/2013 0500  LATICACIDVEN  --  5.4* 5.0* 4.7*  --   PROCALCITON 1.24  --   --   --  0.50   Liver Enzymes  Recent Labs Lab 03/22/2013 0543  AST 12  ALT 6  ALKPHOS 95  BILITOT 0.3  ALBUMIN 2.7*   Cardiac Enzymes  Recent Labs Lab 03/21/2013 1540 03/20/2013 0500 03/16/2013 0733  TROPONINI 1.06* <0.30 1.77*   Glucose  Recent Labs Lab Mar 21, 2013 0536 2013-03-21 0927  GLUCAP 157* 110*   Imaging Dg Chest Port 1 View  03/16/2013   CLINICAL DATA:  Respiratory distress  EXAM: PORTABLE CHEST - 1 VIEW  COMPARISON:  03-21-13  FINDINGS: Right jugular central venous catheter tip at the cavoatrial junction unchanged.  Cardiac enlargement. Normal vascularity. Negative for edema effusion or infiltrate.  IMPRESSION: No active disease.   Electronically Signed   By: Franchot Gallo M.D.   On: 04/09/2013 08:26   Dg Chest Port 1 View  03/21/2013   CLINICAL DATA:  Hypertension  EXAM: PORTABLE CHEST - 1 VIEW  COMPARISON:  01/30/2013  FINDINGS: Chronic cardiopericardial enlargement. Stable positioning of right IJ dialysis catheter. No edema or consolidation. No effusion or  pneumothorax. Severe bilateral glenohumeral osteoarthritis.  IMPRESSION: No active disease.   Electronically Signed   By: Jorje Guild M.D.   On: 21-Mar-2013 05:59   ASSESSMENT / PLAN:  PULMONARY A: Acute respiratory failure in setting of sepsis / severe acidosis Must rule out pulmonary embolus given severe RV failure P:   Goal pH>7.30, SpO2>92 Continuous mechanical support VAP bundle Daily SBT Trend ABG/CXR  Chest CTA when stable Start empirical Heparin Venous Doppler LE BL  CARDIOVASCULAR A:  Pulmonary hypertension with severe RV failure Shock ( septic vs cardiogenic vs tamponade ) Severe ventricular ectomy NSTEMI vs demand ischemia Chronic AF, historically intolerant to full anticoagulation Chronic hypotension (on Midodrine) Concern for L IJ clot noted during [place] prior to aborted CVL attempt P:  Cardiology consulted Goal MAP > 60 D/c Neo-Synephrine Levophed gtt Vasopressin gtt  Milrinone would be ideal given pulmonary hypertension but limited by ectopy   RENAL A:  ESRD Severe increased gap metabolic acidosis, likely multifactorial  P:   Renal following Trend BMP Change from HD to CVVHD Bicarb 50 x 4  GASTROINTESTINAL A:  Nutrition GI PX P:   NPO Protonix TF after pressor requirements decreased  HEMATOLOGIC A:  Therapeutic anticoagulation for presumed PE P:  Trend CBC Heparin per pharmacy  INFECTIOUS A:  Septic shock, unclear source UTI P:   Cx / abx as above Add antifungal coverage  ENDOCRINE A: Adrenal insufficiency ruled out P:   SSI  NEUROLOGIC/RHEUM A:  Acute encephalopathy Chronic pain P:   Goal RASS 0 to -1 Fentanyl gtt Hold Lyrica  I have personally obtained history, examined patient, evaluated and interpreted laboratory and imaging results, reviewed medical records, formulated assessment / plan and placed orders.  CRITICAL CARE:  The patient is critically ill with multiple organ systems failure and requires high  complexity decision making for assessment and support, frequent evaluation and titration of therapies, application of advanced monitoring technologies and extensive interpretation of multiple databases. Critical Care Time devoted to patient care services described in this note is 60 minutes.   Doree Fudge, MD Pulmonary and [place] Pager: [phone]  04/05/2013, 9:13 AM

## 2013-04-11 NOTE — Procedures (Signed)
Name: Alex Petty MRN: [id number] DOB: Jul 19, 1945   PROCEDURE NOTE  Procedure:  Endotracheal intubation.  Indication:  Acute respiratory failure  Consent:  Consent was implied due to the emergency nature of the procedure.  Anesthesia:  A total of 10 mg of Etomidate was given intravenously.  Procedure summary:  Appropriate equipment was assembled. The patient was identified as Alex Petty and safety timeout was performed. The patient was placed supine, with head in sniffing position. After adequate level of anesthesia was achieved, a mac 3 blade was inserted into the oropharynx and the vocal cords were visualized. A 8.0 endotracheal tube was inserted without difficulty and visualized going through the vocal cords. The stylette was removed and cuff inflated. Colorimetric change was noted on the CO2 meter. Breath sounds were heard over both lung fields equally. ETT was secured at 24 cm lip line.  Post procedure chest xray was ordered.  Complications:  No immediate complications were noted.  Hemodynamic parameters and oxygenation remained stable throughout the procedure.    Penne Lash, M.D. Pulmonary and [place] Pager: [phone]  03/12/2013, 9:16 AM

## 2013-04-11 NOTE — Progress Notes (Signed)
ANTICOAGULATION CONSULT NOTE - Follow-up Consult  Pharmacy Consult for Heparin Indication: chest pain/ACS  Allergies  Allergen Reactions  . Ace Inhibitors Other (See Comments)    cough  . Rocephin [Ceftriaxone Sodium] Hives  . Beta Adrenergic Blockers Rash   Patient Measurements: Height: 5\' 8"  (172.7 cm) Weight: 220 lb 14.4 oz (100.2 kg) IBW/kg (Calculated) : 68.4 Heparin Dosing Weight: 90Kg  Vital Signs: Temp: 97.8 F (36.6 C) (03/03 0400) Temp src: Oral (03/03 0400) BP: 66/47 mmHg (03/03 0700)  Labs:  Recent Labs  Mar 25, 2013 0543 03/25/2013 1400 03-25-2013 1540 04/07/2013 0100 04/08/2013 0500 03/11/2013 0733  HGB 11.7*  --   --   --  PATIENT IDENTIFICATION ERROR. PLEASE DISREGARD RESULTS. ACCOUNT WILL BE CREDITED. 11.6*  HCT 36.8*  --   --   --  PATIENT IDENTIFICATION ERROR. PLEASE DISREGARD RESULTS. ACCOUNT WILL BE CREDITED. 37.5*  PLT 274  --   --   --  PATIENT IDENTIFICATION ERROR. PLEASE DISREGARD RESULTS. ACCOUNT WILL BE CREDITED. 275  HEPARINUNFRC  --  <0.10*  --  0.23*  --  0.42  CREATININE 5.11*  --   --   --  PATIENT IDENTIFICATION ERROR. PLEASE DISREGARD RESULTS. ACCOUNT WILL BE CREDITED. 5.30*  TROPONINI 0.57*  --  1.06*  --  PATIENT IDENTIFICATION ERROR. PLEASE DISREGARD RESULTS. ACCOUNT WILL BE CREDITED. 1.77*   Estimated Creatinine Clearance: 15.3 ml/min (by C-G formula based on Cr of 5.3).  Assessment: 68yo male on heparin for r/o ACS. Cardiology consulted and suspects elevated troponin due to demand ischemia from low perfusion but cycling regimen to r/o MI.  Troponin continuing to trend higher this morning.  Heparin level 0.42 on IV heparin rate of 1400 units/hr.  No noted bleeding complications.  H/H and platelets are stable.  Plans for CVVHD today as indicated, will adjust IV antibiotics.  Goal of Therapy:  Heparin level 0.3-0.7 units/ml Monitor platelets by anticoagulation protocol: Yes   Plan:  1.  Continue current heparin rate of 1400 untis/hr. 2.  Will  f/u a 8 hour level to confirm. 3.  Daily heparin level/CBC 4.  Change Vancomycin to 1 gm IV every 24 hours  Rober Minion, PharmD., MS Clinical Pharmacist Pager:  [phone] Thank you for allowing pharmacy to be part of this patients care team. 03/29/2013,10:44 AM

## 2013-04-11 NOTE — Progress Notes (Signed)
RT Note- Patient expired and ventilator turned off.

## 2013-04-11 NOTE — Progress Notes (Signed)
CRITICAL VALUE ALERT  Critical value received: Trop 1.77, CO2 8  Date of notification:  04/10/2013  Time of notification:  0830  Critical value read back: yes  Nurse who received alert:  [id number]  MD notified (1st page):  Dr Earnest Conroy  Time of first page: 0830  MD notified (2nd page):  Time of second page:  Responding MD: Dr Earnest Conroy  Time MD responded:  [id number]

## 2013-04-11 NOTE — Progress Notes (Signed)
245 mcg of Fentanyl wasted in sink, witnessed by Cory Munch

## 2013-04-11 DEATH — deceased

## 2014-05-04 NOTE — Op Note (Signed)
PATIENT NAME:  Alex Petty, Alex Petty MR#:  [id number] DATE OF BIRTH:  July 21, 1945  DATE OF PROCEDURE:  02/08/2013  PREOPERATIVE DIAGNOSES: End-stage renal disease.   POSTOPERATIVE DIAGNOSES: End-stage renal disease.   PROCEDURE: Left radiocephalic AV fistula creation.   SURGEON: Algernon Huxley, MD  ASSISTANT: Melvyn Neth, PA-C   ANESTHESIA: General.   ESTIMATED BLOOD LOSS: 25 mL.   INDICATION FOR PROCEDURE: This is a 69 year old white male with end-stage renal disease and multiple other comorbidities. He needs permanent dialysis access and we are here to create a fistula .   DESCRIPTION OF PROCEDURE: The patient is brought to the operative suite, and after adequate level general anesthesia obtained, the left extremity was sterilely prepped and draped and a sterile surgical field was created. The incision was created between the palpable radial artery in the typical location of the cephalic vein.   The cephalic vein dissected out in the distal forearm was not usable for fistula creation so we extended our incision several centimeters more proximally where the vein was now 2 to 2.5 mm and at least patent and potentially usable for fistula creation. Multiple venous branches had to be ligated and divided between silk ties and the vein was marked for orientation. The artery was then dissected out and circled the vessels proximally and distally. The patient was heparinized with 3000 units of intravenous heparin and control was pulled up on the vessel loops.   The vein was ligated distally. An anterior wall arteriotomy was created with an 11-blade and extended with Potts scissors, and the vein was cut and beveled to an appropriate length to match the arteriotomy. We were able to pass up to 3 mm [name] dilators without difficulty. I then created an anastomosis with a running 6-0 Prolene in the usual fashion and two 6-0 Prolene patch sutures were used for hemostasis. The vessels flushed and de-aired prior  to release of control.   On release, there was flow within the fistula although some vasospasm was present. The wound was irrigated and Surgicel was placed. The wound was then closed with a running 3-0 Vicryl and a 4-0 Monocryl and Dermabond was placed as a dressing. The patient tolerated the procedure well and was taken the recovery room in stable condition.    ____________________________ Algernon Huxley, MD jsd:np D: 02/08/2013 17:12:22 ET T: 02/08/2013 22:29:33 ET JOB#: [id number]  cc: Algernon Huxley, MD, <Dictator> Allyn Kenner, MD  Algernon Huxley MD ELECTRONICALLY SIGNED 02/14/2013 17:21

## 2015-02-08 IMAGING — CR DG ABD PORTABLE 2V
3 series · 3 of 3 positions shown · non-contrast
Comparison: None

CLINICAL DATA: Vomiting.

PORTABLE ABDOMEN - 2 VIEW

[AP]
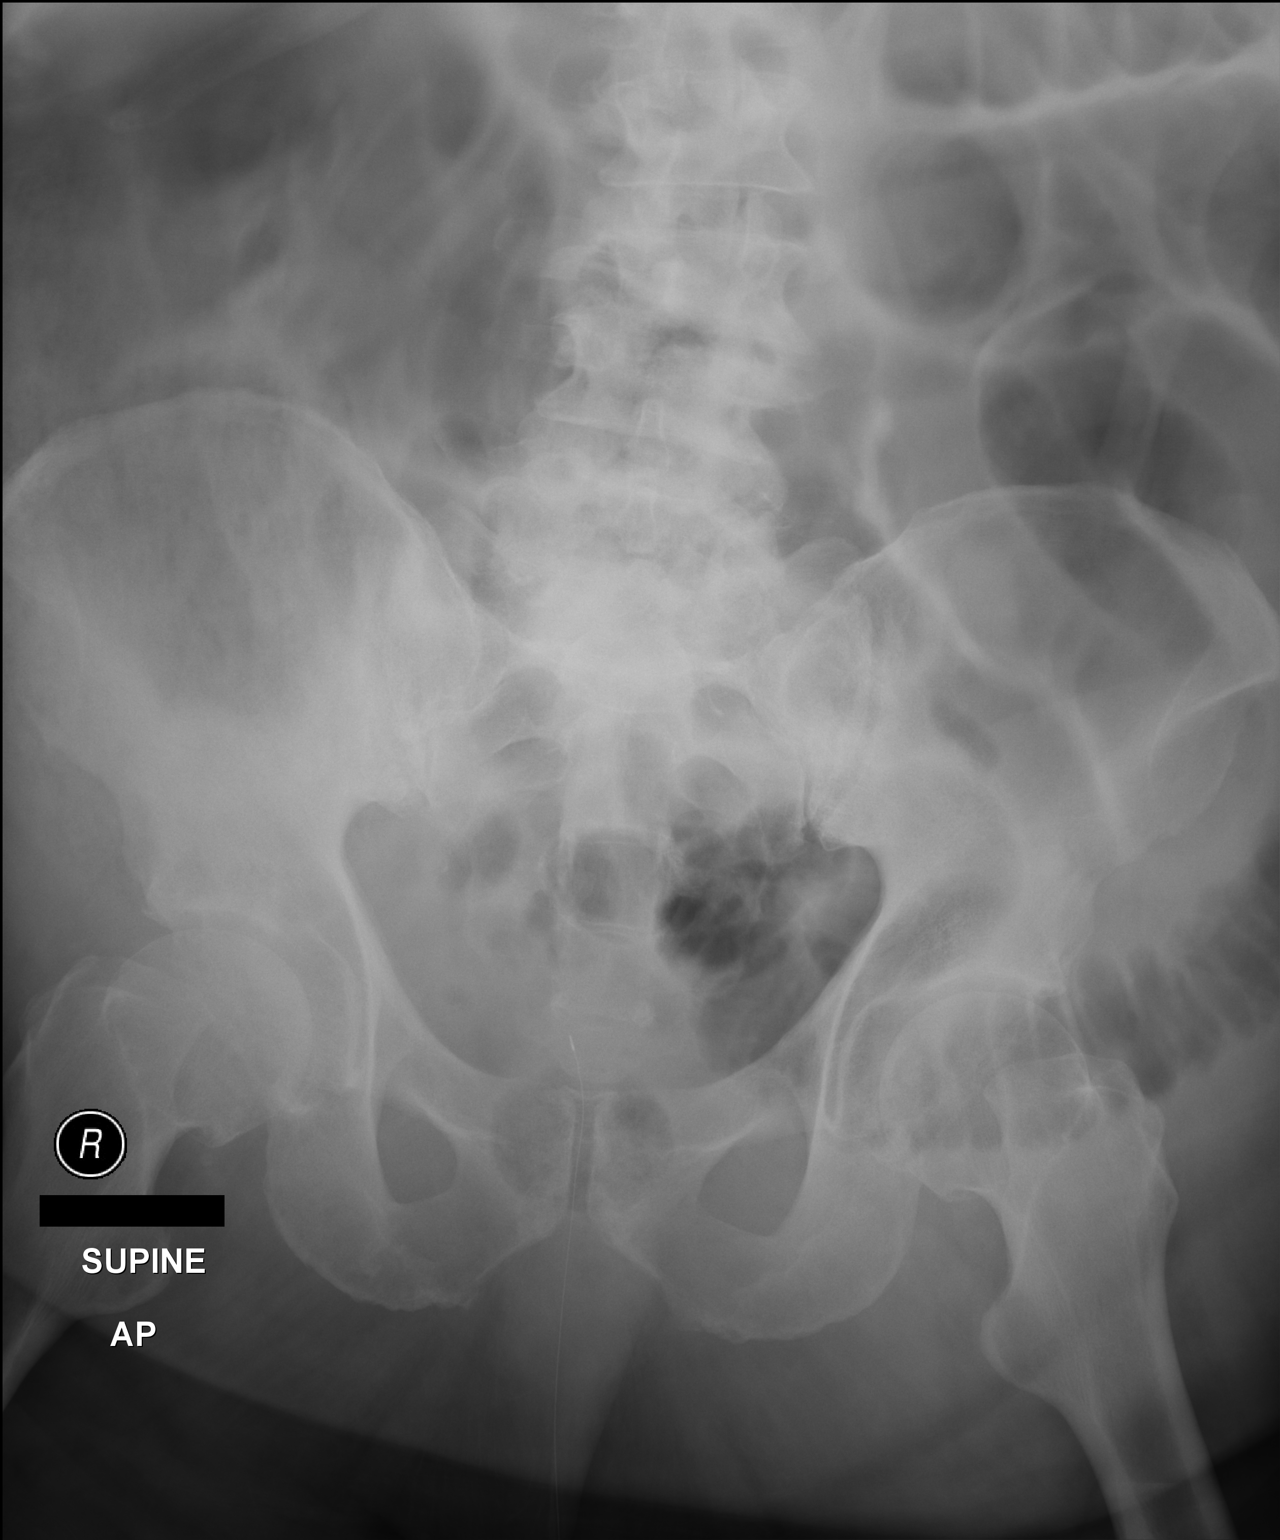

[ap lld]
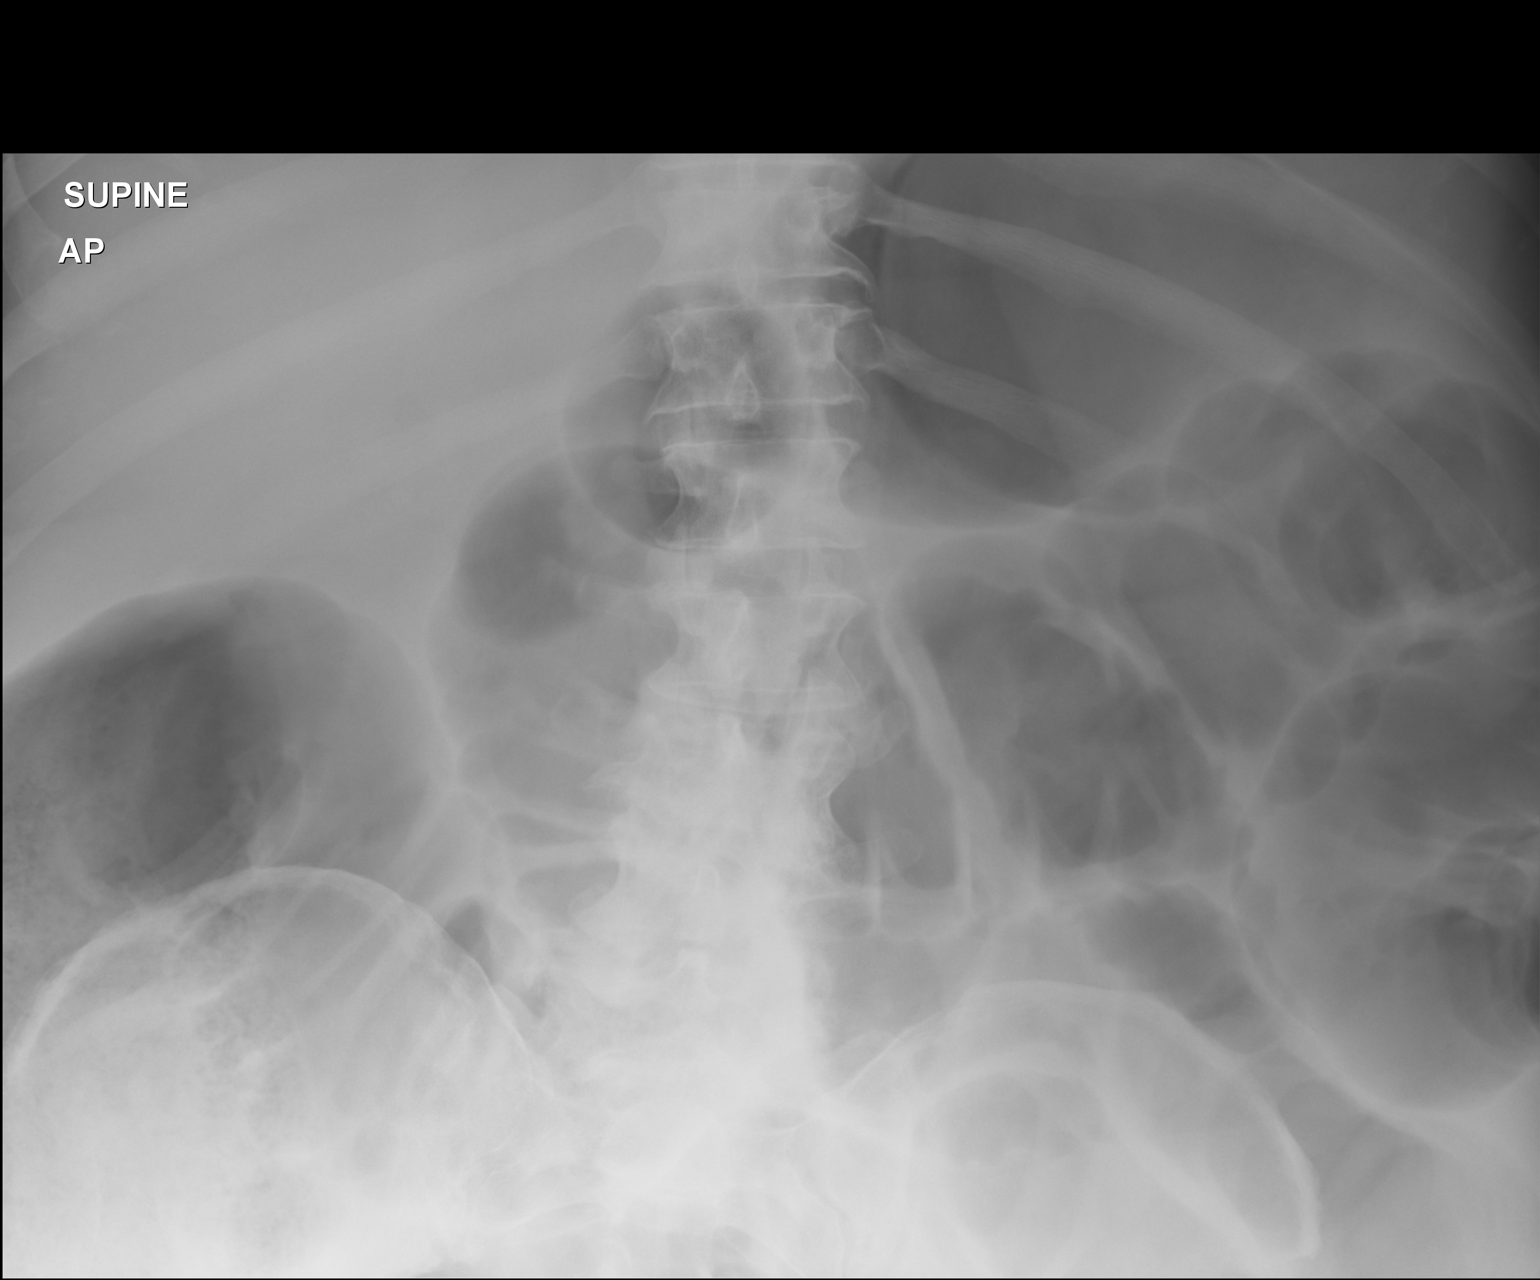

[pa lld]
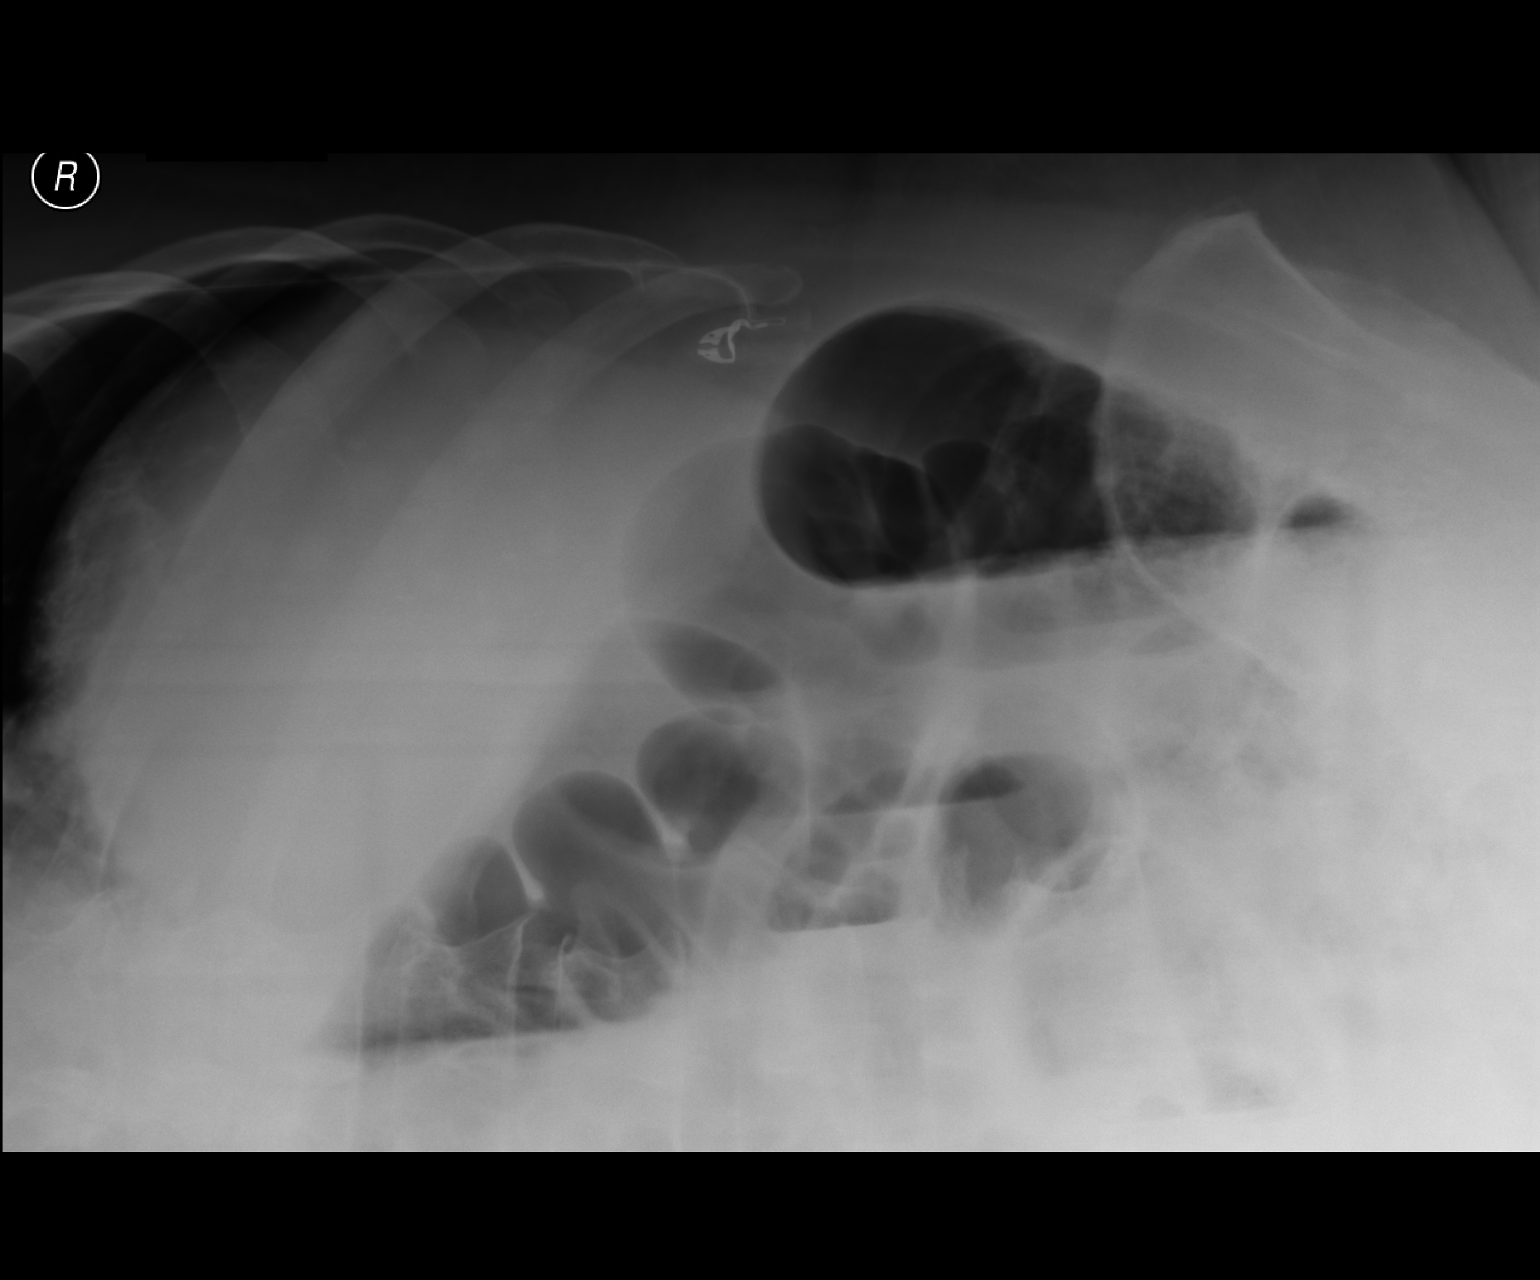

[3 of 3 positions shown; findings below may reference images not displayed]

FINDINGS: There is diffuse gaseous distention of both large and
small bowel.  I suspect represents ileus.  No free air.  No
organomegaly or suspicious calcification.  No acute bony
abnormality.
IMPRESSION: Diffuse gaseous distention of bowel, likely ileus.

## 2015-02-11 IMAGING — CR DG ABD PORTABLE 1V
2 series · 2 of 2 positions shown · non-contrast
Comparison: August 03, 2012

CLINICAL DATA: Ileus

PORTABLE ABDOMEN - 1 VIEW

[AP (1 of 2)]
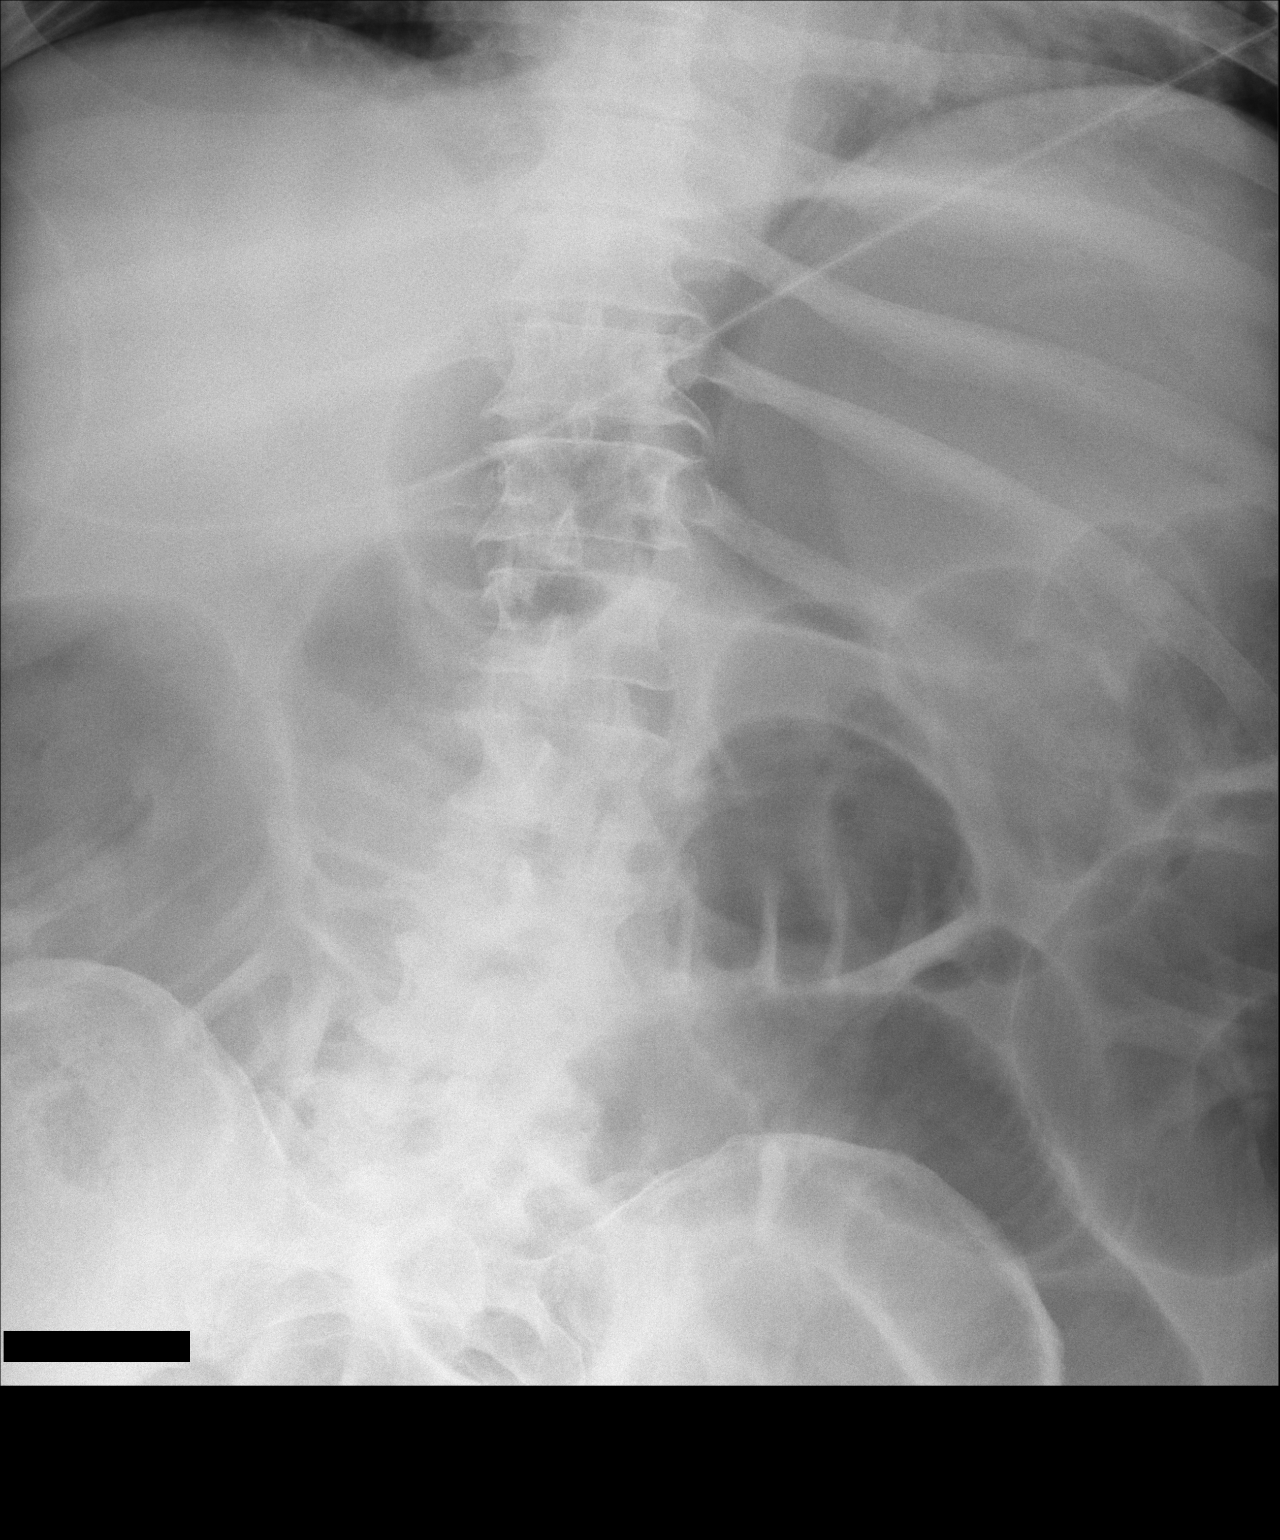

[AP (2 of 2)]
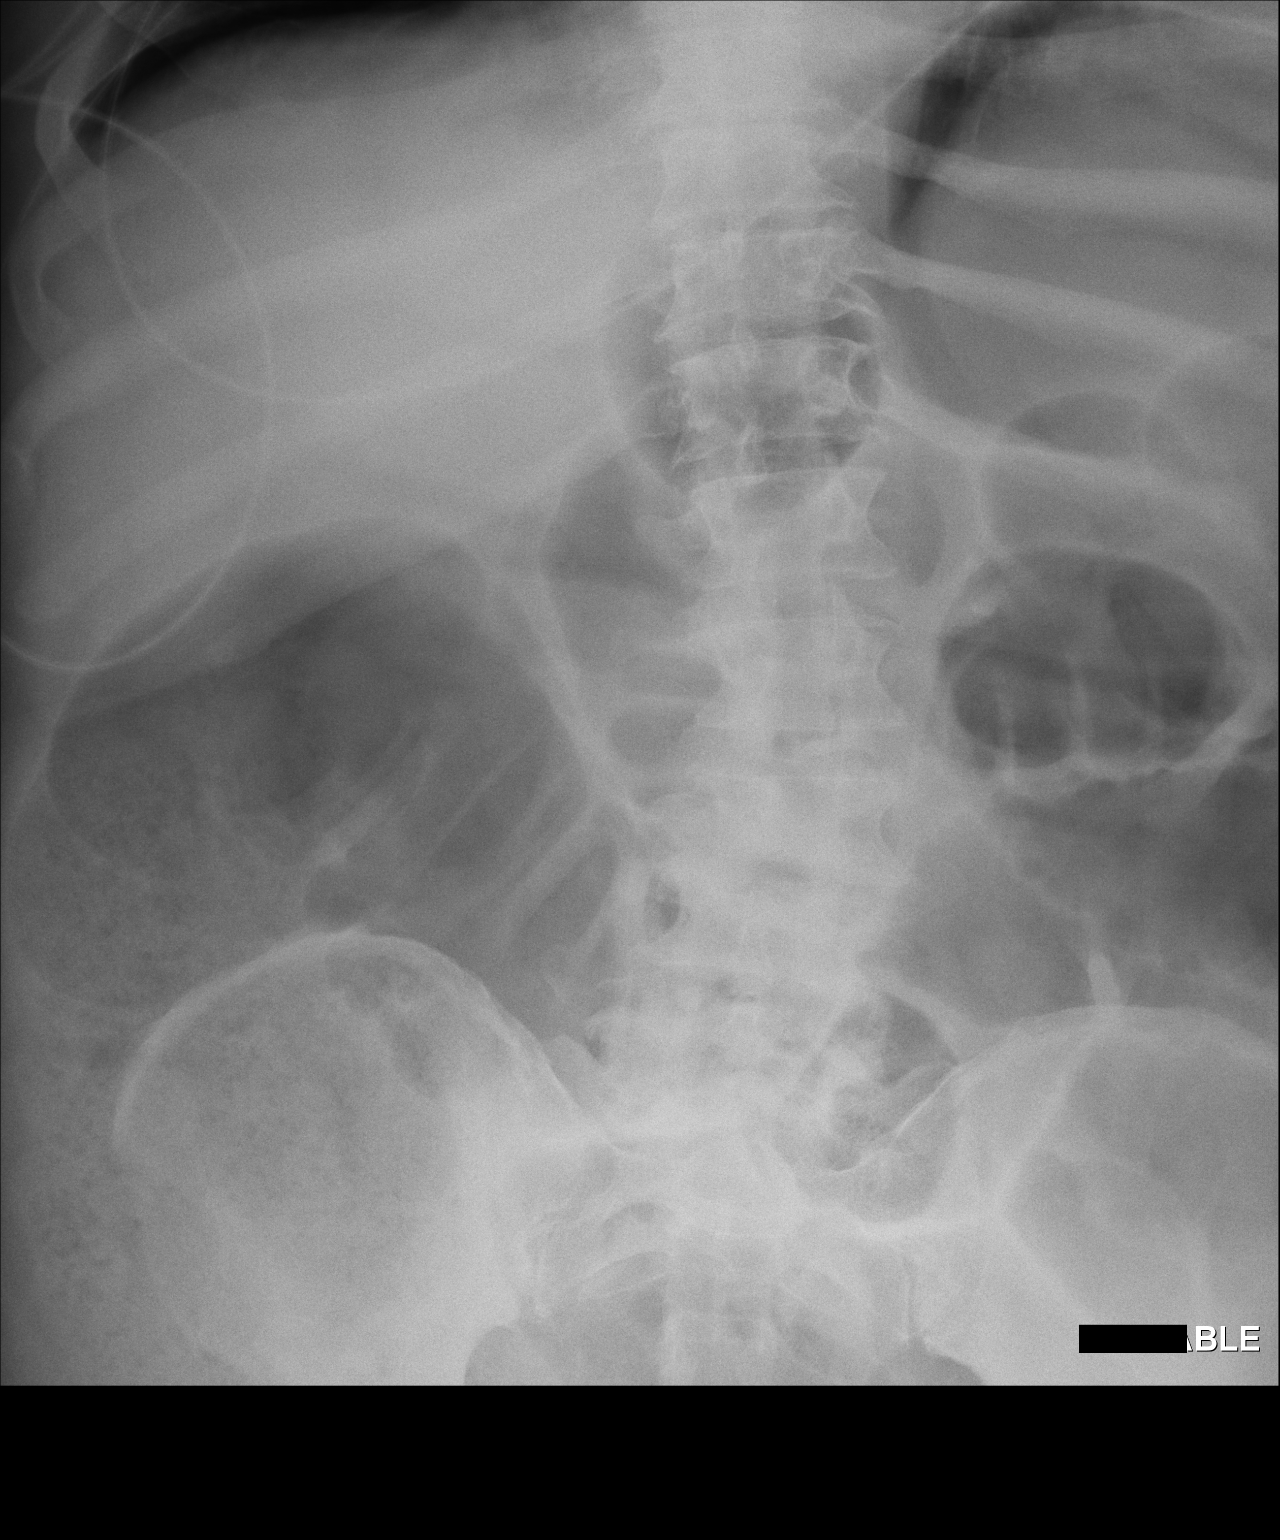

[2 of 2 positions shown; findings below may reference images not displayed]

FINDINGS: There is persistent generalized bowel dilatation with
small bowel loops slightly more dilated compared to recent prior
study.  No free air is seen on this supine examination.  There is
moderate stool in the colon.
IMPRESSION: Slight increase in generalized bowel dilatation.  Suspect ileus,
although a degree of obstruction is not excluded on this study.  No
free air is seen on this supine examination.
